# Patient Record
Sex: Male | Born: 1960
Health system: Southern US, Community
[De-identification: ages and names within clinical notes are randomized; demographics above are authoritative.]

## PROBLEM LIST (undated history)

## (undated) DIAGNOSIS — R55 Syncope and collapse: Secondary | ICD-10-CM

## (undated) DIAGNOSIS — I340 Nonrheumatic mitral (valve) insufficiency: Secondary | ICD-10-CM

## (undated) DIAGNOSIS — I4891 Unspecified atrial fibrillation: Secondary | ICD-10-CM

## (undated) DIAGNOSIS — I255 Ischemic cardiomyopathy: Secondary | ICD-10-CM

## (undated) DIAGNOSIS — Z720 Tobacco use: Secondary | ICD-10-CM

## (undated) DIAGNOSIS — I10 Essential (primary) hypertension: Secondary | ICD-10-CM

## (undated) DIAGNOSIS — I119 Hypertensive heart disease without heart failure: Secondary | ICD-10-CM

## (undated) DIAGNOSIS — K219 Gastro-esophageal reflux disease without esophagitis: Secondary | ICD-10-CM

## (undated) DIAGNOSIS — E118 Type 2 diabetes mellitus with unspecified complications: Secondary | ICD-10-CM

## (undated) DIAGNOSIS — E785 Hyperlipidemia, unspecified: Secondary | ICD-10-CM

## (undated) DIAGNOSIS — R51 Headache: Secondary | ICD-10-CM

## (undated) DIAGNOSIS — I5022 Chronic systolic (congestive) heart failure: Secondary | ICD-10-CM

## (undated) DIAGNOSIS — G4719 Other hypersomnia: Secondary | ICD-10-CM

## (undated) DIAGNOSIS — J9600 Acute respiratory failure, unspecified whether with hypoxia or hypercapnia: Secondary | ICD-10-CM

## (undated) DIAGNOSIS — I214 Non-ST elevation (NSTEMI) myocardial infarction: Secondary | ICD-10-CM

## (undated) DIAGNOSIS — R519 Headache, unspecified: Secondary | ICD-10-CM

## (undated) DIAGNOSIS — I4892 Unspecified atrial flutter: Secondary | ICD-10-CM

## (undated) DIAGNOSIS — J189 Pneumonia, unspecified organism: Secondary | ICD-10-CM

## (undated) DIAGNOSIS — M1990 Unspecified osteoarthritis, unspecified site: Secondary | ICD-10-CM

## (undated) DIAGNOSIS — I2510 Atherosclerotic heart disease of native coronary artery without angina pectoris: Secondary | ICD-10-CM

## (undated) HISTORY — DX: Other hypersomnia: G47.19

## (undated) HISTORY — PX: OTHER SURGICAL HISTORY: SHX169

## (undated) HISTORY — DX: Chronic systolic (congestive) heart failure: I50.22

## (undated) HISTORY — DX: Essential (primary) hypertension: I10

---

## 1999-01-24 ENCOUNTER — Emergency Department (HOSPITAL_COMMUNITY): Admission: EM | Admit: 1999-01-24 | Discharge: 1999-01-24 | Payer: Self-pay | Admitting: Emergency Medicine

## 1999-01-24 ENCOUNTER — Encounter: Payer: Self-pay | Admitting: Emergency Medicine

## 2005-04-06 ENCOUNTER — Ambulatory Visit: Payer: Self-pay | Admitting: Gastroenterology

## 2005-04-09 ENCOUNTER — Ambulatory Visit: Payer: Self-pay | Admitting: Internal Medicine

## 2005-04-17 ENCOUNTER — Emergency Department (HOSPITAL_COMMUNITY): Admission: EM | Admit: 2005-04-17 | Discharge: 2005-04-17 | Payer: Self-pay | Admitting: Emergency Medicine

## 2005-04-19 ENCOUNTER — Ambulatory Visit: Payer: Self-pay | Admitting: Internal Medicine

## 2005-05-04 ENCOUNTER — Ambulatory Visit: Payer: Self-pay | Admitting: Internal Medicine

## 2005-06-17 ENCOUNTER — Ambulatory Visit: Payer: Self-pay | Admitting: Gastroenterology

## 2005-06-17 ENCOUNTER — Ambulatory Visit: Payer: Self-pay | Admitting: Internal Medicine

## 2005-07-09 ENCOUNTER — Ambulatory Visit (HOSPITAL_COMMUNITY): Admission: RE | Admit: 2005-07-09 | Discharge: 2005-07-09 | Payer: Self-pay | Admitting: Gastroenterology

## 2005-07-21 ENCOUNTER — Ambulatory Visit: Payer: Self-pay | Admitting: Internal Medicine

## 2005-08-12 ENCOUNTER — Ambulatory Visit: Payer: Self-pay | Admitting: Gastroenterology

## 2005-09-01 ENCOUNTER — Ambulatory Visit: Payer: Self-pay | Admitting: Internal Medicine

## 2006-02-10 ENCOUNTER — Emergency Department (HOSPITAL_COMMUNITY): Admission: EM | Admit: 2006-02-10 | Discharge: 2006-02-10 | Payer: Self-pay | Admitting: Emergency Medicine

## 2006-02-21 ENCOUNTER — Emergency Department (HOSPITAL_COMMUNITY): Admission: EM | Admit: 2006-02-21 | Discharge: 2006-02-21 | Payer: Self-pay | Admitting: Family Medicine

## 2006-04-26 ENCOUNTER — Emergency Department (HOSPITAL_COMMUNITY): Admission: EM | Admit: 2006-04-26 | Discharge: 2006-04-26 | Payer: Self-pay | Admitting: Family Medicine

## 2006-05-14 ENCOUNTER — Emergency Department (HOSPITAL_COMMUNITY): Admission: EM | Admit: 2006-05-14 | Discharge: 2006-05-14 | Payer: Self-pay | Admitting: Family Medicine

## 2014-11-19 ENCOUNTER — Ambulatory Visit: Payer: Self-pay | Attending: Internal Medicine | Admitting: Internal Medicine

## 2014-11-19 ENCOUNTER — Encounter: Payer: Self-pay | Admitting: Internal Medicine

## 2014-11-19 VITALS — BP 150/90 | HR 100 | Temp 98.0°F | Resp 16 | Wt 200.0 lb

## 2014-11-19 DIAGNOSIS — Z23 Encounter for immunization: Secondary | ICD-10-CM | POA: Insufficient documentation

## 2014-11-19 DIAGNOSIS — F17200 Nicotine dependence, unspecified, uncomplicated: Secondary | ICD-10-CM

## 2014-11-19 DIAGNOSIS — Z720 Tobacco use: Secondary | ICD-10-CM | POA: Insufficient documentation

## 2014-11-19 DIAGNOSIS — IMO0001 Reserved for inherently not codable concepts without codable children: Secondary | ICD-10-CM

## 2014-11-19 DIAGNOSIS — E139 Other specified diabetes mellitus without complications: Secondary | ICD-10-CM

## 2014-11-19 DIAGNOSIS — K219 Gastro-esophageal reflux disease without esophagitis: Secondary | ICD-10-CM | POA: Insufficient documentation

## 2014-11-19 DIAGNOSIS — E785 Hyperlipidemia, unspecified: Secondary | ICD-10-CM

## 2014-11-19 DIAGNOSIS — R030 Elevated blood-pressure reading, without diagnosis of hypertension: Secondary | ICD-10-CM

## 2014-11-19 DIAGNOSIS — I10 Essential (primary) hypertension: Secondary | ICD-10-CM | POA: Insufficient documentation

## 2014-11-19 DIAGNOSIS — E119 Type 2 diabetes mellitus without complications: Secondary | ICD-10-CM | POA: Insufficient documentation

## 2014-11-19 LAB — POCT GLYCOSYLATED HEMOGLOBIN (HGB A1C): Hemoglobin A1C: 8

## 2014-11-19 LAB — GLUCOSE, POCT (MANUAL RESULT ENTRY): POC Glucose: 242 mg/dl — AB (ref 70–99)

## 2014-11-19 MED ORDER — HYDRALAZINE HCL 50 MG PO TABS: 50.0000 mg | ORAL_TABLET | Freq: Three times a day (TID) | ORAL | Status: DC

## 2014-11-19 MED ORDER — TRIAMTERENE-HCTZ 37.5-25 MG PO CAPS: 1.0000 | ORAL_CAPSULE | Freq: Every day | ORAL | Status: DC

## 2014-11-19 MED ORDER — OMEPRAZOLE 40 MG PO CPDR: 40.0000 mg | DELAYED_RELEASE_CAPSULE | Freq: Every day | ORAL | Status: DC

## 2014-11-19 MED ORDER — GLIPIZIDE 5 MG PO TABS: 5.0000 mg | ORAL_TABLET | Freq: Every day | ORAL | Status: DC

## 2014-11-19 MED ORDER — AMLODIPINE BESYLATE 10 MG PO TABS: 10.0000 mg | ORAL_TABLET | Freq: Every day | ORAL | Status: DC

## 2014-11-19 MED ORDER — CLONIDINE HCL 0.1 MG PO TABS
0.2000 mg | ORAL_TABLET | Freq: Once | ORAL | Status: AC
  Administered 2014-11-19: 0.2 mg via ORAL

## 2014-11-19 MED ORDER — LISINOPRIL 40 MG PO TABS: 40.0000 mg | ORAL_TABLET | Freq: Every day | ORAL | Status: DC

## 2014-11-19 NOTE — Progress Notes (Signed)
Patient Demographics  Sean Hinton, is a 54 y.o. male  QIO:[id number]  LKG:[id number]  DOB - 03-05-1961  CC:  Chief Complaint  Patient presents with  . Establish Care       HPI: Sean Hinton is a 54 y.o. male here today to establish medical care.patient has history of hypertension, diabetes, hyperlipidemia, GERD , as per patient is taking Glucotrol 2.5 mg daily, he reported his hemoglobin A1c was 8.8% about a year ago, currently his morbid A1c is 8.0%, he denies any hypoglycemic symptoms, today's blood pressure is elevated since he ran out of his blood pressure medications he reported to be on amlodipine lisinopril Dyazide, as per patient he used to be on clonidine which was switched to hydralazine, he is requesting refill on his medications. He denies any acute symptoms, does smoke cigarettes I have advised patient to quit smoking. Patient has No headache, No chest pain, No abdominal pain - No Nausea, No new weakness tingling or numbness, No Cough - SOB.  No Known Allergies Past Medical History  Diagnosis Date  . Diabetes mellitus without complication   . Hypertension    No current outpatient prescriptions on file prior to visit.   No current facility-administered medications on file prior to visit.   Family History  Problem Relation Age of Onset  . Diabetes Mother   . Hypertension Mother   . Heart disease Mother   . Cancer Mother     stomach cancer   . Diabetes Father   . Hypertension Father   . Heart disease Father   . Diabetes Sister   . Hypertension Sister   . Cancer Sister     breast cancer   . Diabetes Brother   . Hypertension Brother   . Heart disease Brother   . Stroke Brother    History   Social History  . Marital Status: Single    Spouse Name: N/A    Number of Children: N/A  . Years of Education: N/A   Occupational History  . Not on file.   Social History Main Topics  . Smoking status: Current Every Day Smoker -- 0.50 packs/day for 30  years  . Smokeless tobacco: Not on file  . Alcohol Use: No  . Drug Use: No     Comment: last use 1991  . Sexual Activity: Not on file   Other Topics Concern  . Not on file   Social History Narrative  . No narrative on file    Review of Systems: Constitutional: Negative for fever, chills, diaphoresis, activity change, appetite change and fatigue. HENT: Negative for ear pain, nosebleeds, congestion, facial swelling, rhinorrhea, neck pain, neck stiffness and ear discharge.  Eyes: Negative for pain, discharge, redness, itching and visual disturbance. Respiratory: Negative for cough, choking, chest tightness, shortness of breath, wheezing and stridor.  Cardiovascular: Negative for chest pain, palpitations and leg swelling. Gastrointestinal: Negative for abdominal distention. Genitourinary: Negative for dysuria, urgency, frequency, hematuria, flank pain, decreased urine volume, difficulty urinating and dyspareunia.  Musculoskeletal: Negative for back pain, joint swelling, arthralgia and gait problem. Neurological: Negative for dizziness, tremors, seizures, syncope, facial asymmetry, speech difficulty, weakness, light-headedness, numbness and headaches.  Hematological: Negative for adenopathy. Does not bruise/bleed easily. Psychiatric/Behavioral: Negative for hallucinations, behavioral problems, confusion, dysphoric mood, decreased concentration and agitation.    Objective:   Filed Vitals:   11/19/14 1054  BP: 150/90  Pulse:   Temp:   Resp:     Physical Exam: Constitutional: Patient appears well-developed and  well-nourished. No distress. HENT: Normocephalic, atraumatic, External right and left ear normal. Oropharynx is clear and moist.  Eyes: Conjunctivae and EOM are normal. PERRLA, no scleral icterus. Neck: Normal ROM. Neck supple. No JVD. No tracheal deviation. No thyromegaly. CVS: RRR, S1/S2 +, no murmurs, no gallops, no carotid bruit.  Pulmonary: Effort and breath sounds  normal, no stridor, rhonchi, wheezes, rales.  Abdominal: Soft. BS +, no distension, tenderness, rebound or guarding.  Musculoskeletal: Normal range of motion. No edema and no tenderness.  Neuro: Alert. Normal reflexes, muscle tone coordination. No cranial nerve deficit. Skin: Skin is warm and dry. No rash noted. Not diaphoretic. No erythema. No pallor. Psychiatric: Normal mood and affect. Behavior, judgment, thought content normal.  No results found for: WBC, HGB, HCT, MCV, PLT No results found for: CREATININE, BUN, NA, K, CL, CO2  Lab Results  Component Value Date   HGBA1C 8.0 11/19/2014   Lipid Panel  No results found for: CHOL, TRIG, HDL, CHOLHDL, VLDL, LDLCALC     Assessment and plan:   1. Other specified diabetes mellitus without complications Results for orders placed or performed in visit on 11/19/14  Glucose (CBG)  Result Value Ref Range   POC Glucose 242 (A) 70 - 99 mg/dl  HgB W8S  Result Value Ref Range   Hemoglobin A1C 8.0   I have advised patient to increase the dose of Glucotrol to 5 mg daily. Will repeat A1c in 3 months - Ambulatory referral to Ophthalmology - glipiZIDE (GLUCOTROL) 5 MG tablet; Take 1 tablet (5 mg total) by mouth daily before breakfast.  Dispense: 30 tablet; Refill: 3  2. Elevated blood pressure  - cloNIDine (CATAPRES) tablet 0.2 mg; Take 2 tablets (0.2 mg total) by mouth once. Repeat manual blood pressure is 150/90  3. Essential hypertension Advised patient for DASH diet, resume back on his medications, will Check blood chemistry  - triamterene-hydrochlorothiazide (DYAZIDE) 37.5-25 MG per capsule; Take 1 each (1 capsule total) by mouth daily.  Dispense: 30 capsule; Refill: 3 - lisinopril (PRINIVIL,ZESTRIL) 40 MG tablet; Take 1 tablet (40 mg total) by mouth daily.  Dispense: 30 tablet; Refill: 3 - amLODipine (NORVASC) 10 MG tablet; Take 1 tablet (10 mg total) by mouth daily.  Dispense: 30 tablet; Refill: 3 - hydrALAZINE (APRESOLINE) 50 MG  tablet; Take 1 tablet (50 mg total) by mouth 3 (three) times daily.  Dispense: 60 tablet; Refill: 3  4. Hyperlipidemia As per patient he used to be on Zocor in the past but he reported to that he start taking this medication for 3 months since his cholesterol level was better, will get fasting lipid panel on the next visit.  5. Smoking Advised patient to quit smoking.  6. Gastroesophageal reflux disease, esophagitis presence not specified Lifestyle modification, prescribed - omeprazole (PRILOSEC) 40 MG capsule; Take 1 capsule (40 mg total) by mouth daily.  Dispense: 30 capsule; Refill: 3  7. Need for prophylactic vaccination against Streptococcus pneumoniae (pneumococcus) Pneumovax given today.  Health Maintenance  -Vaccinations:  Patient declines flu shot pneumovax given today   Return in about 3 months (around 02/18/2015) for diabetes, hypertension, hyperipidemia, BP check in 2 weeks/Nurse Visit.   Doris Cheadle, MD

## 2014-11-19 NOTE — Progress Notes (Signed)
Patient here to establish care  Currently taking medication for HTN and Diabetes Presents if office with elevated blood pressure

## 2014-11-19 NOTE — Patient Instructions (Signed)
Diabetes Mellitus and Food It is important for you to manage your blood sugar (glucose) level. Your blood glucose level can be greatly affected by what you eat. Eating healthier foods in the appropriate amounts throughout the day at about the same time each day will help you control your blood glucose level. It can also help slow or prevent worsening of your diabetes mellitus. Healthy eating may even help you improve the level of your blood pressure and reach or maintain a healthy weight.  HOW CAN FOOD AFFECT ME? Carbohydrates Carbohydrates affect your blood glucose level more than any other type of food. Your dietitian will help you determine how many carbohydrates to eat at each meal and teach you how to count carbohydrates. Counting carbohydrates is important to keep your blood glucose at a healthy level, especially if you are using insulin or taking certain medicines for diabetes mellitus. Alcohol Alcohol can cause sudden decreases in blood glucose (hypoglycemia), especially if you use insulin or take certain medicines for diabetes mellitus. Hypoglycemia can be a life-threatening condition. Symptoms of hypoglycemia (sleepiness, dizziness, and disorientation) are similar to symptoms of having too much alcohol.  If your health care provider has given you approval to drink alcohol, do so in moderation and use the following guidelines:  Women should not have more than one drink per day, and men should not have more than two drinks per day. One drink is equal to:  12 oz of beer.  5 oz of wine.  1 oz of hard liquor.  Do not drink on an empty stomach.  Keep yourself hydrated. Have water, diet soda, or unsweetened iced tea.  Regular soda, juice, and other mixers might contain a lot of carbohydrates and should be counted. WHAT FOODS ARE NOT RECOMMENDED? As you make food choices, it is important to remember that all foods are not the same. Some foods have fewer nutrients per serving than other  foods, even though they might have the same number of calories or carbohydrates. It is difficult to get your body what it needs when you eat foods with fewer nutrients. Examples of foods that you should avoid that are high in calories and carbohydrates but low in nutrients include:  Trans fats (most processed foods list trans fats on the Nutrition Facts label).  Regular soda.  Juice.  Candy.  Sweets, such as cake, pie, doughnuts, and cookies.  Fried foods. WHAT FOODS CAN I EAT? Have nutrient-rich foods, which will nourish your body and keep you healthy. The food you should eat also will depend on several factors, including:  The calories you need.  The medicines you take.  Your weight.  Your blood glucose level.  Your blood pressure level.  Your cholesterol level. You also should eat a variety of foods, including:  Protein, such as meat, poultry, fish, tofu, nuts, and seeds (lean animal proteins are best).  Fruits.  Vegetables.  Dairy products, such as milk, cheese, and yogurt (low fat is best).  Breads, grains, pasta, cereal, rice, and beans.  Fats such as olive oil, trans fat-free margarine, canola oil, avocado, and olives. DOES EVERYONE WITH DIABETES MELLITUS HAVE THE SAME MEAL PLAN? Because every person with diabetes mellitus is different, there is not one meal plan that works for everyone. It is very important that you meet with a dietitian who will help you create a meal plan that is just right for you. Document Released: 07/08/2005 Document Revised: 10/16/2013 Document Reviewed: 09/07/2013 ExitCare Patient Information 2015 [employer]. This   information is not intended to replace advice given to you by your health care provider. Make sure you discuss any questions you have with your health care provider. DASH Eating Plan DASH stands for "Dietary Approaches to Stop Hypertension." The DASH eating plan is a healthy eating plan that has been shown to reduce high  blood pressure (hypertension). Additional health benefits may include reducing the risk of type 2 diabetes mellitus, heart disease, and stroke. The DASH eating plan may also help with weight loss. WHAT DO I NEED TO KNOW ABOUT THE DASH EATING PLAN? For the DASH eating plan, you will follow these general guidelines:  Choose foods with a percent daily value for sodium of less than 5% (as listed on the food label).  Use salt-free seasonings or herbs instead of table salt or sea salt.  Check with your health care provider or pharmacist before using salt substitutes.  Eat lower-sodium products, often labeled as "lower sodium" or "no salt added."  Eat fresh foods.  Eat more vegetables, fruits, and low-fat dairy products.  Choose whole grains. Look for the word "whole" as the first word in the ingredient list.  Choose fish and skinless chicken or turkey more often than red meat. Limit fish, poultry, and meat to 6 oz (170 g) each day.  Limit sweets, desserts, sugars, and sugary drinks.  Choose heart-healthy fats.  Limit cheese to 1 oz (28 g) per day.  Eat more home-cooked food and less restaurant, buffet, and fast food.  Limit fried foods.  Cook foods using methods other than frying.  Limit canned vegetables. If you do use them, rinse them well to decrease the sodium.  When eating at a restaurant, ask that your food be prepared with less salt, or no salt if possible. WHAT FOODS CAN I EAT? Seek help from a dietitian for individual calorie needs. Grains Whole grain or whole wheat bread. Brown rice. Whole grain or whole wheat pasta. Quinoa, bulgur, and whole grain cereals. Low-sodium cereals. Corn or whole wheat flour tortillas. Whole grain cornbread. Whole grain crackers. Low-sodium crackers. Vegetables Fresh or frozen vegetables (raw, steamed, roasted, or grilled). Low-sodium or reduced-sodium tomato and vegetable juices. Low-sodium or reduced-sodium tomato sauce and paste. Low-sodium  or reduced-sodium canned vegetables.  Fruits All fresh, canned (in natural juice), or frozen fruits. Meat and Other Protein Products Ground beef (85% or leaner), grass-fed beef, or beef trimmed of fat. Skinless chicken or turkey. Ground chicken or turkey. Pork trimmed of fat. All fish and seafood. Eggs. Dried beans, peas, or lentils. Unsalted nuts and seeds. Unsalted canned beans. Dairy Low-fat dairy products, such as skim or 1% milk, 2% or reduced-fat cheeses, low-fat ricotta or cottage cheese, or plain low-fat yogurt. Low-sodium or reduced-sodium cheeses. Fats and Oils Tub margarines without trans fats. Light or reduced-fat mayonnaise and salad dressings (reduced sodium). Avocado. Safflower, olive, or canola oils. Natural peanut or almond butter. Other Unsalted popcorn and pretzels. The items listed above may not be a complete list of recommended foods or beverages. Contact your dietitian for more options. WHAT FOODS ARE NOT RECOMMENDED? Grains White bread. White pasta. White rice. Refined cornbread. Bagels and croissants. Crackers that contain trans fat. Vegetables Creamed or fried vegetables. Vegetables in a cheese sauce. Regular canned vegetables. Regular canned tomato sauce and paste. Regular tomato and vegetable juices. Fruits Dried fruits. Canned fruit in light or heavy syrup. Fruit juice. Meat and Other Protein Products Fatty cuts of meat. Ribs, chicken wings, bacon, sausage, bologna, salami, chitterlings, fatback, hot   dogs, bratwurst, and packaged luncheon meats. Salted nuts and seeds. Canned beans with salt. Dairy Whole or 2% milk, cream, half-and-half, and cream cheese. Whole-fat or sweetened yogurt. Full-fat cheeses or blue cheese. Nondairy creamers and whipped toppings. Processed cheese, cheese spreads, or cheese curds. Condiments Onion and garlic salt, seasoned salt, table salt, and sea salt. Canned and packaged gravies. Worcestershire sauce. Tartar sauce. Barbecue sauce.  Teriyaki sauce. Soy sauce, including reduced sodium. Steak sauce. Fish sauce. Oyster sauce. Cocktail sauce. Horseradish. Ketchup and mustard. Meat flavorings and tenderizers. Bouillon cubes. Hot sauce. Tabasco sauce. Marinades. Taco seasonings. Relishes. Fats and Oils Butter, stick margarine, lard, shortening, ghee, and bacon fat. Coconut, palm kernel, or palm oils. Regular salad dressings. Other Pickles and olives. Salted popcorn and pretzels. The items listed above may not be a complete list of foods and beverages to avoid. Contact your dietitian for more information. WHERE CAN I FIND MORE INFORMATION? National Heart, Lung, and [employer] Institute: www.nhlbi.nih.gov/health/health-topics/topics/dash/ Document Released: 09/30/2011 Document Revised: 02/25/2014 Document Reviewed: 08/15/2013 ExitCare Patient Information 2015 [employer]. This information is not intended to replace advice given to you by your health care provider. Make sure you discuss any questions you have with your health care provider.  

## 2014-11-25 ENCOUNTER — Telehealth: Payer: Self-pay

## 2014-11-25 ENCOUNTER — Other Ambulatory Visit: Payer: Self-pay

## 2014-11-25 MED ORDER — PANTOPRAZOLE SODIUM 40 MG PO TBEC: 40.0000 mg | DELAYED_RELEASE_TABLET | Freq: Every day | ORAL | Status: DC

## 2014-11-25 NOTE — Telephone Encounter (Signed)
Patient calling to request protonix for his stomach issue States the other medication prescribed is not working [facility]

## 2014-12-19 ENCOUNTER — Emergency Department (HOSPITAL_COMMUNITY)
Admission: EM | Admit: 2014-12-19 | Discharge: 2014-12-19 | Disposition: A | Discharge status: Home / Self Care | Payer: Self-pay | Attending: Emergency Medicine | Admitting: Emergency Medicine

## 2014-12-19 ENCOUNTER — Encounter (HOSPITAL_COMMUNITY): Payer: Self-pay | Admitting: Emergency Medicine

## 2014-12-19 ENCOUNTER — Emergency Department (HOSPITAL_COMMUNITY): Payer: Self-pay

## 2014-12-19 ENCOUNTER — Encounter (HOSPITAL_COMMUNITY): Payer: Self-pay

## 2014-12-19 ENCOUNTER — Emergency Department (INDEPENDENT_AMBULATORY_CARE_PROVIDER_SITE_OTHER)
Admission: EM | Admit: 2014-12-19 | Discharge: 2014-12-19 | Disposition: A | Discharge status: Nursing Home (Includes ICF) | Payer: Self-pay | Source: Home / Self Care | Attending: Family Medicine | Admitting: Family Medicine

## 2014-12-19 ENCOUNTER — Telehealth: Payer: Self-pay | Admitting: Internal Medicine

## 2014-12-19 DIAGNOSIS — Z720 Tobacco use: Secondary | ICD-10-CM | POA: Insufficient documentation

## 2014-12-19 DIAGNOSIS — I10 Essential (primary) hypertension: Secondary | ICD-10-CM | POA: Insufficient documentation

## 2014-12-19 DIAGNOSIS — K5900 Constipation, unspecified: Secondary | ICD-10-CM | POA: Insufficient documentation

## 2014-12-19 DIAGNOSIS — Z7982 Long term (current) use of aspirin: Secondary | ICD-10-CM | POA: Insufficient documentation

## 2014-12-19 DIAGNOSIS — E119 Type 2 diabetes mellitus without complications: Secondary | ICD-10-CM | POA: Insufficient documentation

## 2014-12-19 DIAGNOSIS — R1013 Epigastric pain: Secondary | ICD-10-CM

## 2014-12-19 DIAGNOSIS — R630 Anorexia: Secondary | ICD-10-CM | POA: Insufficient documentation

## 2014-12-19 DIAGNOSIS — K219 Gastro-esophageal reflux disease without esophagitis: Secondary | ICD-10-CM | POA: Insufficient documentation

## 2014-12-19 DIAGNOSIS — Z79899 Other long term (current) drug therapy: Secondary | ICD-10-CM | POA: Insufficient documentation

## 2014-12-19 LAB — URINALYSIS, ROUTINE W REFLEX MICROSCOPIC
Bilirubin Urine: NEGATIVE
Glucose, UA: NEGATIVE mg/dL
Hgb urine dipstick: NEGATIVE
Ketones, ur: 15 mg/dL — AB
Leukocytes, UA: NEGATIVE
Nitrite: NEGATIVE
Protein, ur: 30 mg/dL — AB
Specific Gravity, Urine: 1.027 (ref 1.005–1.030)
UROBILINOGEN UA: 1 mg/dL (ref 0.0–1.0)
pH: 6.5 (ref 5.0–8.0)

## 2014-12-19 LAB — CBC WITH DIFFERENTIAL/PLATELET
Basophils Absolute: 0.1 10*3/uL (ref 0.0–0.1)
Basophils Relative: 1 % (ref 0–1)
EOS PCT: 2 % (ref 0–5)
Eosinophils Absolute: 0.2 10*3/uL (ref 0.0–0.7)
HEMATOCRIT: 45.9 % (ref 39.0–52.0)
Hemoglobin: 15.8 g/dL (ref 13.0–17.0)
LYMPHS PCT: 27 % (ref 12–46)
Lymphs Abs: 2.7 10*3/uL (ref 0.7–4.0)
MCH: 28.5 pg (ref 26.0–34.0)
MCHC: 34.4 g/dL (ref 30.0–36.0)
MCV: 82.7 fL (ref 78.0–100.0)
Monocytes Absolute: 0.7 10*3/uL (ref 0.1–1.0)
Monocytes Relative: 7 % (ref 3–12)
Neutro Abs: 6.6 10*3/uL (ref 1.7–7.7)
Neutrophils Relative %: 63 % (ref 43–77)
PLATELETS: 208 10*3/uL (ref 150–400)
RBC: 5.55 MIL/uL (ref 4.22–5.81)
RDW: 13.4 % (ref 11.5–15.5)
WBC: 10.3 10*3/uL (ref 4.0–10.5)

## 2014-12-19 LAB — COMPREHENSIVE METABOLIC PANEL
ALBUMIN: 4.1 g/dL (ref 3.5–5.2)
ALT: 15 U/L (ref 0–53)
AST: 20 U/L (ref 0–37)
Alkaline Phosphatase: 97 U/L (ref 39–117)
Anion gap: 11 (ref 5–15)
BUN: 16 mg/dL (ref 6–23)
CO2: 22 mmol/L (ref 19–32)
CREATININE: 1.07 mg/dL (ref 0.50–1.35)
Calcium: 9.5 mg/dL (ref 8.4–10.5)
Chloride: 104 mmol/L (ref 96–112)
GFR calc Af Amer: >90 mL/min (ref >90)
GFR calc non Af Amer: 79 mL/min — ABNORMAL LOW (ref >90)
Glucose, Bld: 146 mg/dL — ABNORMAL HIGH (ref 70–99)
Potassium: 3.7 mmol/L (ref 3.5–5.1)
SODIUM: 137 mmol/L (ref 135–145)
Total Bilirubin: 0.9 mg/dL (ref 0.3–1.2)
Total Protein: 7.8 g/dL (ref 6.0–8.3)

## 2014-12-19 LAB — URINE MICROSCOPIC-ADD ON

## 2014-12-19 LAB — LIPASE, BLOOD: Lipase: 24 U/L (ref 11–59)

## 2014-12-19 MED ORDER — GI COCKTAIL ~~LOC~~
30.0000 mL | Freq: Once | ORAL | Status: AC
  Administered 2014-12-19: 30 mL via ORAL
  Filled 2014-12-19: qty 30

## 2014-12-19 MED ORDER — HYDROMORPHONE HCL 1 MG/ML IJ SOLN
1.0000 mg | Freq: Once | INTRAMUSCULAR | Status: AC
Start: 2014-12-19 — End: 2014-12-19
  Administered 2014-12-19: 1 mg via INTRAVENOUS
  Filled 2014-12-19: qty 1

## 2014-12-19 MED ORDER — SUCRALFATE 1 GM/10ML PO SUSP: 1.0000 g | Freq: Three times a day (TID) | ORAL | Status: DC

## 2014-12-19 MED ORDER — SODIUM CHLORIDE 0.9 % IV BOLUS (SEPSIS)
1000.0000 mL | Freq: Once | INTRAVENOUS | Status: AC
  Administered 2014-12-19: 1000 mL via INTRAVENOUS

## 2014-12-19 MED ORDER — ONDANSETRON HCL 4 MG/2ML IJ SOLN
4.0000 mg | Freq: Once | INTRAMUSCULAR | Status: AC
Start: 2014-12-19 — End: 2014-12-19
  Administered 2014-12-19: 4 mg via INTRAVENOUS
  Filled 2014-12-19: qty 2

## 2014-12-19 MED ORDER — IOHEXOL 300 MG/ML  SOLN
100.0000 mL | Freq: Once | INTRAMUSCULAR | Status: AC | PRN
  Administered 2014-12-19: 100 mL via INTRAVENOUS

## 2014-12-19 MED ORDER — HYDROCODONE-ACETAMINOPHEN 5-325 MG PO TABS: 1.0000 | ORAL_TABLET | ORAL | Status: DC | PRN

## 2014-12-19 NOTE — Discharge Instructions (Signed)
Gastroesophageal Reflux Disease, Adult Gastroesophageal reflux disease (GERD) happens when acid from your stomach flows up into the esophagus. When acid comes in contact with the esophagus, the acid causes soreness (inflammation) in the esophagus. Over time, GERD may create small holes (ulcers) in the lining of the esophagus. CAUSES   Increased body weight. This puts pressure on the stomach, making acid rise from the stomach into the esophagus.  Smoking. This increases acid production in the stomach.  Drinking alcohol. This causes decreased pressure in the lower esophageal sphincter (valve or ring of muscle between the esophagus and stomach), allowing acid from the stomach into the esophagus.  Late evening meals and a full stomach. This increases pressure and acid production in the stomach.  A malformed lower esophageal sphincter. Sometimes, no cause is found. SYMPTOMS   Burning pain in the lower part of the mid-chest behind the breastbone and in the mid-stomach area. This may occur twice a week or more often.  Trouble swallowing.  Sore throat.  Dry cough.  Asthma-like symptoms including chest tightness, shortness of breath, or wheezing. DIAGNOSIS  Your caregiver may be able to diagnose GERD based on your symptoms. In some cases, X-rays and other tests may be done to check for complications or to check the condition of your stomach and esophagus. TREATMENT  Your caregiver may recommend over-the-counter or prescription medicines to help decrease acid production. Ask your caregiver before starting or adding any new medicines.  HOME CARE INSTRUCTIONS   Change the factors that you can control. Ask your caregiver for guidance concerning weight loss, quitting smoking, and alcohol consumption.  Avoid foods and drinks that make your symptoms worse, such as:  Caffeine or alcoholic drinks.  Chocolate.  Peppermint or mint flavorings.  Garlic and onions.  Spicy foods.  Citrus fruits,  such as oranges, lemons, or limes.  Tomato-based foods such as sauce, chili, salsa, and pizza.  Fried and fatty foods.  Avoid lying down for the 3 hours prior to your bedtime or prior to taking a nap.  Eat small, frequent meals instead of large meals.  Wear loose-fitting clothing. Do not wear anything tight around your waist that causes pressure on your stomach.  Raise the head of your bed 6 to 8 inches with wood blocks to help you sleep. Extra pillows will not help.  Only take over-the-counter or prescription medicines for pain, discomfort, or fever as directed by your caregiver.  Do not take aspirin, ibuprofen, or other nonsteroidal anti-inflammatory drugs (NSAIDs). SEEK IMMEDIATE MEDICAL CARE IF:   You have pain in your arms, neck, jaw, teeth, or back.  Your pain increases or changes in intensity or duration.  You develop nausea, vomiting, or sweating (diaphoresis).  You develop shortness of breath, or you faint.  Your vomit is green, yellow, black, or looks like coffee grounds or blood.  Your stool is red, bloody, or black. These symptoms could be signs of other problems, such as heart disease, gastric bleeding, or esophageal bleeding. MAKE SURE YOU:   Understand these instructions.  Will watch your condition.  Will get help right away if you are not doing well or get worse. Document Released: 07/21/2005 Document Revised: 01/03/2012 Document Reviewed: 04/30/2011 ExitCare Patient Information 2015 [employer]. This information is not intended to replace advice given to you by your health care provider. Make sure you discuss any questions you have with your health care provider.  

## 2014-12-19 NOTE — ED Provider Notes (Signed)
Sean Hinton is a 54 y.o. male who presents to Urgent Care today for epigastric abdominal pain. Patient has a history of gastritis confirmed with upper endoscopy in [place]. Over the past 2-3 months he has noted worsening epigastric abdominal pain. He describes a dull squeezing sensation in his epigastric abdominal area. The pain is been worsening over the past 2 weeks or so. However today the pain became exquisitely painful after eating a meal. He notes severe pain. He denies any chest pain or palpitations or shortness of breath. He takes both pantoprazole, and omeprazole. He's also been taking Aleve, Tylenol, and ibuprofen for pain recently.   Past Medical History  Diagnosis Date  . Diabetes mellitus without complication   . Hypertension    Past Surgical History  Procedure Laterality Date  . Right thumb surgery      History  Substance Use Topics  . Smoking status: Current Every Day Smoker -- 0.50 packs/day for 30 years  . Smokeless tobacco: Not on file  . Alcohol Use: No   ROS as above Medications: No current facility-administered medications for this encounter.   Current Outpatient Prescriptions  Medication Sig Dispense Refill  . amLODipine (NORVASC) 10 MG tablet Take 1 tablet (10 mg total) by mouth daily. 30 tablet 3  . aspirin 81 MG tablet Take 81 mg by mouth daily.    . cloNIDine (CATAPRES) 0.3 MG tablet Take 0.3 mg by mouth 3 (three) times daily.    Marland Kitchen glipiZIDE (GLUCOTROL) 5 MG tablet Take 1 tablet (5 mg total) by mouth daily before breakfast. 30 tablet 3  . lisinopril (PRINIVIL,ZESTRIL) 40 MG tablet Take 1 tablet (40 mg total) by mouth daily. 30 tablet 3  . omeprazole (PRILOSEC) 40 MG capsule Take 1 capsule (40 mg total) by mouth daily. 30 capsule 3  . pantoprazole (PROTONIX) 40 MG tablet Take 1 tablet (40 mg total) by mouth daily. 30 tablet 3  . triamterene-hydrochlorothiazide (DYAZIDE) 37.5-25 MG per capsule Take 1 each (1 capsule total) by mouth daily. 30 capsule 3  .  hydrALAZINE (APRESOLINE) 50 MG tablet Take 1 tablet (50 mg total) by mouth 3 (three) times daily. 60 tablet 3   No Known Allergies   Exam:  BP 176/105 mmHg  Pulse 86  Temp(Src) 98.2 F (36.8 C) (Oral)  Resp 18  SpO2 97% Gen: Well NAD HEENT: EOMI,  MMM Lungs: Normal work of breathing. CTABL Heart: RRR no MRG Abd: NABS, hypoactive bowel sounds. Significantly tender in his epigastric area. Pain radiates to his epigastric area with palpation in the inferior portion of his abdomen. He also displays guarding and rebound. Exts: Brisk capillary refill, warm and well perfused.   Twelve-lead EKG: Normal sinus rhythm at 82 bpm. No significant Q waves. Leads II, III, aVF with 75mm ST depression. No ST elevation. Inverted T waves in the lateral leads.  QTc 406.   No results found for this or any previous visit (from the past 24 hour(s)). No results found.  Assessment and Plan: 54 y.o. male with severe acute on chronic abdominal pain. This is concerning for perforated gastric or duodenal ulcer.  Differential includes pancreatitis, gastritis, or gallstone and cardiac etiologies. Transfer to the emergency department for evaluation and management.  Patient declines EMS transport.  Discussed warning signs or symptoms. Please see discharge instructions. Patient expresses understanding.     Rodolph Bong, MD 12/19/14 [phone]

## 2014-12-19 NOTE — ED Notes (Signed)
Pt c/o epigastric pain that has been present for several months and has progressively gotten worse.  Pt sent from [facility] for r/o perforated ulcer.

## 2014-12-19 NOTE — Telephone Encounter (Signed)
Patients girlfriend called to speak to nurse about patients stomach pain. She states that the patient is not eating, and is not having regular bowel movements. She also states that he has started vomiting. Please f/u with pt.

## 2014-12-19 NOTE — ED Notes (Signed)
C/o epigastric pain.  On 1/116.  Symptoms gradually getting worse.  No relief with otc pain meds or prescribed meds omeprazole or protonix.

## 2014-12-19 NOTE — ED Provider Notes (Signed)
Point of epigastric pain for several months. Worse with eating. He vomited one time yesterday. He admits to flecks of red blood mixed with stool last time a few days ago. On exam alert nontoxic Glasgow Coma Score 15 HEENT exam extremities moist pink not icteric neck supple trachea midline lungs clear auscultation heart regular rate and rhythm abdomen nondistended normal active bowel sounds tender at epigastrium no guarding rigidity or rebound  Doug Sou, MD 12/19/14 2048

## 2014-12-19 NOTE — ED Notes (Signed)
Patient transported to CT 

## 2014-12-19 NOTE — ED Provider Notes (Signed)
CSN: [id number]     Arrival date & time 12/19/14  1429 History   First MD Initiated Contact with Patient 12/19/14 1605     Chief Complaint  Patient presents with  . Abdominal Pain     (Consider location/radiation/quality/duration/timing/severity/associated sxs/prior Treatment) HPI Comments: 54 yo M hx of HTN, DMII, gastric ulcers, presents with CC abdominal pain.    Patient is a 53 y.o. male presenting with abdominal pain. The history is provided by the patient. No language interpreter was used.  Abdominal Pain Pain location:  Epigastric Pain quality: aching and sharp   Pain radiates to:  Does not radiate Pain severity:  Moderate Onset quality:  Gradual Timing:  Constant Progression:  Worsening Chronicity:  Recurrent Context: eating   Context: not alcohol use, not diet changes, not previous surgeries, not recent illness, not recent travel, not retching, not sick contacts, not suspicious food intake and not trauma   Relieved by:  Nothing Worsened by:  Eating Ineffective treatments:  NSAIDs (PPI) Associated symptoms: anorexia, constipation and nausea   Associated symptoms: no chest pain, no chills, no cough, no diarrhea, no dysuria, no fever, no hematemesis, no hematochezia, no hematuria, no melena, no shortness of breath and no vomiting   Nausea:    Severity:  Moderate   Onset quality:  Gradual   Duration:  3 days   Timing:  Intermittent   Progression:  Unchanged Risk factors: NSAID use     Past Medical History  Diagnosis Date  . Diabetes mellitus without complication   . Hypertension    Past Surgical History  Procedure Laterality Date  . Right thumb surgery      Family History  Problem Relation Age of Onset  . Diabetes Mother   . Hypertension Mother   . Heart disease Mother   . Cancer Mother     stomach cancer   . Diabetes Father   . Hypertension Father   . Heart disease Father   . Diabetes Sister   . Hypertension Sister   . Cancer Sister     breast cancer    . Diabetes Brother   . Hypertension Brother   . Heart disease Brother   . Stroke Brother    History  Substance Use Topics  . Smoking status: Current Every Day Smoker -- 0.50 packs/day for 30 years  . Smokeless tobacco: Not on file  . Alcohol Use: No    Review of Systems  Constitutional: Negative for fever and chills.  Respiratory: Negative for cough and shortness of breath.   Cardiovascular: Negative for chest pain.  Gastrointestinal: Positive for nausea, abdominal pain, constipation and anorexia. Negative for vomiting, diarrhea, melena, hematochezia, abdominal distention and hematemesis.  Genitourinary: Negative for dysuria and hematuria.  Musculoskeletal: Negative for myalgias.  Skin: Negative for rash.  Neurological: Negative for dizziness, weakness, light-headedness, numbness and headaches.  Hematological: Negative for adenopathy. Does not bruise/bleed easily.  All other systems reviewed and are negative.     Allergies  Review of patient's allergies indicates no known allergies.  Home Medications   Prior to Admission medications   Medication Sig Start Date End Date Taking? Authorizing Provider  amLODipine (NORVASC) 10 MG tablet Take 1 tablet (10 mg total) by mouth daily. 11/19/14  Yes Doris Cheadle, MD  aspirin 81 MG tablet Take 81 mg by mouth daily.   Yes Historical Provider, MD  glipiZIDE (GLUCOTROL) 5 MG tablet Take 1 tablet (5 mg total) by mouth daily before breakfast. 11/19/14  Yes Doris Cheadle, MD  hydrALAZINE (APRESOLINE) 50 MG tablet Take 1 tablet (50 mg total) by mouth 3 (three) times daily. Patient taking differently: Take 50 mg by mouth 2 (two) times daily.  11/19/14  Yes Doris Cheadle, MD  lisinopril (PRINIVIL,ZESTRIL) 40 MG tablet Take 1 tablet (40 mg total) by mouth daily. 11/19/14  Yes Doris Cheadle, MD  omeprazole (PRILOSEC) 40 MG capsule Take 1 capsule (40 mg total) by mouth daily. 11/19/14  Yes Deepak Advani, MD  pantoprazole (PROTONIX) 40 MG tablet Take  1 tablet (40 mg total) by mouth daily. 11/25/14  Yes Doris Cheadle, MD  triamterene-hydrochlorothiazide (DYAZIDE) 37.5-25 MG per capsule Take 1 each (1 capsule total) by mouth daily. 11/19/14  Yes Deepak Advani, MD   BP 177/114 mmHg  Pulse 89  Temp(Src) 98.3 F (36.8 C) (Oral)  Resp 20  Wt 184 lb (83.462 kg)  SpO2 97% Physical Exam  Constitutional: He is oriented to person, place, and time. He appears well-developed and well-nourished.  HENT:  Head: Normocephalic and atraumatic.  Right Ear: External ear normal.  Left Ear: External ear normal.  Mouth/Throat: Oropharynx is clear and moist.  Eyes: Conjunctivae and EOM are normal. Pupils are equal, round, and reactive to light.  Neck: Normal range of motion. Neck supple.  Cardiovascular: Normal rate, regular rhythm, normal heart sounds and intact distal pulses.   Pulmonary/Chest: Effort normal and breath sounds normal. No respiratory distress. He has no wheezes. He has no rales. He exhibits no tenderness.  Abdominal: Soft. Bowel sounds are normal. He exhibits no distension and no mass. There is tenderness. There is no rebound and no guarding.  Epigastric TTP, with some generalized TTP as well, soft, no guarding, no rebound  Musculoskeletal: Normal range of motion.  Neurological: He is alert and oriented to person, place, and time.  Skin: Skin is warm and dry.  Nursing note and vitals reviewed.   ED Course  Procedures (including critical care time) Labs Review Labs Reviewed  COMPREHENSIVE METABOLIC PANEL - Abnormal; Notable for the following:    Glucose, Bld 146 (*)    GFR calc non Af Amer 79 (*)    All other components within normal limits  URINALYSIS, ROUTINE W REFLEX MICROSCOPIC - Abnormal; Notable for the following:    Ketones, ur 15 (*)    Protein, ur 30 (*)    All other components within normal limits  CBC WITH DIFFERENTIAL/PLATELET  LIPASE, BLOOD  URINE MICROSCOPIC-ADD ON    Imaging Review Ct Abdomen Pelvis W  Contrast  12/19/2014   CLINICAL DATA:  Upper mid abdominal pain and right upper quadrant pain since the beginning of the year. Nausea and vomiting yesterday. No bowel movement since Tuesday.  EXAM: CT ABDOMEN AND PELVIS WITH CONTRAST  TECHNIQUE: Multidetector CT imaging of the abdomen and pelvis was performed using the standard protocol following bolus administration of intravenous contrast.  CONTRAST:  OMNIPAQUE IOHEXOL 300 MG/ML  SOLN  COMPARISON:  MRI abdomen 07/09/2005  FINDINGS: Motion artifact in the lung bases.  Mild dependent atelectasis.  Liver, spleen, gallbladder, adrenal glands, kidneys, abdominal aorta, inferior vena cava, and retroperitoneal lymph nodes are unremarkable. There is slight hazy infiltration around the celiac axis and hit/ body of the pancreas. This could represent early changes of acute pancreatitis. Correlation with laboratory markers is recommended. No pancreatic ductal dilatation. No pancreatic mass appreciated. Stomach, small bowel, and colon are not abnormally distended. Stool fills the colon. No free air or free fluid in the abdomen. Abdominal wall musculature appears intact.  Pelvis: Appendix is normal. Prostate gland is enlarged. Bladder wall is thickened, possibly due to infection or under distention. No free or loculated pelvic fluid collections. No pelvic mass or lymphadenopathy. Mild degenerative changes in the spine and hips. No destructive bone lesions.  IMPRESSION: Suggestion of infiltration around the head of the pancreas which may represent early changes of pancreatitis. Correlation with laboratory markers recommended. No evidence of bowel obstruction.   Electronically Signed   By: Burman Nieves M.D.   On: 12/19/2014 18:57     EKG Interpretation None      MDM   Final diagnoses:  Gastroesophageal reflux disease, esophagitis presence not specified   54 yo M hx of HTN, DMII, gastric ulcers, presents with CC abdominal pain.  Pt seen at [facility] and there was  concern for perforated gastric ulcer given pt's history, and worsening abdominal pain.    Physical exam as above.  VS WNL.  Pt with mostly epigastric TTP, with some diffuse tenderness as well, but abdomen is not peritonitic.  Labs demonstrate CBC, CMP, Lipase, UA all WNL.    CT abdomen/pelvis demonstrates some infiltration around head of pancreas which may represent early changes of pancreatitis.  However lipase is WNL, thus unlikely.   Diagnosis of epigastric pain, likely 2/2 GERD.   D/c home in good condition.  Rx carafate, Norco.  Recommended that pt d/c NSAIDs.  F/u with PCP in 1 week, and GI for further evaluation.  Return precautions given.  Pt understands and agrees with plan.   Jon Gills  Discussed pt with my attending Dr. Ethelda Chick.     Jon Gills, MD 12/20/14 1601  Doug Sou, MD 12/21/14 [phone]

## 2014-12-23 ENCOUNTER — Telehealth: Payer: Self-pay

## 2014-12-23 DIAGNOSIS — R109 Unspecified abdominal pain: Secondary | ICD-10-CM

## 2014-12-23 DIAGNOSIS — K219 Gastro-esophageal reflux disease without esophagitis: Secondary | ICD-10-CM

## 2014-12-23 MED ORDER — RANITIDINE HCL 150 MG PO TABS: 150.0000 mg | ORAL_TABLET | Freq: Two times a day (BID) | ORAL | Status: DC

## 2014-12-23 NOTE — Telephone Encounter (Signed)
dexilant can be ordered but it is not on our formulary in the pharmacy. If patient has a Probation officer it can be sent there and Prilosec removed from med list but he will have to pay out of pocket,  No way for [place] to know how much.   I advise patient try zantac 150 mg BID, sent to on site pharmacy. $4.  D/cd prilosec Quit smoking which worsens GERD.

## 2014-12-23 NOTE — Telephone Encounter (Signed)
Returned patient phone call Patient states was seen in the ED and it was recommended that he see A GI doctor for his stomach issues Referral to GI placed in epic

## 2014-12-23 NOTE — Telephone Encounter (Signed)
Patient states the protonix is not helping  Patient is requesting a prescription for dexalant Is this something we can prescribe

## 2014-12-24 ENCOUNTER — Telehealth: Payer: Self-pay | Admitting: Internal Medicine

## 2014-12-24 DIAGNOSIS — K219 Gastro-esophageal reflux disease without esophagitis: Secondary | ICD-10-CM

## 2014-12-24 MED ORDER — DEXLANSOPRAZOLE 30 MG PO CPDR: 30.0000 mg | DELAYED_RELEASE_CAPSULE | Freq: Every day | ORAL | Status: DC

## 2014-12-24 NOTE — Telephone Encounter (Signed)
Ordered dexilant rx printed bc it is not on formulary here and patient will need to get it from a different pharmacy. Francena Hanly, please call the pharmacy to cancel Protonix and zantac Rx.

## 2014-12-24 NOTE — Telephone Encounter (Signed)
Pt had a lot of nausea last night and vomited due to his condition. He is wondering if he could try a different medication that is not Zantac... Zantac was not helpful in the past but will try it once again. In the future he would like to be prescribed dexilant. Please call for advise about his nausea.

## 2014-12-24 NOTE — Addendum Note (Signed)
Addended by: Dessa Phi on: 12/24/2014 05:14 PM   Modules accepted: Orders

## 2014-12-25 NOTE — Telephone Encounter (Signed)
Pt aware of Rx need to be pick up Protonix and Zantac cancelled

## 2014-12-26 ENCOUNTER — Ambulatory Visit: Payer: Self-pay | Attending: Internal Medicine

## 2014-12-26 ENCOUNTER — Telehealth: Payer: Self-pay

## 2014-12-26 NOTE — Telephone Encounter (Signed)
Returned patient phone call Patient already received a call from the pharmacy And call was satisfied

## 2014-12-30 ENCOUNTER — Other Ambulatory Visit: Payer: Self-pay | Admitting: *Deleted

## 2014-12-30 DIAGNOSIS — K219 Gastro-esophageal reflux disease without esophagitis: Secondary | ICD-10-CM

## 2014-12-30 MED ORDER — DEXLANSOPRAZOLE 30 MG PO CPDR: 30.0000 mg | DELAYED_RELEASE_CAPSULE | Freq: Every day | ORAL | Status: AC

## 2015-01-17 ENCOUNTER — Ambulatory Visit: Payer: Self-pay | Attending: Internal Medicine

## 2015-02-20 ENCOUNTER — Encounter (HOSPITAL_COMMUNITY): Payer: Self-pay | Admitting: Emergency Medicine

## 2015-02-20 ENCOUNTER — Emergency Department (HOSPITAL_COMMUNITY): Payer: Self-pay

## 2015-02-20 ENCOUNTER — Inpatient Hospital Stay (HOSPITAL_COMMUNITY)
Admission: EM | Admit: 2015-02-20 | Discharge: 2015-02-25 | DRG: 287 | Disposition: A | Discharge status: Home / Self Care | Payer: No Typology Code available for payment source | Attending: Cardiovascular Disease | Admitting: Cardiovascular Disease

## 2015-02-20 DIAGNOSIS — I340 Nonrheumatic mitral (valve) insufficiency: Secondary | ICD-10-CM | POA: Diagnosis present

## 2015-02-20 DIAGNOSIS — Z720 Tobacco use: Secondary | ICD-10-CM

## 2015-02-20 DIAGNOSIS — E119 Type 2 diabetes mellitus without complications: Secondary | ICD-10-CM

## 2015-02-20 DIAGNOSIS — I2510 Atherosclerotic heart disease of native coronary artery without angina pectoris: Secondary | ICD-10-CM | POA: Diagnosis present

## 2015-02-20 DIAGNOSIS — Z833 Family history of diabetes mellitus: Secondary | ICD-10-CM

## 2015-02-20 DIAGNOSIS — I4892 Unspecified atrial flutter: Secondary | ICD-10-CM | POA: Diagnosis present

## 2015-02-20 DIAGNOSIS — I480 Paroxysmal atrial fibrillation: Secondary | ICD-10-CM | POA: Diagnosis present

## 2015-02-20 DIAGNOSIS — E785 Hyperlipidemia, unspecified: Secondary | ICD-10-CM | POA: Diagnosis present

## 2015-02-20 DIAGNOSIS — I255 Ischemic cardiomyopathy: Secondary | ICD-10-CM

## 2015-02-20 DIAGNOSIS — E1165 Type 2 diabetes mellitus with hyperglycemia: Secondary | ICD-10-CM | POA: Diagnosis present

## 2015-02-20 DIAGNOSIS — I119 Hypertensive heart disease without heart failure: Secondary | ICD-10-CM | POA: Diagnosis present

## 2015-02-20 DIAGNOSIS — E139 Other specified diabetes mellitus without complications: Secondary | ICD-10-CM

## 2015-02-20 DIAGNOSIS — I10 Essential (primary) hypertension: Secondary | ICD-10-CM | POA: Diagnosis present

## 2015-02-20 DIAGNOSIS — Z7982 Long term (current) use of aspirin: Secondary | ICD-10-CM

## 2015-02-20 DIAGNOSIS — I4891 Unspecified atrial fibrillation: Secondary | ICD-10-CM

## 2015-02-20 DIAGNOSIS — R55 Syncope and collapse: Secondary | ICD-10-CM

## 2015-02-20 DIAGNOSIS — I2582 Chronic total occlusion of coronary artery: Secondary | ICD-10-CM | POA: Diagnosis present

## 2015-02-20 DIAGNOSIS — Z79899 Other long term (current) drug therapy: Secondary | ICD-10-CM

## 2015-02-20 DIAGNOSIS — F17210 Nicotine dependence, cigarettes, uncomplicated: Secondary | ICD-10-CM | POA: Diagnosis present

## 2015-02-20 HISTORY — DX: Nonrheumatic mitral (valve) insufficiency: I34.0

## 2015-02-20 HISTORY — DX: Ischemic cardiomyopathy: I25.5

## 2015-02-20 HISTORY — DX: Unspecified atrial fibrillation: I48.91

## 2015-02-20 HISTORY — DX: Type 2 diabetes mellitus with unspecified complications: E11.8

## 2015-02-20 HISTORY — DX: Syncope and collapse: R55

## 2015-02-20 HISTORY — DX: Unspecified atrial flutter: I48.92

## 2015-02-20 HISTORY — DX: Hyperlipidemia, unspecified: E78.5

## 2015-02-20 HISTORY — DX: Tobacco use: Z72.0

## 2015-02-20 HISTORY — DX: Atherosclerotic heart disease of native coronary artery without angina pectoris: I25.10

## 2015-02-20 HISTORY — DX: Hypertensive heart disease without heart failure: I11.9

## 2015-02-20 LAB — CBC WITH DIFFERENTIAL/PLATELET
BASOS PCT: 1 % (ref 0–1)
Basophils Absolute: 0 10*3/uL (ref 0.0–0.1)
Eosinophils Absolute: 0.1 10*3/uL (ref 0.0–0.7)
Eosinophils Relative: 1 % (ref 0–5)
HEMATOCRIT: 45.1 % (ref 39.0–52.0)
Hemoglobin: 15.9 g/dL (ref 13.0–17.0)
LYMPHS ABS: 2.8 10*3/uL (ref 0.7–4.0)
LYMPHS PCT: 32 % (ref 12–46)
MCH: 29.3 pg (ref 26.0–34.0)
MCHC: 35.3 g/dL (ref 30.0–36.0)
MCV: 83.1 fL (ref 78.0–100.0)
MONO ABS: 0.7 10*3/uL (ref 0.1–1.0)
MONOS PCT: 8 % (ref 3–12)
NEUTROS PCT: 58 % (ref 43–77)
Neutro Abs: 5 10*3/uL (ref 1.7–7.7)
Platelets: 333 10*3/uL (ref 150–400)
RBC: 5.43 MIL/uL (ref 4.22–5.81)
RDW: 14.7 % (ref 11.5–15.5)
WBC: 8.6 10*3/uL (ref 4.0–10.5)

## 2015-02-20 LAB — I-STAT CHEM 8, ED
BUN: 18 mg/dL (ref 6–23)
CALCIUM ION: 1.15 mmol/L (ref 1.12–1.23)
CHLORIDE: 105 mmol/L (ref 96–112)
Creatinine, Ser: 1 mg/dL (ref 0.50–1.35)
Glucose, Bld: 322 mg/dL — ABNORMAL HIGH (ref 70–99)
HEMATOCRIT: 48 % (ref 39.0–52.0)
Hemoglobin: 16.3 g/dL (ref 13.0–17.0)
Potassium: 3.5 mmol/L (ref 3.5–5.1)
SODIUM: 143 mmol/L (ref 135–145)
TCO2: 21 mmol/L (ref 0–100)

## 2015-02-20 LAB — BASIC METABOLIC PANEL
Anion gap: 8 (ref 5–15)
BUN: 14 mg/dL (ref 6–23)
CO2: 23 mmol/L (ref 19–32)
CREATININE: 1.2 mg/dL (ref 0.50–1.35)
Calcium: 8.7 mg/dL (ref 8.4–10.5)
Chloride: 110 mmol/L (ref 96–112)
GFR, EST AFRICAN AMERICAN: 79 mL/min — AB (ref >90)
GFR, EST NON AFRICAN AMERICAN: 68 mL/min — AB (ref >90)
GLUCOSE: 167 mg/dL — AB (ref 70–99)
Potassium: 3.6 mmol/L (ref 3.5–5.1)
Sodium: 141 mmol/L (ref 135–145)

## 2015-02-20 LAB — TROPONIN I
Troponin I: 0.06 ng/mL — ABNORMAL HIGH (ref <0.031)
Troponin I: 0.06 ng/mL — ABNORMAL HIGH (ref <0.031)

## 2015-02-20 LAB — POTASSIUM: Potassium: 3.6 mmol/L (ref 3.5–5.1)

## 2015-02-20 LAB — GLUCOSE, CAPILLARY: GLUCOSE-CAPILLARY: 149 mg/dL — AB (ref 70–99)

## 2015-02-20 LAB — I-STAT TROPONIN, ED: TROPONIN I, POC: 0.04 ng/mL (ref 0.00–0.08)

## 2015-02-20 LAB — MAGNESIUM: Magnesium: 2 mg/dL (ref 1.5–2.5)

## 2015-02-20 MED ORDER — ACETAMINOPHEN 325 MG PO TABS
650.0000 mg | ORAL_TABLET | ORAL | Status: DC | PRN
  Administered 2015-02-21: 650 mg via ORAL
  Filled 2015-02-20: qty 2

## 2015-02-20 MED ORDER — SODIUM BICARBONATE 8.4 % IV SOLN
50.0000 meq | Freq: Once | INTRAVENOUS | Status: AC
  Administered 2015-02-20: 50 meq via INTRAVENOUS
  Filled 2015-02-20: qty 50

## 2015-02-20 MED ORDER — ADENOSINE 6 MG/2ML IV SOLN
12.0000 mg | Freq: Once | INTRAVENOUS | Status: AC
Start: 2015-02-20 — End: 2015-02-20
  Administered 2015-02-20: 12 mg via INTRAVENOUS

## 2015-02-20 MED ORDER — OFF THE BEAT BOOK
Freq: Once | Status: AC
  Administered 2015-02-21
  Filled 2015-02-20: qty 1

## 2015-02-20 MED ORDER — RIVAROXABAN 20 MG PO TABS
20.0000 mg | ORAL_TABLET | Freq: Every day | ORAL | Status: DC
  Administered 2015-02-20: 20 mg via ORAL
  Filled 2015-02-20: qty 1

## 2015-02-20 MED ORDER — DILTIAZEM LOAD VIA INFUSION
20.0000 mg | Freq: Once | INTRAVENOUS | Status: AC
  Administered 2015-02-20: 20 mg via INTRAVENOUS

## 2015-02-20 MED ORDER — SODIUM CHLORIDE 0.9 % IV BOLUS (SEPSIS)
1000.0000 mL | Freq: Once | INTRAVENOUS | Status: AC
  Administered 2015-02-20: 1000 mL via INTRAVENOUS

## 2015-02-20 MED ORDER — TRIAMTERENE-HCTZ 37.5-25 MG PO CAPS
1.0000 | ORAL_CAPSULE | Freq: Every day | ORAL | Status: DC
  Administered 2015-02-21 – 2015-02-23 (×2): 1 via ORAL
  Filled 2015-02-20 (×5): qty 1

## 2015-02-20 MED ORDER — ADENOSINE 6 MG/2ML IV SOLN
INTRAVENOUS | Status: AC
  Filled 2015-02-20: qty 8

## 2015-02-20 MED ORDER — INSULIN ASPART 100 UNIT/ML ~~LOC~~ SOLN
5.0000 [IU] | Freq: Once | SUBCUTANEOUS | Status: AC
  Administered 2015-02-20: 5 [IU] via INTRAVENOUS
  Filled 2015-02-20: qty 1

## 2015-02-20 MED ORDER — DILTIAZEM HCL 100 MG IV SOLR
5.0000 mg/h | INTRAVENOUS | Status: DC
  Administered 2015-02-20: 5 mg/h via INTRAVENOUS

## 2015-02-20 MED ORDER — HYDRALAZINE HCL 50 MG PO TABS
50.0000 mg | ORAL_TABLET | Freq: Three times a day (TID) | ORAL | Status: DC
  Administered 2015-02-20 – 2015-02-21 (×4): 50 mg via ORAL
  Filled 2015-02-20 (×4): qty 1

## 2015-02-20 MED ORDER — LISINOPRIL 40 MG PO TABS
40.0000 mg | ORAL_TABLET | Freq: Every day | ORAL | Status: DC
  Administered 2015-02-21 – 2015-02-25 (×5): 40 mg via ORAL
  Filled 2015-02-20 (×5): qty 1

## 2015-02-20 MED ORDER — DEXTROSE 50 % IV SOLN
1.0000 | Freq: Once | INTRAVENOUS | Status: AC
  Administered 2015-02-20: 50 mL via INTRAVENOUS
  Filled 2015-02-20: qty 50

## 2015-02-20 MED ORDER — ONDANSETRON HCL 4 MG/2ML IJ SOLN: 4.0000 mg | Freq: Four times a day (QID) | INTRAMUSCULAR | Status: DC | PRN

## 2015-02-20 MED ORDER — DILTIAZEM HCL 60 MG PO TABS
60.0000 mg | ORAL_TABLET | Freq: Four times a day (QID) | ORAL | Status: DC
  Administered 2015-02-20 – 2015-02-21 (×4): 60 mg via ORAL
  Filled 2015-02-20 (×4): qty 1

## 2015-02-20 MED ORDER — GLIPIZIDE 5 MG PO TABS
5.0000 mg | ORAL_TABLET | Freq: Every day | ORAL | Status: DC
  Administered 2015-02-21 – 2015-02-22 (×2): 5 mg via ORAL
  Filled 2015-02-20 (×2): qty 1

## 2015-02-20 NOTE — ED Provider Notes (Signed)
CSN: [id number]     Arrival date & time 02/20/15  1402 History   First MD Initiated Contact with Patient 02/20/15 1406     No chief complaint on file.    The history is provided by the patient. No language interpreter was used.   Mr. Sean Hinton presents for evaluation of syncope.  He was at work this afternoon and felt flushed after lunch and syncopized.  He hit his upper back when he passed out.  No head injury.  He was unconscious for less than 30 seconds.  EMS found him to be in a rapid heart rate.  He received adenosine 6mg , 12mg  with no change in his rhythm.  He then received cardizem 20 mg and his rate improved to 84 transiently only to increase again.  He has no similar sxs previously.  He denies any chest pain, SOB, diaphoresis, nausea, vomiting.   Past Medical History  Diagnosis Date  . Diabetes mellitus without complication   . Hypertension    Past Surgical History  Procedure Laterality Date  . Right thumb surgery      Family History  Problem Relation Age of Onset  . Diabetes Mother   . Hypertension Mother   . Heart disease Mother   . Cancer Mother     stomach cancer   . Diabetes Father   . Hypertension Father   . Heart disease Father   . Diabetes Sister   . Hypertension Sister   . Cancer Sister     breast cancer   . Diabetes Brother   . Hypertension Brother   . Heart disease Brother   . Stroke Brother    History  Substance Use Topics  . Smoking status: Current Every Day Smoker -- 0.50 packs/day for 30 years  . Smokeless tobacco: Not on file  . Alcohol Use: No    Review of Systems  All other systems reviewed and are negative.     Allergies  Review of patient's allergies indicates no known allergies.  Home Medications   Prior to Admission medications   Medication Sig Start Date End Date Taking? Authorizing Provider  amLODipine (NORVASC) 10 MG tablet Take 1 tablet (10 mg total) by mouth daily. 11/19/14   Doris Cheadle, MD  aspirin 81 MG tablet Take 81 mg  by mouth daily.    Historical Provider, MD  Dexlansoprazole (DEXILANT) 30 MG capsule Take 1 capsule (30 mg total) by mouth daily. 12/30/14   Josalyn Funches, MD  glipiZIDE (GLUCOTROL) 5 MG tablet Take 1 tablet (5 mg total) by mouth daily before breakfast. 11/19/14   Doris Cheadle, MD  hydrALAZINE (APRESOLINE) 50 MG tablet Take 1 tablet (50 mg total) by mouth 3 (three) times daily. Patient taking differently: Take 50 mg by mouth 2 (two) times daily.  11/19/14   Doris Cheadle, MD  HYDROcodone-acetaminophen (NORCO/VICODIN) 5-325 MG per tablet Take 1 tablet by mouth every 4 (four) hours as needed. 12/19/14   Jon Gills, MD  lisinopril (PRINIVIL,ZESTRIL) 40 MG tablet Take 1 tablet (40 mg total) by mouth daily. 11/19/14   Doris Cheadle, MD  sucralfate (CARAFATE) 1 GM/10ML suspension Take 10 mLs (1 g total) by mouth 4 (four) times daily -  with meals and at bedtime. 12/19/14   Jon Gills, MD  triamterene-hydrochlorothiazide (DYAZIDE) 37.5-25 MG per capsule Take 1 each (1 capsule total) by mouth daily. 11/19/14   Doris Cheadle, MD   BP 155/106 mmHg  Pulse 182  Temp(Src) 98.4 F (36.9 C) (Oral)  Resp 21  SpO2 96% Physical Exam  Constitutional: He is oriented to person, place, and time. He appears well-developed and well-nourished.  HENT:  Head: Normocephalic and atraumatic.  Cardiovascular:  No murmur heard. Tachycardic and irregular  Pulmonary/Chest: Effort normal and breath sounds normal. No respiratory distress.  Abdominal: Soft. There is no tenderness. There is no rebound and no guarding.  Musculoskeletal: He exhibits no edema or tenderness.  Neurological: He is alert and oriented to person, place, and time.  Skin: Skin is warm and dry.  Psychiatric: He has a normal mood and affect. His behavior is normal.  Nursing note and vitals reviewed.   ED Course  Procedures (including critical care time) CRITICAL CARE Performed by: Tilden Fossa   Total critical care time: 30 minutes  Critical care  time was exclusive of separately billable procedures and treating other patients.  Critical care was necessary to treat or prevent imminent or life-threatening deterioration.  Critical care was time spent personally by me on the following activities: development of treatment plan with patient and/or surrogate as well as nursing, discussions with consultants, evaluation of patient's response to treatment, examination of patient, obtaining history from patient or surrogate, ordering and performing treatments and interventions, ordering and review of laboratory studies, ordering and review of radiographic studies, pulse oximetry and re-evaluation of patient's condition.  Labs Review Labs Reviewed  I-STAT CHEM 8, ED - Abnormal; Notable for the following:    Glucose, Bld 322 (*)    All other components within normal limits  CBC WITH DIFFERENTIAL/PLATELET  BASIC METABOLIC PANEL  MAGNESIUM  TROPONIN I  TROPONIN I  TROPONIN I  POTASSIUM  I-STAT TROPOININ, ED    Imaging Review Dg Chest Port 1 View  02/20/2015   CLINICAL DATA:  Syncope.  EXAM: PORTABLE CHEST - 1 VIEW  COMPARISON:  None.  FINDINGS: The heart size and mediastinal contours are within normal limits. Both lungs are clear. No pneumothorax or pleural effusion is noted. The visualized skeletal structures are unremarkable.  IMPRESSION: No acute cardiopulmonary abnormality seen.   Electronically Signed   By: Lupita Raider, M.D.   On: 02/20/2015 15:38     EKG Interpretation   Date/Time:  Thursday February 20 2015 15:23:02 EDT Ventricular Rate:  90 PR Interval:  139 QRS Duration: 77 QT Interval:  365 QTC Calculation: 447 R Axis:   41 Text Interpretation:  Sinus rhythm Atrial premature complex LAE, consider  biatrial enlargement q waves in V1, V2 Baseline wander in lead(s) V1  Confirmed by Lincoln Brigham [phone]) on 02/20/2015 3:42:38 PM      MDM   Final diagnoses:  Atrial fibrillation and flutter   patient here for evaluation of  syncopal events. Patient is noted to be an tachycardic rhythm, most likely atrial flutter, question A. Fib. Attempted Identicard 1 in the ED without any change in his rhythm. Patient did receive a done a card twice prior to ED arrival. Patient given a second diltiazem bolus of 20 mg in the ED and started on a drip as well. Awaiting lab results patient did convert to a sinus rhythm while he was on the diltiazem drip. Patient has not had any chest pain or shortness of breath in the ED. He did have an i-STAT chem 8 with a potassium of greater than 8.5, question hyperkalemia but patient does not have a reason to be hyperkalemic. Patient was given some temporizing measures for hyperkalemia. Later is found that the lab was hemolyzed. Repeat BMP is pending. Cardiology was consulted  prior to conversion to sinus rhythm.      Tilden Fossa, MD 02/20/15 [phone]

## 2015-02-20 NOTE — ED Notes (Signed)
MD at bedside.cardiology  

## 2015-02-20 NOTE — H&P (Addendum)
Patient ID: Sean Hinton MRN: [id number], DOB/AGE: 52-May-1962   Admit date: 02/20/2015   Primary Physician: Sean Cheadle, MD Primary Cardiologist: Sean Hinton  Pt. Profile:  54 y/o with h/o DM and HTN but no prior cardiac history presenting to the ED for syncope in the setting of atrial flutter/fibrillation with RVR.   Problem List  Past Medical History  Diagnosis Date  . Diabetes mellitus without complication   . Hypertension     Past Surgical History  Procedure Laterality Date  . Right thumb surgery        Allergies  No Known Allergies  HPI  The patient is a 54 y/o AAM with PMH significant for HTN, 30+ year history of tobacco use and poorly controlled DM (last Hgb A1c ~8.0), but no prior cardiac history presenting to the [facility] ER with syncope in the setting of atrial flutter/fibrillation w/ RVR.  He denies history of alcohol or drug use. His mother is a patient of Dr. Erin Hinton. He notes a significant family h/o of cardiac disease. His mother has coronary disease. He had a brother that died of SCD at the age of 54 while refereeing a college basketball game at [employer] in 2011.   He was in his usual state of health until earlier today around 12 noon. He had just finished lunch at work (ate 3 slices of pizza and a Sprite). He was standing at his work station typing on the computer when he suddenly felt lightheaded, hot and diaphoretic. He suddenly passed out and collapsed to the floor. The event was witnessed by co-workers. He had loss consciousness for roughly 15-30 seconds. When he awoke, he felt fine. He denied chest pain, palpitations, and dyspnea. His lightheadedness resolved. EMS was called. On arrival, EKG showed a rapid HR in the 200s. He received adenosine 6mg  followed by 12mg  with no change in his rhythm. He then received cardizem 20 mg and his rate improved. He was transported to [facility] ED for further evaluation.  EKG strips reveal atrial flutter/ fibrillation w/ a RVR  in the 170s. IV Cardizem was continued in the ED and he converted to NSR. He remains asymptomatic. CXR is unremarkable. K is normal at 3.5. Mg pending. POC troponin is negative.    Home Medications  Prior to Admission medications   Medication Sig Start Date End Date Taking? Authorizing Provider  acetaminophen (TYLENOL) 325 MG tablet Take 650 mg by mouth every 6 (six) hours as needed for mild pain.   Yes Historical Provider, MD  amLODipine (NORVASC) 10 MG tablet Take 1 tablet (10 mg total) by mouth daily. 11/19/14  Yes Sean [facility], MD  aspirin 81 MG tablet Take 81 mg by mouth daily.   Yes Historical Provider, MD  Dexlansoprazole (DEXILANT) 30 MG capsule Take 1 capsule (30 mg total) by mouth daily. 12/30/14  Yes Sean Funches, MD  glipiZIDE (GLUCOTROL) 5 MG tablet Take 1 tablet (5 mg total) by mouth daily before breakfast. 11/19/14  Yes Sean Advani, MD  hydrALAZINE (APRESOLINE) 50 MG tablet Take 1 tablet (50 mg total) by mouth 3 (three) times daily. Patient taking differently: Take 50 mg by mouth 2 (two) times daily.  11/19/14  Yes 11/21/14, MD  lisinopril (PRINIVIL,ZESTRIL) 40 MG tablet Take 1 tablet (40 mg total) by mouth daily. 11/19/14  Yes Sean Cheadle, MD  triamterene-hydrochlorothiazide (DYAZIDE) 37.5-25 MG per capsule Take 1 each (1 capsule total) by mouth daily. 11/19/14  Yes 07-31-1992, MD  HYDROcodone-acetaminophen (NORCO/VICODIN) 5-325 MG per tablet  Take 1 tablet by mouth every 4 (four) hours as needed. Patient not taking: Reported on 02/20/2015 12/19/14   Sean Gills, MD  sucralfate (CARAFATE) 1 GM/10ML suspension Take 10 mLs (1 g total) by mouth 4 (four) times daily -  with meals and at bedtime. Patient not taking: Reported on 02/20/2015 12/19/14   Sean Gills, MD    Family History  Family History  Problem Relation Age of Onset  . Diabetes Mother   . Hypertension Mother   . Heart disease Mother   . Cancer Mother     stomach cancer   . Diabetes Father   . Hypertension  Father   . Heart disease Father   . Diabetes Sister   . Hypertension Sister   . Cancer Sister     breast cancer   . Diabetes Brother   . Hypertension Brother   . Heart disease Brother   . Stroke Brother     Social History  History   Social History  . Marital Status: Single    Spouse Name: N/A  . Number of Children: N/A  . Years of Education: N/A   Occupational History  . Not on file.   Social History Main Topics  . Smoking status: Current Every Day Smoker -- 0.50 packs/day for 30 years  . Smokeless tobacco: Not on file  . Alcohol Use: No  . Drug Use: No     Comment: last use 1991  . Sexual Activity: Not on file   Other Topics Concern  . Not on file   Social History Narrative     Review of Systems General:  No chills, fever, night sweats or weight changes.  Cardiovascular:  No chest pain, dyspnea on exertion, edema, orthopnea, palpitations, paroxysmal nocturnal dyspnea. Dermatological: No rash, lesions/masses Respiratory: No cough, dyspnea Urologic: No hematuria, dysuria Abdominal:   No nausea, vomiting, diarrhea, bright red blood per rectum, melena, or hematemesis Neurologic:  No visual changes, wkns, changes in mental status. All other systems reviewed and are otherwise negative except as noted above.  Physical Exam  Blood pressure 134/83, pulse 109, temperature 98.4 F (36.9 C), temperature source Oral, resp. rate 19, height 5\' 10"  (1.778 m), weight 190 lb (86.183 kg), SpO2 100 %.  General: Pleasant, NAD Psych: Normal affect. Neuro: Alert and oriented X 3. Moves all extremities spontaneously. HEENT: Normal  Neck: Supple without bruits or JVD. Lungs:  Resp regular and unlabored, CTA. Heart: RRR no s3, s4, or murmurs. Abdomen: Soft, non-tender, non-distended, BS + x 4.  Extremities: No clubbing, cyanosis or edema. DP/PT/Radials 2+ and equal bilaterally.  Labs  Troponin [facility] Test)  Recent Labs  02/20/15 1424  TROPIPOC 0.04   No results  for input(s): CKTOTAL, CKMB, TROPONINI in the last 72 hours. Lab Results  Component Value Date   WBC 8.6 02/20/2015   HGB 16.3 02/20/2015   HCT 48.0 02/20/2015   MCV 83.1 02/20/2015   PLT 333 02/20/2015    Recent Labs Lab 02/20/15 1519  NA 143  K 3.5  CL 105  BUN 18  CREATININE 1.00  GLUCOSE 322*   No results found for: CHOL, HDL, LDLCALC, TRIG No results found for: DDIMER   Radiology/Studies  No results found.  ECG  Initial EKGs demonstrated atrial flutter/fibrillation w/ RVR in the 170s. Now in NSR on telemetry.    ASSESSMENT AND PLAN  1. Syncope and Collapse: secondary to rapid atrial flutter/fibrillation. See plan below.   2. Atrial Flutter/Fib w/ RVR: EKG  strips reviewed by Dr. Anne Fu and Dr. Johney Frame. Patient appeared to be in 2:1 atrial flutter. He has converted to NSR on IV Dilt. HR stable. Will convert to PO. 60 mg Q6H. Will obtain a 2D echo and plan for NST to assess for ischemia as outpatient.  CHA2DS2 VASc score is at least 2 with h/o DM and HTN. Continue on telemetry.      Signed, Robbie Lis, PA-C 02/20/2015, 3:35 PM  Personally seen and examined. Agree with above. Discussed with Dr. Johney Frame, showed strips.  Rapid AFIB (up to 240 bpm)  Does not believe accessory pathway is present. Normal exam. Brother with sudden cardiac death in 2010/02/26 while reffing a basketball game. I want to ensure that he does not have any evidence of HOCM on ECHO. QT is normal.  For PAF  - TSH  - ECHO  - Diltiazem PO (changing from IV)  - Xarelto  - NUC stress as outpatient would be reasonable (no chest pain)  - would expect Troponin to be mildly positive with RVR, AFIB   - Close follow up in AFIB clinic.   TOBACCO CESSATION  DM - close management  Donato Schultz, MD

## 2015-02-20 NOTE — ED Notes (Signed)
Family at bedside. 

## 2015-02-20 NOTE — ED Notes (Signed)
Lab results reported to Dr. Rees 

## 2015-02-20 NOTE — ED Notes (Addendum)
Lab results of I- stat 8 reported to Dr.Rees, informed her we have to redraw blood,Purpose of redraw [K]  was greater then 8.5.

## 2015-02-20 NOTE — Progress Notes (Signed)
ANTICOAGULATION CONSULT NOTE - Initial Consult  Pharmacy Consult for Xarelto Indication: atrial fibrillation  No Known Allergies  Patient Measurements: Height: 5\' 10"  (177.8 cm) Weight: 190 lb (86.183 kg) IBW/kg (Calculated) : 73   Vital Signs: Temp: 98.4 F (36.9 C) (04/28 1405) Temp Source: Oral (04/28 1405) BP: 140/101 mmHg (04/28 1700) Pulse Rate: 87 (04/28 1700)  Labs:  Recent Labs  02/20/15 1408 02/20/15 1519 02/20/15 1600  HGB 15.9 16.3  --   HCT 45.1 48.0  --   PLT 333  --   --   CREATININE  --  1.00 1.20  TROPONINI  --   --  0.06*    Estimated Creatinine Clearance: 75.2 mL/min (by C-G formula based on Cr of 1.2).   Medical History: Past Medical History  Diagnosis Date  . Diabetes mellitus without complication   . Hypertension     Medications:   (Not in a hospital admission)  Assessment: 66 yoM presents with a syncopal episode in the setting of new onset A flutter/fib with RVR. CHA2DS2VAsc = 3. Pharmacy consulted to dose Xarelto. SCr 1.20 (CrCl ~75 ml/min), CBC wnl.  Goal of Therapy:  Monitor platelets by anticoagulation protocol: Yes   Plan:  Xarelto 20 mg po daily Monitor for signs of bleeding Follow up Xarelto education  46, PharmD Candidate  02/20/2015,5:29 PM

## 2015-02-20 NOTE — ED Notes (Signed)
Pt had syncope episode at work. Pt denies any pain or soB. PT had HR in 200s on ems arrival. 18mg  of adenosine given and 20 of Cardizem with no change. Pt alert and ox3. HR 200 on arrival to eD.

## 2015-02-21 ENCOUNTER — Telehealth: Payer: Self-pay

## 2015-02-21 DIAGNOSIS — I519 Heart disease, unspecified: Secondary | ICD-10-CM

## 2015-02-21 LAB — TROPONIN I: TROPONIN I: 0.05 ng/mL — AB (ref <0.031)

## 2015-02-21 LAB — BASIC METABOLIC PANEL
ANION GAP: 9 (ref 5–15)
BUN: 12 mg/dL (ref 6–23)
CALCIUM: 8.9 mg/dL (ref 8.4–10.5)
CHLORIDE: 107 mmol/L (ref 96–112)
CO2: 24 mmol/L (ref 19–32)
Creatinine, Ser: 1.16 mg/dL (ref 0.50–1.35)
GFR calc non Af Amer: 71 mL/min — ABNORMAL LOW (ref >90)
GFR, EST AFRICAN AMERICAN: 83 mL/min — AB (ref >90)
Glucose, Bld: 192 mg/dL — ABNORMAL HIGH (ref 70–99)
Potassium: 3.7 mmol/L (ref 3.5–5.1)
Sodium: 140 mmol/L (ref 135–145)

## 2015-02-21 LAB — GLUCOSE, CAPILLARY
GLUCOSE-CAPILLARY: 247 mg/dL — AB (ref 70–99)
Glucose-Capillary: 205 mg/dL — ABNORMAL HIGH (ref 70–99)

## 2015-02-21 LAB — T4, FREE: Free T4: 1.13 ng/dL (ref 0.80–1.80)

## 2015-02-21 LAB — TSH: TSH: 1.538 u[IU]/mL (ref 0.350–4.500)

## 2015-02-21 MED ORDER — ENOXAPARIN SODIUM 100 MG/ML ~~LOC~~ SOLN
1.0000 mg/kg | Freq: Two times a day (BID) | SUBCUTANEOUS | Status: DC
  Administered 2015-02-21 – 2015-02-23 (×5): 85 mg via SUBCUTANEOUS
  Filled 2015-02-21 (×5): qty 1

## 2015-02-21 MED ORDER — LIVING WELL WITH DIABETES BOOK
Freq: Once | Status: DC
  Filled 2015-02-21: qty 1

## 2015-02-21 MED ORDER — PERFLUTREN LIPID MICROSPHERE
1.0000 mL | INTRAVENOUS | Status: AC | PRN
  Filled 2015-02-21: qty 10

## 2015-02-21 MED ORDER — METOPROLOL TARTRATE 50 MG PO TABS
50.0000 mg | ORAL_TABLET | Freq: Two times a day (BID) | ORAL | Status: DC
  Administered 2015-02-21 (×2): 50 mg via ORAL
  Filled 2015-02-21 (×2): qty 1

## 2015-02-21 NOTE — Telephone Encounter (Signed)
Patient's girlfriend called office to notify us that mr Sean Hinton was admitted the hospital for  Syncope episode and rapid heart beat Patient is currently on 3west room 17

## 2015-02-21 NOTE — Progress Notes (Signed)
Inpatient Diabetes Program Recommendations  AACE/ADA: New Consensus Statement on Inpatient Glycemic Control (2013)  Target Ranges:  Prepandial:   less than 140 mg/dL      Peak postprandial:   less than 180 mg/dL (1-2 hours)      Critically ill patients:  140 - 180 mg/dL   Results for TANDRE, CONLY (MRN [id number]) as of 02/21/2015 13:22  Ref. Range 02/20/2015 18:43 02/21/2015 00:31 02/21/2015 08:25  Glucose-Capillary Latest Ref Range: 70-99 mg/dL 488 (H) 891 (H) 694 (H)   Reason for Visit: Syncope, A. Fib RVR  Diabetes history: DM 2 Outpatient Diabetes medications: Glipizide 5 mg Daily Current orders for Inpatient glycemic control: Glipizide 5 mg Daily  Inpatient Diabetes Program Recommendations Correction (SSI): While inpatient, please check CBGs and place patient on insulin correction, Novolog 0-9 units TID and Novolog 0-5 units QHS.   Consult Note:   Spoke with patient about diabetes and home regimen for diabetes control. Patient reports that he is followed by the [facility] for diabetes management and currently he takes Glimepiride 5 mg Daily.  Inquired about knowledge about A1C and patient reports that he does know what an A1C is. Discussed A1C results (8% Jan/2016) and basic home care, importance of checking CBGs and maintaining good CBG control to prevent long-term and short-term complications. Patient discusses how he checks his glucose once-twice a day. First thing in the morning or before bed. He also discusses how he has had lows with the 5 mg and had started to take it at night so he wouldn't "fall out". He also said he started taking half a tablet to see if he didn't drop as much.  I discussed with him that he may be having hyperglycemia during the day or at night and not know it. I also told him that these periods of time when he is high will cancels out when he has his lows and that his A1c would not necessarily show that. He explained that since he was diagnosed 1.5 years ago that he  has lost 50 lbs, which may have led to his hypoglycemia in addition to his diet habits. He says he does not eat many carbs at home. He eats brown rice at home broccoli at times but mostly only proteins. I discussed the importance of a balanced diet and that his body needs some carbs to function normally. Discussed impact of nutrition, exercise, stress, sickness, and medications on diabetes control. Discussed carbohydrates, carbohydrate goals per day and meal, along with portion sizes. Patient verbalized understanding of information discussed and she states that she has no further questions at this time related to diabetes.   Thanks,  Christena Deem RN, MSN, [facility] Inpatient Diabetes Coordinator Team Pager [phone]

## 2015-02-21 NOTE — Progress Notes (Signed)
UR completed 

## 2015-02-21 NOTE — Progress Notes (Signed)
Pt was asking nurse tech for a wheel chair to go see his mother on the 6th floor. No order in the chart. Pt stated Dr. Leron Croak [facility] PA) told him he could. I called Wilburt Finlay PA to verify. He stated he did not tell the pt this. I called Dr. Anne Fu to see if maybe he had told the pt this. He stated no he did not and he did not think the pt should go due to his HR being in the 200's on arrival. I notified the pt and his significant other of Dr. Judd Gaudier response. The pt and family were not happy with the decisions. I explained the rationale as to why the doctor did not want him to leave the floor. The male in the room stated it was his right. I stated I am not holding him here, but I could not give him a wheel chair when the physician has said he will not give the order for the pt to leave the floor. The male stated she was going to call the [facility] and the patient advocate. I spoke with Tana Conch [facility] and he is going to speak with the pt. Emelda Brothers RN, Press photographer

## 2015-02-21 NOTE — Progress Notes (Signed)
ANTICOAGULATION CONSULT NOTE Pharmacy Consult for change xarelto to LMWH Indication: atrial fibrillation  No Known Allergies  Patient Measurements: Height: 5\' 10"  (177.8 cm) Weight: 187 lb (84.823 kg) IBW/kg (Calculated) : 73   Vital Signs: Temp: 98.8 F (37.1 C) (04/29 0443) Temp Source: Oral (04/29 0443) BP: 149/84 mmHg (04/29 0443) Pulse Rate: 90 (04/29 0443)  Labs:  Recent Labs  02/20/15 1408 02/20/15 1519 02/20/15 1600 02/20/15 2245 02/21/15 0558  HGB 15.9 16.3  --   --   --   HCT 45.1 48.0  --   --   --   PLT 333  --   --   --   --   CREATININE  --  1.00 1.20  --  1.16  TROPONINI  --   --  0.06* 0.06* 0.05*    Estimated Creatinine Clearance: 77.8 mL/min (by C-G formula based on Cr of 1.16).   Medical History: Past Medical History  Diagnosis Date  . Diabetes mellitus without complication   . Hypertension      Assessment: 43 yoM presented with a syncopal episode in the setting of new onset A flutter/fib with RVR. CHA2DS2VAsc = 3. Pharmacy consulted to dose Xarelto on 4/28.  Now pharmacy consulted to dose LMWH for afib in anticipation of cath on Monday. Xarelto 20 mg x 1 dose given 4/28 at 1900.  Creat 1.16, wt 85 kg. CBC WNL.   Goal of Therapy:  Monitor platelets by anticoagulation protocol: Yes   Plan:  Start LMWH 1 mg/kg tonight at 1800 For cath Monday Anticipate transition back to xarelto s/p cath  Sunday, Pharm.D. Herby Abraham 02/21/2015 1:15 PM

## 2015-02-21 NOTE — Progress Notes (Addendum)
    Subjective: No dizziness. CP.   Objective: Vital signs in last 24 hours: Temp:  [98.4 F (36.9 C)-98.9 F (37.2 C)] 98.8 F (37.1 C) (04/29 0443) Pulse Rate:  [41-182] 90 (04/29 0443) Resp:  [13-32] 18 (04/29 0443) BP: (127-166)/(65-130) 149/84 mmHg (04/29 0443) SpO2:  [95 %-100 %] 95 % (04/29 0443) Weight:  [187 lb (84.823 kg)-190 lb (86.183 kg)] 187 lb (84.823 kg) (04/29 0443) Last BM Date: 02/20/15  Intake/Output from previous day: 04/28 0701 - 04/29 0700 In: 490 [P.O.:480; I.V.:10] Out: 950 [Urine:950] Intake/Output this shift:    Medications Scheduled Meds: . diltiazem  60 mg Oral 4 times per day  . glipiZIDE  5 mg Oral QAC breakfast  . hydrALAZINE  50 mg Oral TID  . lisinopril  40 mg Oral Daily  . rivaroxaban  20 mg Oral Q supper  . triamterene-hydrochlorothiazide  1 capsule Oral Daily   Continuous Infusions:  PRN Meds:.acetaminophen, ondansetron (ZOFRAN) IV  PE: General appearance: alert, cooperative and no distress Lungs: clear to auscultation bilaterally Heart: regular rate and rhythm, S1, S2 normal, no murmur, click, rub or gallop Extremities: No LEE Pulses: 2+ and symmetric Skin: Warm and dry Neurologic: Grossly normal  Lab Results:   Recent Labs  02/20/15 1408 02/20/15 1519  WBC 8.6  --   HGB 15.9 16.3  HCT 45.1 48.0  PLT 333  --    BMET  Recent Labs  02/20/15 1519 02/20/15 1600 02/21/15 0558  NA 143 141 140  K 3.5 3.6  3.6 3.7  CL 105 110 107  CO2  --  23 24  GLUCOSE 322* 167* 192*  BUN 18 14 12   CREATININE 1.00 1.20 1.16  CALCIUM  --  8.7 8.9    Assessment/Plan  54 y/o with h/o DM and HTN but no prior cardiac history presenting to the ED for syncope in the setting of atrial flutter/fibrillation with RVR  Active Problems:   Atrial flutter with rapid ventricular response   Tobacco abuse   DM-uncontrolled    Converted to NSR.  On Xarelto, diltiazem 60 Q6, hydralazine 50 TID, lisinopril 40, Dyazide.  Echo pending.   Will change to 24 hr cardizem 240.  TSH WNL.  Hopefully DC home later today.  A1C 8.0 in Jan.  Discussed low carb diet.  On glipizide.   Feb PA-C 02/21/2015 7:44 AM  Personally seen and examined. Agree with above. RRR See consult note OK with DC today after ECHO - ? HOCM (Brother SCD) DIlt CD NUC outpatient  TOB cessation  Emeric Novinger, MD  Addendum: Echocardiogram preliminarily shows an anterior septal wall/apical wall defect may correlate with coronary artery disease. This could also be in the differential of stress-induced cardiomyopathy. With his recent syncopal incident, brother with sudden cardiac death, described wall motion abnormality, we will stop his anticoagulation, received 1 dose of Xarelto. We will place him on Lovenox and plan on cardiac catheterization on Monday to exclude coronary artery disease. He understands. We will also change him from diltiazem over to metoprolol. Add ACE inhibitor if blood pressure allows.  Friday, MD

## 2015-02-21 NOTE — Care Management Note (Signed)
    Page 1 of 1   02/25/2015     4:15:50 PM CARE MANAGEMENT NOTE 02/25/2015  Patient:  Sean Hinton, Sean Hinton   Account Number:  [id number]  Date Initiated:  02/21/2015  Documentation initiated by:  GRAVES-BIGELOW,Landyn Lorincz  Subjective/Objective Assessment:   Pt admitted for syncope and  A Fib RVR. Pt is without insurance. He is established @ the [facility].     Action/Plan:   CM did provide pt with Xarelto card. Patient Assistance Form placed on chart-for MD to fill out. CM will make hospital  f/u appointment with clinic as it gets closer to d/c.   Anticipated DC Date:     Anticipated DC Plan:        DC Planning Services  CM consult  Follow-up appt scheduled  [facility]      Choice offered to / List presented to:             Status of service:  Completed, signed off Medicare Important Message given?  NO (If response is "NO", the following Medicare IM given date fields will be blank) Date Medicare IM given:   Medicare IM given by:   Date Additional Medicare IM given:   Additional Medicare IM given by:    Discharge Disposition:  HOME/SELF CARE  Per UR Regulation:  Reviewed for med. necessity/level of care/duration of stay  If discussed at Long Length of Stay Meetings, dates discussed:    Comments:

## 2015-02-21 NOTE — Progress Notes (Signed)
Echocardiogram 2D Echocardiogram with Definity has been performed.  Sean Hinton 02/21/2015, 12:20 PM

## 2015-02-22 DIAGNOSIS — R55 Syncope and collapse: Secondary | ICD-10-CM

## 2015-02-22 DIAGNOSIS — E119 Type 2 diabetes mellitus without complications: Secondary | ICD-10-CM

## 2015-02-22 DIAGNOSIS — E785 Hyperlipidemia, unspecified: Secondary | ICD-10-CM

## 2015-02-22 DIAGNOSIS — I4892 Unspecified atrial flutter: Secondary | ICD-10-CM

## 2015-02-22 DIAGNOSIS — I10 Essential (primary) hypertension: Secondary | ICD-10-CM

## 2015-02-22 DIAGNOSIS — I4891 Unspecified atrial fibrillation: Secondary | ICD-10-CM

## 2015-02-22 LAB — GLUCOSE, CAPILLARY
GLUCOSE-CAPILLARY: 155 mg/dL — AB (ref 70–99)
GLUCOSE-CAPILLARY: 118 mg/dL — AB (ref 70–99)
Glucose-Capillary: 309 mg/dL — ABNORMAL HIGH (ref 70–99)
Glucose-Capillary: 99 mg/dL (ref 70–99)

## 2015-02-22 MED ORDER — HYDRALAZINE HCL 20 MG/ML IJ SOLN
5.0000 mg | Freq: Once | INTRAMUSCULAR | Status: AC
  Administered 2015-02-22: 5 mg via INTRAVENOUS
  Filled 2015-02-22: qty 1

## 2015-02-22 MED ORDER — INSULIN ASPART 100 UNIT/ML ~~LOC~~ SOLN
0.0000 [IU] | Freq: Three times a day (TID) | SUBCUTANEOUS | Status: DC
  Administered 2015-02-23: 3 [IU] via SUBCUTANEOUS

## 2015-02-22 MED ORDER — HYDRALAZINE HCL 50 MG PO TABS
75.0000 mg | ORAL_TABLET | Freq: Three times a day (TID) | ORAL | Status: DC
  Administered 2015-02-22 – 2015-02-25 (×10): 75 mg via ORAL
  Filled 2015-02-22 (×24): qty 1

## 2015-02-22 MED ORDER — ATORVASTATIN CALCIUM 40 MG PO TABS
40.0000 mg | ORAL_TABLET | Freq: Every day | ORAL | Status: DC
  Administered 2015-02-22 – 2015-02-24 (×3): 40 mg via ORAL
  Filled 2015-02-22 (×3): qty 1

## 2015-02-22 MED ORDER — GLIPIZIDE 5 MG PO TABS
5.0000 mg | ORAL_TABLET | Freq: Two times a day (BID) | ORAL | Status: DC
  Administered 2015-02-22 – 2015-02-25 (×6): 5 mg via ORAL
  Filled 2015-02-22 (×7): qty 1

## 2015-02-22 MED ORDER — METOPROLOL TARTRATE 50 MG PO TABS
75.0000 mg | ORAL_TABLET | Freq: Two times a day (BID) | ORAL | Status: DC
  Administered 2015-02-22 – 2015-02-24 (×5): 75 mg via ORAL
  Filled 2015-02-22 (×10): qty 1

## 2015-02-22 NOTE — Progress Notes (Signed)
UR completed 

## 2015-02-22 NOTE — Progress Notes (Signed)
Patient called out to desk and reported he felt short of breath, this nurse was in another patients room and unable to check on him. Another nurse applied 02 at 2l per N/C, although patients oxygen level was high 90's. Patient was assessed at midnight to be sleeping comfortable with no further c/o's of SHOB. Will continue to monitor . Peri Jefferson

## 2015-02-22 NOTE — Progress Notes (Signed)
Patient Name: Sean Hinton Date of Encounter: 02/22/2015   Principal Problem:   Syncope Active Problems:   Atrial flutter with rapid ventricular response   Essential hypertension   Left ventricular systolic dysfunction   Tobacco use   Diabetes mellitus without complication   Hyperlipidemia    SUBJECTIVE  Hypertensive with periodic dyspnea overnight.  Occasionally feels like he can't get a good, satisfying breath.  No recurrent arrhythmias.  CURRENT MEDS . enoxaparin (LOVENOX) injection  1 mg/kg Subcutaneous Q12H  . glipiZIDE  5 mg Oral QAC breakfast  . hydrALAZINE  50 mg Oral TID  . lisinopril  40 mg Oral Daily  . living well with diabetes book   Does not apply Once  . metoprolol tartrate  75 mg Oral BID  . triamterene-hydrochlorothiazide  1 capsule Oral Daily    OBJECTIVE  Filed Vitals:   02/22/15 0000 02/22/15 0249 02/22/15 0351 02/22/15 0430  BP:  171/106 147/103 145/89  Pulse:    86  Temp:    99 F (37.2 C)  TempSrc:    Oral  Resp:      Height:      Weight:      SpO2: 99%  95% 96%    Intake/Output Summary (Last 24 hours) at 02/22/15 0755 Last data filed at 02/21/15 2200  Gross per 24 hour  Intake    600 ml  Output      0 ml  Net    600 ml   Filed Weights   02/20/15 1425 02/21/15 0443  Weight: 190 lb (86.183 kg) 187 lb (84.823 kg)    PHYSICAL EXAM  General: Pleasant, NAD. Neuro: Alert and oriented X 3. Moves all extremities spontaneously. Psych: Normal affect. HEENT:  Normal  Neck: Supple without bruits or JVD. Lungs:  Resp regular and unlabored, CTA. Heart: RRR no s3, s4, or murmurs. Abdomen: Soft, non-tender, non-distended, BS + x 4.  Extremities: No clubbing, cyanosis or edema. DP/PT/Radials 2+ and equal bilaterally.  Accessory Clinical Findings  CBC  Recent Labs  02/20/15 1408 02/20/15 1519  WBC 8.6  --   NEUTROABS 5.0  --   HGB 15.9 16.3  HCT 45.1 48.0  MCV 83.1  --   PLT 333  --    Basic Metabolic Panel  Recent Labs  02/20/15 1600 02/21/15 0558  NA 141 140  K 3.6  3.6 3.7  CL 110 107  CO2 23 24  GLUCOSE 167* 192*  BUN 14 12  CREATININE 1.20 1.16  CALCIUM 8.7 8.9  MG 2.0  --    Cardiac Enzymes  Recent Labs  02/20/15 1600 02/20/15 2245 02/21/15 0558  TROPONINI 0.06* 0.06* 0.05*   Thyroid Function Tests  Recent Labs  02/20/15 2245  TSH 1.538    TELE  rsr  Radiology/Studies  Dg Chest Port 1 View  02/20/2015   CLINICAL DATA:  Syncope.  EXAM: PORTABLE CHEST - 1 VIEW  COMPARISON:  None.  FINDINGS: The heart size and mediastinal contours are within normal limits. Both lungs are clear. No pneumothorax or pleural effusion is noted. The visualized skeletal structures are unremarkable.  IMPRESSION: No acute cardiopulmonary abnormality seen.   Electronically Signed   By: Lupita Raider, M.D.   On: 02/20/2015 15:38    ASSESSMENT AND PLAN  1.  Syncope:  In setting of Aflutter RVR.  Echo w/ WMA.  Pending cath Monday.  2.  Aflutter w/ RVR:  Maintaining sinus.  CHA2DS2VASc = 2.  Xarelto currently on hold  in preparation for cath on Monday.  Cont bb.  3.  Essential HTN:  BP trending high and req prn IV hydralazine.  Will titrate PO hydralazine.  Cont lisinopril/bb.  4.  HL:  Check lipids.  Nl lft's in Feb.  In setting of DM, add statin.  Was previously on simva but when he moved here it wasn't continued by current PCP.  5. Tob Abuse:  Cessation advised.  6.  DM II:  Change glipizide to bid as sugars are trending in 200's.  Add SSI.  A1c 8.0 in January 2016.  7.  Other:  Pt would like to visit his mother who is a pt on 6E.  Pt has stated that he plans to walk over there with his wife today.  Given admission with syncope and rapid aflutter, this has been advised against.  Pt verbalizes understanding of our recommendation.  Signed, Nicolasa Ducking NP  I have seen and examined the patient along with Nicolasa Ducking NP.  I have reviewed the chart, notes and new data.  I agree with NP's  note.  Key new complaints: feels well, back to normal Key examination changes: normal cardiac exam Key new findings / data: echo to be reviewed  PLAN: Cardiac cath Monday.  [name], MD, [facility] [phone] 02/22/2015, 11:52 AM

## 2015-02-22 NOTE — Progress Notes (Signed)
Patient's b/p elevated 171/106,despite hydralazine and lopressor given, MD Ihor Gully was notified and new order for IV hydralazine will be ordered. Patient is sleeping with no c/o's voiced.  [facility] [employer]

## 2015-02-22 NOTE — Progress Notes (Signed)
Pt traveled off the unit with significant other to visit his mother, who is hospitalized on another unit. Pt was medically advised against doing so, but elected to anyway. Pt stated he would be off the unit for about an hour. I offered to take pt over to his mother for 10-15 min, but he declined as he wanted to stay longer. Geronimo Boot, RN

## 2015-02-23 DIAGNOSIS — I255 Ischemic cardiomyopathy: Secondary | ICD-10-CM

## 2015-02-23 LAB — BASIC METABOLIC PANEL
Anion gap: 10 (ref 5–15)
BUN: 16 mg/dL (ref 6–20)
CALCIUM: 9.1 mg/dL (ref 8.9–10.3)
CHLORIDE: 106 mmol/L (ref 101–111)
CO2: 22 mmol/L (ref 22–32)
CREATININE: 1.24 mg/dL (ref 0.61–1.24)
GFR calc Af Amer: >60 mL/min (ref >60)
GFR calc non Af Amer: >60 mL/min (ref >60)
Glucose, Bld: 156 mg/dL — ABNORMAL HIGH (ref 70–99)
Potassium: 3.7 mmol/L (ref 3.5–5.1)
Sodium: 138 mmol/L (ref 135–145)

## 2015-02-23 LAB — CBC
HCT: 48.9 % (ref 39.0–52.0)
HEMOGLOBIN: 16.5 g/dL (ref 13.0–17.0)
MCH: 28.4 pg (ref 26.0–34.0)
MCHC: 33.7 g/dL (ref 30.0–36.0)
MCV: 84.3 fL (ref 78.0–100.0)
Platelets: 189 10*3/uL (ref 150–400)
RBC: 5.8 MIL/uL (ref 4.22–5.81)
RDW: 14.5 % (ref 11.5–15.5)
WBC: 9.8 10*3/uL (ref 4.0–10.5)

## 2015-02-23 LAB — GLUCOSE, CAPILLARY
GLUCOSE-CAPILLARY: 120 mg/dL — AB (ref 70–99)
Glucose-Capillary: 152 mg/dL — ABNORMAL HIGH (ref 70–99)
Glucose-Capillary: 100 mg/dL — ABNORMAL HIGH (ref 70–99)
Glucose-Capillary: 122 mg/dL — ABNORMAL HIGH (ref 70–99)

## 2015-02-23 LAB — LIPID PANEL
CHOL/HDL RATIO: 5.3 ratio
CHOLESTEROL: 170 mg/dL (ref 0–200)
HDL: 32 mg/dL — ABNORMAL LOW (ref >40)
LDL CALC: 122 mg/dL — AB (ref 0–99)
Triglycerides: 78 mg/dL (ref <150)
VLDL: 16 mg/dL (ref 0–40)

## 2015-02-23 MED ORDER — ASPIRIN 81 MG PO CHEW
81.0000 mg | CHEWABLE_TABLET | ORAL | Status: AC
  Administered 2015-02-24: 81 mg via ORAL
  Filled 2015-02-23: qty 1

## 2015-02-23 MED ORDER — SODIUM CHLORIDE 0.9 % IV SOLN: 250.0000 mL | INTRAVENOUS | Status: DC | PRN

## 2015-02-23 MED ORDER — SODIUM CHLORIDE 0.9 % IJ SOLN: 3.0000 mL | INTRAMUSCULAR | Status: DC | PRN

## 2015-02-23 MED ORDER — SODIUM CHLORIDE 0.9 % IV SOLN
1.0000 mL/kg/h | INTRAVENOUS | Status: DC
  Administered 2015-02-23: 1 mL/kg/h via INTRAVENOUS

## 2015-02-23 MED ORDER — SODIUM CHLORIDE 0.9 % IJ SOLN
3.0000 mL | Freq: Two times a day (BID) | INTRAMUSCULAR | Status: DC
  Administered 2015-02-23 (×2): 3 mL via INTRAVENOUS

## 2015-02-23 NOTE — Progress Notes (Signed)
Patient Name: Sean Hinton Date of Encounter: 02/23/2015  Principal Problem:   Syncope Active Problems:   Essential hypertension   Hyperlipidemia   Atrial flutter with rapid ventricular response   Left ventricular systolic dysfunction   Tobacco use   Diabetes mellitus without complication   Atrial fibrillation and flutter   Length of Stay:   SUBJECTIVE  No angina. No dyspnea at rest. Maintaining NSR. Echo shows extensive area of abnormal wall motion in distal LAD +/- distal RCA territory and reduced LVEF.   CURRENT MEDS . atorvastatin  40 mg Oral q1800  . enoxaparin (LOVENOX) injection  1 mg/kg Subcutaneous Q12H  . glipiZIDE  5 mg Oral BID AC  . hydrALAZINE  75 mg Oral TID  . insulin aspart  0-15 Units Subcutaneous TID WC  . lisinopril  40 mg Oral Daily  . living well with diabetes book   Does not apply Once  . metoprolol tartrate  75 mg Oral BID  . triamterene-hydrochlorothiazide  1 capsule Oral Daily    OBJECTIVE   Intake/Output Summary (Last 24 hours) at 02/23/15 0846 Last data filed at 02/22/15 2200  Gross per 24 hour  Intake    120 ml  Output      0 ml  Net    120 ml   Filed Weights   02/20/15 1425 02/21/15 0443 02/23/15 0530  Weight: 190 lb (86.183 kg) 187 lb (84.823 kg) 183 lb 11.2 oz (83.326 kg)    PHYSICAL EXAM Filed Vitals:   02/22/15 1512 02/22/15 2100 02/23/15 0000 02/23/15 0530  BP: 151/95 164/111 132/99 156/108  Pulse: 82 91  81  Temp: 98.7 F (37.1 C) 99.3 F (37.4 C)  99 F (37.2 C)  TempSrc: Oral Oral  Oral  Resp: 14 18  18   Height:      Weight:    183 lb 11.2 oz (83.326 kg)  SpO2: 100% 98%  100%   General: Alert, oriented x3, no distress Head: no evidence of trauma, PERRL, EOMI, no exophtalmos or lid lag, no myxedema, no xanthelasma; normal ears, nose and oropharynx Neck: normal jugular venous pulsations and no hepatojugular reflux; brisk carotid pulses without delay and no carotid bruits Chest: clear to auscultation, no signs  of consolidation by percussion or palpation, normal fremitus, symmetrical and full respiratory excursions Cardiovascular: normal position and quality of the apical impulse, regular rhythm, normal first and second heart sounds, no rubs or gallops, no murmur Abdomen: no tenderness or distention, no masses by palpation, no abnormal pulsatility or arterial bruits, normal bowel sounds, no hepatosplenomegaly Extremities: no clubbing, cyanosis or edema; 2+ radial, ulnar and brachial pulses bilaterally; 2+ right femoral, posterior tibial and dorsalis pedis pulses; 2+ left femoral, posterior tibial and dorsalis pedis pulses; no subclavian or femoral bruits Neurological: grossly nonfocal  LABS  CBC  Recent Labs  02/20/15 1408 02/20/15 1519 02/23/15 0525  WBC 8.6  --  9.8  NEUTROABS 5.0  --   --   HGB 15.9 16.3 16.5  HCT 45.1 48.0 48.9  MCV 83.1  --  84.3  PLT 333  --  189   Basic Metabolic Panel  Recent Labs  02/20/15 1600 02/21/15 0558 02/23/15 0525  NA 141 140 138  K 3.6  3.6 3.7 3.7  CL 110 107 106  CO2 23 24 22   GLUCOSE 167* 192* 156*  BUN 14 12 16   CREATININE 1.20 1.16 1.24  CALCIUM 8.7 8.9 9.1  MG 2.0  --   --  Liver Function Tests No results for input(s): AST, ALT, ALKPHOS, BILITOT, PROT, ALBUMIN in the last 72 hours. No results for input(s): LIPASE, AMYLASE in the last 72 hours. Cardiac Enzymes  Recent Labs  02/20/15 1600 02/20/15 2245 02/21/15 0558  TROPONINI 0.06* 0.06* 0.05*   BNP Invalid input(s): POCBNP D-Dimer No results for input(s): DDIMER in the last 72 hours. Hemoglobin A1C No results for input(s): HGBA1C in the last 72 hours. Fasting Lipid Panel  Recent Labs  02/23/15 0525  CHOL 170  HDL 32*  LDLCALC 122*  TRIG 78  CHOLHDL 5.3   Thyroid Function Tests  Recent Labs  02/20/15 2245  TSH 1.538    Radiology Studies Imaging results have been reviewed and No results found.  TELE NSR   ASSESSMENT AND PLAN  1. Ischemic  cardiomyopathy - cardiac cath and possible PTCA/stent in AM. This procedure has been fully reviewed with the patient and written informed consent has been obtained. He has had periods of severe indigestion that he has attributed to GERD and treated with Gaviscon and Dexilant - a particularly bad one a month ago may have been an apical infarction?  2. Paroxysmal atrial fibrillation - review of strips/ECG more c/w afib than flutter and the severely dilated LA supports afib. Not on anticoagulants now for cath tomorrow. Severely dilated left atrium ensures arrhythmia recurrence. Choice of antiarrhythmic depends on cath findings/revascularization strategy/DAPT prescription.  3. HTN - probably cause of marked LA dilation  4. DM type 2 with borderline renal abnormalities - IV fluids overnight  5. Social issues. His mother is hospitalized on 6E and he is trying to coordinate her care as well as his own. He states he would like his girlfriend [name] to have medical POA. I encouraged them to fill out the necessary paperwork before the procedure tomorrow.  6  Syncope insetting of AF with extreme RVR  [name], MD, [facility] [phone] office [phone] pager 02/23/2015 8:46 AM

## 2015-02-24 ENCOUNTER — Encounter (HOSPITAL_COMMUNITY)
Admission: EM | Disposition: A | Discharge status: Home / Self Care | Payer: Self-pay | Source: Home / Self Care | Attending: Cardiology

## 2015-02-24 DIAGNOSIS — I2510 Atherosclerotic heart disease of native coronary artery without angina pectoris: Principal | ICD-10-CM

## 2015-02-24 HISTORY — PX: CARDIAC CATHETERIZATION: SHX172

## 2015-02-24 LAB — GLUCOSE, CAPILLARY
GLUCOSE-CAPILLARY: 132 mg/dL — AB (ref 70–99)
Glucose-Capillary: 121 mg/dL — ABNORMAL HIGH (ref 70–99)
Glucose-Capillary: 112 mg/dL — ABNORMAL HIGH (ref 70–99)
Glucose-Capillary: 124 mg/dL — ABNORMAL HIGH (ref 70–99)

## 2015-02-24 LAB — POCT ACTIVATED CLOTTING TIME: ACTIVATED CLOTTING TIME: 521 s

## 2015-02-24 LAB — PROTIME-INR
INR: 1.27 (ref 0.00–1.49)
Prothrombin Time: 16 seconds — ABNORMAL HIGH (ref 11.6–15.2)

## 2015-02-24 SURGERY — LEFT HEART CATH AND CORONARY ANGIOGRAPHY
Anesthesia: LOCAL

## 2015-02-24 MED ORDER — SODIUM CHLORIDE 0.9 % IJ SOLN: 3.0000 mL | INTRAMUSCULAR | Status: DC | PRN

## 2015-02-24 MED ORDER — MORPHINE SULFATE 2 MG/ML IJ SOLN: 2.0000 mg | INTRAMUSCULAR | Status: DC | PRN

## 2015-02-24 MED ORDER — RIVAROXABAN 20 MG PO TABS
20.0000 mg | ORAL_TABLET | Freq: Every day | ORAL | Status: DC
  Administered 2015-02-24 – 2015-02-25 (×2): 20 mg via ORAL
  Filled 2015-02-24 (×2): qty 1

## 2015-02-24 MED ORDER — TICAGRELOR 90 MG PO TABS
ORAL_TABLET | ORAL | Status: DC | PRN
  Administered 2015-02-24: 180 mg via ORAL

## 2015-02-24 MED ORDER — BIVALIRUDIN BOLUS VIA INFUSION: INTRAVENOUS | Status: DC | PRN

## 2015-02-24 MED ORDER — TICAGRELOR 90 MG PO TABS
ORAL_TABLET | ORAL | Status: AC
  Filled 2015-02-24: qty 2

## 2015-02-24 MED ORDER — IOHEXOL 350 MG/ML SOLN
INTRAVENOUS | Status: DC | PRN
  Administered 2015-02-24: 100 mL via INTRA_ARTERIAL
  Administered 2015-02-24: 125 mL via INTRA_ARTERIAL

## 2015-02-24 MED ORDER — SPIRONOLACTONE 25 MG PO TABS
25.0000 mg | ORAL_TABLET | Freq: Every day | ORAL | Status: DC
  Administered 2015-02-24: 25 mg via ORAL
  Filled 2015-02-24 (×2): qty 1

## 2015-02-24 MED ORDER — ASPIRIN 81 MG PO CHEW
81.0000 mg | CHEWABLE_TABLET | Freq: Every day | ORAL | Status: DC
  Administered 2015-02-25: 81 mg via ORAL
  Filled 2015-02-24: qty 1

## 2015-02-24 MED ORDER — SODIUM CHLORIDE 0.9 % IJ SOLN: 3.0000 mL | Freq: Two times a day (BID) | INTRAMUSCULAR | Status: DC

## 2015-02-24 MED ORDER — MIDAZOLAM HCL 2 MG/2ML IJ SOLN
INTRAMUSCULAR | Status: AC
  Filled 2015-02-24: qty 2

## 2015-02-24 MED ORDER — HEPARIN (PORCINE) IN NACL 2-0.9 UNIT/ML-% IJ SOLN
INTRAMUSCULAR | Status: AC
  Filled 2015-02-24: qty 1500

## 2015-02-24 MED ORDER — MIDAZOLAM HCL 2 MG/2ML IJ SOLN
INTRAMUSCULAR | Status: DC | PRN
  Administered 2015-02-24 (×2): 2 mg via INTRAVENOUS

## 2015-02-24 MED ORDER — NITROGLYCERIN 1 MG/10 ML FOR IR/CATH LAB
INTRA_ARTERIAL | Status: AC
  Filled 2015-02-24: qty 20

## 2015-02-24 MED ORDER — HEPARIN SODIUM (PORCINE) 1000 UNIT/ML IJ SOLN
INTRAMUSCULAR | Status: AC
  Filled 2015-02-24: qty 1

## 2015-02-24 MED ORDER — METOPROLOL TARTRATE 100 MG PO TABS
100.0000 mg | ORAL_TABLET | Freq: Two times a day (BID) | ORAL | Status: DC
  Administered 2015-02-24 – 2015-02-25 (×2): 100 mg via ORAL
  Filled 2015-02-24 (×2): qty 1

## 2015-02-24 MED ORDER — HEPARIN SODIUM (PORCINE) 1000 UNIT/ML IJ SOLN
INTRAMUSCULAR | Status: DC | PRN
  Administered 2015-02-24: 4000 [IU] via INTRAVENOUS
  Administered 2015-02-24: 4500 [IU] via INTRAVENOUS

## 2015-02-24 MED ORDER — FUROSEMIDE 40 MG PO TABS
40.0000 mg | ORAL_TABLET | Freq: Every day | ORAL | Status: DC
  Administered 2015-02-24: 40 mg via ORAL
  Filled 2015-02-24: qty 1

## 2015-02-24 MED ORDER — FENTANYL CITRATE (PF) 100 MCG/2ML IJ SOLN
INTRAMUSCULAR | Status: AC
  Filled 2015-02-24: qty 2

## 2015-02-24 MED ORDER — LIDOCAINE HCL (PF) 1 % IJ SOLN
INTRAMUSCULAR | Status: AC
  Filled 2015-02-24: qty 60

## 2015-02-24 MED ORDER — SODIUM CHLORIDE 0.9 % IV SOLN: 250.0000 mg | INTRAVENOUS | Status: DC | PRN

## 2015-02-24 MED ORDER — SODIUM CHLORIDE 0.9 % IV SOLN
1.0000 mL/kg/h | INTRAVENOUS | Status: AC
  Administered 2015-02-24: 1 mL/kg/h via INTRAVENOUS

## 2015-02-24 MED ORDER — FENTANYL CITRATE (PF) 100 MCG/2ML IJ SOLN
INTRAMUSCULAR | Status: DC | PRN
  Administered 2015-02-24 (×2): 50 ug via INTRAVENOUS

## 2015-02-24 MED ORDER — VERAPAMIL HCL 2.5 MG/ML IV SOLN
INTRAVENOUS | Status: DC | PRN
  Administered 2015-02-24: 10:00:00 via INTRA_ARTERIAL

## 2015-02-24 MED ORDER — SODIUM CHLORIDE 0.9 % IV SOLN: 250.0000 mL | INTRAVENOUS | Status: DC | PRN

## 2015-02-24 SURGICAL SUPPLY — 16 items
CATH INFINITI 5FR ANG PIGTAIL (CATHETERS) ×3 IMPLANT
CATH OPTITORQUE TIG 4.0 5F (CATHETERS) ×3 IMPLANT
CATH VISTA GUIDE 6FR XBLAD3.5 (CATHETERS) ×1 IMPLANT
DEVICE RAD COMP TR BAND LRG (VASCULAR PRODUCTS) ×3 IMPLANT
GLIDESHEATH SLEND A-KIT 6F 22G (SHEATH) ×3 IMPLANT
KIT ENCORE 26 ADVANTAGE (KITS) ×1 IMPLANT
KIT ESSENTIALS PG (KITS) ×1 IMPLANT
KIT HEART LEFT (KITS) ×3 IMPLANT
PACK CARDIAC CATHETERIZATION (CUSTOM PROCEDURE TRAY) ×3 IMPLANT
SYR MEDRAD MARK V 150ML (SYRINGE) ×3 IMPLANT
TRANSDUCER W/STOPCOCK (MISCELLANEOUS) ×3 IMPLANT
TUBING CIL FLEX 10 FLL-RA (TUBING) ×3 IMPLANT
WIRE ASAHI FIELDER XT 190CM (WIRE) ×1 IMPLANT
WIRE LUGE 182CM (WIRE) ×1 IMPLANT
WIRE ROSEN-J .035X260CM (WIRE) IMPLANT
WIRE SAFE-T 1.5MM-J .035X260CM (WIRE) ×3 IMPLANT

## 2015-02-24 NOTE — Progress Notes (Signed)
Patient Name: Sean Hinton Date of Encounter: 02/24/2015  Principal Problem:   Syncope Active Problems:   Essential hypertension   Hyperlipidemia   Atrial flutter with rapid ventricular response   Left ventricular systolic dysfunction   Tobacco use   Diabetes mellitus without complication   Atrial fibrillation and flutter   Cardiomyopathy, ischemic   Length of Stay:   SUBJECTIVE  No angina, mild dyspnea at times. No syncope, now arrhythmia on monitor  CURRENT MEDS . atorvastatin  40 mg Oral q1800  . enoxaparin (LOVENOX) injection  1 mg/kg Subcutaneous Q12H  . glipiZIDE  5 mg Oral BID AC  . hydrALAZINE  75 mg Oral TID  . insulin aspart  0-15 Units Subcutaneous TID WC  . lisinopril  40 mg Oral Daily  . living well with diabetes book   Does not apply Once  . metoprolol tartrate  75 mg Oral BID  . sodium chloride  3 mL Intravenous Q12H  . triamterene-hydrochlorothiazide  1 capsule Oral Daily    OBJECTIVE   Intake/Output Summary (Last 24 hours) at 02/24/15 0904 Last data filed at 02/24/15 0500  Gross per 24 hour  Intake      0 ml  Output    400 ml  Net   -400 ml   Filed Weights   02/21/15 0443 02/23/15 0530 02/24/15 0400  Weight: 187 lb (84.823 kg) 183 lb 11.2 oz (83.326 kg) 184 lb 4.8 oz (83.598 kg)    PHYSICAL EXAM Filed Vitals:   02/23/15 2000 02/24/15 0038 02/24/15 0400 02/24/15 0808  BP: 153/98 133/84 143/87 154/106  Pulse: 82 73 75 84  Temp: 98.9 F (37.2 C) 98.7 F (37.1 C) 97.8 F (36.6 C) 98.6 F (37 C)  TempSrc: Oral Oral Oral Oral  Resp: 18 18 20 18   Height:      Weight:   184 lb 4.8 oz (83.598 kg)   SpO2: 100% 99% 99% 100%   General: Alert, oriented x3, no distress Head: no evidence of trauma, PERRL, EOMI, no exophtalmos or lid lag, no myxedema, no xanthelasma; normal ears, nose and oropharynx Neck: normal jugular venous pulsations and no hepatojugular reflux; brisk carotid pulses without delay and no carotid bruits Chest: clear to  auscultation, no signs of consolidation by percussion or palpation, normal fremitus, symmetrical and full respiratory excursions Cardiovascular: normal position and quality of the apical impulse, regular rhythm, normal first and second heart sounds, no rubs or gallops, no murmur Abdomen: no tenderness or distention, no masses by palpation, no abnormal pulsatility or arterial bruits, normal bowel sounds, no hepatosplenomegaly Extremities: no clubbing, cyanosis or edema; 2+ radial, ulnar and brachial pulses bilaterally; 2+ right femoral, posterior tibial and dorsalis pedis pulses; 2+ left femoral, posterior tibial and dorsalis pedis pulses; no subclavian or femoral bruits Neurological: grossly nonfocal  LABS  CBC  Recent Labs  02/23/15 0525  WBC 9.8  HGB 16.5  HCT 48.9  MCV 84.3  PLT 189   Basic Metabolic Panel  Recent Labs  02/23/15 0525  NA 138  K 3.7  CL 106  CO2 22  GLUCOSE 156*  BUN 16  CREATININE 1.24  CALCIUM 9.1   Liver Function Tests No results for input(s): AST, ALT, ALKPHOS, BILITOT, PROT, ALBUMIN in the last 72 hours. No results for input(s): LIPASE, AMYLASE in the last 72 hours. Cardiac Enzymes No results for input(s): CKTOTAL, CKMB, CKMBINDEX, TROPONINI in the last 72 hours. BNP Invalid input(s): POCBNP D-Dimer No results for input(s): DDIMER in the last 72  hours. Hemoglobin A1C No results for input(s): HGBA1C in the last 72 hours. Fasting Lipid Panel  Recent Labs  02/23/15 0525  CHOL 170  HDL 32*  LDLCALC 122*  TRIG 78  CHOLHDL 5.3   Thyroid Function Tests No results for input(s): TSH, T4TOTAL, T3FREE, THYROIDAB in the last 72 hours.  Invalid input(s): FREET3  Radiology Studies Imaging results have been reviewed and No results found.  TELE NSR   ASSESSMENT AND PLAN  1. Ischemic cardiomyopathy - LV dysfunction with regional wall motion abnormalities suggestive of prior LAD infarction (+/- RCA). Cardiac cath and possible PTCA/stent  today. This procedure has been fully reviewed with the patient and written informed consent has been obtained. He has had periods of severe indigestion that he has attributed to GERD and treated with Gaviscon and Dexilant - a particularly bad one a month ago may have been an apical infarction?  2. Paroxysmal atrial fibrillation - review of strips/ECG more c/w afib than flutter and the severely dilated LA supports afib. Not on anticoagulants now for cath tomorrow. Severely dilated left atrium ensures arrhythmia recurrence. Choice of antiarrhythmic depends on cath findings/revascularization strategy/DAPT prescription.  3. HTN - probably cause of marked LA dilation  4. DM type 2 with borderline renal abnormalities - IV fluids overnight  5. Social issues. His mother is hospitalized on 6E and he is trying to coordinate her care as well as his own. He states he would like his girlfriend Nicole to have medical POA.  6 Syncope insetting of AF with extreme RVR   Chae Oommen, MD, FACC [facility] [phone] office [phone] pager 02/24/2015 9:04 AM   

## 2015-02-24 NOTE — Interval H&P Note (Signed)
History and Physical Interval Note:  02/24/2015 9:37 AM  Sean Hinton  has presented today for surgery, with the diagnosis of unstable angina & new Dx Cardiomyopathy.  The various methods of treatment have been discussed with the patient and family. After consideration of risks, benefits and other options for treatment, the patient has consented to  Procedure(s): Left Heart Cath and Coronary Angiography (N/A) as a surgical intervention .  The patient's history has been reviewed, patient examined, no change in status, stable for surgery.  I have reviewed the patient's chart and labs.  Questions were answered to the patient's satisfaction.     Cath Lab Visit (complete for each Cath Lab visit)  Clinical Evaluation Leading to the Procedure:   ACS: No.  Non-ACS:    Anginal Classification: CCS III  Anti-ischemic medical therapy: Minimal Therapy (1 class of medications)  Non-Invasive Test Results: Intermediate-risk stress test findings: cardiac mortality 1-3%/year; EF 40-45% with Anterior WMA  Prior CABG: No previous CABG   HARDING, DAVID W

## 2015-02-24 NOTE — Progress Notes (Signed)
ANTICOAGULATION CONSULT NOTE - Follow Up Consult  Pharmacy Consult for Xarelto Indication: atrial fibrillation  No Known Allergies  Patient Measurements: Height: 5\' 10"  (177.8 cm) Weight: 184 lb 4.8 oz (83.598 kg) IBW/kg (Calculated) : 73  Vital Signs: Temp: 98.7 F (37.1 C) (05/02 1330) Temp Source: Oral (05/02 1330) BP: 150/109 mmHg (05/02 1430) Pulse Rate: 69 (05/02 1330)  Labs:  Recent Labs  02/23/15 0525 02/24/15 0455  HGB 16.5  --   HCT 48.9  --   PLT 189  --   LABPROT  --  16.0*  INR  --  1.27  CREATININE 1.24  --     Estimated Creatinine Clearance: 72.8 mL/min (by C-G formula based on Cr of 1.24).  Assessment:  s/p cardiac cath.  To resume Xarelto tonight.  Received Xarelto 20 mg x 1 on 02/20/15, then changed to Lovenox for cath.  Last Lovenox dose 5/1 at 6pm. Held this am for cath.  Sheath out ~11am. RN reports site okay.  Spoke briefly with Dr. 02/22/15. OK to resume Xarelto 4 hrs post-sheath pull.  Goal of Therapy:  full-anticoagulation with Xarelto Monitor platelets by anticoagulation protocol: Yes   Plan:   Xarelto 20 mg daily with supper to resume today.  Royann Shivers, RPh Pager: [phone] 02/24/2015,2:48 PM

## 2015-02-24 NOTE — H&P (View-Only) (Signed)
Patient Name: Sean Hinton Date of Encounter: 02/24/2015  Principal Problem:   Syncope Active Problems:   Essential hypertension   Hyperlipidemia   Atrial flutter with rapid ventricular response   Left ventricular systolic dysfunction   Tobacco use   Diabetes mellitus without complication   Atrial fibrillation and flutter   Cardiomyopathy, ischemic   Length of Stay:   SUBJECTIVE  No angina, mild dyspnea at times. No syncope, now arrhythmia on monitor  CURRENT MEDS . atorvastatin  40 mg Oral q1800  . enoxaparin (LOVENOX) injection  1 mg/kg Subcutaneous Q12H  . glipiZIDE  5 mg Oral BID AC  . hydrALAZINE  75 mg Oral TID  . insulin aspart  0-15 Units Subcutaneous TID WC  . lisinopril  40 mg Oral Daily  . living well with diabetes book   Does not apply Once  . metoprolol tartrate  75 mg Oral BID  . sodium chloride  3 mL Intravenous Q12H  . triamterene-hydrochlorothiazide  1 capsule Oral Daily    OBJECTIVE   Intake/Output Summary (Last 24 hours) at 02/24/15 0904 Last data filed at 02/24/15 0500  Gross per 24 hour  Intake      0 ml  Output    400 ml  Net   -400 ml   Filed Weights   02/21/15 0443 02/23/15 0530 02/24/15 0400  Weight: 187 lb (84.823 kg) 183 lb 11.2 oz (83.326 kg) 184 lb 4.8 oz (83.598 kg)    PHYSICAL EXAM Filed Vitals:   02/23/15 2000 02/24/15 0038 02/24/15 0400 02/24/15 0808  BP: 153/98 133/84 143/87 154/106  Pulse: 82 73 75 84  Temp: 98.9 F (37.2 C) 98.7 F (37.1 C) 97.8 F (36.6 C) 98.6 F (37 C)  TempSrc: Oral Oral Oral Oral  Resp: 18 18 20 18   Height:      Weight:   184 lb 4.8 oz (83.598 kg)   SpO2: 100% 99% 99% 100%   General: Alert, oriented x3, no distress Head: no evidence of trauma, PERRL, EOMI, no exophtalmos or lid lag, no myxedema, no xanthelasma; normal ears, nose and oropharynx Neck: normal jugular venous pulsations and no hepatojugular reflux; brisk carotid pulses without delay and no carotid bruits Chest: clear to  auscultation, no signs of consolidation by percussion or palpation, normal fremitus, symmetrical and full respiratory excursions Cardiovascular: normal position and quality of the apical impulse, regular rhythm, normal first and second heart sounds, no rubs or gallops, no murmur Abdomen: no tenderness or distention, no masses by palpation, no abnormal pulsatility or arterial bruits, normal bowel sounds, no hepatosplenomegaly Extremities: no clubbing, cyanosis or edema; 2+ radial, ulnar and brachial pulses bilaterally; 2+ right femoral, posterior tibial and dorsalis pedis pulses; 2+ left femoral, posterior tibial and dorsalis pedis pulses; no subclavian or femoral bruits Neurological: grossly nonfocal  LABS  CBC  Recent Labs  02/23/15 0525  WBC 9.8  HGB 16.5  HCT 48.9  MCV 84.3  PLT 189   Basic Metabolic Panel  Recent Labs  02/23/15 0525  NA 138  K 3.7  CL 106  CO2 22  GLUCOSE 156*  BUN 16  CREATININE 1.24  CALCIUM 9.1   Liver Function Tests No results for input(s): AST, ALT, ALKPHOS, BILITOT, PROT, ALBUMIN in the last 72 hours. No results for input(s): LIPASE, AMYLASE in the last 72 hours. Cardiac Enzymes No results for input(s): CKTOTAL, CKMB, CKMBINDEX, TROPONINI in the last 72 hours. BNP Invalid input(s): POCBNP D-Dimer No results for input(s): DDIMER in the last 72  hours. Hemoglobin A1C No results for input(s): HGBA1C in the last 72 hours. Fasting Lipid Panel  Recent Labs  02/23/15 0525  CHOL 170  HDL 32*  LDLCALC 122*  TRIG 78  CHOLHDL 5.3   Thyroid Function Tests No results for input(s): TSH, T4TOTAL, T3FREE, THYROIDAB in the last 72 hours.  Invalid input(s): FREET3  Radiology Studies Imaging results have been reviewed and No results found.  TELE NSR   ASSESSMENT AND PLAN  1. Ischemic cardiomyopathy - LV dysfunction with regional wall motion abnormalities suggestive of prior LAD infarction (+/- RCA). Cardiac cath and possible PTCA/stent  today. This procedure has been fully reviewed with the patient and written informed consent has been obtained. He has had periods of severe indigestion that he has attributed to GERD and treated with Gaviscon and Dexilant - a particularly bad one a month ago may have been an apical infarction?  2. Paroxysmal atrial fibrillation - review of strips/ECG more c/w afib than flutter and the severely dilated LA supports afib. Not on anticoagulants now for cath tomorrow. Severely dilated left atrium ensures arrhythmia recurrence. Choice of antiarrhythmic depends on cath findings/revascularization strategy/DAPT prescription.  3. HTN - probably cause of marked LA dilation  4. DM type 2 with borderline renal abnormalities - IV fluids overnight  5. Social issues. His mother is hospitalized on 6E and he is trying to coordinate her care as well as his own. He states he would like his girlfriend Sean Hinton to have medical POA.  6 Syncope insetting of AF with extreme RVR   Sean Fair, MD, [facility] [phone] office [phone] pager 02/24/2015 9:04 AM

## 2015-02-25 ENCOUNTER — Inpatient Hospital Stay (HOSPITAL_COMMUNITY): Payer: Self-pay

## 2015-02-25 ENCOUNTER — Telehealth: Payer: Self-pay | Admitting: Physician Assistant

## 2015-02-25 ENCOUNTER — Other Ambulatory Visit: Payer: Self-pay | Admitting: Physician Assistant

## 2015-02-25 ENCOUNTER — Encounter (HOSPITAL_COMMUNITY): Payer: Self-pay | Admitting: Cardiology

## 2015-02-25 DIAGNOSIS — I255 Ischemic cardiomyopathy: Secondary | ICD-10-CM

## 2015-02-25 LAB — CBC
HCT: 47.2 % (ref 39.0–52.0)
HEMOGLOBIN: 16.7 g/dL (ref 13.0–17.0)
MCH: 29.5 pg (ref 26.0–34.0)
MCHC: 35.4 g/dL (ref 30.0–36.0)
MCV: 83.2 fL (ref 78.0–100.0)
Platelets: 160 10*3/uL (ref 150–400)
RBC: 5.67 MIL/uL (ref 4.22–5.81)
RDW: 14.4 % (ref 11.5–15.5)
WBC: 8.2 10*3/uL (ref 4.0–10.5)

## 2015-02-25 LAB — BASIC METABOLIC PANEL
ANION GAP: 11 (ref 5–15)
BUN: 15 mg/dL (ref 6–20)
CALCIUM: 9.2 mg/dL (ref 8.9–10.3)
CO2: 22 mmol/L (ref 22–32)
Chloride: 103 mmol/L (ref 101–111)
Creatinine, Ser: 1.17 mg/dL (ref 0.61–1.24)
GFR calc Af Amer: >60 mL/min (ref >60)
GLUCOSE: 279 mg/dL — AB (ref 70–99)
Potassium: 4.3 mmol/L (ref 3.5–5.1)
Sodium: 136 mmol/L (ref 135–145)

## 2015-02-25 LAB — GLUCOSE, CAPILLARY
GLUCOSE-CAPILLARY: 141 mg/dL — AB (ref 70–99)
Glucose-Capillary: 159 mg/dL — ABNORMAL HIGH (ref 70–99)

## 2015-02-25 MED ORDER — RIVAROXABAN 20 MG PO TABS: 20.0000 mg | ORAL_TABLET | Freq: Every day | ORAL | Status: AC

## 2015-02-25 MED ORDER — ATORVASTATIN CALCIUM 80 MG PO TABS
80.0000 mg | ORAL_TABLET | Freq: Every day | ORAL | Status: DC
Start: 2015-02-25 — End: 2015-02-25

## 2015-02-25 MED ORDER — ATORVASTATIN CALCIUM 80 MG PO TABS: 80.0000 mg | ORAL_TABLET | Freq: Every evening | ORAL | Status: DC

## 2015-02-25 MED ORDER — NITROGLYCERIN 0.4 MG SL SUBL: 0.4000 mg | SUBLINGUAL_TABLET | SUBLINGUAL | Status: AC | PRN

## 2015-02-25 MED ORDER — HYDRALAZINE HCL 50 MG PO TABS: 75.0000 mg | ORAL_TABLET | Freq: Three times a day (TID) | ORAL | Status: DC

## 2015-02-25 MED ORDER — GADOBENATE DIMEGLUMINE 529 MG/ML IV SOLN
30.0000 mL | Freq: Once | INTRAVENOUS | Status: AC
  Administered 2015-02-25: 28 mL via INTRAVENOUS

## 2015-02-25 MED ORDER — METOPROLOL TARTRATE 100 MG PO TABS: 100.0000 mg | ORAL_TABLET | Freq: Two times a day (BID) | ORAL | Status: DC

## 2015-02-25 MED ORDER — SPIRONOLACTONE 25 MG PO TABS: 25.0000 mg | ORAL_TABLET | Freq: Every day | ORAL | Status: DC

## 2015-02-25 MED ORDER — FUROSEMIDE 40 MG PO TABS: 40.0000 mg | ORAL_TABLET | Freq: Every day | ORAL | Status: DC

## 2015-02-25 MED ORDER — GLIPIZIDE 5 MG PO TABS: 5.0000 mg | ORAL_TABLET | Freq: Two times a day (BID) | ORAL | Status: DC

## 2015-02-25 MED FILL — Perflutren Lipid Microsphere IV Susp 1.1 MG/ML: INTRAVENOUS | Qty: 10 | Status: AC

## 2015-02-25 NOTE — Telephone Encounter (Signed)
New message       TCM appt on 03-04-15 per Dayna with BMET

## 2015-02-25 NOTE — Progress Notes (Addendum)
Patient Name: Sean Hinton Date of Encounter: 02/25/2015  Principal Problem:   Syncope Active Problems:   Essential hypertension   Hyperlipidemia   Atrial flutter with rapid ventricular response   Left ventricular systolic dysfunction   Tobacco use   Diabetes mellitus without complication   Atrial fibrillation and flutter   Cardiomyopathy, ischemic   CAD (coronary artery disease)   CAD in native artery   Length of Stay: 1  SUBJECTIVE  No palpitations/syncope or angina, occasional mild dyspnea when walking and talking.   CURRENT MEDS . aspirin  81 mg Oral Daily  . atorvastatin  40 mg Oral q1800  . furosemide  40 mg Oral Daily  . glipiZIDE  5 mg Oral BID AC  . hydrALAZINE  75 mg Oral TID  . insulin aspart  0-15 Units Subcutaneous TID WC  . lisinopril  40 mg Oral Daily  . living well with diabetes book   Does not apply Once  . metoprolol tartrate  100 mg Oral BID  . rivaroxaban  20 mg Oral Q supper  . sodium chloride  3 mL Intravenous Q12H  . spironolactone  25 mg Oral Daily    OBJECTIVE   Intake/Output Summary (Last 24 hours) at 02/25/15 0806 Last data filed at 02/24/15 2200  Gross per 24 hour  Intake    600 ml  Output    525 ml  Net     75 ml   Filed Weights   02/23/15 0530 02/24/15 0400 02/25/15 0519  Weight: 183 lb 11.2 oz (83.326 kg) 184 lb 4.8 oz (83.598 kg) 181 lb 11.2 oz (82.419 kg)    PHYSICAL EXAM Filed Vitals:   02/24/15 1500 02/24/15 1644 02/24/15 2048 02/25/15 0519  BP: 142/85 148/89 147/97 134/87  Pulse:  84 78 72  Temp:   97.9 F (36.6 C) 98.1 F (36.7 C)  TempSrc:   Oral Oral  Resp:  18 16 18   Height:      Weight:    181 lb 11.2 oz (82.419 kg)  SpO2:  98% 100% 99%   General: Alert, oriented x3, no distress Head: no evidence of trauma, PERRL, EOMI, no exophtalmos or lid lag, no myxedema, no xanthelasma; normal ears, nose and oropharynx Neck: normal jugular venous pulsations and no hepatojugular reflux; brisk carotid pulses without  delay and no carotid bruits Chest: clear to auscultation, no signs of consolidation by percussion or palpation, normal fremitus, symmetrical and full respiratory excursions Cardiovascular: normal position and quality of the apical impulse, regular rhythm, normal first and second heart sounds, no rubs or gallops, no murmur Abdomen: no tenderness or distention, no masses by palpation, no abnormal pulsatility or arterial bruits, normal bowel sounds, no hepatosplenomegaly Extremities: no clubbing, cyanosis or edema; 2+ radial, ulnar and brachial pulses bilaterally; 2+ right femoral, posterior tibial and dorsalis pedis pulses; 2+ left femoral, posterior tibial and dorsalis pedis pulses; no subclavian or femoral bruits. Healthy radial cath site Neurological: grossly nonfocal  LABS  CBC  Recent Labs  02/23/15 0525 02/25/15 0434  WBC 9.8 8.2  HGB 16.5 16.7  HCT 48.9 47.2  MCV 84.3 83.2  PLT 189 160   Basic Metabolic Panel  Recent Labs  02/23/15 0525  NA 138  K 3.7  CL 106  CO2 22  GLUCOSE 156*  BUN 16  CREATININE 1.24  CALCIUM 9.1   Recent Labs  02/23/15 0525  CHOL 170  HDL 32*  LDLCALC 122*  TRIG 78  CHOLHDL 5.3   Radiology Studies  Imaging results have been reviewed and No results found.  TELE NSR   ASSESSMENT AND PLAN  Mild ischemic cardiomyopathy on background of hypertensive heart disease with apical hypokinesis and total occlusion of the LAD artery, complicated by atrial fibrillation with RVR and syncope.  Now on high dose beta blocker, ACE inhibitor, spironolactone and low dose furosemide for cardiomyopathy, Xarelto for stroke prevention and statin.  PLAN: No work x 2 weeks. TCM 1-2 weeks. Stop ASA, may restart if we decide to try PCI for LAD CTO, depending on MRI results. BMET in 1-2 weeks after starting furosemide and spironolactone. Target LDL <70. Sodium restriction Daily weights. Smoking cessation.  [name], MD, [facility] [phone] 02/25/2015, 8:34 AM   [name], MD, [facility] [phone] office [phone] pager 02/25/2015 8:06 AM

## 2015-02-25 NOTE — Discharge Summary (Signed)
Discharge Summary   Patient ID: Sean Hinton MRN: [id number], DOB/AGE: 12/03/1960 54 y.o. Admit date: 02/20/2015 D/C date:     02/25/2015  Primary Care Provider: Doris Cheadle, MD Primary Cardiologist: Dr. Royann Shivers (requested by patient - he also follows patient's mother, and Dr. Salena Saner followed the patient this admission)  Primary Discharge Diagnoses:  1. Syncope felt due to rapid atrial fib/flutter 2. Newly diagnosed atrial fib/atrial flutter with rapid ventricular response 3. Ischemic cardiomyopathy EF 35-45% 4. Coronary artery disease - managed medically by cath 5. Hypertensive heart disease 6. Essential HTN 7. Hyperlipidemia 8. Diabetes mellitus 9. Moderate mitral regurgitation 10. Tobacco abuse  Hospital Course: Mr. Cave is a 54 y/o M with history HTN, 30+ year history of tobacco use and poorly controlled DM (last Hgb A1c ~8.0), but no prior cardiac history presenting to the [facility] ER with syncope in the setting of atrial flutter/fibrillation w/ RVR. He denies history of alcohol or drug use. His mother is a patient of Dr. Royann Shivers'- she has coronary disease. He had a brother that died of SCD at the age of 42 while refereeing a college basketball game at [employer] in 2011.   He was in his usual state of health until earlier today around 12 noon. He had just finished lunch at work. He was standing at his work station typing on the computer when he suddenly felt lightheaded, hot and diaphoretic. He suddenly passed out and collapsed to the floor. The event was witnessed by co-workers. He had lost consciousness for roughly 15-30 seconds. When he awoke, he felt fine. He denied chest pain, palpitations, and dyspnea. His lightheadedness resolved. EMS was called. On arrival, EKG showed a rapid HR in the 200s. He received adenosine 6mg  followed by 12mg  with no change in his rhythm. He then received cardizem 20 mg and his rate improved. He was transported to [facility] ED for further evaluation. EKG  strips revealed atrial flutter/ fibrillation w/ a RVR in the 170s. IV Cardizem was continued in the ED and he converted to NSR. He was asymptomatic at time of cardiology evaluation. CXR is unremarkable. K 3.5. POC troponin was negative. Syncope was felt due to rapid afib/flutter (up to 240bpm). QT was normal and it was felt that no accessory pathway was present. Exam was normal. He converted to NSR spntaneously on diltiazem. He was initially started on Xarelto - however, this was held and he was placed on Lovenox when echo showed +WMA. 2D Echo showed EF 40-45% with severe HK of the apicalanteroseptal, anterior, inferior, and apical myocardium, elevated LA filling pressure, mod MR, severely dilated LA, atrial septum bowed from left to right, consistentwith increased left atrial pressure. Although stress-induced cardiomyopathy was considered, given WMA and family history the decision was made to pursue cardiac catheterization the next available date on Monday.   Labwork this also showed normal CBC/BMET (except elevated glucose), troponin 0.04->0.06->0.05. Tchol 170, trig 78, HDL 32, LDL 122. CXR showed no acute CP disease. Cardiac cath 02/24/15 showed:  Severe 2 vessel disease with 100% CTO of the mid LAD with diffuse disease in the PDA.  Mid LAD lesion, 100% stenosed. There is a 100% residual stenosis post intervention.  RPDA-1 lesion, 50% stenosed.  RPDA-2 lesion, 70% stenosed.  Ost 1st Mrg to 1st Mrg lesion, 80% stenosed. Very small vessel  EF 35-40% by visual estimate  His med regimen was optimized with high dose beta blocker, ACE inhibitor, spironolactone and low dose furosemide for cardiomyopathy, Xarelto for stroke prevention and statin.  Cardiac MRI was ordered to determine if PCI of LAD CTO would be appropriate. This showed normal LV size with moderate LVH, EF 46%, +WMA, and "Delayed enhancement pattern suggests that the mid anteroseptum,  apical septum, and true apex are unlikely to show  significant improvement with revascularization. The apical inferior wall has the potential to show some improvement in function with revascularization." Dr. Royann Shivers reviewed this report and felt there was not enough to warrant PCI. Aspirin was stopped and Xarelto will be continued. No other significant arrhythmias were noted this admission. Since the patient is feeling better today, Dr. Royann Shivers has seen and examined the patient today and feels he is stable for discharge. CHADSVASC = 4.  He is asked to remain out of work for 2 weeks - work note given. He was asked not to return to driving until cleared by his cardiologist. Sodium restriction, daily weights, smoking cessation were encouraged. Glipize increased to 5mg  BID during admission for improved CBG control. TOC visit scheduled in 1 week with BMET given Lasix/spiro initiation. Will also need arrangement of f/u LFTs/lipids 6-8 weeks at follow-up appt. 30 day free card given for Xarelto.   Discharge Vitals: Blood pressure 135/97, pulse 73, temperature 98.9 F (37.2 C), temperature source Oral, resp. rate 18, height 5\' 10"  (1.778 m), weight 181 lb 11.2 oz (82.419 kg), SpO2 100 %.  Labs: Lab Results  Component Value Date   WBC 8.2 02/25/2015   HGB 16.7 02/25/2015   HCT 47.2 02/25/2015   MCV 83.2 02/25/2015   PLT 160 02/25/2015    Recent Labs Lab 02/25/15 0846  NA 136  K 4.3  CL 103  CO2 22  BUN 15  CREATININE 1.17  CALCIUM 9.2  GLUCOSE 279*    Lab Results  Component Value Date   CHOL 170 02/23/2015   HDL 32* 02/23/2015   LDLCALC 122* 02/23/2015   TRIG 78 02/23/2015    Diagnostic Studies/Procedures   Dg Chest Port 1 View  02/20/2015   CLINICAL DATA:  Syncope.  EXAM: PORTABLE CHEST - 1 VIEW  COMPARISON:  None.  FINDINGS: The heart size and mediastinal contours are within normal limits. Both lungs are clear. No pneumothorax or pleural effusion is noted. The visualized skeletal structures are unremarkable.  IMPRESSION: No acute  cardiopulmonary abnormality seen.   Electronically Signed   By: 04/25/2015, M.D.   On: 02/20/2015 15:38   Mr Card Morphology Wo/w Cm  02/25/2015   CLINICAL DATA:  Ischemic cardiomyopathy, assess viability  EXAM: CARDIAC MRI  TECHNIQUE: The patient was scanned on a 1.5 Tesla GE magnet. A dedicated cardiac coil was used. Functional imaging was done using Fiesta sequences. 2,3, and 4 chamber views were done to assess for RWMA's. Modified Simpson's rule using a short axis stack was used to calculate an ejection fraction on a dedicated work 02/22/2015. The patient received 28 cc of Multihance. After 10 minutes inversion recovery sequences were used to assess for infiltration and scar tissue.  CONTRAST:  28 cc Multihance  FINDINGS: No gross abnormalities on limited views of the lung fields.  Normal left ventricular size with moderate LV hypertrophy. The mid anteroseptum, apical septum, and true apex were thin and akinetic. The apical inferior wall was akinetic. The remainder of the wall segments moved normally. EF 46%. Normal right ventricular size and systolic function. Normal right atrial size. The left atrium is mildly dilated. The aortic valve was trileaflet, no stenosis or regurgitation. There did not seem  to be significant mitral regurgitation, at most mild.  On delayed enhancement images, the mid anteroseptum, apical septum, and true apex had full thickness delayed enhancement. The apical inferior wall had 26-50% wall thickness subendocardial delayed enhancement.  MEASUREMENTS: MEASUREMENTS LV EDV 172 mL  LV SV 78 mL  LV EF 46%  IMPRESSION: 1. Normal LV size with moderate LV hypertrophy. EF 46% with wall motion abnormalities as described above.  2. Delayed enhancement pattern suggests that the mid anteroseptum, apical septum, and true apex are unlikely to show significant improvement with revascularization. The apical inferior wall has the potential to show some improvement in function  with revascularization.  Dalton Mclean   Electronically Signed   By: Marca Ancona M.D.   On: 02/25/2015 13:36   Cardiac catheterization this admission, please see full report and above for summary.  2D echo 02/21/15 - Left ventricle: The cavity size was normal. There was mild concentric hypertrophy. Systolic function was mildly to moderately reduced. The estimated ejection fraction was in the range of 40% to 45%. Severe hypokinesis of the apicalanteroseptal, anterior, inferior, and apical myocardium; consistent with ischemia in the distribution of the right coronary and left anterior descending coronary artery. Doppler parameters are consistent with elevated mean left atrial filling pressure. Acoustic contrast opacification revealed no evidence ofthrombus. - Mitral valve: There was moderate regurgitation directed centrally. - Left atrium: The atrium was severely dilated. - Atrial septum: The septum bowed from left to right, consistent with increased left atrial pressure. No defect or patent foramen ovale was identified.  Discharge Medications   Current Discharge Medication List    START taking these medications   Details  atorvastatin (LIPITOR) 80 MG tablet Take 1 tablet (80 mg total) by mouth every evening. Qty: 30 tablet, Refills: 3    furosemide (LASIX) 40 MG tablet Take 1 tablet (40 mg total) by mouth daily. Qty: 30 tablet, Refills: 3    metoprolol (LOPRESSOR) 100 MG tablet Take 1 tablet (100 mg total) by mouth 2 (two) times daily. Qty: 60 tablet, Refills: 3    nitroGLYCERIN (NITROSTAT) 0.4 MG SL tablet Place 1 tablet (0.4 mg total) under the tongue every 5 (five) minutes as needed for chest pain (up to 3 doses). Qty: 25 tablet, Refills: 3    rivaroxaban (XARELTO) 20 MG TABS tablet Take 1 tablet (20 mg total) by mouth daily with supper. Qty: 30 tablet, Refills: 3    spironolactone (ALDACTONE) 25 MG tablet Take 1 tablet (25 mg total) by mouth  daily. Qty: 30 tablet, Refills: 3      CONTINUE these medications which have CHANGED   Details  glipiZIDE (GLUCOTROL) 5 MG tablet Take 1 tablet (5 mg total) by mouth 2 (two) times daily before a meal. Qty: 60 tablet, Refills: 0   Associated Diagnoses: Other specified diabetes mellitus without complications    hydrALAZINE (APRESOLINE) 50 MG tablet Take 1.5 tablets (75 mg total) by mouth 3 (three) times daily. Qty: 135 tablet, Refills: 3   Associated Diagnoses: Essential hypertension      CONTINUE these medications which have NOT CHANGED   Details  acetaminophen (TYLENOL) 325 MG tablet Take 650 mg by mouth every 6 (six) hours as needed for mild pain.    Dexlansoprazole (DEXILANT) 30 MG capsule Take 1 capsule (30 mg total) by mouth daily.    Associated Diagnoses: Gastroesophageal reflux disease, esophagitis presence not specified    lisinopril (PRINIVIL,ZESTRIL) 40 MG tablet Take 1 tablet (40 mg total) by mouth daily.  Associated Diagnoses: Essential hypertension      STOP taking these medications     amLODipine (NORVASC) 10 MG tablet      aspirin 81 MG tablet      triamterene-hydrochlorothiazide (DYAZIDE) 37.5-25 MG per capsule      HYDROcodone-acetaminophen (NORCO/VICODIN) 5-325 MG per tablet - completed course PTA      sucralfate (CARAFATE) 1 GM/10ML suspension - completed course PTA         Disposition   The patient will be discharged in stable condition to home. Discharge Instructions    Diet - low sodium heart healthy    Complete by:  As directed   Diabetic diet     Increase activity slowly    Complete by:  As directed   No driving until you are cleared by your cardiologist. No lifting over 5 lbs for 1 week. No sexual activity for 1 week. You may return to work in 2 weeks. Keep procedure site clean & dry. If you notice increased pain, swelling, bleeding or pus, call/return!  You may shower, but no soaking baths/hot tubs/pools for 1 week.   One of your heart  tests showed weakness of the heart muscle this admission. This may make you more susceptible to weight gain from fluid retention, which can lead to symptoms that we call heart failure. Follow these special instructions 1. Follow a low-salt diet and watch your fluid intake. In general, you should not be taking in more than 2 liters of fluid per day (no more than 8 glasses per day). Some patients are restricted to less than 1.5 liters of fluid per day (no more than 6 glasses per day). This includes sources of water in foods like soup, coffee, tea, milk, etc. 2. Weigh yourself on the same scale at same time of day and keep a log. 3. Call your doctor: (Anytime you feel any of the following symptoms)  - 3-4 pound weight gain in 1-2 days or 2 pounds overnight  - Shortness of breath, with or without a dry hacking cough  - Swelling in the hands, feet or stomach  - If you have to sleep on extra pillows at night in order to breathe  IT IS IMPORTANT TO LET YOUR DOCTOR KNOW EARLY ON IF YOU ARE HAVING SYMPTOMS SO WE CAN HELP YOU!  Please note multiple medicine changes. Refer to your discharge paperwork for your new complete med list.          Follow-up Information    Follow up with Tereso Newcomer, PA-C.   Specialty:  Physician Assistant   Why:  [facility] - 03/04/15 at 9am - this is the [facility] OFFICE. You will have labwork that day.   Contact information:   [address] [place] [address] [phone]       Follow up with Doris Cheadle, MD.   Specialty:  Internal Medicine   Why:  Follow up with PCP for your diabetes management.   Contact information:   [address] [place] [address] [phone]         Duration of Discharge Encounter: Greater than 30 minutes including physician and PA time.  Thomasene Mohair PA-C 02/25/2015, 3:54 PM

## 2015-02-26 NOTE — Telephone Encounter (Signed)
Left message to call back  

## 2015-02-26 NOTE — Telephone Encounter (Signed)
Patient contacted regarding discharge from [facility] on 02/25/2015.  Patient understands to follow up with provider Tereso Newcomer PA on 03/04/2015 at 9:00 at [address] Patient understands discharge instructions? yes Patient understands medications and regiment? yes Patient understands to bring all medications to this visit? yes

## 2015-03-03 DIAGNOSIS — I340 Nonrheumatic mitral (valve) insufficiency: Secondary | ICD-10-CM | POA: Insufficient documentation

## 2015-03-03 NOTE — Progress Notes (Signed)
Cardiology Office Note   Date:  03/04/2015   ID:  Sean Hinton, DOB Dec 17, 1960, MRN [id number]  PCP:  Doris Cheadle, MD  Cardiologist:  Dr. [name]     Chief Complaint  Patient presents with  . Hospitalization Follow-up  . Coronary Artery Disease  . Cardiomyopathy     History of Present Illness: Sean Hinton is a 54 y.o. male with a hx of HTN, tobacco abuse, poorly controlled DM.  Admitted 4/28-5/3 with syncope in the setting of AFib/Flutter with RVR.  Initial HR in the field was 200.  He was given Adenosine initially and then IV diltiazem.  Syncope was felt due to rapid afib/flutter (up to 240bpm). QT was normal and it was felt that no accessory pathway was present.  He converted to NSR.  Troponins were minimally elevated (0.06-0.06-0.05).  Echo demonstrated EF 40-45% with multiple wall motion abnormalities.  LHC was arranged and demonstrated 2 v CAD with CTO of the LAD, OM1 ostial 80%, RPDA-1 50%, RPDA-2 70%.  HF medications were adjusted. Cardiac MRI demonstrated EF 46%, +WMA and delayed enhancement in mid anteroseptum, apical septum, and true apex (unlikely to show improvement with revascularization).  The apical inferior wall had the potential to show some improvement in function with revascularization.  There is fore CTO PCI of the LAD was not recommended.  Patient was placed on Xarelto for [facility].    He is here for follow-up by himself. He is overall doing well. He actually mowed his lawn recently with a push mower. He denies chest discomfort or significant dyspnea. Overall he is NYHA 2. He denies syncope or near-syncope. He denies palpitations or dizziness. He denies orthopnea, PND or edema. He has been adherent with his medications.  Studies/Reports Reviewed Today:  Echo 02/21/15 - Mild concentric hypertrophy. EF 40% to 45%. Severe hypokinesis of the apical-anteroseptal, anterior, inferior, and apical myocardium; consistent with ischemia in the distribution of the right  coronary and left anterior descending coronary artery. Doppler parameters are consistent with elevated mean left atrial filling pressure. Acoustic contrast opacification revealed no evidence of thrombus. - Mitral valve: There was moderate regurgitation directed centrally. - Left atrium: The atrium was severely dilated. - Atrial septum: The septum bowed from left to right, consistent with increased left atrial pressure. No defect or patent foramen ovale was identified.  LHC with attempted PCI 03/03/15 Severe 2 vessel disease with 100% CTO of the mid LAD with diffuse disease in the PDA. Mid LAD lesion, 100% stenosed. Attempted PCI: There is a 100% residual stenosis post intervention. RPDA-1 lesion, 50% stenosed. RPDA-2 lesion, 70% stenosed. Ost 1st Mrg to 1st Mrg lesion, 80% stenosed. Very small vessel Left Ventricle The left ventricular size is normal. There is moderate left ventricular systolic dysfunction. The left ventricular ejection fraction is 35-45% by visual estimate. There are wall motion abnormalities in the left ventricle. Mid Anterior to Apical (both anterior & inferior) Hypokinesis  Mr Card Morphology Wo/w Cm  02/25/2015    IMPRESSION: 1. Normal LV size with moderate LV hypertrophy. EF 46% with wall motion abnormalities as described above.  2. Delayed enhancement pattern suggests that the mid anteroseptum, apical septum, and true apex are unlikely to show significant improvement with revascularization. The apical inferior wall has the potential to show some improvement in function with revascularization.  Dalton Mclean   Electronically Signed   By: Marca Ancona M.D.   On: 02/25/2015 13:36     Past Medical History  Diagnosis Date  . Diabetes  mellitus with complication   . Hypertension   . Syncope     a. 02/2015 - felt 2/2 rapid AF/AFL.  Marland Kitchen Atrial fibrillation and flutter     a. Dx 02/2015 - placed on Xarelto.  . Ischemic cardiomyopathy   . CAD (coronary artery disease)     a. Dx  02/2015 - 2V CAD with CTO LAD, diffuse dz in PDA, OM - med rx.  . Hypertensive heart disease   . Hyperlipidemia   . Mitral regurgitation     a. Mod by echo 02/2015,  . Tobacco abuse     Past Surgical History  Procedure Laterality Date  . Right thumb surgery     . Cardiac catheterization N/A 02/24/2015    Procedure: Left Heart Cath and Coronary Angiography;  Surgeon: Marykay Lex, MD;  Location: [facility] INVASIVE CV LAB CUPID;  Service: Cardiovascular;  Laterality: N/A;  . Cardiac catheterization  02/24/2015    Procedure: Coronary/Bypass Graft CTO Intervention;  Surgeon: Marykay Lex, MD;  Location: [facility] INVASIVE CV LAB CUPID;  Service: Cardiovascular;;     Current Outpatient Prescriptions  Medication Sig Dispense Refill  . acetaminophen (TYLENOL) 325 MG tablet Take 650 mg by mouth every 6 (six) hours as needed for mild pain.    Marland Kitchen atorvastatin (LIPITOR) 80 MG tablet Take 1 tablet (80 mg total) by mouth every evening. 30 tablet 3  . Dexlansoprazole (DEXILANT) 30 MG capsule Take 1 capsule (30 mg total) by mouth daily. 90 capsule 3  . furosemide (LASIX) 40 MG tablet Take 1 tablet (40 mg total) by mouth daily. 30 tablet 3  . glipiZIDE (GLUCOTROL) 5 MG tablet Take 1 tablet (5 mg total) by mouth 2 (two) times daily before a meal. 60 tablet 0  . hydrALAZINE (APRESOLINE) 50 MG tablet Take 1.5 tablets (75 mg total) by mouth 3 (three) times daily. 135 tablet 3  . lisinopril (PRINIVIL,ZESTRIL) 40 MG tablet Take 1 tablet (40 mg total) by mouth daily. 30 tablet 3  . metoprolol (LOPRESSOR) 100 MG tablet Take 1 tablet (100 mg total) by mouth 2 (two) times daily. 60 tablet 3  . nitroGLYCERIN (NITROSTAT) 0.4 MG SL tablet Place 1 tablet (0.4 mg total) under the tongue every 5 (five) minutes as needed for chest pain (up to 3 doses). 25 tablet 3  . rivaroxaban (XARELTO) 20 MG TABS tablet Take 1 tablet (20 mg total) by mouth daily with supper. 30 tablet 3  . spironolactone (ALDACTONE) 25 MG tablet Take 1 tablet (25  mg total) by mouth daily. 30 tablet 3   No current facility-administered medications for this visit.    Allergies:   Review of patient's allergies indicates no known allergies.    Social History:  The patient  reports that he has been smoking.  He does not have any smokeless tobacco history on file. He reports that he does not drink alcohol or use illicit drugs.   Family History:  The patient's family history includes Cancer in his mother and sister; Diabetes in his brother, father, mother, and sister; Heart attack in his brother, father, and mother; Heart disease in his brother, father, and mother; Hypertension in his brother, father, mother, and sister; Stroke in his brother.    ROS:   Please see the history of present illness.   Review of Systems  Constitution: Positive for diaphoresis.  Cardiovascular: Positive for dyspnea on exertion and irregular heartbeat.      PHYSICAL EXAM: VS:  BP 122/80 mmHg  Pulse 81  Ht 5\' 10"  (1.778 m)  Wt 186 lb (84.369 kg)  BMI 26.69 kg/m2    Wt Readings from Last 3 Encounters:  03/04/15 186 lb (84.369 kg)  02/25/15 181 lb 11.2 oz (82.419 kg)  12/19/14 184 lb (83.462 kg)     GEN: Well nourished, well developed, in no acute distress HEENT: normal Neck: no JVD, no masses Cardiac:  Normal S1/S2, RRR; no murmur ,  no rubs or gallops, no edema right wrist without hematoma or mass  Respiratory:  clear to auscultation bilaterally, no wheezing, rhonchi or rales. GI: soft, nontender, nondistended, + BS MS: no deformity or atrophy Skin: warm and dry  Neuro:  CNs II-XII intact, Strength and sensation are intact Psych: Normal affect   EKG:  EKG is ordered today.  It demonstrates:   NSR, HR 81, normal axis, inferior and anterolateral T-wave inversions   Recent Labs: 12/19/2014: ALT 15 02/20/2015: Magnesium 2.0; TSH 1.538 02/25/2015: BUN 15; Creatinine 1.17; Hemoglobin 16.7; Platelets 160; Potassium 4.3; Sodium 136    Lipid Panel    Component  Value Date/Time   CHOL 170 02/23/2015 0525   TRIG 78 02/23/2015 0525   HDL 32* 02/23/2015 0525   CHOLHDL 5.3 02/23/2015 0525   VLDL 16 02/23/2015 0525   LDLCALC 122* 02/23/2015 0525    ASSESSMENT AND PLAN:  Syncope, unspecified syncope type This was in the setting of rapid atrial fibrillation/flutter with heart rates over 200. He has an ischemic cardiomyopathy but his ejection fraction is greater than 35%. For now, I have asked him to not drive. I will review this further with Dr. 04/25/2015.    Atrial fibrillation and flutter Maintaining NSR. He is tolerating Xarelto. We discussed the importance of anticoagulation. Plan follow-up BMET and CBC in 6-8 weeks.  Cardiomyopathy, ischemic As noted, his ejection fraction is 46% by MRI. Continue hydralazine, beta blocker, ACE inhibitor, spironolactone.  Chronic systolic CHF He is NYHA 2. Continue current dose of Lasix. Check follow-up basic metabolic panel today.  Coronary artery disease involving native coronary artery of native heart without angina pectoris As noted, he has a chronic total occlusion of the LAD. MRI demonstrated delayed enhancement pattern that is unlikely to demonstrate significant improvement with revascularization. He has residual distal and small vessel disease. Medical therapy has been recommended. He is not having any symptoms of angina. He is not on aspirin as he is on Xarelto. Continue statin, beta blocker.  Mitral regurgitation Moderate by recent echocardiogram. He is stable clinically. This can be followed with serial echocardiograms over time.  Essential hypertension Controlled.  Hyperlipidemia Continue statin. Arrange follow-up lipids and LFTs in 6-8 weeks.  Tobacco use  Has quit smoking.  Current medicines are reviewed at length with the patient today.  Concerns regarding medicines are as outlined above.  The following changes have been made:    None   Labs/ tests ordered today include:  Orders  Placed This Encounter  Procedures  . Basic Metabolic Panel (BMET)  . CBC w/Diff  . Hepatic function panel  . Lipid Profile  . Basic Metabolic Panel (BMET)  . EKG 12-Lead    Disposition:   FU with Dr. [name] Croitoru 6-8 weeks.  No driving for now.    Signed, Rachelle Hora, MHS 03/04/2015 9:27 AM    [facility] [address] [place], [place], [place]  [place] Phone: [phone]; Fax: [phone]

## 2015-03-04 ENCOUNTER — Encounter: Payer: Self-pay | Admitting: Physician Assistant

## 2015-03-04 ENCOUNTER — Ambulatory Visit (INDEPENDENT_AMBULATORY_CARE_PROVIDER_SITE_OTHER): Payer: No Typology Code available for payment source | Admitting: Physician Assistant

## 2015-03-04 VITALS — BP 122/80 | HR 81 | Ht 70.0 in | Wt 186.0 lb

## 2015-03-04 DIAGNOSIS — I10 Essential (primary) hypertension: Secondary | ICD-10-CM

## 2015-03-04 DIAGNOSIS — I5022 Chronic systolic (congestive) heart failure: Secondary | ICD-10-CM

## 2015-03-04 DIAGNOSIS — I4891 Unspecified atrial fibrillation: Secondary | ICD-10-CM

## 2015-03-04 DIAGNOSIS — I340 Nonrheumatic mitral (valve) insufficiency: Secondary | ICD-10-CM

## 2015-03-04 DIAGNOSIS — I255 Ischemic cardiomyopathy: Secondary | ICD-10-CM

## 2015-03-04 DIAGNOSIS — E785 Hyperlipidemia, unspecified: Secondary | ICD-10-CM

## 2015-03-04 DIAGNOSIS — I4892 Unspecified atrial flutter: Secondary | ICD-10-CM

## 2015-03-04 DIAGNOSIS — R55 Syncope and collapse: Secondary | ICD-10-CM

## 2015-03-04 DIAGNOSIS — Z720 Tobacco use: Secondary | ICD-10-CM

## 2015-03-04 DIAGNOSIS — I2510 Atherosclerotic heart disease of native coronary artery without angina pectoris: Secondary | ICD-10-CM

## 2015-03-04 LAB — BASIC METABOLIC PANEL
BUN: 25 mg/dL — AB (ref 6–23)
CHLORIDE: 106 meq/L (ref 96–112)
CO2: 27 mEq/L (ref 19–32)
Calcium: 10.2 mg/dL (ref 8.4–10.5)
Creatinine, Ser: 1.12 mg/dL (ref 0.40–1.50)
GFR: 88.6 mL/min (ref >60.00)
Glucose, Bld: 161 mg/dL — ABNORMAL HIGH (ref 70–99)
Potassium: 4.1 mEq/L (ref 3.5–5.1)
SODIUM: 139 meq/L (ref 135–145)

## 2015-03-04 MED FILL — Lidocaine HCl Local Preservative Free (PF) Inj 1%: INTRAMUSCULAR | Qty: 30 | Status: AC

## 2015-03-04 MED FILL — Heparin Sodium (Porcine) 2 Unit/ML in Sodium Chloride 0.9%: INTRAMUSCULAR | Qty: 1500 | Status: AC

## 2015-03-04 NOTE — Patient Instructions (Signed)
Medication Instructions:  Your physician recommends that you continue on your current medications as directed. Please refer to the Current Medication list given to you today.   Labwork: 1. TODAY BMET 2. YOU WILL NEED TO HAVE FASTING LIPID AND LIVER PANEL, BMET, CBC DONE IN 6-8 WEEKS; I HAVE PLACED THESE ORDERS TO BE DONE AT [facility] AT DR. Royann Shivers OFFICE  Testing/Procedures: NONE  Follow-Up: YOU WILL NEED A  FOLLOW UP WITH DR. Royann Shivers IN 6-8 WEEKS  Any Other Special Instructions Will Be Listed Below (If Applicable). CARDIAC REHAB AT [facility]

## 2015-03-05 ENCOUNTER — Telehealth: Payer: Self-pay | Admitting: *Deleted

## 2015-03-05 DIAGNOSIS — I2510 Atherosclerotic heart disease of native coronary artery without angina pectoris: Secondary | ICD-10-CM

## 2015-03-05 DIAGNOSIS — R55 Syncope and collapse: Secondary | ICD-10-CM

## 2015-03-05 NOTE — Telephone Encounter (Signed)
lmptcb to go over lab results 

## 2015-03-05 NOTE — Telephone Encounter (Signed)
Pt notified of lab results by phone with verbal understanding to results given today. 

## 2015-03-05 NOTE — Telephone Encounter (Signed)
Order placed for Cardiac Rehab at [facility].

## 2015-03-05 NOTE — Telephone Encounter (Signed)
-----   Message from [name], MD sent at 03/05/2015  4:02 PM EDT ----- Yes, please ----- Message -----    From: Vita Barley, CMA    Sent: 03/05/2015   1:39 PM      To: [name], MD  [facility] office sent me a message and asked me to enter an order for cardiac rehab for Mr. Soave.  I don't see an order in his D/C summary or Scott's office note for rehab.  Do you want me to order this?  Thanks, Britta Mccreedy

## 2015-03-05 NOTE — Telephone Encounter (Signed)
Follow Up   Pt calling regarding test results. Please call back.

## 2015-03-06 ENCOUNTER — Encounter: Payer: Self-pay | Admitting: Internal Medicine

## 2015-03-06 ENCOUNTER — Ambulatory Visit: Payer: No Typology Code available for payment source | Attending: Internal Medicine | Admitting: Internal Medicine

## 2015-03-06 VITALS — BP 130/80 | HR 80 | Temp 98.0°F | Resp 16 | Wt 188.0 lb

## 2015-03-06 DIAGNOSIS — K219 Gastro-esophageal reflux disease without esophagitis: Secondary | ICD-10-CM

## 2015-03-06 DIAGNOSIS — F17200 Nicotine dependence, unspecified, uncomplicated: Secondary | ICD-10-CM | POA: Insufficient documentation

## 2015-03-06 DIAGNOSIS — E119 Type 2 diabetes mellitus without complications: Secondary | ICD-10-CM | POA: Insufficient documentation

## 2015-03-06 DIAGNOSIS — I119 Hypertensive heart disease without heart failure: Secondary | ICD-10-CM | POA: Insufficient documentation

## 2015-03-06 DIAGNOSIS — E785 Hyperlipidemia, unspecified: Secondary | ICD-10-CM

## 2015-03-06 DIAGNOSIS — I10 Essential (primary) hypertension: Secondary | ICD-10-CM

## 2015-03-06 DIAGNOSIS — Z79899 Other long term (current) drug therapy: Secondary | ICD-10-CM | POA: Insufficient documentation

## 2015-03-06 DIAGNOSIS — I2510 Atherosclerotic heart disease of native coronary artery without angina pectoris: Secondary | ICD-10-CM

## 2015-03-06 DIAGNOSIS — E139 Other specified diabetes mellitus without complications: Secondary | ICD-10-CM

## 2015-03-06 DIAGNOSIS — Z7901 Long term (current) use of anticoagulants: Secondary | ICD-10-CM | POA: Insufficient documentation

## 2015-03-06 DIAGNOSIS — I4892 Unspecified atrial flutter: Secondary | ICD-10-CM

## 2015-03-06 DIAGNOSIS — I340 Nonrheumatic mitral (valve) insufficiency: Secondary | ICD-10-CM | POA: Insufficient documentation

## 2015-03-06 DIAGNOSIS — Z951 Presence of aortocoronary bypass graft: Secondary | ICD-10-CM | POA: Insufficient documentation

## 2015-03-06 DIAGNOSIS — I255 Ischemic cardiomyopathy: Secondary | ICD-10-CM | POA: Insufficient documentation

## 2015-03-06 DIAGNOSIS — I4891 Unspecified atrial fibrillation: Secondary | ICD-10-CM

## 2015-03-06 LAB — GLUCOSE, POCT (MANUAL RESULT ENTRY): POC Glucose: 202 mg/dl — AB (ref 70–99)

## 2015-03-06 LAB — POCT GLYCOSYLATED HEMOGLOBIN (HGB A1C): HEMOGLOBIN A1C: 8.4

## 2015-03-06 MED ORDER — GLIPIZIDE 5 MG PO TABS: 5.0000 mg | ORAL_TABLET | Freq: Two times a day (BID) | ORAL | Status: DC

## 2015-03-06 MED ORDER — LISINOPRIL 40 MG PO TABS: 40.0000 mg | ORAL_TABLET | Freq: Every day | ORAL | Status: DC

## 2015-03-06 NOTE — Progress Notes (Signed)
MRN: [id number] Name: Sean Hinton  Sex: male Age: 54 y.o. DOB: 1961/05/01  Allergies: Review of patient's allergies indicates no known allergies.  Chief Complaint  Patient presents with  . Hospitalization Follow-up    HPI: Patient is 54 y.o. male who has history of hypertension, diabetes, tobacco abuse, recently hospitalized with syncope was found to have A. fib/flutter with RVR was treated with adenosine and Cardizem, he was converted no sinus rhythm, echo showed EF of 40-45% with multiple wall motion abnormalities, patient underwent a cardiac cath found to have 2 vessel CAD, he was also placed on Xarelto for A. fib, after discharge patient has followed up with her cardiologist his medications were optimized. Patient recently also had a blood chemistry done which was reviewed with the patient. Patient currently denies any acute symptoms has been compliant with his medications. His manual blood pressure is 130/80, also he is taking his Glucotrol 2 times a day denies any hypoglycemic symptoms.  Past Medical History  Diagnosis Date  . Diabetes mellitus with complication   . Hypertension   . Syncope     a. 02/2015 - felt 2/2 rapid AF/AFL.  Marland Kitchen Atrial fibrillation and flutter     a. Dx 02/2015 - placed on Xarelto.  . Ischemic cardiomyopathy   . CAD (coronary artery disease)     a. Dx 02/2015 - 2V CAD with CTO LAD, diffuse dz in PDA, OM - med rx.  . Hypertensive heart disease   . Hyperlipidemia   . Mitral regurgitation     a. Mod by echo 02/2015,  . Tobacco abuse     Past Surgical History  Procedure Laterality Date  . Right thumb surgery     . Cardiac catheterization N/A 02/24/2015    Procedure: Left Heart Cath and Coronary Angiography;  Surgeon: Marykay Lex, MD;  Location: [facility] INVASIVE CV LAB CUPID;  Service: Cardiovascular;  Laterality: N/A;  . Cardiac catheterization  02/24/2015    Procedure: Coronary/Bypass Graft CTO Intervention;  Surgeon: Marykay Lex, MD;  Location: [facility]  INVASIVE CV LAB CUPID;  Service: Cardiovascular;;      Medication List       This list is accurate as of: 03/06/15 11:55 AM.  Always use your most recent med list.               acetaminophen 325 MG tablet  Commonly known as:  TYLENOL  Take 650 mg by mouth every 6 (six) hours as needed for mild pain.     atorvastatin 80 MG tablet  Commonly known as:  LIPITOR  Take 1 tablet (80 mg total) by mouth every evening.     Dexlansoprazole 30 MG capsule  Commonly known as:  DEXILANT  Take 1 capsule (30 mg total) by mouth daily.     furosemide 40 MG tablet  Commonly known as:  LASIX  Take 1 tablet (40 mg total) by mouth daily.     glipiZIDE 5 MG tablet  Commonly known as:  GLUCOTROL  Take 1 tablet (5 mg total) by mouth 2 (two) times daily before a meal.     hydrALAZINE 50 MG tablet  Commonly known as:  APRESOLINE  Take 1.5 tablets (75 mg total) by mouth 3 (three) times daily.     lisinopril 40 MG tablet  Commonly known as:  PRINIVIL,ZESTRIL  Take 1 tablet (40 mg total) by mouth daily.     metoprolol 100 MG tablet  Commonly known as:  LOPRESSOR  Take 1 tablet (  100 mg total) by mouth 2 (two) times daily.     nitroGLYCERIN 0.4 MG SL tablet  Commonly known as:  NITROSTAT  Place 1 tablet (0.4 mg total) under the tongue every 5 (five) minutes as needed for chest pain (up to 3 doses).     rivaroxaban 20 MG Tabs tablet  Commonly known as:  XARELTO  Take 1 tablet (20 mg total) by mouth daily with supper.     spironolactone 25 MG tablet  Commonly known as:  ALDACTONE  Take 1 tablet (25 mg total) by mouth daily.        Meds ordered this encounter  Medications  . glipiZIDE (GLUCOTROL) 5 MG tablet    Sig: Take 1 tablet (5 mg total) by mouth 2 (two) times daily before a meal.    Dispense:  60 tablet    Refill:  3  . lisinopril (PRINIVIL,ZESTRIL) 40 MG tablet    Sig: Take 1 tablet (40 mg total) by mouth daily.    Dispense:  30 tablet    Refill:  3    Immunization History    Administered Date(s) Administered  . Pneumococcal Polysaccharide-23 11/19/2014    Family History  Problem Relation Age of Onset  . Diabetes Mother   . Hypertension Mother   . Heart disease Mother   . Cancer Mother     stomach cancer   . Diabetes Father   . Hypertension Father   . Heart disease Father   . Diabetes Sister   . Hypertension Sister   . Cancer Sister     breast cancer   . Diabetes Brother   . Hypertension Brother   . Heart disease Brother   . Stroke Brother   . Heart attack Mother   . Heart attack Father   . Heart attack Brother     History  Substance Use Topics  . Smoking status: Current Every Day Smoker -- 0.50 packs/day for 30 years  . Smokeless tobacco: Not on file  . Alcohol Use: No    Review of Systems   As noted in HPI  Filed Vitals:   03/06/15 1002  BP: 130/80  Pulse:   Temp:   Resp:     Physical Exam  Physical Exam  Constitutional: No distress.  Eyes: EOM are normal. Pupils are equal, round, and reactive to light.  Cardiovascular: Normal rate and regular rhythm.   Pulmonary/Chest: Breath sounds normal. No respiratory distress. He has no wheezes. He has no rales.  Musculoskeletal: He exhibits no edema.    CBC    Component Value Date/Time   WBC 8.2 02/25/2015 0434   RBC 5.67 02/25/2015 0434   HGB 16.7 02/25/2015 0434   HCT 47.2 02/25/2015 0434   PLT 160 02/25/2015 0434   MCV 83.2 02/25/2015 0434   LYMPHSABS 2.8 02/20/2015 1408   MONOABS 0.7 02/20/2015 1408   EOSABS 0.1 02/20/2015 1408   BASOSABS 0.0 02/20/2015 1408    CMP     Component Value Date/Time   NA 139 03/04/2015 1025   K 4.1 03/04/2015 1025   CL 106 03/04/2015 1025   CO2 27 03/04/2015 1025   GLUCOSE 161* 03/04/2015 1025   BUN 25* 03/04/2015 1025   CREATININE 1.12 03/04/2015 1025   CALCIUM 10.2 03/04/2015 1025   PROT 7.8 12/19/2014 1455   ALBUMIN 4.1 12/19/2014 1455   AST 20 12/19/2014 1455   ALT 15 12/19/2014 1455   ALKPHOS 97 12/19/2014 1455    BILITOT 0.9 12/19/2014 1455  GFRNONAA >60 02/25/2015 0846   GFRAA >60 02/25/2015 0846    Lab Results  Component Value Date/Time   CHOL 170 02/23/2015 05:25 AM    Lab Results  Component Value Date/Time   HGBA1C 8.40 03/06/2015 09:33 AM    Lab Results  Component Value Date/Time   AST 20 12/19/2014 02:55 PM    Assessment and Plan  Other specified diabetes mellitus without complications - Plan:  Results for orders placed or performed in visit on 03/06/15  Glucose (CBG)  Result Value Ref Range   POC Glucose 202 (A) 70 - 99 mg/dl  HgB W6F  Result Value Ref Range   Hemoglobin A1C 8.40    Advised patient for diabetes meal planning, recently his Glucotrol dose has been increased, will repeat A1c in 3 months.HgB A1c, glipiZIDE (GLUCOTROL) 5 MG tablet  Gastroesophageal reflux disease, esophagitis presence not specified Last modification, continue with Dexilant  Essential hypertension - Plan:Blood pressure is controlled continue with current meds  lisinopril (PRINIVIL,ZESTRIL) 40 MG tablet  Hyperlipidemia Recently been started on Lipitor 80 mg, recheck fasting lipid panel on the next visit  CAD in native artery/ Atrial fibrillation and flutter  Currently on Xarelto, statins, Lasix, ACE inhibitor, beta blocker, Aldactone, following up with cardiology   Return in about 3 months (around 06/06/2015), or if symptoms worsen or fail to improve.   This note has been created with Education officer, environmental. Any transcriptional errors are unintentional.    Doris Cheadle, MD

## 2015-03-06 NOTE — Patient Instructions (Signed)
DASH Eating Plan DASH stands for "Dietary Approaches to Stop Hypertension." The DASH eating plan is a healthy eating plan that has been shown to reduce high blood pressure (hypertension). Additional health benefits may include reducing the risk of type 2 diabetes mellitus, heart disease, and stroke. The DASH eating plan may also help with weight loss. WHAT DO I NEED TO KNOW ABOUT THE DASH EATING PLAN? For the DASH eating plan, you will follow these general guidelines:  Choose foods with a percent daily value for sodium of less than 5% (as listed on the food label).  Use salt-free seasonings or herbs instead of table salt or sea salt.  Check with your health care provider or pharmacist before using salt substitutes.  Eat lower-sodium products, often labeled as "lower sodium" or "no salt added."  Eat fresh foods.  Eat more vegetables, fruits, and low-fat dairy products.  Choose whole grains. Look for the word "whole" as the first word in the ingredient list.  Choose fish and skinless chicken or turkey more often than red meat. Limit fish, poultry, and meat to 6 oz (170 g) each day.  Limit sweets, desserts, sugars, and sugary drinks.  Choose heart-healthy fats.  Limit cheese to 1 oz (28 g) per day.  Eat more home-cooked food and less restaurant, buffet, and fast food.  Limit fried foods.  Cook foods using methods other than frying.  Limit canned vegetables. If you do use them, rinse them well to decrease the sodium.  When eating at a restaurant, ask that your food be prepared with less salt, or no salt if possible. WHAT FOODS CAN I EAT? Seek help from a dietitian for individual calorie needs. Grains Whole grain or whole wheat bread. Brown rice. Whole grain or whole wheat pasta. Quinoa, bulgur, and whole grain cereals. Low-sodium cereals. Corn or whole wheat flour tortillas. Whole grain cornbread. Whole grain crackers. Low-sodium crackers. Vegetables Fresh or frozen vegetables  (raw, steamed, roasted, or grilled). Low-sodium or reduced-sodium tomato and vegetable juices. Low-sodium or reduced-sodium tomato sauce and paste. Low-sodium or reduced-sodium canned vegetables.  Fruits All fresh, canned (in natural juice), or frozen fruits. Meat and Other Protein Products Ground beef (85% or leaner), grass-fed beef, or beef trimmed of fat. Skinless chicken or turkey. Ground chicken or turkey. Pork trimmed of fat. All fish and seafood. Eggs. Dried beans, peas, or lentils. Unsalted nuts and seeds. Unsalted canned beans. Dairy Low-fat dairy products, such as skim or 1% milk, 2% or reduced-fat cheeses, low-fat ricotta or cottage cheese, or plain low-fat yogurt. Low-sodium or reduced-sodium cheeses. Fats and Oils Tub margarines without trans fats. Light or reduced-fat mayonnaise and salad dressings (reduced sodium). Avocado. Safflower, olive, or canola oils. Natural peanut or almond butter. Other Unsalted popcorn and pretzels. The items listed above may not be a complete list of recommended foods or beverages. Contact your dietitian for more options. WHAT FOODS ARE NOT RECOMMENDED? Grains White bread. White pasta. White rice. Refined cornbread. Bagels and croissants. Crackers that contain trans fat. Vegetables Creamed or fried vegetables. Vegetables in a cheese sauce. Regular canned vegetables. Regular canned tomato sauce and paste. Regular tomato and vegetable juices. Fruits Dried fruits. Canned fruit in light or heavy syrup. Fruit juice. Meat and Other Protein Products Fatty cuts of meat. Ribs, chicken wings, bacon, sausage, bologna, salami, chitterlings, fatback, hot dogs, bratwurst, and packaged luncheon meats. Salted nuts and seeds. Canned beans with salt. Dairy Whole or 2% milk, cream, half-and-half, and cream cheese. Whole-fat or sweetened yogurt. Full-fat   cheeses or blue cheese. Nondairy creamers and whipped toppings. Processed cheese, cheese spreads, or cheese  curds. Condiments Onion and garlic salt, seasoned salt, table salt, and sea salt. Canned and packaged gravies. Worcestershire sauce. Tartar sauce. Barbecue sauce. Teriyaki sauce. Soy sauce, including reduced sodium. Steak sauce. Fish sauce. Oyster sauce. Cocktail sauce. Horseradish. Ketchup and mustard. Meat flavorings and tenderizers. Bouillon cubes. Hot sauce. Tabasco sauce. Marinades. Taco seasonings. Relishes. Fats and Oils Butter, stick margarine, lard, shortening, ghee, and bacon fat. Coconut, palm kernel, or palm oils. Regular salad dressings. Other Pickles and olives. Salted popcorn and pretzels. The items listed above may not be a complete list of foods and beverages to avoid. Contact your dietitian for more information. WHERE CAN I FIND MORE INFORMATION? National Heart, Lung, and [employer] Institute: www.nhlbi.nih.gov/health/health-topics/topics/dash/ Document Released: 09/30/2011 Document Revised: 02/25/2014 Document Reviewed: 08/15/2013 ExitCare Patient Information 2015 [employer]. This information is not intended to replace advice given to you by your health care provider. Make sure you discuss any questions you have with your health care provider. Diabetes Mellitus and Food It is important for you to manage your blood sugar (glucose) level. Your blood glucose level can be greatly affected by what you eat. Eating healthier foods in the appropriate amounts throughout the day at about the same time each day will help you control your blood glucose level. It can also help slow or prevent worsening of your diabetes mellitus. Healthy eating may even help you improve the level of your blood pressure and reach or maintain a healthy weight.  HOW CAN FOOD AFFECT ME? Carbohydrates Carbohydrates affect your blood glucose level more than any other type of food. Your dietitian will help you determine how many carbohydrates to eat at each meal and teach you how to count carbohydrates. Counting  carbohydrates is important to keep your blood glucose at a healthy level, especially if you are using insulin or taking certain medicines for diabetes mellitus. Alcohol Alcohol can cause sudden decreases in blood glucose (hypoglycemia), especially if you use insulin or take certain medicines for diabetes mellitus. Hypoglycemia can be a life-threatening condition. Symptoms of hypoglycemia (sleepiness, dizziness, and disorientation) are similar to symptoms of having too much alcohol.  If your health care provider has given you approval to drink alcohol, do so in moderation and use the following guidelines:  Women should not have more than one drink per day, and men should not have more than two drinks per day. One drink is equal to:  12 oz of beer.  5 oz of wine.  1 oz of hard liquor.  Do not drink on an empty stomach.  Keep yourself hydrated. Have water, diet soda, or unsweetened iced tea.  Regular soda, juice, and other mixers might contain a lot of carbohydrates and should be counted. WHAT FOODS ARE NOT RECOMMENDED? As you make food choices, it is important to remember that all foods are not the same. Some foods have fewer nutrients per serving than other foods, even though they might have the same number of calories or carbohydrates. It is difficult to get your body what it needs when you eat foods with fewer nutrients. Examples of foods that you should avoid that are high in calories and carbohydrates but low in nutrients include:  Trans fats (most processed foods list trans fats on the Nutrition Facts label).  Regular soda.  Juice.  Candy.  Sweets, such as cake, pie, doughnuts, and cookies.  Fried foods. WHAT FOODS CAN I EAT? Have nutrient-rich foods,   which will nourish your body and keep you healthy. The food you should eat also will depend on several factors, including:  The calories you need.  The medicines you take.  Your weight.  Your blood glucose level.  Your  blood pressure level.  Your cholesterol level. You also should eat a variety of foods, including:  Protein, such as meat, poultry, fish, tofu, nuts, and seeds (lean animal proteins are best).  Fruits.  Vegetables.  Dairy products, such as milk, cheese, and yogurt (low fat is best).  Breads, grains, pasta, cereal, rice, and beans.  Fats such as olive oil, trans fat-free margarine, canola oil, avocado, and olives. DOES EVERYONE WITH DIABETES MELLITUS HAVE THE SAME MEAL PLAN? Because every person with diabetes mellitus is different, there is not one meal plan that works for everyone. It is very important that you meet with a dietitian who will help you create a meal plan that is just right for you. Document Released: 07/08/2005 Document Revised: 10/16/2013 Document Reviewed: 09/07/2013 ExitCare Patient Information 2015 [employer]. This information is not intended to replace advice given to you by your health care provider. Make sure you discuss any questions you have with your health care provider.  

## 2015-03-06 NOTE — Progress Notes (Signed)
Patient here for follow up from hospital Patient was seen for atrial fib and flutter Patient states they were unable to do cardiac cath due to his arteries being   Too thick Patient is also here for follow up on his diabetes and hypertension and requesting A referral to dentist and eye doctor

## 2015-03-17 NOTE — Telephone Encounter (Signed)
Lvm with woman who answered the phone that if Mr. Michl could return my call to discuss recommendations from Dr. Royann Shivers and Bing Neighbors. PA.

## 2015-03-18 ENCOUNTER — Telehealth: Payer: Self-pay | Admitting: Physician Assistant

## 2015-03-18 NOTE — Telephone Encounter (Signed)
Follow Up ° ° ° ° ° ° °Pt returning Carol's phone call. °

## 2015-03-18 NOTE — Telephone Encounter (Signed)
Pt advised today per Dr. Royann Shivers and Bing Neighbors. PA  no driving x 6 months from the last syncope episode 02/20/15. Pt verbalized understanding to this plan of care.

## 2015-04-15 ENCOUNTER — Telehealth: Payer: Self-pay | Admitting: *Deleted

## 2015-04-15 NOTE — Telephone Encounter (Signed)
Signed order for cardiac rehab faxed to [facility]. 

## 2015-04-16 ENCOUNTER — Encounter: Payer: Self-pay | Admitting: Cardiovascular Disease

## 2015-04-16 ENCOUNTER — Ambulatory Visit (INDEPENDENT_AMBULATORY_CARE_PROVIDER_SITE_OTHER): Payer: No Typology Code available for payment source | Admitting: Cardiovascular Disease

## 2015-04-16 VITALS — BP 126/86 | HR 75 | Resp 16 | Ht 70.0 in | Wt 190.0 lb

## 2015-04-16 DIAGNOSIS — I5189 Other ill-defined heart diseases: Secondary | ICD-10-CM

## 2015-04-16 DIAGNOSIS — I2510 Atherosclerotic heart disease of native coronary artery without angina pectoris: Secondary | ICD-10-CM

## 2015-04-16 DIAGNOSIS — I255 Ischemic cardiomyopathy: Secondary | ICD-10-CM

## 2015-04-16 DIAGNOSIS — I519 Heart disease, unspecified: Secondary | ICD-10-CM

## 2015-04-16 DIAGNOSIS — Z79899 Other long term (current) drug therapy: Secondary | ICD-10-CM

## 2015-04-16 DIAGNOSIS — I4892 Unspecified atrial flutter: Secondary | ICD-10-CM

## 2015-04-16 DIAGNOSIS — R55 Syncope and collapse: Secondary | ICD-10-CM

## 2015-04-16 DIAGNOSIS — I10 Essential (primary) hypertension: Secondary | ICD-10-CM

## 2015-04-16 DIAGNOSIS — Z720 Tobacco use: Secondary | ICD-10-CM

## 2015-04-16 DIAGNOSIS — E785 Hyperlipidemia, unspecified: Secondary | ICD-10-CM

## 2015-04-16 NOTE — Patient Instructions (Signed)
Medication Instructions:   No changes  Labwork:  Fasting at [facility] last week of August 2016  Testing/Procedures:  Your physician has requested that you have an echocardiogram last week of August 2016. Echocardiography is a painless test that uses sound waves to create images of your heart. It provides your doctor with information about the size and shape of your heart and how well your heart's chambers and valves are working. This procedure takes approximately one hour. There are no restrictions for this procedure.    Follow-Up:  6 months  Any Other Special Instructions Will Be Listed Below (If Applicable).  Your physician discussed the hazards of tobacco use. Tobacco use cessation is recommended and techniques and options to help you quit were discussed.

## 2015-04-18 NOTE — Progress Notes (Signed)
Patient ID: Sean Hinton, male   DOB: Apr 18, 1961, 54 y.o.   MRN: [id number]     Cardiology Office Note   Date:  04/18/2015   ID:  Sean Hinton, DOB 24-May-1961, MRN [id number]  PCP:  Doris Cheadle, MD  Cardiologist:   [name], MD   Chief Complaint  Patient presents with  . Follow-up    Patient has no complaints.      History of Present Illness: Sean Hinton is a 54 y.o. male who presents for  Follow-up after recently diagnosed coronary artery disease and ischemic cardiomyopathy that presented as syncope in the setting of atrial flutter with rapid ventricular response. He underwent coronary angiography that showed two-vessel disease with what appear to be a chronic total occlusion of the mid LAD artery as well as diffuse disease in the posterior descending and oblique marginal arteries (ostial 80%, RPDA-1 50%, RPDA-2 70%). Attempt at PCI of LAD CTO failed and he is felt best treated with medical therapy.  Cardiac MRI showed a left ventricular ejection fraction of 46% and late gadolinium enhancement suggested that the territory downstream of the LAD artery occlusion was mostly scar , unlikely to benefit from revascularization. Retrospectively, he believes he had a myocardial infarction months before which he treated as "indigestion".  He also has moderate mitral insufficiency.  He had multiple risk factors that were not well treated at the time of presentation including severe hypertension,  Hypercholesterolemia, poorly controlled diabetes mellitus and ongoing tobacco abuse.   from a symptom point of view he is feeling great. He denies any issues with effort angina or dyspnea and has no limitations in physical activity. He has not experienced palpitations or syncope since hospital discharge. He reports compliance with his antihypertensives , diuretic and anticoagulant medication.  He is avoiding salt in his diet.  Glycemic control is improving. He has lost some weight,  But  subsequently gained pretty much all of it back. At home he records blood pressure that is usually normal with range 114 /68-150/90.  He is on high doses of beta blocker and ACE inhibitor and takes an aldosteronoma antagonist.   unfortunately he is still smoking up to 3 cigarettes a day.  Past Medical History  Diagnosis Date  . Diabetes mellitus with complication   . Hypertension   . Syncope     a. 02/2015 - felt 2/2 rapid AF/AFL.  Marland Kitchen Atrial fibrillation and flutter     a. Dx 02/2015 - placed on Xarelto.  . Ischemic cardiomyopathy   . CAD (coronary artery disease)     a. Dx 02/2015 - 2V CAD with CTO LAD, diffuse dz in PDA, OM - med rx.  . Hypertensive heart disease   . Hyperlipidemia   . Mitral regurgitation     a. Mod by echo 02/2015,  . Tobacco abuse     Past Surgical History  Procedure Laterality Date  . Right thumb surgery     . Cardiac catheterization N/A 02/24/2015    Procedure: Left Heart Cath and Coronary Angiography;  Surgeon: Marykay Lex, MD;  Location: [facility] INVASIVE CV LAB CUPID;  Service: Cardiovascular;  Laterality: N/A;  . Cardiac catheterization  02/24/2015    Procedure: Coronary/Bypass Graft CTO Intervention;  Surgeon: Marykay Lex, MD;  Location: [facility] INVASIVE CV LAB CUPID;  Service: Cardiovascular;;     Current Outpatient Prescriptions  Medication Sig Dispense Refill  . atorvastatin (LIPITOR) 80 MG tablet Take 1 tablet (80 mg total) by mouth every evening. 30 tablet 3  .  Dexlansoprazole (DEXILANT) 30 MG capsule Take 1 capsule (30 mg total) by mouth daily. 90 capsule 3  . furosemide (LASIX) 40 MG tablet Take 1 tablet (40 mg total) by mouth daily. 30 tablet 3  . glipiZIDE (GLUCOTROL) 5 MG tablet Take 1 tablet (5 mg total) by mouth 2 (two) times daily before a meal. 60 tablet 3  . hydrALAZINE (APRESOLINE) 50 MG tablet Take 1.5 tablets (75 mg total) by mouth 3 (three) times daily. 135 tablet 3  . lisinopril (PRINIVIL,ZESTRIL) 40 MG tablet Take 1 tablet (40 mg total) by  mouth daily. 30 tablet 3  . metoprolol (LOPRESSOR) 100 MG tablet Take 1 tablet (100 mg total) by mouth 2 (two) times daily. 60 tablet 3  . nitroGLYCERIN (NITROSTAT) 0.4 MG SL tablet Place 1 tablet (0.4 mg total) under the tongue every 5 (five) minutes as needed for chest pain (up to 3 doses). 25 tablet 3  . rivaroxaban (XARELTO) 20 MG TABS tablet Take 1 tablet (20 mg total) by mouth daily with supper. 30 tablet 3  . spironolactone (ALDACTONE) 25 MG tablet Take 1 tablet (25 mg total) by mouth daily. 30 tablet 3   No current facility-administered medications for this visit.    Allergies:   Review of patient's allergies indicates no known allergies.    Social History:  The patient  reports that he has been smoking.  He does not have any smokeless tobacco history on file. He reports that he does not drink alcohol or use illicit drugs.   Family History:  The patient's family history includes Cancer in his mother and sister; Diabetes in his brother, father, mother, and sister; Heart attack in his brother, father, and mother; Heart disease in his brother, father, and mother; Hypertension in his brother, father, mother, and sister; Stroke in his brother.    ROS:  Please see the history of present illness.    Otherwise, review of systems positive for none.   All other systems are reviewed and negative.    PHYSICAL EXAM: VS:  BP 126/86 mmHg  Pulse 75  Ht 5\' 10"  (1.778 m)  Wt 190 lb (86.183 kg)  BMI 27.26 kg/m2 , BMI Body mass index is 27.26 kg/(m^2).  General: Alert, oriented x3, no distress Head: no evidence of trauma, PERRL, EOMI, no exophtalmos or lid lag, no myxedema, no xanthelasma; normal ears, nose and oropharynx Neck: normal jugular venous pulsations and no hepatojugular reflux; brisk carotid pulses without delay and no carotid bruits Chest: clear to auscultation, no signs of consolidation by percussion or palpation, normal fremitus, symmetrical and full respiratory  excursions Cardiovascular: normal position and quality of the apical impulse, regular rhythm, normal first and second heart sounds, no murmurs, rubs or gallops Abdomen: no tenderness or distention, no masses by palpation, no abnormal pulsatility or arterial bruits, normal bowel sounds, no hepatosplenomegaly Extremities: no clubbing, cyanosis or edema; 2+ radial, ulnar and brachial pulses bilaterally; 2+ right femoral, posterior tibial and dorsalis pedis pulses; 2+ left femoral, posterior tibial and dorsalis pedis pulses; no subclavian or femoral bruits Neurological: grossly nonfocal Psych: euthymic mood, full affect   EKG:  EKG is ordered today. Recent Labs: 12/19/2014: ALT 15 02/20/2015: Magnesium 2.0; TSH 1.538 02/25/2015: Hemoglobin 16.7; Platelets 160 03/04/2015: BUN 25*; Creatinine, Ser 1.12; Potassium 4.1; Sodium 139    Lipid Panel    Component Value Date/Time   CHOL 170 02/23/2015 0525   TRIG 78 02/23/2015 0525   HDL 32* 02/23/2015 0525   CHOLHDL 5.3 02/23/2015 0525  VLDL 16 02/23/2015 0525   LDLCALC 122* 02/23/2015 0525      Wt Readings from Last 3 Encounters:  04/16/15 190 lb (86.183 kg)  03/06/15 188 lb (85.276 kg)  03/04/15 186 lb (84.369 kg)      ASSESSMENT AND PLAN:  1.  Chronic combined systolic and diastolic heart failure, euvolemic, functional class I on ACE inhibitor as an beta blockers and a relatively low dose of loop diuretic 2.  Mild ischemic cardiomyopathy with left ventricular ejection fraction of 46% by MRI, 40-45 percent by echocardiography with wall motion abnormalities due to scar in the distribution of the mid distal LAD artery 3.  Coronary artery disease with chronic total occlusion of the mid LAD artery and scattered diffuse disease in the PDA and oblique marginal branches, medical therapy. 4.  Atrial flutter with rapid ventricular response complicated by syncope, no recurrence.  Continue anticoagulation with Xarelto. Syncope was likely related to  extremely rapid ventricular rate at 240 bpm but there was no convincing evidence of preexcitation. He has not had clinical recurrence of arrhythmia and hopefully syncope was not recur now that he is on high doses of beta blocker. 5.  Hyperlipidemia on highly effective statin, will recheck lipids in a few weeks.  6.  Severe systemic hypertension well compensated , no changes to his medications today 7.  Diabetes mellitus type 2 on oral anti-diabetics with improving control -  Last hemoglobin A1c 8.4, recent  Glucose spot checks improved 8.  Moderate mitral insufficiency, likely secondary to cardiomyopathy reevaluate by echo 9.   Needs to permanently quit smoking.  Discussed in detail  Current medicines are reviewed at length with the patient today.  The patient does not have concerns regarding medicines.  The following changes have been made:  no change  Labs/ tests ordered today include:  Orders Placed This Encounter  Procedures  . Comprehensive metabolic panel  . Lipid panel  . ECHO LIMITED   Patient Instructions  Medication Instructions:   No changes  Labwork:  Fasting at [facility] last week of August 2016  Testing/Procedures:  Your physician has requested that you have an echocardiogram last week of August 2016. Echocardiography is a painless test that uses sound waves to create images of your heart. It provides your doctor with information about the size and shape of your heart and how well your heart's chambers and valves are working. This procedure takes approximately one hour. There are no restrictions for this procedure.    Follow-Up:  6 months  Any Other Special Instructions Will Be Listed Below (If Applicable).  Your physician discussed the hazards of tobacco use. Tobacco use cessation is recommended and techniques and options to help you quit were discussed.          Joie Bimler, MD  04/18/2015 8:43 AM    [name], MD, [facility]  [facility] [phone] office [phone] pager

## 2015-05-06 ENCOUNTER — Telehealth: Payer: Self-pay

## 2015-05-06 ENCOUNTER — Encounter (HOSPITAL_COMMUNITY)
Admission: RE | Admit: 2015-05-06 | Discharge: 2015-05-06 | Disposition: A | Discharge status: Home / Self Care | Payer: Self-pay | Source: Ambulatory Visit | Attending: Cardiovascular Disease | Admitting: Cardiovascular Disease

## 2015-05-06 DIAGNOSIS — I429 Cardiomyopathy, unspecified: Secondary | ICD-10-CM | POA: Insufficient documentation

## 2015-05-06 DIAGNOSIS — I10 Essential (primary) hypertension: Secondary | ICD-10-CM | POA: Insufficient documentation

## 2015-05-06 DIAGNOSIS — Z9861 Coronary angioplasty status: Secondary | ICD-10-CM | POA: Insufficient documentation

## 2015-05-06 DIAGNOSIS — R55 Syncope and collapse: Secondary | ICD-10-CM | POA: Insufficient documentation

## 2015-05-06 DIAGNOSIS — E785 Hyperlipidemia, unspecified: Secondary | ICD-10-CM | POA: Insufficient documentation

## 2015-05-06 DIAGNOSIS — I252 Old myocardial infarction: Secondary | ICD-10-CM | POA: Insufficient documentation

## 2015-05-06 DIAGNOSIS — Z48812 Encounter for surgical aftercare following surgery on the circulatory system: Secondary | ICD-10-CM | POA: Insufficient documentation

## 2015-05-06 NOTE — Telephone Encounter (Signed)
Patient called stating he is having a Gerd flair up and would like to be seen Patient states the dexilant does not seem to be helping Having some cramping as well Patient will need to be seen Call transferred to front desk

## 2015-05-08 ENCOUNTER — Encounter (HOSPITAL_COMMUNITY)
Admission: RE | Admit: 2015-05-08 | Discharge: 2015-05-08 | Disposition: A | Discharge status: Home / Self Care | Payer: Self-pay | Source: Ambulatory Visit | Attending: Cardiovascular Disease | Admitting: Cardiovascular Disease

## 2015-05-09 ENCOUNTER — Encounter (HOSPITAL_COMMUNITY): Payer: Self-pay

## 2015-05-13 ENCOUNTER — Encounter (HOSPITAL_COMMUNITY): Payer: Self-pay

## 2015-05-15 ENCOUNTER — Encounter (HOSPITAL_COMMUNITY): Payer: Self-pay

## 2015-05-16 ENCOUNTER — Encounter (HOSPITAL_COMMUNITY): Payer: Self-pay

## 2015-05-20 ENCOUNTER — Encounter (HOSPITAL_COMMUNITY): Payer: Self-pay

## 2015-05-22 ENCOUNTER — Encounter (HOSPITAL_COMMUNITY): Payer: Self-pay

## 2015-05-23 ENCOUNTER — Encounter (HOSPITAL_COMMUNITY): Payer: Self-pay

## 2015-05-27 ENCOUNTER — Encounter (HOSPITAL_COMMUNITY): Payer: Self-pay

## 2015-05-29 ENCOUNTER — Encounter (HOSPITAL_COMMUNITY): Payer: Self-pay

## 2015-05-30 ENCOUNTER — Encounter (HOSPITAL_COMMUNITY): Payer: Self-pay

## 2015-06-03 ENCOUNTER — Encounter (HOSPITAL_COMMUNITY): Payer: Self-pay

## 2015-06-05 ENCOUNTER — Encounter (HOSPITAL_COMMUNITY): Payer: Self-pay

## 2015-06-06 ENCOUNTER — Encounter (HOSPITAL_COMMUNITY): Payer: Self-pay

## 2015-06-10 ENCOUNTER — Encounter (HOSPITAL_COMMUNITY): Payer: Self-pay

## 2015-06-12 ENCOUNTER — Encounter (HOSPITAL_COMMUNITY): Payer: Self-pay

## 2015-06-13 ENCOUNTER — Encounter (HOSPITAL_COMMUNITY): Payer: Self-pay

## 2015-06-16 ENCOUNTER — Telehealth: Payer: Self-pay

## 2015-06-16 ENCOUNTER — Ambulatory Visit (HOSPITAL_COMMUNITY): Payer: No Typology Code available for payment source

## 2015-06-16 NOTE — Telephone Encounter (Signed)
Returned patient phone call Patient had stated he was already taken care of and appreciated The return call

## 2015-06-17 ENCOUNTER — Encounter (HOSPITAL_COMMUNITY): Payer: Self-pay

## 2015-06-19 ENCOUNTER — Encounter (HOSPITAL_COMMUNITY): Payer: Self-pay

## 2015-06-20 ENCOUNTER — Encounter (HOSPITAL_COMMUNITY): Payer: Self-pay

## 2015-06-24 ENCOUNTER — Encounter (HOSPITAL_COMMUNITY): Payer: Self-pay

## 2015-06-26 ENCOUNTER — Encounter (HOSPITAL_COMMUNITY): Payer: Self-pay

## 2015-06-27 ENCOUNTER — Encounter (HOSPITAL_COMMUNITY): Payer: Self-pay

## 2015-07-01 ENCOUNTER — Encounter (HOSPITAL_COMMUNITY): Payer: Self-pay

## 2015-07-02 ENCOUNTER — Other Ambulatory Visit (HOSPITAL_COMMUNITY): Payer: No Typology Code available for payment source

## 2015-07-03 ENCOUNTER — Encounter (HOSPITAL_COMMUNITY): Payer: Self-pay

## 2015-07-04 ENCOUNTER — Encounter (HOSPITAL_COMMUNITY): Payer: Self-pay

## 2015-07-08 ENCOUNTER — Encounter (HOSPITAL_COMMUNITY): Payer: Self-pay

## 2015-07-10 ENCOUNTER — Encounter (HOSPITAL_COMMUNITY): Payer: Self-pay

## 2015-07-11 ENCOUNTER — Encounter (HOSPITAL_COMMUNITY): Payer: Self-pay

## 2015-07-15 ENCOUNTER — Encounter (HOSPITAL_COMMUNITY): Payer: Self-pay

## 2015-07-17 ENCOUNTER — Encounter (HOSPITAL_COMMUNITY): Payer: Self-pay

## 2015-07-18 ENCOUNTER — Encounter (HOSPITAL_COMMUNITY): Payer: Self-pay

## 2015-07-22 ENCOUNTER — Encounter (HOSPITAL_COMMUNITY): Payer: Self-pay

## 2015-07-24 ENCOUNTER — Encounter (HOSPITAL_COMMUNITY): Payer: Self-pay

## 2015-07-25 ENCOUNTER — Encounter (HOSPITAL_COMMUNITY): Payer: Self-pay

## 2015-09-24 ENCOUNTER — Ambulatory Visit: Payer: Self-pay

## 2015-09-25 DIAGNOSIS — J189 Pneumonia, unspecified organism: Secondary | ICD-10-CM

## 2015-09-25 HISTORY — DX: Pneumonia, unspecified organism: J18.9

## 2015-10-13 ENCOUNTER — Inpatient Hospital Stay (HOSPITAL_COMMUNITY): Payer: MEDICAID

## 2015-10-13 ENCOUNTER — Inpatient Hospital Stay (HOSPITAL_COMMUNITY): Payer: Self-pay

## 2015-10-13 ENCOUNTER — Inpatient Hospital Stay (HOSPITAL_COMMUNITY)
Admission: EM | Admit: 2015-10-13 | Discharge: 2015-10-17 | DRG: 193 | Disposition: A | Discharge status: Home / Self Care | Payer: Self-pay | Attending: Internal Medicine | Admitting: Internal Medicine

## 2015-10-13 ENCOUNTER — Emergency Department (HOSPITAL_COMMUNITY): Payer: Self-pay

## 2015-10-13 ENCOUNTER — Encounter (HOSPITAL_COMMUNITY): Payer: Self-pay | Admitting: Emergency Medicine

## 2015-10-13 DIAGNOSIS — R079 Chest pain, unspecified: Secondary | ICD-10-CM

## 2015-10-13 DIAGNOSIS — E785 Hyperlipidemia, unspecified: Secondary | ICD-10-CM

## 2015-10-13 DIAGNOSIS — I110 Hypertensive heart disease with heart failure: Secondary | ICD-10-CM | POA: Diagnosis present

## 2015-10-13 DIAGNOSIS — I255 Ischemic cardiomyopathy: Secondary | ICD-10-CM

## 2015-10-13 DIAGNOSIS — E119 Type 2 diabetes mellitus without complications: Secondary | ICD-10-CM

## 2015-10-13 DIAGNOSIS — R7989 Other specified abnormal findings of blood chemistry: Secondary | ICD-10-CM

## 2015-10-13 DIAGNOSIS — K219 Gastro-esophageal reflux disease without esophagitis: Secondary | ICD-10-CM

## 2015-10-13 DIAGNOSIS — R778 Other specified abnormalities of plasma proteins: Secondary | ICD-10-CM | POA: Diagnosis present

## 2015-10-13 DIAGNOSIS — R0682 Tachypnea, not elsewhere classified: Secondary | ICD-10-CM

## 2015-10-13 DIAGNOSIS — J181 Lobar pneumonia, unspecified organism: Secondary | ICD-10-CM

## 2015-10-13 DIAGNOSIS — I4892 Unspecified atrial flutter: Secondary | ICD-10-CM

## 2015-10-13 DIAGNOSIS — R05 Cough: Secondary | ICD-10-CM

## 2015-10-13 DIAGNOSIS — Z951 Presence of aortocoronary bypass graft: Secondary | ICD-10-CM

## 2015-10-13 DIAGNOSIS — R55 Syncope and collapse: Secondary | ICD-10-CM

## 2015-10-13 DIAGNOSIS — I472 Ventricular tachycardia: Secondary | ICD-10-CM | POA: Diagnosis present

## 2015-10-13 DIAGNOSIS — Z8249 Family history of ischemic heart disease and other diseases of the circulatory system: Secondary | ICD-10-CM

## 2015-10-13 DIAGNOSIS — F419 Anxiety disorder, unspecified: Secondary | ICD-10-CM

## 2015-10-13 DIAGNOSIS — I2489 Other forms of acute ischemic heart disease: Secondary | ICD-10-CM

## 2015-10-13 DIAGNOSIS — J189 Pneumonia, unspecified organism: Principal | ICD-10-CM

## 2015-10-13 DIAGNOSIS — Z7901 Long term (current) use of anticoagulants: Secondary | ICD-10-CM

## 2015-10-13 DIAGNOSIS — I5023 Acute on chronic systolic (congestive) heart failure: Secondary | ICD-10-CM

## 2015-10-13 DIAGNOSIS — I248 Other forms of acute ischemic heart disease: Secondary | ICD-10-CM | POA: Diagnosis present

## 2015-10-13 DIAGNOSIS — F17200 Nicotine dependence, unspecified, uncomplicated: Secondary | ICD-10-CM

## 2015-10-13 DIAGNOSIS — I5189 Other ill-defined heart diseases: Secondary | ICD-10-CM

## 2015-10-13 DIAGNOSIS — I2510 Atherosclerotic heart disease of native coronary artery without angina pectoris: Secondary | ICD-10-CM

## 2015-10-13 DIAGNOSIS — I25118 Atherosclerotic heart disease of native coronary artery with other forms of angina pectoris: Secondary | ICD-10-CM

## 2015-10-13 DIAGNOSIS — R0602 Shortness of breath: Secondary | ICD-10-CM

## 2015-10-13 DIAGNOSIS — R000 Tachycardia, unspecified: Secondary | ICD-10-CM

## 2015-10-13 DIAGNOSIS — F17210 Nicotine dependence, cigarettes, uncomplicated: Secondary | ICD-10-CM | POA: Diagnosis present

## 2015-10-13 DIAGNOSIS — E139 Other specified diabetes mellitus without complications: Secondary | ICD-10-CM

## 2015-10-13 DIAGNOSIS — N179 Acute kidney failure, unspecified: Secondary | ICD-10-CM | POA: Diagnosis present

## 2015-10-13 DIAGNOSIS — I519 Heart disease, unspecified: Secondary | ICD-10-CM

## 2015-10-13 DIAGNOSIS — Z720 Tobacco use: Secondary | ICD-10-CM

## 2015-10-13 DIAGNOSIS — IMO0001 Reserved for inherently not codable concepts without codable children: Secondary | ICD-10-CM

## 2015-10-13 DIAGNOSIS — I4891 Unspecified atrial fibrillation: Secondary | ICD-10-CM

## 2015-10-13 DIAGNOSIS — I340 Nonrheumatic mitral (valve) insufficiency: Secondary | ICD-10-CM

## 2015-10-13 DIAGNOSIS — I10 Essential (primary) hypertension: Secondary | ICD-10-CM

## 2015-10-13 DIAGNOSIS — E876 Hypokalemia: Secondary | ICD-10-CM | POA: Diagnosis not present

## 2015-10-13 DIAGNOSIS — I214 Non-ST elevation (NSTEMI) myocardial infarction: Secondary | ICD-10-CM

## 2015-10-13 DIAGNOSIS — Z833 Family history of diabetes mellitus: Secondary | ICD-10-CM

## 2015-10-13 DIAGNOSIS — J9601 Acute respiratory failure with hypoxia: Secondary | ICD-10-CM

## 2015-10-13 DIAGNOSIS — I480 Paroxysmal atrial fibrillation: Secondary | ICD-10-CM | POA: Diagnosis present

## 2015-10-13 DIAGNOSIS — Z823 Family history of stroke: Secondary | ICD-10-CM

## 2015-10-13 DIAGNOSIS — R059 Cough, unspecified: Secondary | ICD-10-CM

## 2015-10-13 HISTORY — DX: Gastro-esophageal reflux disease without esophagitis: K21.9

## 2015-10-13 HISTORY — DX: Acute respiratory failure, unspecified whether with hypoxia or hypercapnia: J96.00

## 2015-10-13 HISTORY — DX: Pneumonia, unspecified organism: J18.9

## 2015-10-13 HISTORY — DX: Headache, unspecified: R51.9

## 2015-10-13 HISTORY — DX: Headache: R51

## 2015-10-13 HISTORY — DX: Unspecified osteoarthritis, unspecified site: M19.90

## 2015-10-13 LAB — HEPARIN LEVEL (UNFRACTIONATED): Heparin Unfractionated: <0.1 IU/mL — ABNORMAL LOW (ref 0.30–0.70)

## 2015-10-13 LAB — BASIC METABOLIC PANEL
ANION GAP: 9 (ref 5–15)
ANION GAP: 8 (ref 5–15)
BUN: 19 mg/dL (ref 6–20)
BUN: 20 mg/dL (ref 6–20)
CALCIUM: 8.9 mg/dL (ref 8.9–10.3)
CHLORIDE: 101 mmol/L (ref 101–111)
CO2: 27 mmol/L (ref 22–32)
CO2: 26 mmol/L (ref 22–32)
CREATININE: 1.33 mg/dL — AB (ref 0.61–1.24)
Calcium: 8.8 mg/dL — ABNORMAL LOW (ref 8.9–10.3)
Chloride: 106 mmol/L (ref 101–111)
Creatinine, Ser: 1.46 mg/dL — ABNORMAL HIGH (ref 0.61–1.24)
GFR calc Af Amer: >60 mL/min (ref >60)
GFR, EST NON AFRICAN AMERICAN: 54 mL/min — AB (ref >60)
GFR, EST NON AFRICAN AMERICAN: 60 mL/min — AB (ref >60)
GLUCOSE: 299 mg/dL — AB (ref 65–99)
Glucose, Bld: 128 mg/dL — ABNORMAL HIGH (ref 65–99)
POTASSIUM: 4.1 mmol/L (ref 3.5–5.1)
Potassium: 3.9 mmol/L (ref 3.5–5.1)
SODIUM: 140 mmol/L (ref 135–145)
Sodium: 137 mmol/L (ref 135–145)

## 2015-10-13 LAB — GLUCOSE, CAPILLARY
GLUCOSE-CAPILLARY: 191 mg/dL — AB (ref 65–99)
Glucose-Capillary: 212 mg/dL — ABNORMAL HIGH (ref 65–99)

## 2015-10-13 LAB — APTT: aPTT: 36 seconds (ref 24–37)

## 2015-10-13 LAB — PROTIME-INR
INR: 1.31 (ref 0.00–1.49)
Prothrombin Time: 16.4 seconds — ABNORMAL HIGH (ref 11.6–15.2)

## 2015-10-13 LAB — CBC
HEMATOCRIT: 39.5 % (ref 39.0–52.0)
HEMOGLOBIN: 13.2 g/dL (ref 13.0–17.0)
MCH: 28.7 pg (ref 26.0–34.0)
MCHC: 33.4 g/dL (ref 30.0–36.0)
MCV: 85.9 fL (ref 78.0–100.0)
Platelets: 196 10*3/uL (ref 150–400)
RBC: 4.6 MIL/uL (ref 4.22–5.81)
RDW: 13.8 % (ref 11.5–15.5)
WBC: 10.7 10*3/uL — AB (ref 4.0–10.5)

## 2015-10-13 LAB — LACTIC ACID, PLASMA: Lactic Acid, Venous: 1.1 mmol/L (ref 0.5–2.0)

## 2015-10-13 LAB — TROPONIN I
TROPONIN I: 0.2 ng/mL — AB (ref <0.031)
TROPONIN I: 0.16 ng/mL — AB (ref <0.031)
Troponin I: 0.2 ng/mL — ABNORMAL HIGH (ref <0.031)
Troponin I: 0.19 ng/mL — ABNORMAL HIGH (ref <0.031)
Troponin I: 0.16 ng/mL — ABNORMAL HIGH (ref <0.031)

## 2015-10-13 LAB — I-STAT TROPONIN, ED: Troponin i, poc: 0.22 ng/mL (ref 0.00–0.08)

## 2015-10-13 LAB — HIV ANTIBODY (ROUTINE TESTING W REFLEX): HIV SCREEN 4TH GENERATION: NONREACTIVE

## 2015-10-13 LAB — MRSA PCR SCREENING: MRSA BY PCR: NEGATIVE

## 2015-10-13 LAB — LIPID PANEL
CHOL/HDL RATIO: 3.7 ratio
CHOLESTEROL: 128 mg/dL (ref 0–200)
HDL: 35 mg/dL — AB (ref >40)
LDL Cholesterol: 80 mg/dL (ref 0–99)
Triglycerides: 67 mg/dL (ref <150)
VLDL: 13 mg/dL (ref 0–40)

## 2015-10-13 LAB — CBG MONITORING, ED
GLUCOSE-CAPILLARY: 253 mg/dL — AB (ref 65–99)
GLUCOSE-CAPILLARY: 136 mg/dL — AB (ref 65–99)
GLUCOSE-CAPILLARY: 205 mg/dL — AB (ref 65–99)
Glucose-Capillary: 323 mg/dL — ABNORMAL HIGH (ref 65–99)

## 2015-10-13 LAB — MAGNESIUM: MAGNESIUM: 1.9 mg/dL (ref 1.7–2.4)

## 2015-10-13 LAB — D-DIMER, QUANTITATIVE (NOT AT ARMC)
D DIMER QUANT: 0.83 ug{FEU}/mL — AB (ref 0.00–0.50)
D DIMER QUANT: 0.56 ug{FEU}/mL — AB (ref 0.00–0.50)

## 2015-10-13 MED ORDER — HYDRALAZINE HCL 50 MG PO TABS
75.0000 mg | ORAL_TABLET | Freq: Three times a day (TID) | ORAL | Status: DC
  Administered 2015-10-13: 50 mg via ORAL
  Administered 2015-10-13 – 2015-10-17 (×12): 75 mg via ORAL
  Filled 2015-10-13 (×13): qty 1

## 2015-10-13 MED ORDER — ACETAMINOPHEN 325 MG PO TABS: 650.0000 mg | ORAL_TABLET | Freq: Four times a day (QID) | ORAL | Status: DC | PRN

## 2015-10-13 MED ORDER — SPIRONOLACTONE 25 MG PO TABS
25.0000 mg | ORAL_TABLET | Freq: Every day | ORAL | Status: DC
  Administered 2015-10-13 – 2015-10-17 (×5): 25 mg via ORAL
  Filled 2015-10-13 (×5): qty 1

## 2015-10-13 MED ORDER — DOXYCYCLINE HYCLATE 100 MG PO TABS
100.0000 mg | ORAL_TABLET | Freq: Two times a day (BID) | ORAL | Status: DC
  Administered 2015-10-13 (×2): 100 mg via ORAL
  Filled 2015-10-13 (×2): qty 1

## 2015-10-13 MED ORDER — GUAIFENESIN ER 600 MG PO TB12
600.0000 mg | ORAL_TABLET | Freq: Two times a day (BID) | ORAL | Status: DC
  Administered 2015-10-13 (×2): 600 mg via ORAL
  Filled 2015-10-13 (×4): qty 1

## 2015-10-13 MED ORDER — IPRATROPIUM-ALBUTEROL 0.5-2.5 (3) MG/3ML IN SOLN: 3.0000 mL | RESPIRATORY_TRACT | Status: DC | PRN

## 2015-10-13 MED ORDER — NITROGLYCERIN 0.4 MG SL SUBL
0.4000 mg | SUBLINGUAL_TABLET | SUBLINGUAL | Status: DC | PRN
  Administered 2015-10-13 (×2): 0.4 mg via SUBLINGUAL
  Filled 2015-10-13: qty 1

## 2015-10-13 MED ORDER — NITROGLYCERIN 2 % TD OINT
1.0000 [in_us] | TOPICAL_OINTMENT | Freq: Four times a day (QID) | TRANSDERMAL | Status: DC
  Administered 2015-10-13 – 2015-10-14 (×3): 1 [in_us] via TOPICAL
  Filled 2015-10-13: qty 30

## 2015-10-13 MED ORDER — INSULIN ASPART 100 UNIT/ML ~~LOC~~ SOLN
0.0000 [IU] | Freq: Three times a day (TID) | SUBCUTANEOUS | Status: DC
  Administered 2015-10-13: 3 [IU] via SUBCUTANEOUS

## 2015-10-13 MED ORDER — SODIUM CHLORIDE 0.9 % IV SOLN
INTRAVENOUS | Status: DC
  Administered 2015-10-13: 14:00:00 via INTRAVENOUS

## 2015-10-13 MED ORDER — INSULIN ASPART 100 UNIT/ML ~~LOC~~ SOLN
0.0000 [IU] | SUBCUTANEOUS | Status: DC
Start: 2015-10-13 — End: 2015-10-13
  Administered 2015-10-13: 7 [IU] via SUBCUTANEOUS
  Filled 2015-10-13: qty 1

## 2015-10-13 MED ORDER — NITROGLYCERIN IN D5W 200-5 MCG/ML-% IV SOLN
5.0000 ug/min | INTRAVENOUS | Status: DC
  Administered 2015-10-13: 5 ug/min via INTRAVENOUS
  Filled 2015-10-13: qty 250

## 2015-10-13 MED ORDER — ISOSORBIDE MONONITRATE ER 30 MG PO TB24
30.0000 mg | ORAL_TABLET | Freq: Every day | ORAL | Status: DC
  Administered 2015-10-13 – 2015-10-16 (×4): 30 mg via ORAL
  Filled 2015-10-13 (×4): qty 1

## 2015-10-13 MED ORDER — IPRATROPIUM-ALBUTEROL 0.5-2.5 (3) MG/3ML IN SOLN
3.0000 mL | Freq: Four times a day (QID) | RESPIRATORY_TRACT | Status: DC
  Administered 2015-10-13 – 2015-10-16 (×10): 3 mL via RESPIRATORY_TRACT
  Filled 2015-10-13 (×12): qty 3

## 2015-10-13 MED ORDER — METOPROLOL TARTRATE 100 MG PO TABS
100.0000 mg | ORAL_TABLET | Freq: Two times a day (BID) | ORAL | Status: DC
  Administered 2015-10-13 – 2015-10-14 (×3): 100 mg via ORAL
  Filled 2015-10-13 (×3): qty 1
  Filled 2015-10-13: qty 4

## 2015-10-13 MED ORDER — CEFTRIAXONE SODIUM 1 G IJ SOLR
1.0000 g | INTRAMUSCULAR | Status: DC
  Filled 2015-10-13: qty 10

## 2015-10-13 MED ORDER — MORPHINE SULFATE (PF) 4 MG/ML IV SOLN
4.0000 mg | Freq: Once | INTRAVENOUS | Status: AC
  Administered 2015-10-13: 4 mg via INTRAVENOUS
  Filled 2015-10-13: qty 1

## 2015-10-13 MED ORDER — DOXYCYCLINE HYCLATE 100 MG IV SOLR
100.0000 mg | Freq: Once | INTRAVENOUS | Status: AC
  Administered 2015-10-13: 100 mg via INTRAVENOUS
  Filled 2015-10-13: qty 100

## 2015-10-13 MED ORDER — PANTOPRAZOLE SODIUM 40 MG PO TBEC
40.0000 mg | DELAYED_RELEASE_TABLET | Freq: Every day | ORAL | Status: DC
  Administered 2015-10-13 – 2015-10-14 (×2): 40 mg via ORAL
  Filled 2015-10-13 (×2): qty 1

## 2015-10-13 MED ORDER — BENZONATATE 100 MG PO CAPS
200.0000 mg | ORAL_CAPSULE | Freq: Three times a day (TID) | ORAL | Status: DC | PRN
  Filled 2015-10-13: qty 2

## 2015-10-13 MED ORDER — ALBUTEROL SULFATE (2.5 MG/3ML) 0.083% IN NEBU
2.5000 mg | INHALATION_SOLUTION | RESPIRATORY_TRACT | Status: DC | PRN
  Administered 2015-10-14: 2.5 mg via RESPIRATORY_TRACT

## 2015-10-13 MED ORDER — HEPARIN (PORCINE) IN NACL 100-0.45 UNIT/ML-% IJ SOLN
1200.0000 [IU]/h | INTRAMUSCULAR | Status: DC
  Administered 2015-10-13: 1200 [IU]/h via INTRAVENOUS
  Filled 2015-10-13: qty 250

## 2015-10-13 MED ORDER — ASPIRIN 81 MG PO CHEW
324.0000 mg | CHEWABLE_TABLET | Freq: Once | ORAL | Status: AC
  Administered 2015-10-13: 324 mg via ORAL
  Filled 2015-10-13: qty 4

## 2015-10-13 MED ORDER — RIVAROXABAN 20 MG PO TABS
20.0000 mg | ORAL_TABLET | Freq: Every day | ORAL | Status: DC
  Administered 2015-10-13 – 2015-10-16 (×4): 20 mg via ORAL
  Filled 2015-10-13 (×5): qty 1

## 2015-10-13 MED ORDER — MORPHINE SULFATE (PF) 2 MG/ML IV SOLN
1.0000 mg | INTRAVENOUS | Status: DC | PRN
  Administered 2015-10-13 (×2): 1 mg via INTRAVENOUS
  Filled 2015-10-13 (×2): qty 1

## 2015-10-13 MED ORDER — LORAZEPAM 0.5 MG PO TABS
0.5000 mg | ORAL_TABLET | Freq: Once | ORAL | Status: AC
  Administered 2015-10-13: 0.5 mg via ORAL
  Filled 2015-10-13: qty 1

## 2015-10-13 MED ORDER — INSULIN ASPART 100 UNIT/ML ~~LOC~~ SOLN
0.0000 [IU] | Freq: Every day | SUBCUTANEOUS | Status: DC
  Administered 2015-10-13: 2 [IU] via SUBCUTANEOUS

## 2015-10-13 MED ORDER — HEPARIN BOLUS VIA INFUSION
4000.0000 [IU] | Freq: Once | INTRAVENOUS | Status: AC
  Administered 2015-10-13: 4000 [IU] via INTRAVENOUS
  Filled 2015-10-13: qty 4000

## 2015-10-13 MED ORDER — ATORVASTATIN CALCIUM 80 MG PO TABS
80.0000 mg | ORAL_TABLET | Freq: Every evening | ORAL | Status: DC
  Administered 2015-10-13 – 2015-10-16 (×4): 80 mg via ORAL
  Filled 2015-10-13 (×5): qty 1

## 2015-10-13 MED ORDER — DEXTROSE 5 % IV SOLN
1.0000 g | Freq: Once | INTRAVENOUS | Status: AC
  Administered 2015-10-13: 1 g via INTRAVENOUS
  Filled 2015-10-13: qty 10

## 2015-10-13 MED ORDER — METOPROLOL TARTRATE 25 MG PO TABS: 12.5000 mg | ORAL_TABLET | Freq: Once | ORAL | Status: DC

## 2015-10-13 MED ORDER — INSULIN GLARGINE 100 UNIT/ML ~~LOC~~ SOLN
10.0000 [IU] | Freq: Every day | SUBCUTANEOUS | Status: DC
  Administered 2015-10-13: 10 [IU] via SUBCUTANEOUS
  Filled 2015-10-13 (×3): qty 0.1

## 2015-10-13 NOTE — ED Notes (Signed)
Entry error. Pt CBG is NOT 136. Attempted to obtain CBG could not get blood return. Nehemiah Settle, RN will attempt

## 2015-10-13 NOTE — Consult Note (Signed)
CARDIOLOGY CONSULT NOTE   Patient ID: Sean Hinton MRN: [id number], DOB/AGE: 1961-09-08   Admit date: 10/13/2015 Date of Consult: 10/13/2015 Reason for Consult: Chest Pain   Primary Physician: Lora Paula, MD Primary Cardiologist: Dr. Royann Shivers   HPI: Sean Hinton is a 54 y.o. male with past medical history of CAD (CTO in LAD, ostial 80%, RPDA-1 50%), ischemic cardiomyopathy (EF 40-45% in 01/2015) , PAF (on Xarelto), HTN, HLD, and tobacco abuse who presented to [facility] ED on 10/13/2015 for chest pain for the past two weeks, accompanied by shortness of breath and a productive cough.  Patient reports he has been having chest pain, worse with deep breathing and activity over the past two weeks. He has been taking gas relief tablets which he reports help with the pain. During that time frame, he also reports having a productive cough and shortness of breath. He has tried several OTC medications for his cough but reports none have worked.  He denies any chest pain prior to the onset of his shortness of breath.  His last cath was in 02/2015 and showed a CTO of the LAD, 50% stenosis in the RPDA-1 and 70% stenosis at the RPDA-2, and 80% stenosis in the [facility]. He reports good compliance with his medications.   While in the ED, cyclic troponin values have been stable at 0.20. His EKG shows sinus tachycardia with resolved TWI in the lateral leads. His CXR showed patchy multi-focal opacities within the left lower lobe and right perihilar region, consistent with mult-focal pneumonia. He had been admitted by Internal Medicine and is currently being treated for CAP.    Problem List Past Medical History  Diagnosis Date  . Diabetes mellitus with complication (HCC)   . Hypertension   . Syncope     a. 02/2015 - felt 2/2 rapid AF/AFL.  Marland Kitchen Atrial fibrillation and flutter (HCC)     a. Dx 02/2015 - placed on Xarelto.  . Ischemic cardiomyopathy   . CAD (coronary artery disease)     a.  Dx 02/2015 - 2V CAD with CTO LAD, diffuse dz in PDA, OM - med rx.  . Hypertensive heart disease   . Hyperlipidemia   . Mitral regurgitation     a. Mod by echo 02/2015,  . Tobacco abuse     Past Surgical History  Procedure Laterality Date  . Right thumb surgery     . Cardiac catheterization N/A 02/24/2015    Procedure: Left Heart Cath and Coronary Angiography;  Surgeon: Marykay Lex, MD;  Location: [facility] INVASIVE CV LAB CUPID;  Service: Cardiovascular;  Laterality: N/A;  . Cardiac catheterization  02/24/2015    Procedure: Coronary/Bypass Graft CTO Intervention;  Surgeon: Marykay Lex, MD;  Location: [facility] INVASIVE CV LAB CUPID;  Service: Cardiovascular;;     Allergies No Known Allergies    Inpatient Medications . atorvastatin  80 mg Oral QPM  . doxycycline  100 mg Oral Q12H  . guaiFENesin  600 mg Oral BID  . hydrALAZINE  75 mg Oral TID  . insulin aspart  0-9 Units Subcutaneous 6 times per day  . ipratropium-albuterol  3 mL Nebulization Q6H  . metoprolol tartrate  12.5 mg Oral Once  . pantoprazole  40 mg Oral Daily  . rivaroxaban  20 mg Oral Q supper  . spironolactone  25 mg Oral Daily    Family History Family History  Problem Relation Age of Onset  . Diabetes Mother   . Hypertension Mother   .  Heart disease Mother   . Cancer Mother     stomach cancer   . Diabetes Father   . Hypertension Father   . Heart disease Father   . Diabetes Sister   . Hypertension Sister   . Cancer Sister     breast cancer   . Diabetes Brother   . Hypertension Brother   . Heart disease Brother   . Stroke Brother   . Heart attack Mother   . Heart attack Father   . Heart attack Brother      Social History Social History   Social History  . Marital Status: Single    Spouse Name: N/A  . Number of Children: N/A  . Years of Education: N/A   Occupational History  . Not on file.   Social History Main Topics  . Smoking status: Current Every Day Smoker -- 0.50 packs/day for 30 years  .  Smokeless tobacco: Not on file  . Alcohol Use: No  . Drug Use: No     Comment: last use 1991  . Sexual Activity: Not on file   Other Topics Concern  . Not on file   Social History Narrative     Review of Systems General:  No chills, fever, night sweats or weight changes.  Cardiovascular:  No  edema, orthopnea, palpitations, paroxysmal nocturnal dyspnea. Positive for chest pain and dyspnea on exertion. Dermatological: No rash, lesions/masses Respiratory: Positive for productive cough and dyspnea Urologic: No hematuria, dysuria Abdominal:   No nausea, vomiting, diarrhea, bright red blood per rectum, melena, or hematemesis Neurologic:  No visual changes, wkns, changes in mental status. All other systems reviewed and are otherwise negative except as noted above.  Physical Exam Blood pressure 152/96, pulse 109, temperature 98.5 F (36.9 C), temperature source Oral, resp. rate 21, height 5\' 10"  (1.778 m), weight 190 lb 0.6 oz (86.2 kg), SpO2 95 %.  General: Pleasant, African American male in NAD Psych: Normal affect. Neuro: Alert and oriented X 3. Moves all extremities spontaneously. HEENT: Normal  Neck: Supple without bruits or JVD. Lungs:  Resp regular and unlabored, Rhonchi present throughout lung fields. No rales appreciated. Heart: Regular rhythm, tachycardic rate no s3, s4, or murmurs. Abdomen: Soft, non-tender, non-distended, BS + x 4.  Extremities: No clubbing, cyanosis or edema. DP/PT/Radials 2+ and equal bilaterally.  Labs   Recent Labs  10/13/15 0247 10/13/15 0700  TROPONINI 0.20* 0.20*   Lab Results  Component Value Date   WBC 10.7* 10/13/2015   HGB 13.2 10/13/2015   HCT 39.5 10/13/2015   MCV 85.9 10/13/2015   PLT 196 10/13/2015    Recent Labs Lab 10/13/15 0247  NA 137  K 4.1  CL 101  CO2 27  BUN 19  CREATININE 1.46*  CALCIUM 8.8*  GLUCOSE 299*   Lab Results  Component Value Date   CHOL 128 10/13/2015   HDL 35* 10/13/2015   LDLCALC 80  10/13/2015   TRIG 67 10/13/2015   Lab Results  Component Value Date   DDIMER 0.83* 10/13/2015    Radiology/Studies Dg Chest 2 View: 10/13/2015  CLINICAL DATA:  Initial evaluation for acute chest pain for 1 month. EXAM: CHEST  2 VIEW COMPARISON:  Prior radiograph from 02/20/2015. FINDINGS: Cardiomegaly is stable from previous study. Mediastinal silhouette within normal limits. Lung volumes within normal limits. There is parenchymal opacity within the left lower lobe, suspicious for possible pneumonia. Additional patchy infiltrate within the right perihilar region, likely within the right upper and lower lobes.  Find a concerning for possible infection. Mild scattered interstitial prominence without pulmonary edema. No pleural effusion. No pneumothorax. Osseous structures demonstrate no acute abnormality. Mild multilevel degenerative changes within the visualized spine. IMPRESSION: Patchy multi focal opacities within the left lower lobe and right perihilar region, suspicious for possible multi focal pneumonia. Electronically Signed   By: Rise Mu M.D.   On: 10/13/2015 03:49    ECG: Sinus tach. Rate in 110's.  ECHOCARDIOGRAM:  Study Conclusions - Left ventricle: The cavity size was normal. There was mild concentric hypertrophy. Systolic function was mildly to moderately reduced. The estimated ejection fraction was in the range of 40% to 45%. Severe hypokinesis of the apicalanteroseptal, anterior, inferior, and apical myocardium; consistent with ischemia in the distribution of the right coronary and left anterior descending coronary artery. Doppler parameters are consistent with elevated mean left atrial filling pressure. Acoustic contrast opacification revealed no evidence ofthrombus. - Mitral valve: There was moderate regurgitation directed centrally. - Left atrium: The atrium was severely dilated. - Atrial septum: The septum bowed from left to right,  consistent with increased left atrial pressure. No defect or patent foramen ovale was identified.  ASSESSMENT AND PLAN  1. Chest Pain with Typical and Atypical Features - history of CAD with most recent cath in 02/2015 showing CTO of the LAD, 50% stenosis in the RPDA-1 and 70% stenosis at the RPDA-2, and 80% stenosis in the [facility]. - chest pain is worse with inspiration and relieved with gas relief tablets which makes it atypical yet he reports it has an exertional quality. Started at the same time as his shortness of breath and productive cough.  - EKG shows no acute ischemic changes. Troponin values have been stable at 0.20 this admission. - discontinue NTG drip. Start Imdur 30mg  daily. - Continue ASA, Statin, and BB.   2. Atrial Fibrillation This patients CHA2DS2-VASc Score and unadjusted Ischemic Stroke Rate (% per year) is equal to 3.2 % stroke rate/year from a score of 3 (HTN, CHF, Vascular). Patient's heparin level was undetectable, which pharmacy says makes his compliance with Xarelto less likely. Will need to continue to address if he was complaint with Xarelto as an outpatient or not. - currently in sinus tachycardia. Continue home Metoprolol 100mg  BID.  3. CAP - per admitting team  4. Tobacco Abuse - smoking cessation advised.  Signed, , PA-C 10/13/2015, 8:06 AM Pager: [phone] Patient seen and examined and history reviewed. Agree with above findings and plan. 54 yo BM with known CHF and CAD presents with progressive cough, chest pain, SOB and fever. Chest pain and neck pain precipitated by coughing. By cath in July 2016 he has CTO of mid LAD managed medically after failed PCI- unable to cross. States he hasn't had any angina since then on medical therapy. On exam he is a thin BM in NAD. Actively coughing. Lungs with coarse rhonchi bilaterally. No gallop or murmur. No edema.  CXR consistent with multilobar PNA. Ecg shows sinus tachy with old  anterior infarct. No acute changes. Troponin mildly elevated at 0.2 x 2. Impression: 1. PNA multilobar 2. Atypical chest pain- history more consistent with musculoskeletal pain related to severe cough 3. Elevated troponin. I think this is related to demand ischemia with known CAD in setting of severe PNA 4. History of atrial fibrillation  Plan: continue home beta blocker dose metoprolol 100 mg bid. Resume Xarelto. I don't think IV heparin needed. Start oral nitrates. OK to eat. Need to treat PNA.  No further ischemic work up planned. I reviewed prior cath studies- CTO of LAD is not ideal for CTO PCI. Occlusion is probable over 20 mm, there are no interventional collaterals, there is calcification and collaterals are poor and fill very late.   Madeleine Fenn [place], MDFACC 10/13/2015 10:17 AM

## 2015-10-13 NOTE — ED Notes (Signed)
Pt c/o L sided chest pressure, productive cough x 3 weeks with clear sputum. Pt also c/o R sided neck stiffness and pain, reports feeling like he may have pulled a muscle from coughing. Pt speaking in short sentences, lung sounds clear.

## 2015-10-13 NOTE — ED Notes (Signed)
Pt. reports central/left chest pain for 2 weeks with SOB and productive cough / palpitations worse today , denies nausea or diaphoresis .

## 2015-10-13 NOTE — ED Notes (Signed)
Spoke to [place] with cardiology to inform her that pt had a 10 beat run of v-tach. Pt remains asymptomatic.

## 2015-10-13 NOTE — Progress Notes (Signed)
PROGRESS NOTE    Sean Hinton SWN:[id number] DOB: April 02, 1961 DOA: 10/13/2015 PCP: Lora Paula, MD  HPI/Brief narrative 54 year old male patient with history of CAD, ischemic CM (LVEF 40-45 percent in April 2016, chronic systolic CHF, PAF on xarelto, HTN, HLD, tobacco abuse, DM 2 presented to [facility] ED on 10/13/15 for precordial pressure type of chest pain-worse with activity, followed several days later by productive cough and dyspnea. In the ED, chest x-ray showed multiple patchy opacities suggestive of pneumonia, tachypnea, tachycardia, glucose 299 and troponin of 0.22. Cardiology consulted.   Assessment/Plan:  Community acquired pneumonia - Started empirically on IV Rocephin and doxycycline. Continue same. - will need follow-up chest x-ray in 3-4 weeks to ensure resolution of pneumonia findings - Lactate: Normal, blood cultures 2: Pending, HIV antibody: Pending & sputum culture yet to be sent. - Tobacco cessation counseled.  Atypical chest pain/elevated troponin - Cardiology input appreciated: Feel that he has atypical chest pain and his history is more consistent with musculoskeletal pain related to cough. They recommend continuing home dose of metoprolol, resume Xarelto, DC IV Heparin, start nitrates. No further ischemic workup planned. Dr. [place] has reviewed prior Studies: CTO of LAD is not ideal for CTO PCI. - Elevated troponin likely related to demand ischemia from pneumonia in patient with known CAD.  Paroxysmal atrial fibrillation - Currently in sinus rhythm. Continue metoprolol and anticoagulation as above.  Essential hypertension - Uncontrolled. Continue hydralazine and metoprolol and follow closely.  Hyperlipidemia - Statins.  Type II DM - Follow A1c. SSI.  Tobacco abuse - Cessation counseled.  CAD - Evaluation and management as indicated above.  NSVT - As per ED nursing, patient had a 10 beat asymptomatic NSVT. Monitor telemetry. Continue Metoprolol.  K 4.1. Check Mg  Acute kidney injury - Possibly from dehydration. Brief IV fluids and follow BMP.   DVT prophylaxis: Patient will be on full dose Xarelto AC Code Status: Full Family Communication: None at bedside Disposition Plan: DC home when medically stable, possibly in 3-4 days. After cardiology input, patient's care was transitioned from the ED from step down to telemetry.    Consultants:  Cardiology  Procedures:  None  Antibiotics:  IV Rocephin 12/18 >  Doxycycline 2/18 >   Subjective:   Objective: Filed Vitals:   10/13/15 1114 10/13/15 1145 10/13/15 1200 10/13/15 1215  BP: 159/104 144/104 146/102 144/99  Pulse: 109     Temp:      TempSrc:      Resp:  19 21 24   Height:      Weight:      SpO2:  94%     No intake or output data in the 24 hours ending 10/13/15 1258 Filed Weights   10/13/15 0408  Weight: 86.2 kg (190 lb 0.6 oz)   temperature 98.51F.   Exam:  General exam: pleasant middle-aged male lying comfortably on the gurney in the ED this morning. Respiratory system: Harsh breath sounds bilaterally. No increased work of breathing. Cardiovascular system: S1 & S2 heard, RRR. No JVD, murmurs, gallops, clicks or pedal edema. telemetry: Sinus rhythm.  Gastrointestinal system: Abdomen is nondistended, soft and nontender. Normal bowel sounds heard. Central nervous system: Alert and oriented. No focal neurological deficits. Extremities: Symmetric 5 x 5 power.   Data Reviewed: Basic Metabolic Panel:  Recent Labs Lab 10/13/15 0247  NA 137  K 4.1  CL 101  CO2 27  GLUCOSE 299*  BUN 19  CREATININE 1.46*  CALCIUM 8.8*   Liver Function Tests: No  results for input(s): AST, ALT, ALKPHOS, BILITOT, PROT, ALBUMIN in the last 168 hours. No results for input(s): LIPASE, AMYLASE in the last 168 hours. No results for input(s): AMMONIA in the last 168 hours. CBC:  Recent Labs Lab 10/13/15 0247  WBC 10.7*  HGB 13.2  HCT 39.5  MCV 85.9  PLT 196    Cardiac Enzymes:  Recent Labs Lab 10/13/15 0247 10/13/15 0700 10/13/15 1015  TROPONINI 0.20* 0.20* 0.19*   BNP (last 3 results) No results for input(s): PROBNP in the last 8760 hours. CBG:  Recent Labs Lab 10/13/15 0256 10/13/15 0802 10/13/15 0808 10/13/15 1107  GLUCAP 253* 136* 205* 323*    No results found for this or any previous visit (from the past 240 hour(s)).         Studies: Dg Chest 2 View  10/13/2015  CLINICAL DATA:  Initial evaluation for acute chest pain for 1 month. EXAM: CHEST  2 VIEW COMPARISON:  Prior radiograph from 02/20/2015. FINDINGS: Cardiomegaly is stable from previous study. Mediastinal silhouette within normal limits. Lung volumes within normal limits. There is parenchymal opacity within the left lower lobe, suspicious for possible pneumonia. Additional patchy infiltrate within the right perihilar region, likely within the right upper and lower lobes. Find a concerning for possible infection. Mild scattered interstitial prominence without pulmonary edema. No pleural effusion. No pneumothorax. Osseous structures demonstrate no acute abnormality. Mild multilevel degenerative changes within the visualized spine. IMPRESSION: Patchy multi focal opacities within the left lower lobe and right perihilar region, suspicious for possible multi focal pneumonia. Electronically Signed   By: Rise Mu M.D.   On: 10/13/2015 03:49        Scheduled Meds: . atorvastatin  80 mg Oral QPM  . doxycycline  100 mg Oral Q12H  . guaiFENesin  600 mg Oral BID  . hydrALAZINE  75 mg Oral TID  . insulin aspart  0-9 Units Subcutaneous 6 times per day  . ipratropium-albuterol  3 mL Nebulization Q6H  . isosorbide mononitrate  30 mg Oral Daily  . metoprolol  100 mg Oral BID  . pantoprazole  40 mg Oral Daily  . rivaroxaban  20 mg Oral Q supper  . spironolactone  25 mg Oral Daily   Continuous Infusions: . cefTRIAXone (ROCEPHIN)  IV Stopped (10/13/15 0734)  .  heparin 1,200 Units/hr (10/13/15 1155)    Principal Problem:   Community acquired pneumonia Active Problems:   Essential hypertension   Hyperlipidemia   Smoking   Diabetes mellitus without complication (HCC)   CAD (coronary artery disease)   Elevated troponin   Cough   Chest pain   CAP (community acquired pneumonia)    Time spent: 35 minutes.    Marcellus Scott, MD, FACP, FHM. [facility] Pager [phone]  If 7PM-7AM, please contact night-coverage [email or web address] Password [email or web address] 10/13/2015, 12:58 PM    LOS: 0 days

## 2015-10-13 NOTE — ED Notes (Signed)
Spoke to Dr. Waymon Amato who will change the bed order to telemetry per cards.

## 2015-10-13 NOTE — ED Notes (Signed)
Lab to add on d-dimer 

## 2015-10-13 NOTE — Progress Notes (Addendum)
ANTICOAGULATION CONSULT NOTE - Initial Consult  Pharmacy Consult for heparin Indication: chest pain/ACS  No Known Allergies  Patient Measurements: Height: 5\' 10"  (177.8 cm) Weight: 190 lb 0.6 oz (86.2 kg) IBW/kg (Calculated) : 73  Vital Signs: Temp: 98.5 F (36.9 C) (12/19 0239) Temp Source: Oral (12/19 0239) BP: 164/104 mmHg (12/19 0400) Pulse Rate: 116 (12/19 0239)  Labs:  Recent Labs  10/13/15 0247  HGB 13.2  HCT 39.5  PLT 196  LABPROT 16.4*  INR 1.31  CREATININE 1.46*    Estimated Creatinine Clearance: 61.1 mL/min (by C-G formula based on Cr of 1.46).   Medical History: Past Medical History  Diagnosis Date  . Diabetes mellitus with complication (HCC)   . Hypertension   . Syncope     a. 02/2015 - felt 2/2 rapid AF/AFL.  03/2015 Atrial fibrillation and flutter (HCC)     a. Dx 02/2015 - placed on Xarelto.  . Ischemic cardiomyopathy   . CAD (coronary artery disease)     a. Dx 02/2015 - 2V CAD with CTO LAD, diffuse dz in PDA, OM - med rx.  . Hypertensive heart disease   . Hyperlipidemia   . Mitral regurgitation     a. Mod by echo 02/2015,  . Tobacco abuse      Assessment: 54yo male c/o central/left CP associated w/ SOB, cough, and palpitations, worsened today, concern for ACS; of note pt is supposed to be on Xarelto for Afib, when initially questioned he stated he hasn't taken any meds in at least a month but then changed his mind and stated that he did take Xarelto yesterday but cannot recall when; EDP has ordered heparin gtt for elevated troponin.  Goal of Therapy:  Heparin level 0.3-0.7 units/ml aPTT 66-102 seconds Monitor platelets by anticoagulation protocol: Yes   Plan:  Will obtain stat heparin level to attempt to verify whether pt has been taking Xarelto; if heparin level WNL will start heparin, but will need to wait 24 hours after last Xarelto dose if pt has been taking.  54yo, PharmD, BCPS  10/13/2015,4:28 AM    ADDENDUM: Heparin level  undetectable, would likely be elevated if Xarelto was taken within the last 24hr; okay to start heparin gtt.  Will give heparin 4000 units IV bolus x1 followed by gtt at 1200 units/hr and monitor heparin levels and CBC.   VB 5:35 AM

## 2015-10-13 NOTE — Progress Notes (Signed)
After pt used the bathroom, pt started complaining of 9/10 chest pain, neck pain, abdominal pain, and back pain. Also had complaints of not being able to breathe. Got pt settled back in bed, back on the monitor and back on O2. Obtained EKG. Gave pt 2 SL nitro that bought pain down to a 7/10. Pt refused third nitro. Gave pt 1 mg of morphine. BP at that time 149/111, pulse 102, spo2 94 on 2 L. Notified Craige Cotta NP about pt changes. Pt resting. Will continue to monitor.  Tera Helper E

## 2015-10-13 NOTE — H&P (Signed)
[facility] History and Physical  Sean Hinton WJX:[id number] DOB: 27-Feb-1961 DOA: 10/13/2015  Referring physician: ED PCP: Lora Paula, MD   Chief Complaint: cough and chest pain  HPI:  Sean Hinton is a 54 year old male with a past medical history of hypertension, diabetes mellitus type 2, A. Fib on Xarelto, ischemic cardiomyopathy, and CAD; who presents with complaints of cough and chest pain. Symptoms initially started 3 weeks ago with a productive cough of white sputum. Patient denies any color change in sputum. Reports associated symptoms of shortness of breath along with with the cough. He reports coughing spells in which he feels he is unable to breathe. Patient notes taking over-the-counter medications to try and relieve symptoms without relief. He also continued to smoke although he only reports smoking 4 cigarettes per day. Furthermore patient complained of left-sided chest pain that has been intermittently coming and going over this 3 week period. Chest pain exacerbated by coughing or any significant activity such as walking. Only thing that relieves chest pain is rest.  Upon admission to the emergency department patient was found to have a chest x-ray which showed multiple patchy opacities of the left lower lobe and the right perihilar region. Pulse was 116, respiratory rate 27 O2 sats 95%,WBC was 10.7, glucose 299, AG 9, trop 0.22.    Review of Systems  Constitutional: Positive for malaise/fatigue. Negative for weight loss.  HENT: Negative for ear pain and hearing loss.   Eyes: Negative for photophobia and pain.  Respiratory: Positive for cough, sputum production and shortness of breath. Negative for hemoptysis.   Cardiovascular: Positive for chest pain. Negative for leg swelling.  Gastrointestinal: Positive for abdominal pain and diarrhea.  Genitourinary: Negative for urgency and frequency.  Musculoskeletal: Negative for back pain and neck pain.  Skin: Negative  for itching and rash.  Neurological: Negative for speech change and focal weakness.  Endo/Heme/Allergies: Negative for environmental allergies. Does not bruise/bleed easily.  Psychiatric/Behavioral: Negative for suicidal ideas and substance abuse.      Past Medical History  Diagnosis Date  . Diabetes mellitus with complication (HCC)   . Hypertension   . Syncope     a. 02/2015 - felt 2/2 rapid AF/AFL.  Marland Kitchen Atrial fibrillation and flutter (HCC)     a. Dx 02/2015 - placed on Xarelto.  . Ischemic cardiomyopathy   . CAD (coronary artery disease)     a. Dx 02/2015 - 2V CAD with CTO LAD, diffuse dz in PDA, OM - med rx.  . Hypertensive heart disease   . Hyperlipidemia   . Mitral regurgitation     a. Mod by echo 02/2015,  . Tobacco abuse      Past Surgical History  Procedure Laterality Date  . Right thumb surgery     . Cardiac catheterization N/A 02/24/2015    Procedure: Left Heart Cath and Coronary Angiography;  Surgeon: Marykay Lex, MD;  Location: [facility] INVASIVE CV LAB CUPID;  Service: Cardiovascular;  Laterality: N/A;  . Cardiac catheterization  02/24/2015    Procedure: Coronary/Bypass Graft CTO Intervention;  Surgeon: Marykay Lex, MD;  Location: [facility] INVASIVE CV LAB CUPID;  Service: Cardiovascular;;      Social History:  reports that he has been smoking.  He does not have any smokeless tobacco history on file. He reports that he does not drink alcohol or use illicit drugs. Where does patient live--home Can patient participate in ADLs? Yes  No Known Allergies  Family History  Problem Relation Age of Onset  .  Diabetes Mother   . Hypertension Mother   . Heart disease Mother   . Cancer Mother     stomach cancer   . Diabetes Father   . Hypertension Father   . Heart disease Father   . Diabetes Sister   . Hypertension Sister   . Cancer Sister     breast cancer   . Diabetes Brother   . Hypertension Brother   . Heart disease Brother   . Stroke Brother   . Heart attack Mother    . Heart attack Father   . Heart attack Brother       Prior to Admission medications   Medication Sig Start Date End Date Taking? Authorizing Provider  furosemide (LASIX) 40 MG tablet Take 1 tablet (40 mg total) by mouth daily. 02/25/15  Yes Dayna N Dunn, PA-C  rivaroxaban (XARELTO) 20 MG TABS tablet Take 1 tablet (20 mg total) by mouth daily with supper. 02/25/15  Yes Dayna N Dunn, PA-C  spironolactone (ALDACTONE) 25 MG tablet Take 1 tablet (25 mg total) by mouth daily. 02/25/15  Yes Dayna N Dunn, PA-C  atorvastatin (LIPITOR) 80 MG tablet Take 1 tablet (80 mg total) by mouth every evening. Patient not taking: Reported on 10/13/2015 02/25/15   Dayna N Dunn, PA-C  Dexlansoprazole (DEXILANT) 30 MG capsule Take 1 capsule (30 mg total) by mouth daily. Patient not taking: Reported on 10/13/2015 12/30/14   Dessa Phi, MD  glipiZIDE (GLUCOTROL) 5 MG tablet Take 1 tablet (5 mg total) by mouth 2 (two) times daily before a meal. Patient not taking: Reported on 10/13/2015 03/06/15   Doris Cheadle, MD  hydrALAZINE (APRESOLINE) 50 MG tablet Take 1.5 tablets (75 mg total) by mouth 3 (three) times daily. Patient not taking: Reported on 10/13/2015 02/25/15   Dayna N Dunn, PA-C  lisinopril (PRINIVIL,ZESTRIL) 40 MG tablet Take 1 tablet (40 mg total) by mouth daily. Patient not taking: Reported on 10/13/2015 03/06/15   Doris Cheadle, MD  metoprolol (LOPRESSOR) 100 MG tablet Take 1 tablet (100 mg total) by mouth 2 (two) times daily. Patient not taking: Reported on 10/13/2015 02/25/15   Dayna N Dunn, PA-C  nitroGLYCERIN (NITROSTAT) 0.4 MG SL tablet Place 1 tablet (0.4 mg total) under the tongue every 5 (five) minutes as needed for chest pain (up to 3 doses). 02/25/15   Laurann Montana, PA-C     Physical Exam: Filed Vitals:   10/13/15 0239 10/13/15 0400 10/13/15 0408  BP: 166/116 164/104   Pulse: 116    Temp: 98.5 F (36.9 C)    TempSrc: Oral    Resp: 18 25   Height:    (1.778 m)  Weight:   86.2 kg (190 lb 0.6  oz)  SpO2: 95%       Constitutional: Vital signs reviewed. Patient is well-developed, but in  moderate distress appearing  uncomfortable on the hospital bed. Head: Normocephalic and atraumatic  Ear: TM normal bilaterally  Mouth: no erythema or exudates, MMM  Eyes: PERRL, EOMI, conjunctivae normal, No scleral icterus.  Neck: Supple, Trachea midline normal ROM, No JVD, mass, thyromegaly, or carotid bruit present.  Cardiovascular: tachycardic  Pulmonary/Chest: tachypnea with mild rhonchi appreciated over the left lung base. Patient also noted to repeatedly going to coughing spells. Abdominal: generalized tenderness to palpation (patient confirms that this is chronic) positive bowel sounds in all 4 quadrants GU: no CVA tenderness Musculoskeletal: No joint deformities, erythema, or stiffness, ROM full and no nontender Ext: no edema and no cyanosis,  pulses palpable bilaterally (DP and PT)  Hematology: no cervical, inginal, or axillary adenopathy.  Neurological: A&O x3, Strenght is normal and symmetric bilaterally, cranial nerve II-XII are grossly intact, no focal motor deficit, sensory intact to light touch bilaterally.  Skin: Warm, dry and intact. No rash, cyanosis, or clubbing.  Psychiatric: Normal mood and affect. speech and behavior is normal. Judgment and thought content normal. Cognition and memory are normal.      Data Review   Micro Results No results found for this or any previous visit (from the past 240 hour(s)).  Radiology Reports Dg Chest 2 View  10/13/2015  CLINICAL DATA:  Initial evaluation for acute chest pain for 1 month. EXAM: CHEST  2 VIEW COMPARISON:  Prior radiograph from 02/20/2015. FINDINGS: Cardiomegaly is stable from previous study. Mediastinal silhouette within normal limits. Lung volumes within normal limits. There is parenchymal opacity within the left lower lobe, suspicious for possible pneumonia. Additional patchy infiltrate within the right perihilar region,  likely within the right upper and lower lobes. Find a concerning for possible infection. Mild scattered interstitial prominence without pulmonary edema. No pleural effusion. No pneumothorax. Osseous structures demonstrate no acute abnormality. Mild multilevel degenerative changes within the visualized spine. IMPRESSION: Patchy multi focal opacities within the left lower lobe and right perihilar region, suspicious for possible multi focal pneumonia. Electronically Signed   By: Rise Mu M.D.   On: 10/13/2015 03:49     CBC  Recent Labs Lab 10/13/15 0247  WBC 10.7*  HGB 13.2  HCT 39.5  PLT 196  MCV 85.9  MCH 28.7  MCHC 33.4  RDW 13.8    Chemistries   Recent Labs Lab 10/13/15 0247  NA 137  K 4.1  CL 101  CO2 27  GLUCOSE 299*  BUN 19  CREATININE 1.46*  CALCIUM 8.8*   ------------------------------------------------------------------------------------------------------------------ estimated creatinine clearance is 61.1 mL/min (by C-G formula based on Cr of 1.46). ------------------------------------------------------------------------------------------------------------------ No results for input(s): HGBA1C in the last 72 hours. ------------------------------------------------------------------------------------------------------------------ No results for input(s): CHOL, HDL, LDLCALC, TRIG, CHOLHDL, LDLDIRECT in the last 72 hours. ------------------------------------------------------------------------------------------------------------------ No results for input(s): TSH, T4TOTAL, T3FREE, THYROIDAB in the last 72 hours.  Invalid input(s): FREET3 ------------------------------------------------------------------------------------------------------------------ No results for input(s): VITAMINB12, FOLATE, FERRITIN, TIBC, IRON, RETICCTPCT in the last 72 hours.  Coagulation profile  Recent Labs Lab 10/13/15 0247  INR 1.31    No results for input(s): DDIMER in  the last 72 hours.  Cardiac Enzymes No results for input(s): CKMB, TROPONINI, MYOGLOBIN in the last 168 hours.  Invalid input(s): CK ------------------------------------------------------------------------------------------------------------------ Invalid input(s): POCBNP   CBG:  Recent Labs Lab 10/13/15 0256  GLUCAP 253*       EKG: Independently reviewed. Sinus tachycardia   Assessment/Plan Principal Problem:  Community acquired pneumonia: acute. Patient with reports of cough with sputum production 3 weeks or longer. Chest x-ray showing a multi focal pneumonia. WBC count 10.7 on admission Patient also found with tachycardia and increased respiratory ratewhich gives concern for the  possibility of sepsis. - Checking stat lactic acid level - Check sputum Gram stain and cultures - check d dimer - nasal cannula oxygen as needed to keep O2 sats greater than 92% - empiric antibiotics of doxycycline and Rocephin - DuoNeb's scheduled q 6hr and prn - Mucinex  - Tessalon Perles prn cough  Chest Pain with Elevated troponin: Initial troponin elevated at 0.22 on admission. EKG is showing sinus tachycardia.Suspect patient's symptoms likely secondary to demand ischemia. Suspect NSTEMI type 2 - cardiology consulted and ED  -  trending troponin's x3 - Nitro gtt for chest pain  A. Fib on Xarelto: patient's compliance with this medication is unknown if patient is not taking multiple other medications that were previously advised.patient's initial EKG showing sinus tachycardia. - Heparin drip per pharmacy - f/u cardiology recommendations.  Essential hypertension - Continue hydralazine - We'll need to restart other medications including metoprolol unable   Hyperlipidemia:  - restarting atorvastatin - Check and lipid panel  Diabetes mellitus without complication (HCC) - check repeat hemoglobin A1c    CAD (coronary artery disease):patient with a previous history ofleft heart  catheterization 05/02/2016with severe 2 vessel disease with 100% CTO of the mid LAD with diffuse disease in the PDA. Recommended medical management at that time    Smoking: Patient initially presents smelling of cigarette smoke stating that he only smokes 4 cigarettes per day. - Patient will need continued smoking cessation counseling  Code Status:   full Family Communication: bedside Disposition Plan: admit   Total time spent 55 minutes.Greater than 50% of this time was spent in counseling, explanation of diagnosis, planning of further management, and coordination of care  Clydie Braun [facility] Pager [phone]  If 7PM-7AM, please contact night-coverage [email or web address] Password [email or web address] 10/13/2015, 4:57 AM

## 2015-10-13 NOTE — ED Notes (Signed)
Admitting MD at bedside.

## 2015-10-13 NOTE — ED Notes (Signed)
Paged admitting regarding bed request

## 2015-10-13 NOTE — Progress Notes (Signed)
ANTICOAGULATION CONSULT NOTE - Follow Up Consult  Pharmacy Consult for  IV heparin -> Xarelto Indication: atrial fibrillation  No Known Allergies  Patient Measurements: Height: 5\' 10"  (177.8 cm) Weight: 163 lb 9.3 oz (74.2 kg) IBW/kg (Calculated) : 73  Vital Signs: Temp: 98 F (36.7 C) (12/19 1230) Temp Source: Oral (12/19 1230) BP: 140/102 mmHg (12/19 1230) Pulse Rate: 109 (12/19 1114)  Labs:  Recent Labs  10/13/15 0247 10/13/15 0700 10/13/15 1015  HGB 13.2  --   --   HCT 39.5  --   --   PLT 196  --   --   APTT 36  --   --   LABPROT 16.4*  --   --   INR 1.31  --   --   HEPARINUNFRC <0.10*  --   --   CREATININE 1.46*  --   --   TROPONINI 0.20* 0.20* 0.19*    Estimated Creatinine Clearance: 61.1 mL/min (by C-G formula based on Cr of 1.46).  Assessment:   IV heparin begun overnight for chest pain, but now to transition back to Xarelto for afib, as prior to admission.  Chest pain thought related to cough/musculoskeletal pain.   Patient currently sleeping, did not awaken.  Goal of Therapy:  appropriate Xarelto dose for renal function and indication Monitor platelets by anticoagulation protocol: Yes   Plan:   Xarelto 20 mg daily with supper.  Intermittent CBC.  Reinforce Xarelto compliance.  10/15/15, Dennie Fetters Pager: [phone] 10/13/2015,2:23 PM

## 2015-10-13 NOTE — ED Notes (Signed)
Pt noted to have a 10 beat run of v-tach. Pt asymptomatic. Notified admitting physician who said to page cards.

## 2015-10-13 NOTE — ED Notes (Signed)
Cardiology at bedside.

## 2015-10-13 NOTE — Progress Notes (Signed)
Patient refusing to wear Pulse Ox; states he feels constricted wearing it. Patient educated and advised to keep on per  MD orders. Patient still refuses. Patient also c/o feeling anxious; requesting antianxiety medication. Dr. Waymon Amato text paged with this information. Patient with O2 at 95% on 2L during spot check.  Sean Muir Zeric Baranowski,RN

## 2015-10-13 NOTE — ED Provider Notes (Addendum)
CSN: [id number]     Arrival date & time 10/13/15  0228 History  By signing my name below, I, Bethel Born, attest that this documentation has been prepared under the direction and in the presence of Derwood Kaplan, MD. Electronically Signed: Bethel Born, ED Scribe. 10/13/2015. 9:56 AM   Chief Complaint  Patient presents with  . Chest Pain    The history is provided by the patient. No language interpreter was used.   Sean Hinton is a 54 y.o. male with PMHx of CAD, ischemic cardiomyopathy, a-fib, DM, HTN, and HLD,  who presents to the Emergency Department complaining of constant, 8/10 in severity, left-sided chest pain with onset 2 -3 weeks ago. He describes the pain as pressure and notes that it is worse today. Walking and coughing exacerbate the pain. Rest and elevating the feet imrpove the pain.  Associated symptoms include SOB with walking from one room to the next, cough productive of clear sputum. Pt denies drug use. He uses tobacco but denies illicit drug use. Pt denies history of COPD or asthma.   Past Medical History  Diagnosis Date  . Diabetes mellitus with complication (HCC)   . Hypertension   . Syncope     a. 02/2015 - felt 2/2 rapid AF/AFL.  Marland Kitchen Atrial fibrillation and flutter (HCC)     a. Dx 02/2015 - placed on Xarelto.  . Ischemic cardiomyopathy   . CAD (coronary artery disease)     a. Dx 02/2015 - 2V CAD with CTO LAD, diffuse dz in PDA, OM - med rx.  . Hypertensive heart disease   . Hyperlipidemia   . Mitral regurgitation     a. Mod by echo 02/2015,  . Tobacco abuse    Past Surgical History  Procedure Laterality Date  . Right thumb surgery     . Cardiac catheterization N/A 02/24/2015    Procedure: Left Heart Cath and Coronary Angiography;  Surgeon: Marykay Lex, MD;  Location: [facility] INVASIVE CV LAB CUPID;  Service: Cardiovascular;  Laterality: N/A;  . Cardiac catheterization  02/24/2015    Procedure: Coronary/Bypass Graft CTO Intervention;  Surgeon: Marykay Lex, MD;  Location: [facility] INVASIVE CV LAB CUPID;  Service: Cardiovascular;;   Family History  Problem Relation Age of Onset  . Diabetes Mother   . Hypertension Mother   . Heart disease Mother   . Cancer Mother     stomach cancer   . Diabetes Father   . Hypertension Father   . Heart disease Father   . Diabetes Sister   . Hypertension Sister   . Cancer Sister     breast cancer   . Diabetes Brother   . Hypertension Brother   . Heart disease Brother   . Stroke Brother   . Heart attack Mother   . Heart attack Father   . Heart attack Brother    Social History  Substance Use Topics  . Smoking status: Current Every Day Smoker -- 0.50 packs/day for 30 years  . Smokeless tobacco: None  . Alcohol Use: No    Review of Systems  Respiratory: Positive for cough and shortness of breath.   Cardiovascular: Positive for chest pain.    Allergies  Review of patient's allergies indicates no known allergies.  Home Medications   Prior to Admission medications   Medication Sig Start Date End Date Taking? Authorizing Provider  furosemide (LASIX) 40 MG tablet Take 1 tablet (40 mg total) by mouth daily. 02/25/15  Yes Laurann Montana, PA-C  rivaroxaban (XARELTO) 20 MG TABS tablet Take 1 tablet (20 mg total) by mouth daily with supper. 02/25/15  Yes Dayna N Dunn, PA-C  spironolactone (ALDACTONE) 25 MG tablet Take 1 tablet (25 mg total) by mouth daily. 02/25/15  Yes Dayna N Dunn, PA-C  atorvastatin (LIPITOR) 80 MG tablet Take 1 tablet (80 mg total) by mouth every evening. Patient not taking: Reported on 10/13/2015 02/25/15   Dayna N Dunn, PA-C  Dexlansoprazole (DEXILANT) 30 MG capsule Take 1 capsule (30 mg total) by mouth daily. Patient not taking: Reported on 10/13/2015 12/30/14   Dessa Phi, MD  glipiZIDE (GLUCOTROL) 5 MG tablet Take 1 tablet (5 mg total) by mouth 2 (two) times daily before a meal. Patient not taking: Reported on 10/13/2015 03/06/15   Doris Cheadle, MD  hydrALAZINE (APRESOLINE) 50 MG  tablet Take 1.5 tablets (75 mg total) by mouth 3 (three) times daily. Patient not taking: Reported on 10/13/2015 02/25/15   Dayna N Dunn, PA-C  lisinopril (PRINIVIL,ZESTRIL) 40 MG tablet Take 1 tablet (40 mg total) by mouth daily. Patient not taking: Reported on 10/13/2015 03/06/15   Doris Cheadle, MD  metoprolol (LOPRESSOR) 100 MG tablet Take 1 tablet (100 mg total) by mouth 2 (two) times daily. Patient not taking: Reported on 10/13/2015 02/25/15   Dayna N Dunn, PA-C  nitroGLYCERIN (NITROSTAT) 0.4 MG SL tablet Place 1 tablet (0.4 mg total) under the tongue every 5 (five) minutes as needed for chest pain (up to 3 doses). 02/25/15   Dayna N Dunn, PA-C   BP 166/116 mmHg  Pulse 116  Temp(Src) 98.5 F (36.9 C) (Oral)  Resp 18  SpO2 95% Physical Exam  Constitutional: He is oriented to person, place, and time. He appears well-developed and well-nourished.  HENT:  Head: Normocephalic and atraumatic.  Eyes: EOM are normal.  Neck: Normal range of motion. No JVD present.  Cardiovascular: Regular rhythm, normal heart sounds and intact distal pulses.  Tachycardia present.   Pulmonary/Chest: Breath sounds normal. He is in respiratory distress. He has no wheezes. He has no rales.  CTAB  Abdominal: Soft. He exhibits no distension. There is tenderness in the epigastric area.  Musculoskeletal: Normal range of motion.  No unilateral LE swelling or pitting edema  Neurological: He is alert and oriented to person, place, and time.  Skin: Skin is warm and dry.  Psychiatric: He has a normal mood and affect. Judgment normal.  Nursing note and vitals reviewed.   ED Course  Procedures (including critical care time) DIAGNOSTIC STUDIES: Oxygen Saturation is 95% on RA,  normal by my interpretation.    COORDINATION OF CARE: 9:56 AM Discussed treatment plan which includes admission and tx for CAP and ACS with pt at bedside and pt agreed to plan.  Labs Review Labs Reviewed  BASIC METABOLIC PANEL - Abnormal;  Notable for the following:    Glucose, Bld 299 (*)    Creatinine, Ser 1.46 (*)    Calcium 8.8 (*)    GFR calc non Af Amer 54 (*)    All other components within normal limits  CBC - Abnormal; Notable for the following:    WBC 10.7 (*)    All other components within normal limits  PROTIME-INR - Abnormal; Notable for the following:    Prothrombin Time 16.4 (*)    All other components within normal limits  TROPONIN I - Abnormal; Notable for the following:    Troponin I 0.20 (*)    All other components within normal limits  HEPARIN  LEVEL (UNFRACTIONATED) - Abnormal; Notable for the following:    Heparin Unfractionated <0.10 (*)    All other components within normal limits  D-DIMER, QUANTITATIVE (NOT AT [facility]) - Abnormal; Notable for the following:    D-Dimer, Quant 0.83 (*)    All other components within normal limits  TROPONIN I - Abnormal; Notable for the following:    Troponin I 0.20 (*)    All other components within normal limits  LIPID PANEL - Abnormal; Notable for the following:    HDL 35 (*)    All other components within normal limits  I-STAT TROPOININ, ED - Abnormal; Notable for the following:    Troponin i, poc 0.22 (*)    All other components within normal limits  CBG MONITORING, ED - Abnormal; Notable for the following:    Glucose-Capillary 253 (*)    All other components within normal limits  CBG MONITORING, ED - Abnormal; Notable for the following:    Glucose-Capillary 136 (*)    All other components within normal limits  CBG MONITORING, ED - Abnormal; Notable for the following:    Glucose-Capillary 205 (*)    All other components within normal limits  CULTURE, BLOOD (ROUTINE X 2)  CULTURE, BLOOD (ROUTINE X 2)  CULTURE, EXPECTORATED SPUTUM-ASSESSMENT  GRAM STAIN  APTT  LACTIC ACID, PLASMA  HEPARIN LEVEL (UNFRACTIONATED)  HEMOGLOBIN A1C  TROPONIN I  TROPONIN I  HIV ANTIBODY (ROUTINE TESTING)    Imaging Review Dg Chest 2 View  10/13/2015  CLINICAL DATA:   Initial evaluation for acute chest pain for 1 month. EXAM: CHEST  2 VIEW COMPARISON:  Prior radiograph from 02/20/2015. FINDINGS: Cardiomegaly is stable from previous study. Mediastinal silhouette within normal limits. Lung volumes within normal limits. There is parenchymal opacity within the left lower lobe, suspicious for possible pneumonia. Additional patchy infiltrate within the right perihilar region, likely within the right upper and lower lobes. Find a concerning for possible infection. Mild scattered interstitial prominence without pulmonary edema. No pleural effusion. No pneumothorax. Osseous structures demonstrate no acute abnormality. Mild multilevel degenerative changes within the visualized spine. IMPRESSION: Patchy multi focal opacities within the left lower lobe and right perihilar region, suspicious for possible multi focal pneumonia. Electronically Signed   By: Rise Mu M.D.   On: 10/13/2015 03:49   I have personally reviewed and evaluated these images and lab results as part of my medical decision-making.   EKG Interpretation   Date/Time:  Monday October 13 2015 02:30:25 EST Ventricular Rate:  118 PR Interval:  118 QRS Duration: 92 QT Interval:  342 QTC Calculation: 479 R Axis:   49 Text Interpretation:  Sinus tachycardia Biatrial enlargement Anteroseptal  infarct , age undetermined Abnormal ECG No significant change since last  tracing - the t wave inversion in the inferior and lateral leads have  resolved Confirmed by Rhunette Croft, MD, Janey Genta [phone]) on 10/13/2015 2:47:05 AM      MDM   Final diagnoses:  CAP (community acquired pneumonia)  NSTEMI (non-ST elevated myocardial infarction) (HCC)  Multifocal lung consolidation (HCC)    I personally performed the services described in this documentation, which was scribed in my presence. The recorded information has been reviewed and is accurate.  Pt comes in with cc of chest pain, cough, dib. He has L sided  pressure like chest pain, pressure like. Also has cough, with dib and clear phlegm. Pt's chest pain and dib is worse with exertion. Pt is in resp distress during hx, as he is unable  to complete full sentences. + increased RR and HR as well, w/o fevers.  DDX; CAP, PE, ACS, CHF, Pleural effusion, Pulm edema, Acute anemia.  With hx of known CAD that is being medically managed, and exertional dyspnea, pressure like chest pain, ACS and CHF exacerbatioin are high on the ddx. CAP also possible with the phlegm and cough.   @4 :45 am CXR reportedly is showing possible multifocal CAP - no fevers, no elevated WC -  But with cough, resp distress, dyspnea - will cover for CAP. Trop is also elevated. Will start nitro drip and heparin. Cards has been consulted - although seems like he is going to need just medical management at this time. Medicine to admit as unassigned.  Nitro and heparin drip started  LATE ENTRY: Medicine team wants Cardiology input if possible, given known ischemic cardiomyopathy, active chest pain and elevated troponin. I informed Dr. Katrinka Blazing, that pt had a recent cath and that the obstructive disease is not amenable to therapy. Initially, he wanted pt to stay in the ER until Cards had seen the patient, but then he agreed to admit the patient, but still requests Cards eval. I have left a VM to Dr. York Spaniel cell phone. Admission orders placed. Oral metop ordered for elevated HR and BP. Antibiotics started, nitro gtt for pain started. Pt aware of the plan for admission.   CRITICAL CARE Performed by: Derwood Kaplan   Total critical care time: 42 minutes  Critical care time was exclusive of separately billable procedures and treating other patients.  Critical care was necessary to treat or prevent imminent or life-threatening deterioration.  Critical care was time spent personally by me on the following activities: development of treatment plan with patient and/or surrogate as well as  nursing, discussions with consultants, evaluation of patient's response to treatment, examination of patient, obtaining history from patient or surrogate, ordering and performing treatments and interventions, ordering and review of laboratory studies, ordering and review of radiographic studies, pulse oximetry and re-evaluation of patient's condition.   Derwood Kaplan, MD 10/13/15 1601  Derwood Kaplan, MD 10/13/15 0932  Derwood Kaplan, MD 10/13/15 3557

## 2015-10-14 ENCOUNTER — Inpatient Hospital Stay (HOSPITAL_COMMUNITY): Payer: Self-pay

## 2015-10-14 ENCOUNTER — Inpatient Hospital Stay (HOSPITAL_COMMUNITY): Payer: MEDICAID

## 2015-10-14 DIAGNOSIS — J189 Pneumonia, unspecified organism: Principal | ICD-10-CM

## 2015-10-14 DIAGNOSIS — J9601 Acute respiratory failure with hypoxia: Secondary | ICD-10-CM

## 2015-10-14 DIAGNOSIS — I5023 Acute on chronic systolic (congestive) heart failure: Secondary | ICD-10-CM

## 2015-10-14 DIAGNOSIS — J80 Acute respiratory distress syndrome: Secondary | ICD-10-CM

## 2015-10-14 DIAGNOSIS — I248 Other forms of acute ischemic heart disease: Secondary | ICD-10-CM | POA: Diagnosis present

## 2015-10-14 DIAGNOSIS — F1410 Cocaine abuse, uncomplicated: Secondary | ICD-10-CM

## 2015-10-14 LAB — RAPID URINE DRUG SCREEN, HOSP PERFORMED
AMPHETAMINES: NOT DETECTED
BARBITURATES: NOT DETECTED
Benzodiazepines: NOT DETECTED
COCAINE: POSITIVE — AB
OPIATES: POSITIVE — AB
TETRAHYDROCANNABINOL: NOT DETECTED

## 2015-10-14 LAB — EXPECTORATED SPUTUM ASSESSMENT W GRAM STAIN, RFLX TO RESP C

## 2015-10-14 LAB — BASIC METABOLIC PANEL
ANION GAP: 11 (ref 5–15)
BUN: 24 mg/dL — AB (ref 6–20)
CALCIUM: 9.1 mg/dL (ref 8.9–10.3)
CO2: 22 mmol/L (ref 22–32)
Chloride: 106 mmol/L (ref 101–111)
Creatinine, Ser: 1.31 mg/dL — ABNORMAL HIGH (ref 0.61–1.24)
GFR calc Af Amer: >60 mL/min (ref >60)
GLUCOSE: 118 mg/dL — AB (ref 65–99)
Potassium: 3.9 mmol/L (ref 3.5–5.1)
Sodium: 139 mmol/L (ref 135–145)

## 2015-10-14 LAB — INFLUENZA PANEL BY PCR (TYPE A & B)
H1N1 flu by pcr: NOT DETECTED
INFLBPCR: NEGATIVE
Influenza A By PCR: NEGATIVE

## 2015-10-14 LAB — CBC
HCT: 38.9 % — ABNORMAL LOW (ref 39.0–52.0)
Hemoglobin: 12.9 g/dL — ABNORMAL LOW (ref 13.0–17.0)
MCH: 28.4 pg (ref 26.0–34.0)
MCHC: 33.2 g/dL (ref 30.0–36.0)
MCV: 85.5 fL (ref 78.0–100.0)
PLATELETS: 210 10*3/uL (ref 150–400)
RBC: 4.55 MIL/uL (ref 4.22–5.81)
RDW: 13.8 % (ref 11.5–15.5)
WBC: 16.4 10*3/uL — ABNORMAL HIGH (ref 4.0–10.5)

## 2015-10-14 LAB — GLUCOSE, CAPILLARY
GLUCOSE-CAPILLARY: 111 mg/dL — AB (ref 65–99)
GLUCOSE-CAPILLARY: 98 mg/dL (ref 65–99)
GLUCOSE-CAPILLARY: 225 mg/dL — AB (ref 65–99)
Glucose-Capillary: 133 mg/dL — ABNORMAL HIGH (ref 65–99)
Glucose-Capillary: 359 mg/dL — ABNORMAL HIGH (ref 65–99)
Glucose-Capillary: 408 mg/dL — ABNORMAL HIGH (ref 65–99)
Glucose-Capillary: 113 mg/dL — ABNORMAL HIGH (ref 65–99)

## 2015-10-14 LAB — BLOOD GAS, ARTERIAL
ACID-BASE DEFICIT: 1.5 mmol/L (ref 0.0–2.0)
Acid-base deficit: 1 mmol/L (ref 0.0–2.0)
BICARBONATE: 23.1 meq/L (ref 20.0–24.0)
Bicarbonate: 22.7 mEq/L (ref 20.0–24.0)
Delivery systems: POSITIVE
Drawn by: 44166
Drawn by: 441661
EXPIRATORY PAP: 7
FIO2: 0.36
FIO2: 0.6
INSPIRATORY PAP: 16
O2 CONTENT: 4 L/min
O2 Saturation: 89.8 %
O2 Saturation: 98.9 %
PCO2 ART: 37.8 mmHg (ref 35.0–45.0)
PCO2 ART: 38.7 mmHg (ref 35.0–45.0)
PH ART: 7.388 (ref 7.350–7.450)
PO2 ART: 149 mmHg — AB (ref 80.0–100.0)
Patient temperature: 98.2
Patient temperature: 99.1
RATE: 12 resp/min
TCO2: 23.9 mmol/L (ref 0–100)
TCO2: 24.3 mmol/L (ref 0–100)
pH, Arterial: 7.403 (ref 7.350–7.450)
pO2, Arterial: 61.8 mmHg — ABNORMAL LOW (ref 80.0–100.0)

## 2015-10-14 LAB — PROCALCITONIN: PROCALCITONIN: 3.69 ng/mL

## 2015-10-14 LAB — EXPECTORATED SPUTUM ASSESSMENT W REFEX TO RESP CULTURE

## 2015-10-14 LAB — MRSA PCR SCREENING: MRSA BY PCR: NEGATIVE

## 2015-10-14 LAB — HEMOGLOBIN A1C
Hgb A1c MFr Bld: 9.6 % — ABNORMAL HIGH (ref 4.8–5.6)
Mean Plasma Glucose: 229 mg/dL

## 2015-10-14 LAB — STREP PNEUMONIAE URINARY ANTIGEN: STREP PNEUMO URINARY ANTIGEN: NEGATIVE

## 2015-10-14 LAB — BRAIN NATRIURETIC PEPTIDE: B Natriuretic Peptide: 1219.9 pg/mL — ABNORMAL HIGH (ref 0.0–100.0)

## 2015-10-14 LAB — TROPONIN I: Troponin I: 0.11 ng/mL — ABNORMAL HIGH (ref <0.031)

## 2015-10-14 MED ORDER — VANCOMYCIN HCL IN DEXTROSE 1-5 GM/200ML-% IV SOLN
1000.0000 mg | Freq: Two times a day (BID) | INTRAVENOUS | Status: DC
  Administered 2015-10-14 – 2015-10-15 (×3): 1000 mg via INTRAVENOUS
  Filled 2015-10-14 (×4): qty 200

## 2015-10-14 MED ORDER — PANTOPRAZOLE SODIUM 40 MG IV SOLR
40.0000 mg | INTRAVENOUS | Status: DC
  Administered 2015-10-15 – 2015-10-17 (×3): 40 mg via INTRAVENOUS
  Filled 2015-10-14 (×3): qty 40

## 2015-10-14 MED ORDER — INSULIN GLARGINE 100 UNIT/ML ~~LOC~~ SOLN: 10.0000 [IU] | Freq: Every day | SUBCUTANEOUS | Status: DC

## 2015-10-14 MED ORDER — FUROSEMIDE 10 MG/ML IJ SOLN
INTRAMUSCULAR | Status: AC
  Filled 2015-10-14: qty 4

## 2015-10-14 MED ORDER — INSULIN GLARGINE 100 UNIT/ML ~~LOC~~ SOLN
10.0000 [IU] | Freq: Every day | SUBCUTANEOUS | Status: DC
  Administered 2015-10-14 – 2015-10-16 (×3): 10 [IU] via SUBCUTANEOUS
  Filled 2015-10-14 (×4): qty 0.1

## 2015-10-14 MED ORDER — DEXTROSE 5 % IV SOLN
500.0000 mg | INTRAVENOUS | Status: DC
  Administered 2015-10-14 – 2015-10-16 (×3): 500 mg via INTRAVENOUS
  Filled 2015-10-14 (×5): qty 500

## 2015-10-14 MED ORDER — INSULIN ASPART 100 UNIT/ML ~~LOC~~ SOLN: 2.0000 [IU] | SUBCUTANEOUS | Status: DC

## 2015-10-14 MED ORDER — PIPERACILLIN-TAZOBACTAM 3.375 G IVPB
3.3750 g | Freq: Three times a day (TID) | INTRAVENOUS | Status: DC
  Administered 2015-10-14 – 2015-10-15 (×4): 3.375 g via INTRAVENOUS
  Filled 2015-10-14 (×6): qty 50

## 2015-10-14 MED ORDER — PIPERACILLIN-TAZOBACTAM 3.375 G IVPB 30 MIN
3.3750 g | INTRAVENOUS | Status: AC
  Administered 2015-10-14: 3.375 g via INTRAVENOUS
  Filled 2015-10-14: qty 50

## 2015-10-14 MED ORDER — FUROSEMIDE 10 MG/ML IJ SOLN
40.0000 mg | INTRAMUSCULAR | Status: AC
  Administered 2015-10-14: 40 mg via INTRAVENOUS

## 2015-10-14 MED ORDER — VANCOMYCIN HCL IN DEXTROSE 1-5 GM/200ML-% IV SOLN
1000.0000 mg | INTRAVENOUS | Status: AC
  Administered 2015-10-14: 1000 mg via INTRAVENOUS
  Filled 2015-10-14: qty 200

## 2015-10-14 MED ORDER — PERFLUTREN LIPID MICROSPHERE
1.0000 mL | INTRAVENOUS | Status: AC | PRN
  Administered 2015-10-14: 2 mL via INTRAVENOUS
  Filled 2015-10-14: qty 10

## 2015-10-14 MED ORDER — INSULIN ASPART 100 UNIT/ML ~~LOC~~ SOLN
0.0000 [IU] | Freq: Three times a day (TID) | SUBCUTANEOUS | Status: DC
  Administered 2015-10-14: 15 [IU] via SUBCUTANEOUS
  Administered 2015-10-15: 8 [IU] via SUBCUTANEOUS
  Administered 2015-10-15: 15 [IU] via SUBCUTANEOUS
  Administered 2015-10-15: 5 [IU] via SUBCUTANEOUS
  Administered 2015-10-16: 3 [IU] via SUBCUTANEOUS
  Administered 2015-10-16: 15 [IU] via SUBCUTANEOUS
  Administered 2015-10-16: 2 [IU] via SUBCUTANEOUS
  Administered 2015-10-17: 5 [IU] via SUBCUTANEOUS

## 2015-10-14 MED ORDER — SODIUM CHLORIDE 0.9 % IV SOLN
INTRAVENOUS | Status: DC
  Filled 2015-10-14: qty 2.5

## 2015-10-14 MED ORDER — LORAZEPAM 2 MG/ML IJ SOLN
0.5000 mg | Freq: Four times a day (QID) | INTRAMUSCULAR | Status: DC | PRN
  Administered 2015-10-14: 0.5 mg via INTRAVENOUS
  Filled 2015-10-14: qty 1

## 2015-10-14 MED ORDER — MORPHINE SULFATE (PF) 2 MG/ML IV SOLN
1.0000 mg | INTRAVENOUS | Status: DC | PRN
  Administered 2015-10-14 – 2015-10-16 (×3): 1 mg via INTRAVENOUS
  Filled 2015-10-14 (×3): qty 1

## 2015-10-14 MED ORDER — FUROSEMIDE 10 MG/ML IJ SOLN
40.0000 mg | Freq: Three times a day (TID) | INTRAMUSCULAR | Status: DC
  Administered 2015-10-14 – 2015-10-17 (×9): 40 mg via INTRAVENOUS
  Filled 2015-10-14 (×11): qty 4

## 2015-10-14 NOTE — Progress Notes (Signed)
Transported to 63M on Bipap with Rapid response RN and 3W RN without complication.

## 2015-10-14 NOTE — Progress Notes (Signed)
FiO2 titrated per ABG. RN aware

## 2015-10-14 NOTE — Progress Notes (Signed)
TELEMETRY: Reviewed telemetry pt in NSR: Filed Vitals:   10/14/15 1150 10/14/15 1200 10/14/15 1300 10/14/15 1400  BP: 135/101 140/103 138/101   Pulse: 89 87 91   Temp:      TempSrc:      Resp: 22 29 25 29   Height:      Weight:      SpO2: 96% 95% 97%     Intake/Output Summary (Last 24 hours) at 10/14/15 1506 Last data filed at 10/14/15 1400  Gross per 24 hour  Intake    500 ml  Output   1275 ml  Net   -775 ml   Filed Weights   10/13/15 0408 10/13/15 1230 10/14/15 0445  Weight: 86.2 kg (190 lb 0.6 oz) 74.2 kg (163 lb 9.3 oz) 73.71 kg (162 lb 8 oz)    Subjective Feels much better now. Was transferred to ICU for acute respiratory failure requiring BIPAP. Now on [place]. Reports coughing up blood last night. Now cough and breathing improved. Admits to recent cocaine use.   10/16/15 atorvastatin  80 mg Oral QPM  . azithromycin  500 mg Intravenous Q24H  . furosemide      . furosemide  40 mg Intravenous 3 times per day  . hydrALAZINE  75 mg Oral TID  . insulin aspart  2-6 Units Subcutaneous 6 times per day  . ipratropium-albuterol  3 mL Nebulization Q6H  . isosorbide mononitrate  30 mg Oral Daily  . pantoprazole (PROTONIX) IV  40 mg Intravenous Q24H  . piperacillin-tazobactam (ZOSYN)  IV  3.375 g Intravenous 3 times per day  . rivaroxaban  20 mg Oral Q supper  . spironolactone  25 mg Oral Daily  . vancomycin  1,000 mg Intravenous Q12H      LABS: Basic Metabolic Panel:  Recent Labs  Marland Kitchen 1337 10/13/15 2241 10/14/15 0830  NA  --  140 139  K  --  3.9 3.9  CL  --  106 106  CO2  --  26 22  GLUCOSE  --  128* 118*  BUN  --  20 24*  CREATININE  --  1.33* 1.31*  CALCIUM  --  8.9 9.1  MG 1.9  --   --    Liver Function Tests: No results for input(s): AST, ALT, ALKPHOS, BILITOT, PROT, ALBUMIN in the last 72 hours. No results for input(s): LIPASE, AMYLASE in the last 72 hours. CBC:  Recent Labs  10/13/15 0247 10/14/15 0518  WBC 10.7* 16.4*  HGB 13.2 12.9*  HCT 39.5  38.9*  MCV 85.9 85.5  PLT 196 210   Cardiac Enzymes:  Recent Labs  10/13/15 1337 10/13/15 2241 10/14/15 0518  TROPONINI 0.16* 0.16* 0.11*   BNP: No results for input(s): PROBNP in the last 72 hours. D-Dimer:  Recent Labs  10/13/15 0247 10/13/15 2241  DDIMER 0.83* 0.56*   Hemoglobin A1C:  Recent Labs  10/13/15 0700  HGBA1C 9.6*   Fasting Lipid Panel:  Recent Labs  10/13/15 0726  CHOL 128  HDL 35*  LDLCALC 80  TRIG 67  CHOLHDL 3.7   Thyroid Function Tests: No results for input(s): TSH, T4TOTAL, T3FREE, THYROIDAB in the last 72 hours.  Invalid input(s): FREET3   Radiology/Studies:  Dg Chest 2 View  10/13/2015  CLINICAL DATA:  Initial evaluation for acute chest pain for 1 month. EXAM: CHEST  2 VIEW COMPARISON:  Prior radiograph from 02/20/2015. FINDINGS: Cardiomegaly is stable from previous study. Mediastinal silhouette within normal limits. Lung volumes within normal limits.  There is parenchymal opacity within the left lower lobe, suspicious for possible pneumonia. Additional patchy infiltrate within the right perihilar region, likely within the right upper and lower lobes. Find a concerning for possible infection. Mild scattered interstitial prominence without pulmonary edema. No pleural effusion. No pneumothorax. Osseous structures demonstrate no acute abnormality. Mild multilevel degenerative changes within the visualized spine. IMPRESSION: Patchy multi focal opacities within the left lower lobe and right perihilar region, suspicious for possible multi focal pneumonia. Electronically Signed   By: Rise Mu M.D.   On: 10/13/2015 03:49   Dg Chest Port 1 View  10/13/2015  CLINICAL DATA:  Chest pain and shortness of breath for 1 day. Fever and cough. Pneumonia. EXAM: PORTABLE CHEST 1 VIEW COMPARISON:  Earlier today FINDINGS: Progression of airspace opacity diffusely, patchy and asymmetric to the left. There were kerley lines and fissural thickening on  prior chest x-ray. No effusion or air leak. Normal heart size and vascular pedicle width. IMPRESSION: Marked progression of bilateral pneumonia. Possible superimposed pulmonary edema. Electronically Signed   By: Marnee Spring M.D.   On: 10/13/2015 22:17   Echo: today Study Conclusions  - Left ventricle: The cavity size was normal. Wall thickness was increased in a pattern of mild LVH. Systolic function was moderately reduced. The estimated ejection fraction was in the range of 35% to 40%. Severe hypokinesis of the apicalanteroseptal and apical myocardium. - Mitral valve: There was mild regurgitation. - Left atrium: The atrium was mildly dilated. - Right ventricle: The cavity size was normal. Wall thickness was mildly increased.  PHYSICAL EXAM General: Well developed, AAM, in no acute distress. Head: Normocephalic, atraumatic, sclera non-icteric, oropharynx is clear Neck: Negative for carotid bruits. JVD not elevated. No adenopathy Lungs: Coarse BS in bases. Breathing is unlabored. Heart: RRR S1 S2 without murmurs, rubs, or gallops.  Abdomen: Soft, non-tender, non-distended with normoactive bowel sounds. No hepatomegaly. No rebound/guarding. No obvious abdominal masses. Msk:  Strength and tone appears normal for age. Extremities: No clubbing, cyanosis or edema.  Distal pedal pulses are 2+ and equal bilaterally. Neuro: Alert and oriented X 3. Moves all extremities spontaneously. Psych:  Responds to questions appropriately with a normal affect.  ASSESSMENT AND PLAN: 1. Acute respiratory failure- clinical presentation most consistent with PNA. He does have a component of acute on chronic systolic CHF and this may have led to his more acute decompensation. Improved with IV diuretics. EF 35-40% by Echo. Not much changed from April. Continue Aldactone, nitrates, hydralazine, and IV lasix. Not a candidate for beta blocker due to cocaine abuse.  2. Elevated troponin- flat trend. I  think this is secondary to demand ischemia with multiple stressors in setting of known CAD. Review of cath from April shows no suitable targets for PCI with chronic occlusion of LAD. 3. Cocaine abuse- counseled on cessation. 4. History of atrial fibrillation. On Xarelto. Currently in NSR 5. HTN 6. Acute on chronic systolic CHF as above. 7. Tobacco abuse  Present on Admission:  . Community acquired pneumonia . Essential hypertension . Hyperlipidemia . Smoking . CAD (coronary artery disease) . Elevated troponin . Chest pain . CAP (community acquired pneumonia)  Signed, Peter [place], MDFACC 10/14/2015 3:06 PM

## 2015-10-14 NOTE — Progress Notes (Signed)
eLink Physician-Brief Progress Note Patient Name: Sean Hinton DOB: [birth year] MRN: [id number]   Date of Service  10/14/2015  HPI/Events of Note  Looking good and walking to bathroom. Off bipap. Responded to lasix   Recent Labs Lab 10/13/15 0247 10/14/15 0518  HGB 13.2 12.9*  HCT 39.5 38.9*  WBC 10.7* 16.4*  PLT 196 210    Recent Labs Lab 10/13/15 0247 10/13/15 1337 10/13/15 2241 10/14/15 0830  NA 137  --  140 139  K 4.1  --  3.9 3.9  CL 101  --  106 106  CO2 27  --  26 22  GLUCOSE 299*  --  128* 118*  BUN 19  --  20 24*  CREATININE 1.46*  --  1.33* 1.31*  CALCIUM 8.8*  --  8.9 9.1  MG  --  1.9  --   --     Recent Labs Lab 10/13/15 0741 10/14/15 0830  LATICACIDVEN 1.1  --   PROCALCITON  --  3.69      eICU Interventions  Dc npo Start cab modified diet Move to tele 3W 10/15/15 7am and TRH primary      Intervention Category Intermediate Interventions: Communication with other healthcare providers and/or family  Kirrah Mustin 10/14/2015, 4:28 PM

## 2015-10-14 NOTE — Progress Notes (Signed)
Patient doing well on RA with no respiratory distress noted. SpO2 is 98% on room air and RR is 18 HR within normal limits. RT will continue to monitor.

## 2015-10-14 NOTE — Progress Notes (Signed)
  Echocardiogram 2D Echocardiogram has been performed.  Arvil Chaco 10/14/2015, 11:05 AM

## 2015-10-14 NOTE — Consult Note (Addendum)
PULMONARY / CRITICAL CARE MEDICINE   Name: Sean Hinton MRN: [id number] DOB: April 20, 1961    ADMISSION DATE:  10/13/2015 CONSULTATION DATE:  10/14/15  REFERRING MD:  DR Hongalgi  CHIEF COMPLAINT:  Acute hypoxemic resp failure with infiltrates  HISTORY OF PRESENT ILLNESS:   History is obtained from speaking to the patient's biological half-brother, patient and review of the chart and referring hospitalist  54 year old male who uses cocaine, past medical history of hypertension, diabetes, atrial fibrillation on Xarelto with suspected high degree of noncompliance, cardiomyopathy ischemic with ejection fraction of 45% in April 2016 and coronary artery disease not otherwise specified. 3 weeks prior to admission started having cough and subjective viral symptoms associated with white sputum. Unclear when last dose of cocaine was. Denies any active fever or colored sputum. At time of admission had left-sided chest pain. Upon arrival to the emergency department chest x-ray showed pulmonary infiltrates and he was a little bit hypoxemic requiring nasal cannula oxygen. Diagnosis was community by pneumonia and he was started on vancomycin and Zosyn. He also had demand ischemia elevation in troponins. On 10/14/2015 he had worsening pulmonary infiltrates and was started on BiPAP. Pulmonary critical care medicine consulted and patient being transferred to the intensive care unit. No obvious fevers during the hospitalization. His HIV is negative. His on and off fiance 1 year is listed as this has Power of attorney  Events 10/13/2015 - admit 10/14/15 - BiPAP , worse cXR - move to ICU  PAST MEDICAL HISTORY :  He  has a past medical history of Diabetes mellitus with complication (HCC); Hypertension; Syncope; Atrial fibrillation and flutter (HCC); Ischemic cardiomyopathy; CAD (coronary artery disease); Hypertensive heart disease; Hyperlipidemia; Mitral regurgitation; and Tobacco abuse.  PAST SURGICAL  HISTORY: He  has past surgical history that includes right thumb surgery ; Cardiac catheterization (N/A, 02/24/2015); and Cardiac catheterization (02/24/2015).  No Known Allergies  No current facility-administered medications on file prior to encounter.   Current Outpatient Prescriptions on File Prior to Encounter  Medication Sig  . furosemide (LASIX) 40 MG tablet Take 1 tablet (40 mg total) by mouth daily.  . rivaroxaban (XARELTO) 20 MG TABS tablet Take 1 tablet (20 mg total) by mouth daily with supper.  Marland Kitchen spironolactone (ALDACTONE) 25 MG tablet Take 1 tablet (25 mg total) by mouth daily.  Marland Kitchen atorvastatin (LIPITOR) 80 MG tablet Take 1 tablet (80 mg total) by mouth every evening. (Patient not taking: Reported on 10/13/2015)  . Dexlansoprazole (DEXILANT) 30 MG capsule Take 1 capsule (30 mg total) by mouth daily. (Patient not taking: Reported on 10/13/2015)  . glipiZIDE (GLUCOTROL) 5 MG tablet Take 1 tablet (5 mg total) by mouth 2 (two) times daily before a meal. (Patient not taking: Reported on 10/13/2015)  . hydrALAZINE (APRESOLINE) 50 MG tablet Take 1.5 tablets (75 mg total) by mouth 3 (three) times daily. (Patient not taking: Reported on 10/13/2015)  . lisinopril (PRINIVIL,ZESTRIL) 40 MG tablet Take 1 tablet (40 mg total) by mouth daily. (Patient not taking: Reported on 10/13/2015)  . metoprolol (LOPRESSOR) 100 MG tablet Take 1 tablet (100 mg total) by mouth 2 (two) times daily. (Patient not taking: Reported on 10/13/2015)  . nitroGLYCERIN (NITROSTAT) 0.4 MG SL tablet Place 1 tablet (0.4 mg total) under the tongue every 5 (five) minutes as needed for chest pain (up to 3 doses).    FAMILY HISTORY:  His indicated that his mother is alive. He indicated that his father is deceased.   SOCIAL HISTORY: He  reports that  he has been smoking.  He does not have any smokeless tobacco history on file. He reports that he does not drink alcohol or use illicit drugs.  REVIEW OF SYSTEMS:   According to  history of present illness   VITAL SIGNS: BP 154/89 mmHg  Pulse 104  Temp(Src) 98.1 F (36.7 C) (Oral)  Resp 29  Ht 5\' 10"  (1.778 m)  Wt 73.71 kg (162 lb 8 oz)  BMI 23.32 kg/m2  SpO2 100%  HEMODYNAMICS:    VENTILATOR SETTINGS: Vent Mode:  [-] BIPAP;PCV FiO2 (%):  [50 %-60 %] 50 % Set Rate:  [12 bmp] 12 bmp PEEP:  [7 cmH20] 7 cmH20  INTAKE / OUTPUT: I/O last 3 completed shifts: In: 320 [P.O.:320] Out: 400 [Urine:400]  PHYSICAL EXAMINATION: General:  Lean muscle mass African-American male on BiPAP Neuro:  Alert and oriented 3. Speech is normal. Following commands. Moves all fours HEENT:  BiPAP mask is on. No elevated JVP Cardiovascular:  Irregularly irregular no murmurs Lungs:  No increased work of breathing other than mild tachypnea. No obvious crackles or wheezes Abdomen:  Soft, nontender no organomegaly Musculoskeletal:  No cyanosis no clubbing no edema Skin:  Appears intact on exposed areas  LABS:  PULMONARY  Recent Labs Lab 10/14/15 0020 10/14/15 0322  PHART 7.388 7.403  PCO2ART 38.7 37.8  PO2ART 61.8* 149*  HCO3 22.7 23.1  TCO2 23.9 24.3  O2SAT 89.8 98.9    CBC  Recent Labs Lab 10/13/15 0247 10/14/15 0518  HGB 13.2 12.9*  HCT 39.5 38.9*  WBC 10.7* 16.4*  PLT 196 210    COAGULATION  Recent Labs Lab 10/13/15 0247  INR 1.31    CARDIAC   Recent Labs Lab 10/13/15 0700 10/13/15 1015 10/13/15 1337 10/13/15 2241 10/14/15 0518  TROPONINI 0.20* 0.19* 0.16* 0.16* 0.11*   No results for input(s): PROBNP in the last 168 hours.   CHEMISTRY  Recent Labs Lab 10/13/15 0247 10/13/15 1337 10/13/15 2241 10/14/15 0830  NA 137  --  140 139  K 4.1  --  3.9 3.9  CL 101  --  106 106  CO2 27  --  26 22  GLUCOSE 299*  --  128* 118*  BUN 19  --  20 24*  CREATININE 1.46*  --  1.33* 1.31*  CALCIUM 8.8*  --  8.9 9.1  MG  --  1.9  --   --    Estimated Creatinine Clearance: 68.1 mL/min (by C-G formula based on Cr of  1.31).   LIVER  Recent Labs Lab 10/13/15 0247  INR 1.31     INFECTIOUS  Recent Labs Lab 10/13/15 0741  LATICACIDVEN 1.1     ENDOCRINE CBG (last 3)   Recent Labs  10/13/15 1623 10/13/15 2114 10/14/15 0721  GLUCAP 191* 212* 133*         IMAGING x48h  - image(s) personally visualized  -   highlighted in bold Dg Chest 2 View  10/13/2015  CLINICAL DATA:  Initial evaluation for acute chest pain for 1 month. EXAM: CHEST  2 VIEW COMPARISON:  Prior radiograph from 02/20/2015. FINDINGS: Cardiomegaly is stable from previous study. Mediastinal silhouette within normal limits. Lung volumes within normal limits. There is parenchymal opacity within the left lower lobe, suspicious for possible pneumonia. Additional patchy infiltrate within the right perihilar region, likely within the right upper and lower lobes. Find a concerning for possible infection. Mild scattered interstitial prominence without pulmonary edema. No pleural effusion. No pneumothorax. Osseous structures demonstrate no acute  abnormality. Mild multilevel degenerative changes within the visualized spine. IMPRESSION: Patchy multi focal opacities within the left lower lobe and right perihilar region, suspicious for possible multi focal pneumonia. Electronically Signed   By: Rise Mu M.D.   On: 10/13/2015 03:49   Dg Chest Port 1 View  10/13/2015  CLINICAL DATA:  Chest pain and shortness of breath for 1 day. Fever and cough. Pneumonia. EXAM: PORTABLE CHEST 1 VIEW COMPARISON:  Earlier today FINDINGS: Progression of airspace opacity diffusely, patchy and asymmetric to the left. There were kerley lines and fissural thickening on prior chest x-ray. No effusion or air leak. Normal heart size and vascular pedicle width. IMPRESSION: Marked progression of bilateral pneumonia. Possible superimposed pulmonary edema. Electronically Signed   By: Marnee Spring M.D.   On: 10/13/2015 22:17       DISCUSSION: 54 year old  male with nonischemic cardio myopathy, atrial fibrillation, cocaine abuse presenting with viral acute lung injury or community-acquired pneumonia with or without acute on chronic systolic heart failure possibly made worse by cocaine intake given chest pain at admission. Currently on BiPAP and responding. Needs broad antibiotics and aggressive Lasix  ASSESSMENT / PLAN:  PULMONARY A: Acute hypoxemic respiratory failure secondary to pulmonary infiltrates - differential diagnosis of viral acute lung injury versus bacterial community by pneumonia with or without acute on chronic systolic heart failure made worse by cocaine intake given chest pain at admission  P:   BiPAP Bronchodilators Diurese  CARDIOVASCULAR A:  #Baseline - Atrial fibrillation on Xarelto with questionable compliance - Chronic systolic heart failure with ejection fraction 45% and coronary artery disease - Cocaine abuse - chronic hypertension  #Current - Likely acute on chronic systolic heart failure - Demand ischemia and troponins   P:  Diurese aggressively Echo For now continuie hydralazine and imdur Avoid beta blocker due to cocaine history Continue xarelto  RENAL A:   Mild acute renal insufficiency improving P:   Monitor with diuresis  GASTROINTESTINAL A:   Respiratory distress on BiPAP P:   Nothing by mouth except meds Protonix  HEMATOLOGIC A:   At risk for anemia of critical illness P:  Transfuse per protocol  INFECTIOUS A:   Bacterial versus viral community acquired pneumonia - HIV negative P:   Vancomycin 10/13/2015 > Zosyn 10/13/2015 > Azithromycin for atypical coverage 10/14/2015 >  Check respiratory virus panel 10/14/2015 Check urine Legionella Check urine streptococcus Check urine drug screen  ENDOCRINE A:   At risk for hyperglycemia   P:   IC hyperglycemia protocol  NEUROLOGIC A:   At risk for acute encephalopathy P:   Monitor closely RASS goal: 0    FAMILY  -  Updates: Biological half-brother updated at the bedside at [address] in an interdisciplinary fashion with bedside nurse Ray present. Patient is listed healthcare power of attorney is his fiance with whom he has an on and off relationship. Brother will talk to the fiance for that collectively they can have one healthcare spokesperson. Social work consult placed  - Inter-disciplinary family meet or Palliative Care meeting due by: 10/21/2015 but initial interdisciplinary update Already Took [address] December 20th 2016    The patient is critically ill with multiple organ systems failure and requires high complexity decision making for assessment and support, frequent evaluation and titration of therapies, application of advanced monitoring technologies and extensive interpretation of multiple databases.   Critical Care Time devoted to patient care services described in this note is  45  Minutes. This time reflects time of  care of this signee Dr Kalman Shan. This critical care time does not reflect procedure time, or teaching time or supervisory time of PA/NP/Med student/Med Resident etc but could involve care discussion time    Dr. Kalman Shan, M.D., [facility].C.P Pulmonary and Critical Care Medicine Staff Physician  System [facility] Pager: [phone], If no answer or between  15:00h - 7:00h: call [phone]  10/14/2015 10:17 AM

## 2015-10-14 NOTE — Plan of Care (Signed)
Problem: Pain Managment: Goal: General experience of comfort will improve Outcome: Progressing Explained the pain scale to the pt and explained what we can do to control his pain while in hospital

## 2015-10-14 NOTE — Progress Notes (Signed)
BiPAP initiated tolerates it fairly well

## 2015-10-14 NOTE — Progress Notes (Signed)
ANTIBIOTIC CONSULT NOTE - INITIAL  Pharmacy Consult for Vancomycin and Zosyn Indication: PNA  No Known Allergies  Patient Measurements: Height: 5\' 10"  (177.8 cm) Weight: 163 lb 9.3 oz (74.2 kg) IBW/kg (Calculated) : 73  Vital Signs: Temp: 99.1 F (37.3 C) (12/19 2019) Temp Source: Oral (12/19 2019) BP: 154/89 mmHg (12/20 0003) Pulse Rate: 104 (12/19 2019) Intake/Output from previous day: 12/19 0701 - 12/20 0700 In: 320 [P.O.:320] Out: 200 [Urine:200] Intake/Output from this shift:    Labs:  Recent Labs  10/13/15 0247 10/13/15 2241  WBC 10.7*  --   HGB 13.2  --   PLT 196  --   CREATININE 1.46* 1.33*   Estimated Creatinine Clearance: 67.1 mL/min (by C-G formula based on Cr of 1.33). No results for input(s): VANCOTROUGH, VANCOPEAK, VANCORANDOM, GENTTROUGH, GENTPEAK, GENTRANDOM, TOBRATROUGH, TOBRAPEAK, TOBRARND, AMIKACINPEAK, AMIKACINTROU, AMIKACIN in the last 72 hours.   Microbiology: Recent Results (from the past 720 hour(s))  MRSA PCR Screening     Status: None   Collection Time: 10/13/15  1:12 PM  Result Value Ref Range Status   MRSA by PCR NEGATIVE NEGATIVE Final    Comment:        The GeneXpert MRSA Assay (FDA approved for NASAL specimens only), is one component of a comprehensive MRSA colonization surveillance program. It is not intended to diagnose MRSA infection nor to guide or monitor treatment for MRSA infections.     Medical History: Past Medical History  Diagnosis Date  . Diabetes mellitus with complication (HCC)   . Hypertension   . Syncope     a. 02/2015 - felt 2/2 rapid AF/AFL.  03/2015 Atrial fibrillation and flutter (HCC)     a. Dx 02/2015 - placed on Xarelto.  . Ischemic cardiomyopathy   . CAD (coronary artery disease)     a. Dx 02/2015 - 2V CAD with CTO LAD, diffuse dz in PDA, OM - med rx.  . Hypertensive heart disease   . Hyperlipidemia   . Mitral regurgitation     a. Mod by echo 02/2015,  . Tobacco abuse     Medications:   Scheduled:  . atorvastatin  80 mg Oral QPM  . guaiFENesin  600 mg Oral BID  . hydrALAZINE  75 mg Oral TID  . insulin aspart  0-15 Units Subcutaneous TID WC  . insulin aspart  0-5 Units Subcutaneous QHS  . insulin glargine  10 Units Subcutaneous Daily  . ipratropium-albuterol  3 mL Nebulization Q6H  . isosorbide mononitrate  30 mg Oral Daily  . metoprolol  100 mg Oral BID  . nitroGLYCERIN  1 inch Topical 4 times per day  . pantoprazole  40 mg Oral Daily  . rivaroxaban  20 mg Oral Q supper  . spironolactone  25 mg Oral Daily   Assessment: 54 y.o. M known to pharmacy from anticoagulation dosing this admission. Admitted with CAP - started on Rocephin and Doxycycline. Now to broaden abx to Vanc and Zosyn due to worsening PNA on CXR. Afeb. WBC slightly elevated to 10.7. SCr 1.33, est CrCl 67 ml/min.   Goal of Therapy:  Vancomycin trough level 15-20 mcg/ml  Plan:  Zosyn 3.375gm IV now over 30 min then 3.375gm IV q8h - subsequent doses over 4 hours Vancomycin 1gm IV q12 Will f/u micro data, renal function, and pt's clinical condition Vanc trough prn  44, PharmD, BCPS Clinical pharmacist, pager [phone] 10/14/2015,12:26 AM

## 2015-10-14 NOTE — Progress Notes (Signed)
abg collected  

## 2015-10-14 NOTE — Progress Notes (Signed)
Upon entering pt.'s room pt. Was having high amounts of anxiety & was trying to remove himself from BIPAP. Pt. Broke the mask (New mask was placed on BIPAP) Pt. Was taken off BIPAP per pt. Request & placed on a 3L [place]. Pt. Is tolerating [place] well at this time & isn't in any distress.

## 2015-10-14 NOTE — Progress Notes (Signed)
PROGRESS NOTE    Sean Hinton GGE:[id number] DOB: 1961-03-29 DOA: 10/13/2015 PCP: Lora Paula, MD  HPI/Brief narrative 54 year old male patient with history of CAD, ischemic CM (LVEF 40-45 percent in April 2016, chronic systolic CHF, PAF on xarelto, HTN, HLD, tobacco abuse, DM 2 presented to [facility] ED on 10/13/15 for precordial pressure type of chest pain-worse with activity, followed several days later by productive cough and dyspnea. In the ED, chest x-ray showed multiple patchy opacities suggestive of pneumonia, tachypnea, tachycardia, glucose 299 and troponin of 0.22. Cardiology consulted. Initially managed on stepdown unit. Overnight 12/19, anxiety/agitation, worsening dyspnea, chest pain and chest x-ray showed worsening infiltrates. Placed on BiPAP. CCM consulted and transferred to ICU under their care on 12/20.   Assessment/Plan:  Acute hypoxic respiratory failure - Secondary to pulmonary infiltrates. DD: Community-acquired pneumonia, viral acute lung injury, acute on chronic systolic CHF worsened by cocaine abuse - Patient developed worsening dyspnea, hypoxia and infiltrates on chest x-ray overnight 12/19. Required BiPAP. - CCM evaluated and transferred patient to ICU under their care on 12/20. NPO in case he were to require intubation. -CTA chest ordered-defer further workup to CCM.   Community acquired pneumonia-bacterial versus viral - Started empirically on IV Rocephin and doxycycline. Continue same. - will need follow-up chest x-ray in 3-4 weeks to ensure resolution of pneumonia findings - Lactate: Normal, blood cultures 2: Pending, HIV antibody: Nonreactive & sputum culture yet to be sent. - Tobacco cessation counseled. - Urine Legionella antigen: Pending. Urinary pneumococcal antigen: Negative - RSV & influenza panel: Need to be sent.  Atypical chest pain/elevated troponin - Cardiology input appreciated: Feel that he has atypical chest pain and his history is more  consistent with musculoskeletal pain related to cough. They recommend continuing home dose of metoprolol, resume Xarelto, DC IV Heparin, start nitrates. No further ischemic workup planned. Dr. [place] has reviewed prior Studies: CTO of LAD is not ideal for CTO PCI. - Elevated troponin likely related to demand ischemia from pneumonia in patient with known CAD.  Paroxysmal atrial fibrillation - Currently in sinus rhythm - mild ST in 100's.  - Patient adamantly claims compliance to Xarelto however history not reliable.  - Continue xarelto. Patient also denies substance abuse/cocaine abuse but per CCM-history of cocaine abuse (? Family reported). Avoiding beta blockers now.   Acute on chronic systolic CHF - IV Lasix. Continue hydralazine and Imdur.  Essential hypertension - Uncontrolled. Continue hydralazine and metoprolol and follow closely.  Hyperlipidemia - Statins.  Type II DM - Follow A1c: 9.6. SSI. Improved control.  Tobacco abuse - Cessation counseled.  CAD - Evaluation and management as indicated above.  NSVT - As per ED nursing, patient had a 10 beat asymptomatic NSVT. Monitor telemetry. Continue Metoprolol. K 4.1. Check Mg  Acute kidney injury - Improving.  Substance abuse/cocaine - will need cessation counseling. - UDS positive for opiates and cocaine   DVT prophylaxis: Patient will be on full dose Xarelto AC Code Status: Full Family Communication: None at bedside Disposition Plan: transfer to CCM service to ICU on 12/20. TRH will sign off. Please consulted for further assistance.   Consultants:  Cardiology   CCM  Procedures:  BiPAP   Antibiotics:  IV Rocephin 12/18 > discontinued   Doxycycline 2/18 >Discontinued  IV Vanc>  IV Zosyn>  IV Azithromycin>   Subjective: Overnight events noted. Anxiety/agitation, worsening dyspnea, hypoxia and intermittent chest pain. Placed on BiPAP. States that he feels better but still appears tachypneic.    Objective: Filed  Vitals:   10/14/15 1000 10/14/15 1005 10/14/15 1010 10/14/15 1150  BP: 145/87   135/101  Pulse: 99  105 89  Temp:  97.3 F (36.3 C)    TempSrc:  Oral    Resp:   24 22  Height:      Weight:      SpO2:   100% 96%    Intake/Output Summary (Last 24 hours) at 10/14/15 1156 Last data filed at 10/14/15 1045  Gross per 24 hour  Intake    320 ml  Output   1175 ml  Net   -855 ml   Filed Weights   10/13/15 0408 10/13/15 1230 10/14/15 0445  Weight: 86.2 kg (190 lb 0.6 oz) 74.2 kg (163 lb 9.3 oz) 73.71 kg (162 lb 8 oz)   temperature 98.71F.   Exam:  General exam: pleasant middle-aged male lying propped up in bed, on BiPAP, slightly restless, tachypneic. Respiratory system: reduced breath sounds bilaterally/harsh breath sounds. Mild  increased work of breathing. Cardiovascular system: S1 & S2 heard, RRR. No JVD, murmurs, gallops, clicks or pedal edema. telemetry: Sinus rhythm - ST.  Gastrointestinal system: Abdomen is nondistended, soft and nontender. Normal bowel sounds heard. Central nervous system: Alert and oriented. No focal neurological deficits. Extremities: Symmetric 5 x 5 power.   Data Reviewed: Basic Metabolic Panel:  Recent Labs Lab 10/13/15 0247 10/13/15 1337 10/13/15 2241 10/14/15 0830  NA 137  --  140 139  K 4.1  --  3.9 3.9  CL 101  --  106 106  CO2 27  --  26 22  GLUCOSE 299*  --  128* 118*  BUN 19  --  20 24*  CREATININE 1.46*  --  1.33* 1.31*  CALCIUM 8.8*  --  8.9 9.1  MG  --  1.9  --   --    Liver Function Tests: No results for input(s): AST, ALT, ALKPHOS, BILITOT, PROT, ALBUMIN in the last 168 hours. No results for input(s): LIPASE, AMYLASE in the last 168 hours. No results for input(s): AMMONIA in the last 168 hours. CBC:  Recent Labs Lab 10/13/15 0247 10/14/15 0518  WBC 10.7* 16.4*  HGB 13.2 12.9*  HCT 39.5 38.9*  MCV 85.9 85.5  PLT 196 210   Cardiac Enzymes:  Recent Labs Lab 10/13/15 0700 10/13/15 1015  10/13/15 1337 10/13/15 2241 10/14/15 0518  TROPONINI 0.20* 0.19* 0.16* 0.16* 0.11*   BNP (last 3 results) No results for input(s): PROBNP in the last 8760 hours. CBG:  Recent Labs Lab 10/13/15 1623 10/13/15 2114 10/14/15 0721 10/14/15 1015 10/14/15 1117  GLUCAP 191* 212* 133* 111* 98    Recent Results (from the past 240 hour(s))  MRSA PCR Screening     Status: None   Collection Time: 10/13/15  1:12 PM  Result Value Ref Range Status   MRSA by PCR NEGATIVE NEGATIVE Final    Comment:        The GeneXpert MRSA Assay (FDA approved for NASAL specimens only), is one component of a comprehensive MRSA colonization surveillance program. It is not intended to diagnose MRSA infection nor to guide or monitor treatment for MRSA infections.   Culture, sputum-assessment     Status: None   Collection Time: 10/14/15 12:43 AM  Result Value Ref Range Status   Specimen Description EXPECTORATED SPUTUM  Final   Special Requests NONE  Final   Sputum evaluation   Final    THIS SPECIMEN IS ACCEPTABLE. RESPIRATORY CULTURE REPORT TO FOLLOW.   Report  Status 10/14/2015 FINAL  Final           Studies: Dg Chest 2 View  10/13/2015  CLINICAL DATA:  Initial evaluation for acute chest pain for 1 month. EXAM: CHEST  2 VIEW COMPARISON:  Prior radiograph from 02/20/2015. FINDINGS: Cardiomegaly is stable from previous study. Mediastinal silhouette within normal limits. Lung volumes within normal limits. There is parenchymal opacity within the left lower lobe, suspicious for possible pneumonia. Additional patchy infiltrate within the right perihilar region, likely within the right upper and lower lobes. Find a concerning for possible infection. Mild scattered interstitial prominence without pulmonary edema. No pleural effusion. No pneumothorax. Osseous structures demonstrate no acute abnormality. Mild multilevel degenerative changes within the visualized spine. IMPRESSION: Patchy multi focal opacities  within the left lower lobe and right perihilar region, suspicious for possible multi focal pneumonia. Electronically Signed   By: Rise Mu M.D.   On: 10/13/2015 03:49   Dg Chest Port 1 View  10/13/2015  CLINICAL DATA:  Chest pain and shortness of breath for 1 day. Fever and cough. Pneumonia. EXAM: PORTABLE CHEST 1 VIEW COMPARISON:  Earlier today FINDINGS: Progression of airspace opacity diffusely, patchy and asymmetric to the left. There were kerley lines and fissural thickening on prior chest x-ray. No effusion or air leak. Normal heart size and vascular pedicle width. IMPRESSION: Marked progression of bilateral pneumonia. Possible superimposed pulmonary edema. Electronically Signed   By: Marnee Spring M.D.   On: 10/13/2015 22:17        Scheduled Meds: . atorvastatin  80 mg Oral QPM  . azithromycin  500 mg Intravenous Q24H  . furosemide      . furosemide  40 mg Intravenous 3 times per day  . hydrALAZINE  75 mg Oral TID  . insulin aspart  2-6 Units Subcutaneous 6 times per day  . ipratropium-albuterol  3 mL Nebulization Q6H  . isosorbide mononitrate  30 mg Oral Daily  . metoprolol  100 mg Oral BID  . nitroGLYCERIN  1 inch Topical 4 times per day  . pantoprazole (PROTONIX) IV  40 mg Intravenous Q24H  . piperacillin-tazobactam (ZOSYN)  IV  3.375 g Intravenous 3 times per day  . rivaroxaban  20 mg Oral Q supper  . spironolactone  25 mg Oral Daily  . vancomycin  1,000 mg Intravenous Q12H   Continuous Infusions:    Principal Problem:   Community acquired pneumonia Active Problems:   Essential hypertension   Hyperlipidemia   Smoking   Diabetes mellitus without complication (HCC)   CAD (coronary artery disease)   Elevated troponin   Cough   Chest pain   CAP (community acquired pneumonia)   Acute respiratory failure with hypoxia (HCC)    Time spent: 45 minutes.    Marcellus Scott, MD, FACP, FHM. [facility] Pager [phone]  If 7PM-7AM, please contact  night-coverage [email or web address] Password [email or web address] 10/14/2015, 11:56 AM    LOS: 1 day

## 2015-10-14 NOTE — Progress Notes (Addendum)
Shift event: Early in shift, this NP paged by RN asking for anti anxiety medication for pt. RN called and NP asked if pt was hypoxic given his anxiety and PNA. Pt's O2 sat normal on 2L which he has been on since admit. Low dose Ativan given. Per RN, pt also acutely c/o chest pain, 10/10, lowered to a 7/10 with NTG x 2. Morphine given. 12 lead done.  NP to bedside. S: endorses 7/10 chest pain. CP central sternum and sometimes in thoracic area. No radiation to jaw or arm. Still coughing some. RN says sputum is blood tinged. Endorses SOB but states it is "the same" since admission. Says he has to lean forward to breathe.  O: Acutely ill appearing AAM in no acute distress, but appears poorly. He is leaning forward. Sleepy but coherent, appropriate. Oriented. VS reviewed-O2 sat normal on 2L per [place]. HR tachycardic. Mild tachypnea. BP 140s. Card: RRR. Lungs: very poor air exchange. No rhonchi, rales, crackles. Neuro-no focal deficit. Msk: chest pain is not reproducible on palpation. A/P: 1. CP-pt was on NTG drip, now off and placed on Imdur by cardio. Given continued CP and elevated BP, nitro paste started. Cardio has been following for atypical/typical CP since admit. He was placed on Heparin drip which was d/c'd and Xeralto started back. Trops have been slightly elevated since admit and redo troponin tonight is .16 which is consistent with prior troponins. DDimer elevated on admit and r/p DDimer elevated as well. Given sx of tachycardia, tachypnea, and anxiety, will check CTA to r/o PE.  2. PNA-CAP. R/p CXR shows worsening multi-lobar. PNA. Change abx to Vanc Zosyn. Pt had blood cultures on admit. Get sputum cx. HIV was negative.  3. Tachypnea with SOB-get ABG.  4. AKI-creat is improving.  Low threshold for SDU and PCCM consult. Will follow. Jimmye Norman, NP [facility] Update: ABG normal except for low PO2 at 61. Given low PO2 with increased WOB, start Bipap. Recheck ABG 2 hours after bipap  starts. Sputum culture/gm stain ordered. Attempted to call PCCM.   KJKG, NP [facility] Update: Went back to bedside. Pt looks comfortable and is tolerating bipap well. Sputum cx was sent. Did speak to PCCM about pt and will call back if next ABG is not improved. Changed pt's level of care to step down.  KJKG, NP [facility] Update: ABG improved. FiO2 adjusted from 60 to 50% due to high PO2. Will continue bipap tonight. Perhaps, he can wean in am. Trop due at 0500.  KJKG, NP [facility] Per RN, family member called and reported that pt had been using drugs, last known on Saturday. Get UDS. KJKG, NP [facility] Update: Troponin down at .11.  KJKG, NP

## 2015-10-14 NOTE — Clinical Documentation Improvement (Signed)
Hospitalist  Can the diagnosis of MI be further specified?   Document type of MI - STEMI or NSTEMI  NSTEMI - requires episode of care reporting only  For NSTEMI - document date of any recent acute MIs and whether or not the current MI has occurred within 28 days of that timeframe.  Document if the patient has a history of an MI (older than 28 days)  Other  Clinically Undetermined  Document any associated diagnoses/conditions.   Supporting Information: NSTEMI noted per 12/19 ED notes. Suspect NSTEMI, type 2 per 12/19 progress notes.  Troponin I: 12/19:  0.16;  0.19;  0.20;  0.20. 12/10:  0.11.   Please exercise your independent, professional judgment when responding. A specific answer is not anticipated or expected.   Thank Sabino Donovan Health Information Management [place] [phone]

## 2015-10-14 NOTE — Significant Event (Signed)
Rapid Response Event Note  Overview: Time Called: 0020 Arrival Time: 0022 Event Type: Respiratory  Initial Focused Assessment: Respiratory distress    Interventions: Recommended bipap   Event Summary: Called to assist with care of patient in respiratory distress. Patient is lethargic but responsive. Using accessory muscles to breath. Productive cough with blood tinge sputum noted. O2 sats on 4 liters 94%. Skin is warm and dry. Heart sounds regular. Due to level of distress a bipap was recommended. Abg results noted. Patient may need ICU care if condition continues to deteriorate. On-call provider was made aware of evaluation.     at          [place]

## 2015-10-14 NOTE — Progress Notes (Signed)
PRN neb given, rapid RN in room at bedside.

## 2015-10-14 NOTE — Progress Notes (Signed)
Utilization Review Completed.Sean Hinton T12/20/2016

## 2015-10-14 NOTE — Plan of Care (Signed)
Problem: Health Behavior/Discharge Planning: Goal: Ability to manage health-related needs will improve Outcome: Progressing Pt girlfriend bringing advance directive paperwork. She is his HPOA.

## 2015-10-15 ENCOUNTER — Inpatient Hospital Stay (HOSPITAL_COMMUNITY): Payer: Self-pay

## 2015-10-15 DIAGNOSIS — I248 Other forms of acute ischemic heart disease: Secondary | ICD-10-CM

## 2015-10-15 LAB — BASIC METABOLIC PANEL
Anion gap: 9 (ref 5–15)
BUN: 25 mg/dL — AB (ref 6–20)
CHLORIDE: 101 mmol/L (ref 101–111)
CO2: 28 mmol/L (ref 22–32)
CREATININE: 1.44 mg/dL — AB (ref 0.61–1.24)
Calcium: 8.8 mg/dL — ABNORMAL LOW (ref 8.9–10.3)
GFR calc Af Amer: >60 mL/min (ref >60)
GFR, EST NON AFRICAN AMERICAN: 54 mL/min — AB (ref >60)
GLUCOSE: 275 mg/dL — AB (ref 65–99)
Potassium: 3.2 mmol/L — ABNORMAL LOW (ref 3.5–5.1)
Sodium: 138 mmol/L (ref 135–145)

## 2015-10-15 LAB — CBC WITH DIFFERENTIAL/PLATELET
Basophils Absolute: 0 10*3/uL (ref 0.0–0.1)
Basophils Relative: 0 %
Eosinophils Absolute: 0.2 10*3/uL (ref 0.0–0.7)
Eosinophils Relative: 2 %
HEMATOCRIT: 43.3 % (ref 39.0–52.0)
HEMOGLOBIN: 14.5 g/dL (ref 13.0–17.0)
LYMPHS ABS: 2.2 10*3/uL (ref 0.7–4.0)
Lymphocytes Relative: 18 %
MCH: 28.5 pg (ref 26.0–34.0)
MCHC: 33.5 g/dL (ref 30.0–36.0)
MCV: 85.1 fL (ref 78.0–100.0)
MONO ABS: 1.2 10*3/uL — AB (ref 0.1–1.0)
MONOS PCT: 10 %
NEUTROS ABS: 8.6 10*3/uL — AB (ref 1.7–7.7)
NEUTROS PCT: 70 %
Platelets: 231 10*3/uL (ref 150–400)
RBC: 5.09 MIL/uL (ref 4.22–5.81)
RDW: 14 % (ref 11.5–15.5)
WBC: 12.2 10*3/uL — ABNORMAL HIGH (ref 4.0–10.5)

## 2015-10-15 LAB — GLUCOSE, CAPILLARY
GLUCOSE-CAPILLARY: 332 mg/dL — AB (ref 65–99)
GLUCOSE-CAPILLARY: 251 mg/dL — AB (ref 65–99)
GLUCOSE-CAPILLARY: 202 mg/dL — AB (ref 65–99)
Glucose-Capillary: 248 mg/dL — ABNORMAL HIGH (ref 65–99)
Glucose-Capillary: 367 mg/dL — ABNORMAL HIGH (ref 65–99)
Glucose-Capillary: 340 mg/dL — ABNORMAL HIGH (ref 65–99)

## 2015-10-15 LAB — LEGIONELLA ANTIGEN, URINE

## 2015-10-15 LAB — BRAIN NATRIURETIC PEPTIDE: B Natriuretic Peptide: 606.7 pg/mL — ABNORMAL HIGH (ref 0.0–100.0)

## 2015-10-15 LAB — PROCALCITONIN: Procalcitonin: 3.99 ng/mL

## 2015-10-15 LAB — MAGNESIUM: Magnesium: 1.9 mg/dL (ref 1.7–2.4)

## 2015-10-15 LAB — PHOSPHORUS: PHOSPHORUS: 3.3 mg/dL (ref 2.5–4.6)

## 2015-10-15 MED ORDER — DEXTROSE 5 % IV SOLN
1.0000 g | INTRAVENOUS | Status: DC
  Administered 2015-10-15 – 2015-10-16 (×2): 1 g via INTRAVENOUS
  Filled 2015-10-15 (×3): qty 10

## 2015-10-15 MED ORDER — DIGOXIN 125 MCG PO TABS
0.1250 mg | ORAL_TABLET | Freq: Every day | ORAL | Status: DC
  Administered 2015-10-15 – 2015-10-17 (×3): 0.125 mg via ORAL
  Filled 2015-10-15 (×3): qty 1

## 2015-10-15 MED ORDER — POTASSIUM CHLORIDE CRYS ER 20 MEQ PO TBCR
30.0000 meq | EXTENDED_RELEASE_TABLET | ORAL | Status: AC
  Administered 2015-10-15 (×2): 30 meq via ORAL
  Filled 2015-10-15 (×2): qty 1

## 2015-10-15 NOTE — Progress Notes (Signed)
Inpatient Diabetes Program Recommendations  AACE/ADA: New Consensus Statement on Inpatient Glycemic Control (2015)  Target Ranges:  Prepandial:   less than 140 mg/dL      Peak postprandial:   less than 180 mg/dL (1-2 hours)      Critically ill patients:  140 - 180 mg/dL   Review of Glycemic Control  Inpatient Diabetes Program Recommendations:  Insulin - Basal: increase Lantus to 20 units  Thank you  Piedad Climes BSN, RN,CDE Inpatient Diabetes Coordinator [phone] (team pager)

## 2015-10-15 NOTE — Progress Notes (Signed)
Pt having intermittent bursts of MAT, taching in 150s, resting in the 110s. Asymptomatic. BP 124/77. Cardiology notified

## 2015-10-15 NOTE — Progress Notes (Signed)
[facility] ADULT ICU REPLACEMENT PROTOCOL FOR AM LAB REPLACEMENT ONLY  The patient does apply for the [facility] Adult ICU Electrolyte Replacment Protocol based on the criteria listed below:   1. Is GFR >/= 40 ml/min? Yes.    Patient's GFR today is >60 2. Is urine output >/= 0.5 ml/kg/hr for the last 6 hours? Yes.   Patient's UOP is 1.9 ml/kg/hr 3. Is BUN < 60 mg/dL? Yes.    Patient's BUN today is 25 4. Abnormal electrolyte(s): Potassium, 3.2 5. Ordered repletion with: [facility] adult ICU replacement protocol 6. If a panic level lab has been reported, has the CCM MD in charge been notified? Yes.  .   Physician:  Dr. Loletha Grayer  [facility], Alda Berthold E 10/15/2015 5:48 AM

## 2015-10-15 NOTE — Progress Notes (Signed)
Patient Name: Sean Hinton Date of Encounter: 10/15/2015  Principal Problem:   Community acquired pneumonia Active Problems:   Essential hypertension   Hyperlipidemia   Smoking   Diabetes mellitus without complication (HCC)   CAD (coronary artery disease)   Elevated troponin   Cough   Chest pain   CAP (community acquired pneumonia)   Acute respiratory failure with hypoxia (HCC)   Acute on chronic systolic CHF (congestive heart failure) (HCC)   Demand ischemia [facility])   Primary Cardiologist: Dr. Royann Shivers Patient Profile: 54 y.o. male with past medical history of CAD (CTO in LAD, ostial 80%, RPDA-1 50%), ischemic cardiomyopathy (EF 40-45% in 01/2015) , PAF (on Xarelto), HTN, HLD, substance abuse (Cocaine) and tobacco abuse who presented to [facility] ED on 10/13/2015 for chest pain for the past two weeks, accompanied by shortness of breath and a productive cough. Had acute respiratory failure on 10/14/2015.  SUBJECTIVE: Reports improvement in his breathing since yesterday, still not at baseline. Still having some chest pain with coughing.  OBJECTIVE Filed Vitals:   10/15/15 0754 10/15/15 0800 10/15/15 0900 10/15/15 0919  BP:  143/107    Pulse:   103   Temp: 98.9 F (37.2 C)     TempSrc: Oral     Resp:  18 26   Height:      Weight:      SpO2:   99% 98%    Intake/Output Summary (Last 24 hours) at 10/15/15 1105 Last data filed at 10/15/15 6301  Gross per 24 hour  Intake   1265 ml  Output   5000 ml  Net  -3735 ml   Filed Weights   10/13/15 0408 10/13/15 1230 10/14/15 0445  Weight: 190 lb 0.6 oz (86.2 kg) 163 lb 9.3 oz (74.2 kg) 162 lb 8 oz (73.71 kg)    PHYSICAL EXAM General: Thin African American  male in no acute distress. Head: Normocephalic, atraumatic.  Neck: Supple without bruits, JVD not elevated. Lungs:  Resp regular and unlabored, Minimal rales at bases bilaterally. Heart: RRR, S1, S2, no S3, S4, or murmur; no rub. Abdomen: Soft, non-tender,  non-distended with normoactive bowel sounds. No hepatomegaly. No rebound/guarding. No obvious abdominal masses. Extremities: No clubbing, cyanosis, or edema. Distal pedal pulses are 2+ bilaterally. Neuro: Alert and oriented X 3. Moves all extremities spontaneously. Psych: Normal affect.  LABS: CBC: Recent Labs  10/14/15 0518 10/15/15 0350  WBC 16.4* 12.2*  NEUTROABS  --  8.6*  HGB 12.9* 14.5  HCT 38.9* 43.3  MCV 85.5 85.1  PLT 210 231   INR: Recent Labs  10/13/15 0247  INR 1.31   Basic Metabolic Panel: Recent Labs  10/13/15 1337  10/14/15 0830 10/15/15 0350  NA  --   < > 139 138  K  --   < > 3.9 3.2*  CL  --   < > 106 101  CO2  --   < > 22 28  GLUCOSE  --   < > 118* 275*  BUN  --   < > 24* 25*  CREATININE  --   < > 1.31* 1.44*  CALCIUM  --   < > 9.1 8.8*  MG 1.9  --   --  1.9  PHOS  --   --   --  3.3  < > = values in this interval not displayed.  Cardiac Enzymes: Recent Labs  10/13/15 1337 10/13/15 2241 10/14/15 0518  TROPONINI 0.16* 0.16* 0.11*    Recent Labs  10/13/15 0256  TROPIPOC 0.22*   BNP:  B NATRIURETIC PEPTIDE  Date/Time Value Ref Range Status  10/15/2015 03:50 AM 606.7* 0.0 - 100.0 pg/mL Final  10/14/2015 08:30 AM 1219.9* 0.0 - 100.0 pg/mL Final   No results found for: PROBNP D-dimer: Recent Labs  10/13/15 0247 10/13/15 2241  DDIMER 0.83* 0.56*   Hemoglobin A1C: Recent Labs  10/13/15 0700  HGBA1C 9.6*   Fasting Lipid Panel: Recent Labs  10/13/15 0726  CHOL 128  HDL 35*  LDLCALC 80  TRIG 67  CHOLHDL 3.7    TELE:   NSR, rate mostly in 80's - 90's. Peaking in the 140's.     ECHO: 10/14/2015 Study Conclusions - Left ventricle: The cavity size was normal. Wall thickness was increased in a pattern of mild LVH. Systolic function was moderately reduced. The estimated ejection fraction was in the range of 35% to 40%. Severe hypokinesis of the apicalanteroseptal and apical myocardium. - Mitral valve: There was  mild regurgitation. - Left atrium: The atrium was mildly dilated. - Right ventricle: The cavity size was normal. Wall thickness was mildly increased.  Radiology/Studies: Dg Chest Port 1 View: 10/13/2015  CLINICAL DATA:  Chest pain and shortness of breath for 1 day. Fever and cough. Pneumonia. EXAM: PORTABLE CHEST 1 VIEW COMPARISON:  Earlier today FINDINGS: Progression of airspace opacity diffusely, patchy and asymmetric to the left. There were kerley lines and fissural thickening on prior chest x-ray. No effusion or air leak. Normal heart size and vascular pedicle width. IMPRESSION: Marked progression of bilateral pneumonia. Possible superimposed pulmonary edema. Electronically Signed   By: Marnee Spring M.D.   On: 10/13/2015 22:17     Current Medications:  . atorvastatin  80 mg Oral QPM  . azithromycin  500 mg Intravenous Q24H  . furosemide  40 mg Intravenous 3 times per day  . hydrALAZINE  75 mg Oral TID  . insulin aspart  0-15 Units Subcutaneous TID WC  . insulin glargine  10 Units Subcutaneous QHS  . ipratropium-albuterol  3 mL Nebulization Q6H  . isosorbide mononitrate  30 mg Oral Daily  . pantoprazole (PROTONIX) IV  40 mg Intravenous Q24H  . piperacillin-tazobactam (ZOSYN)  IV  3.375 g Intravenous 3 times per day  . rivaroxaban  20 mg Oral Q supper  . spironolactone  25 mg Oral Daily  . vancomycin  1,000 mg Intravenous Q12H      ASSESSMENT AND PLAN:  1. Acute Respiratory Failure - most likely consistent with multilobar PNA. Does have some acute on chronic CHF. BNP elevated to 1219. Started on Lasix 40mg  IV TID. Net output -4.5L so far. Still has some rales on examination. - Reports his weight has been between 165lbs - 170lbs over the recent weeks. Now 162 lbs on 10/15/2015. - continue Aldactone, Nitrates, Hydralazine, and Lasix. No BB seconday to cocaine abuse.  2. CAP - per admitting team  3. Acute on Chronic Systolic CHF - repeat echo shows EF of 35-40%.  - BNP  elevated to 1219. - refer to #1  4. Chest Pain with Typical and Atypical Features - history of CAD with most recent cath in 02/2015 showing CTO of the LAD, 50% stenosis in the RPDA-1 and 70% stenosis at the RPDA-2, and 80% stenosis in the [facility]. - chest pain is worse with inspiration and relieved with gas relief tablets which makes it atypical yet he reports it has an exertional quality.  - cyclic troponin values have been 0.19, 0.16, 0.16,  and 0.11. Consistent with demand ischemia.  5. Atrial Fibrillation - This patients CHA2DS2-VASc Score and unadjusted Ischemic Stroke Rate (% per year) is equal to 3.2 % stroke rate/year from a score of 3 (HTN, CHF, Vascular). Continue Xarelto - In NSR, with rate mostly in 80's - 90's. Peaking in the 140's. No BB due to Cocaine use. No Cardizem due to low EF.  6. Substance Abuse - cocaine and tobacco abuse.  - counseled on cessation. - avoid BB.  Lorri Frederick , PA-C 11:05 AM 10/15/2015 Pager: [phone] Patient seen and examined and history reviewed. Agree with above findings and plan. Moved back to telemetry today. Feeling much better. Breathing improved. Diuresed over 3.5 liters yesterday. Lungs are much clearer. On appropriate medical therapy now. Will follow.  Livianna Petraglia [place], MDFACC 10/15/2015 2:03 PM

## 2015-10-15 NOTE — Progress Notes (Addendum)
PROGRESS NOTE    Clearance Chenault ZOX:[id number] DOB: Mar 08, 1961 DOA: 10/13/2015 PCP: Lora Paula, MD   Subjective: Feels much better this morning, on room air satting 98%. Chest pain resolved, had a slight chest pain with deep inspiration. Transfer to telemetry.  HPI/Brief narrative 54 year old male patient with history of CAD, ischemic CM (LVEF 40-45 percent in April 2016, chronic systolic CHF, PAF on xarelto, HTN, HLD, tobacco abuse, DM 2 presented to [facility] ED on 10/13/15 for precordial pressure type of chest pain-worse with activity, followed several days later by productive cough and dyspnea. In the ED, chest x-ray showed multiple patchy opacities suggestive of pneumonia, tachypnea, tachycardia, glucose 299 and troponin of 0.22. Cardiology consulted. Initially managed on stepdown unit. Overnight 12/19, anxiety/agitation, worsening dyspnea, chest pain and chest x-ray showed worsening infiltrates. Placed on BiPAP. CCM consulted and transferred to ICU under their care on 12/20.  Assessment/Plan: Principal Problem:   Community acquired pneumonia Active Problems:   Essential hypertension   Hyperlipidemia   Smoking   Diabetes mellitus without complication (HCC)   CAD (coronary artery disease)   Elevated troponin   Cough   Chest pain   CAP (community acquired pneumonia)   Acute respiratory failure with hypoxia (HCC)   Acute on chronic systolic CHF (congestive heart failure) (HCC)   Demand ischemia (HCC)   Acute hypoxic respiratory failure -Secondary to pulmonary infiltrates. Likely secondary to CAP and acute on chronic CHF. -Patient developed worsening dyspnea, hypoxia and infiltrates on chest x-ray overnight 12/19. Required BiPAP. -CCM evaluated and transferred patient to ICU under their care on 12/20. NPO in case he were to require intubation. -After diuresis and removal of almost 5 L of fluids patient felt much better, he is on room air right now. -Elevated D dimers  (likely secondary to infection), no suspicion for PE as patient reported compliance to Xarelto.  Acute on chronic systolic CHF  -LVEF is 35-40% with severe hypokinesis of apical and anterior septal myocardium. -BNP is elevated at 1219, patient chest x-ray worsened with possible pulmonary edema. -Aggressive IV diuresis started with net removal of 4.9 L of IV fluids. cardiology following.   Community acquired pneumonia-bacterial versus viral - Started empirically on IV Rocephin and doxycycline. Switched to Zosyn and vancomycin. Switched back to Rocephin/azithromycin. - will need follow-up chest x-ray in 3-4 weeks to ensure resolution of pneumonia findings - Lactate: Normal, blood cultures 2: Pending, HIV antibody: Nonreactive & sputum culture yet to be sent. - Tobacco cessation counseled. - Urine Legionella antigen: Pending. Urinary pneumococcal antigen: Negative - Negative influenza, respiratory panel pending.  Atypical chest pain/elevated troponin - Cardiology input appreciated: Feel that he has atypical chest pain and his history is more consistent with musculoskeletal pain related to cough. They recommend continuing home dose of metoprolol, resume Xarelto, DC IV Heparin, start nitrates. No further ischemic workup planned. Dr. [place] has reviewed prior Studies: CTO of LAD is not ideal for CTO PCI. - Elevated tropshows flat curve, likely related to demand ischemia rather than acute MI.   Paroxysmal atrial fibrillation - Currently in sinus rhythm - mild ST in 100's.  - Patient adamantly claims compliance to Xarelto however history not reliable.  - Continue xarelto. Patient also denies substance abuse/cocaine abuse but per CCM-history of cocaine abuse (? Family reported). Avoiding beta blockers now.  - This patients CHA2DS2-VASc Score and unadjusted Ischemic Stroke Rate (% per year) is equal to 3.2 % stroke rate/year from a score of 3 for CHF, HTN and diabetes Above  score calculated as 1 point  each if present [CHF, HTN, DM, Vascular=MI/PAD/Aortic Plaque, Age if 65-74, or Male] Above score calculated as 2 points each if present [Age > 75, or Stroke/TIA/TE]  Hypokalemia Potassium of 3.2, likely secondary to aggressive diuresis. We will replete with oral supplements.  Essential hypertension - Uncontrolled. Continue hydralazine and metoprolol and follow closely.  Hyperlipidemia - Statins.  Type II DM - Follow A1c: 9.6. SSI. Improved control.  Tobacco abuse - Cessation counseled.  CAD - Evaluation and management as indicated above.  NSVT - As per ED nursing, patient had a 10 beat asymptomatic NSVT. Monitor telemetry. Continue Metoprolol. K 4.1. Check Mg  Acute kidney injury -Presented with creatinine of 1.3, last creatinine was 1.1 from May 2016. -Follow closely.  Substance abuse/cocaine - will need cessation counseling. - UDS positive for opiates and cocaine   DVT prophylaxis: Patient will be on full dose Xarelto AC Code Status: Full Family Communication: None at bedside Disposition Plan: transferred back to [facility], will transfer to cardiac Tele.   Consultants:  Cardiology   CCM  Procedures:  BiPAP   Antibiotics:  IV Rocephin 12/18 > discontinued   Doxycycline 2/18 >Discontinued  IV Vanc>  IV Zosyn>  IV Azithromycin>     Objective: Filed Vitals:   10/15/15 0800 10/15/15 0900 10/15/15 0919 10/15/15 1158  BP: 143/107     Pulse:  103    Temp:    98.8 F (37.1 C)  TempSrc:    Oral  Resp: 18 26    Height:      Weight:      SpO2:  99% 98%     Intake/Output Summary (Last 24 hours) at 10/15/15 1212 Last data filed at 10/15/15 1200  Gross per 24 hour  Intake 1252.5 ml  Output   5375 ml  Net -4122.5 ml   Filed Weights   10/13/15 0408 10/13/15 1230 10/14/15 0445  Weight: 86.2 kg (190 lb 0.6 oz) 74.2 kg (163 lb 9.3 oz) 73.71 kg (162 lb 8 oz)   temperature 98.52F.   Exam:  General exam: pleasant middle-aged male lying propped up in  bed, on BiPAP, slightly restless, tachypneic. Respiratory system: reduced breath sounds bilaterally/harsh breath sounds. Mild  increased work of breathing. Cardiovascular system: S1 & S2 heard, RRR. No JVD, murmurs, gallops, clicks or pedal edema. telemetry: Sinus rhythm - ST.  Gastrointestinal system: Abdomen is nondistended, soft and nontender. Normal bowel sounds heard. Central nervous system: Alert and oriented. No focal neurological deficits. Extremities: Symmetric 5 x 5 power.   Data Reviewed: Basic Metabolic Panel:  Recent Labs Lab 10/13/15 0247 10/13/15 1337 10/13/15 2241 10/14/15 0830 10/15/15 0350  NA 137  --  140 139 138  K 4.1  --  3.9 3.9 3.2*  CL 101  --  106 106 101  CO2 27  --  26 22 28   GLUCOSE 299*  --  128* 118* 275*  BUN 19  --  20 24* 25*  CREATININE 1.46*  --  1.33* 1.31* 1.44*  CALCIUM 8.8*  --  8.9 9.1 8.8*  MG  --  1.9  --   --  1.9  PHOS  --   --   --   --  3.3   Liver Function Tests: No results for input(s): AST, ALT, ALKPHOS, BILITOT, PROT, ALBUMIN in the last 168 hours. No results for input(s): LIPASE, AMYLASE in the last 168 hours. No results for input(s): AMMONIA in the last 168 hours. CBC:  Recent  Labs Lab 10/13/15 0247 10/14/15 0518 10/15/15 0350  WBC 10.7* 16.4* 12.2*  NEUTROABS  --   --  8.6*  HGB 13.2 12.9* 14.5  HCT 39.5 38.9* 43.3  MCV 85.9 85.5 85.1  PLT 196 210 231   Cardiac Enzymes:  Recent Labs Lab 10/13/15 0700 10/13/15 1015 10/13/15 1337 10/13/15 2241 10/14/15 0518  TROPONINI 0.20* 0.19* 0.16* 0.16* 0.11*   BNP (last 3 results) No results for input(s): PROBNP in the last 8760 hours. CBG:  Recent Labs Lab 10/14/15 1738 10/14/15 1947 10/14/15 2319 10/15/15 0414 10/15/15 0752  GLUCAP 225* 113* 332* 248* 251*    Recent Results (from the past 240 hour(s))  Culture, blood (routine x 2) Call MD if unable to obtain prior to antibiotics being given     Status: None (Preliminary result)   Collection Time:  10/13/15  7:00 AM  Result Value Ref Range Status   Specimen Description BLOOD RIGHT FOREARM  Final   Special Requests BOTTLES DRAWN AEROBIC AND ANAEROBIC  Final   Culture NO GROWTH 2 DAYS  Final   Report Status PENDING  Incomplete  Culture, blood (routine x 2) Call MD if unable to obtain prior to antibiotics being given     Status: None (Preliminary result)   Collection Time: 10/13/15  7:20 AM  Result Value Ref Range Status   Specimen Description BLOOD LEFT HAND  Final   Special Requests BOTTLES DRAWN AEROBIC AND ANAEROBIC  Final   Culture NO GROWTH 2 DAYS  Final   Report Status PENDING  Incomplete  MRSA PCR Screening     Status: None   Collection Time: 10/13/15  1:12 PM  Result Value Ref Range Status   MRSA by PCR NEGATIVE NEGATIVE Final    Comment:        The GeneXpert MRSA Assay (FDA approved for NASAL specimens only), is one component of a comprehensive MRSA colonization surveillance program. It is not intended to diagnose MRSA infection nor to guide or monitor treatment for MRSA infections.   Culture, sputum-assessment     Status: None   Collection Time: 10/14/15 12:43 AM  Result Value Ref Range Status   Specimen Description EXPECTORATED SPUTUM  Final   Special Requests NONE  Final   Sputum evaluation   Final    THIS SPECIMEN IS ACCEPTABLE. RESPIRATORY CULTURE REPORT TO FOLLOW.   Report Status 10/14/2015 FINAL  Final  Culture, respiratory (NON-Expectorated)     Status: None (Preliminary result)   Collection Time: 10/14/15 12:43 AM  Result Value Ref Range Status   Specimen Description EXPECTORATED SPUTUM  Final   Special Requests NONE  Final   Gram Stain   Final    FEW WBC PRESENT,BOTH PMN AND MONONUCLEAR FEW SQUAMOUS EPITHELIAL CELLS PRESENT FEW GRAM NEGATIVE RODS Performed at [facility]    Culture   Final    Culture reincubated for better growth Performed at [facility]    Report Status PENDING  Incomplete  MRSA PCR Screening      Status: None   Collection Time: 10/14/15 10:48 AM  Result Value Ref Range Status   MRSA by PCR NEGATIVE NEGATIVE Final    Comment:        The GeneXpert MRSA Assay (FDA approved for NASAL specimens only), is one component of a comprehensive MRSA colonization surveillance program. It is not intended to diagnose MRSA infection nor to guide or monitor treatment for MRSA infections.  Studies: Dg Chest Port 1 View  10/13/2015  CLINICAL DATA:  Chest pain and shortness of breath for 1 day. Fever and cough. Pneumonia. EXAM: PORTABLE CHEST 1 VIEW COMPARISON:  Earlier today FINDINGS: Progression of airspace opacity diffusely, patchy and asymmetric to the left. There were kerley lines and fissural thickening on prior chest x-ray. No effusion or air leak. Normal heart size and vascular pedicle width. IMPRESSION: Marked progression of bilateral pneumonia. Possible superimposed pulmonary edema. Electronically Signed   By: Marnee Spring M.D.   On: 10/13/2015 22:17        Scheduled Meds: . atorvastatin  80 mg Oral QPM  . azithromycin  500 mg Intravenous Q24H  . furosemide  40 mg Intravenous 3 times per day  . hydrALAZINE  75 mg Oral TID  . insulin aspart  0-15 Units Subcutaneous TID WC  . insulin glargine  10 Units Subcutaneous QHS  . ipratropium-albuterol  3 mL Nebulization Q6H  . isosorbide mononitrate  30 mg Oral Daily  . pantoprazole (PROTONIX) IV  40 mg Intravenous Q24H  . piperacillin-tazobactam (ZOSYN)  IV  3.375 g Intravenous 3 times per day  . rivaroxaban  20 mg Oral Q supper  . spironolactone  25 mg Oral Daily  . vancomycin  1,000 mg Intravenous Q12H   Continuous Infusions:    Principal Problem:   Community acquired pneumonia Active Problems:   Essential hypertension   Hyperlipidemia   Smoking   Diabetes mellitus without complication (HCC)   CAD (coronary artery disease)   Elevated troponin   Cough   Chest pain   CAP (community acquired pneumonia)    Acute respiratory failure with hypoxia (HCC)   Acute on chronic systolic CHF (congestive heart failure) (HCC)   Demand ischemia (HCC)    Time spent: 45 minutes.    Clint Lipps, MD [facility] Pager [phone]  If 7PM-7AM, please contact night-coverage [email or web address] Password [email or web address] 10/15/2015, 12:12 PM    LOS: 2 days

## 2015-10-15 NOTE — Progress Notes (Signed)
  Paged concerning patient's HR constantly staying in the 150's. His BB has been discontinued due to recent cocaine use. Cardizem is not a good choice for him due to low EF. Will start low-dose Digoxin at 0.125mg  daily to help with rate control. Continue to monitor.  Signed, Ellsworth Lennox, PA-C 10/15/2015, 4:48 PM Pager: [phone]

## 2015-10-16 ENCOUNTER — Encounter (HOSPITAL_COMMUNITY): Payer: Self-pay | Admitting: General Practice

## 2015-10-16 DIAGNOSIS — F419 Anxiety disorder, unspecified: Secondary | ICD-10-CM

## 2015-10-16 DIAGNOSIS — I483 Typical atrial flutter: Secondary | ICD-10-CM

## 2015-10-16 LAB — CBC
HCT: 42 % (ref 39.0–52.0)
Hemoglobin: 13.7 g/dL (ref 13.0–17.0)
MCH: 28.4 pg (ref 26.0–34.0)
MCHC: 32.6 g/dL (ref 30.0–36.0)
MCV: 87 fL (ref 78.0–100.0)
Platelets: 265 10*3/uL (ref 150–400)
RBC: 4.83 MIL/uL (ref 4.22–5.81)
RDW: 14.2 % (ref 11.5–15.5)
WBC: 10.6 10*3/uL — ABNORMAL HIGH (ref 4.0–10.5)

## 2015-10-16 LAB — GLUCOSE, CAPILLARY
GLUCOSE-CAPILLARY: 386 mg/dL — AB (ref 65–99)
GLUCOSE-CAPILLARY: 351 mg/dL — AB (ref 65–99)
Glucose-Capillary: 129 mg/dL — ABNORMAL HIGH (ref 65–99)
Glucose-Capillary: 189 mg/dL — ABNORMAL HIGH (ref 65–99)

## 2015-10-16 LAB — BASIC METABOLIC PANEL
Anion gap: 11 (ref 5–15)
BUN: 20 mg/dL (ref 6–20)
CO2: 27 mmol/L (ref 22–32)
CREATININE: 1.52 mg/dL — AB (ref 0.61–1.24)
Calcium: 8.5 mg/dL — ABNORMAL LOW (ref 8.9–10.3)
Chloride: 101 mmol/L (ref 101–111)
GFR calc Af Amer: 59 mL/min — ABNORMAL LOW (ref >60)
GFR, EST NON AFRICAN AMERICAN: 51 mL/min — AB (ref >60)
Glucose, Bld: 372 mg/dL — ABNORMAL HIGH (ref 65–99)
Potassium: 3.2 mmol/L — ABNORMAL LOW (ref 3.5–5.1)
SODIUM: 139 mmol/L (ref 135–145)

## 2015-10-16 LAB — PROCALCITONIN: PROCALCITONIN: 2.38 ng/mL

## 2015-10-16 LAB — CULTURE, RESPIRATORY W GRAM STAIN

## 2015-10-16 LAB — CULTURE, RESPIRATORY: CULTURE: NORMAL

## 2015-10-16 MED ORDER — POTASSIUM CHLORIDE CRYS ER 20 MEQ PO TBCR
30.0000 meq | EXTENDED_RELEASE_TABLET | Freq: Two times a day (BID) | ORAL | Status: DC
  Administered 2015-10-16 – 2015-10-17 (×2): 30 meq via ORAL
  Filled 2015-10-16 (×3): qty 1

## 2015-10-16 MED ORDER — INSULIN ASPART 100 UNIT/ML ~~LOC~~ SOLN
4.0000 [IU] | Freq: Three times a day (TID) | SUBCUTANEOUS | Status: DC
  Administered 2015-10-16 – 2015-10-17 (×4): 4 [IU] via SUBCUTANEOUS

## 2015-10-16 MED ORDER — IPRATROPIUM-ALBUTEROL 0.5-2.5 (3) MG/3ML IN SOLN
3.0000 mL | Freq: Two times a day (BID) | RESPIRATORY_TRACT | Status: DC
  Administered 2015-10-17: 3 mL via RESPIRATORY_TRACT
  Filled 2015-10-16: qty 3

## 2015-10-16 MED ORDER — POTASSIUM CHLORIDE CRYS ER 20 MEQ PO TBCR
40.0000 meq | EXTENDED_RELEASE_TABLET | Freq: Every day | ORAL | Status: DC
  Administered 2015-10-16 – 2015-10-17 (×2): 40 meq via ORAL
  Filled 2015-10-16 (×2): qty 2

## 2015-10-16 MED ORDER — DILTIAZEM HCL 100 MG IV SOLR
5.0000 mg/h | INTRAVENOUS | Status: DC
  Administered 2015-10-16: 5 mg/h via INTRAVENOUS
  Administered 2015-10-17: 15 mg/h via INTRAVENOUS
  Filled 2015-10-16 (×3): qty 100

## 2015-10-16 MED ORDER — POTASSIUM CHLORIDE CRYS ER 20 MEQ PO TBCR: 40.0000 meq | EXTENDED_RELEASE_TABLET | Freq: Once | ORAL | Status: DC

## 2015-10-16 NOTE — Progress Notes (Signed)
Pt HR in the 150s-200's intermittently throughout day. Went in room to check on pt several times, pt on phone with girlfriend. Pt again this evening HR in the 200's, girlfriend in room visiting rearranging room and pt belongings. Talked to the pt, updated her, settled him down he stated "now do you believe me" to girlfriend in relation to disease process. Pt called down. Now back in Sinus tach rates in the 100-115

## 2015-10-16 NOTE — Discharge Instructions (Signed)

## 2015-10-16 NOTE — Progress Notes (Signed)
Pt in aflutter, rates 170s, lungs clear but diminished. Cardiology aware. Going to put in order for cardizem

## 2015-10-16 NOTE — Progress Notes (Signed)
ANTICOAGULATION CONSULT NOTE - Follow Up Consult  Pharmacy Consult for  IV heparin -> Xarelto Indication: atrial fibrillation  No Known Allergies  Patient Measurements: Height: 5\' 10"  (177.8 cm) Weight: 151 lb 11.2 oz (68.811 kg) IBW/kg (Calculated) : 73  Vital Signs: Temp: 98.3 F (36.8 C) (12/22 0500) BP: 124/88 mmHg (12/22 1010) Pulse Rate: 95 (12/22 0500)  Labs:  Recent Labs  10/13/15 1337  10/13/15 2241  10/14/15 0518 10/14/15 0830 10/15/15 0350 10/16/15 0436  HGB  --   --   --   < > 12.9*  --  14.5 13.7  HCT  --   --   --   --  38.9*  --  43.3 42.0  PLT  --   --   --   --  210  --  231 265  CREATININE  --   < > 1.33*  --   --  1.31* 1.44* 1.52*  TROPONINI 0.16*  --  0.16*  --  0.11*  --   --   --   < > = values in this interval not displayed.  Estimated Creatinine Clearance: 55.3 mL/min (by C-G formula based on Cr of 1.52).  Assessment: 54 yo male on chronic Xarelto for afib, as prior to admission.  Chest pain thought related to cough/musculoskeletal pain.  Scr rising a bit to 1.52  Goal of Therapy:  appropriate Xarelto dose for renal function and indication Monitor platelets by anticoagulation protocol: Yes   Plan:   Xarelto 20 mg daily with supper.  Intermittent CBC.  Reinforce Xarelto compliance.  44, BCPS  Clinical Pharmacist Pager [phone]  10/16/2015 11:54 AM

## 2015-10-16 NOTE — Progress Notes (Signed)
Patient Name: Sean Hinton Date of Encounter: 10/16/2015  Principal Problem:   Community acquired pneumonia Active Problems:   Essential hypertension   Hyperlipidemia   Smoking   Diabetes mellitus without complication (HCC)   CAD (coronary artery disease)   Elevated troponin   Cough   Chest pain   CAP (community acquired pneumonia)   Acute respiratory failure with hypoxia (HCC)   Acute on chronic systolic CHF (congestive heart failure) (HCC)   Demand ischemia [facility])   Primary Cardiologist: Dr. Royann Shivers Patient Profile: 54 y.o. male with past medical history of CAD (CTO in LAD, ostial 80%, RPDA-1 50%), ischemic cardiomyopathy (EF 40-45% in 01/2015) , PAF (on Xarelto), HTN, HLD, substance abuse (Cocaine) and tobacco abuse who presented to [facility] ED on 10/13/2015 for chest pain for the past two weeks, accompanied by shortness of breath and a productive cough. Had acute respiratory failure on 10/14/2015.  SUBJECTIVE: Reports feeling short of breath this morning. Denies any chest pain or palpitations.  OBJECTIVE Filed Vitals:   10/15/15 1656 10/15/15 2109 10/16/15 0500 10/16/15 0805  BP:  109/75 147/79   Pulse: 130 92 95   Temp:  98.5 F (36.9 C) 98.3 F (36.8 C)   TempSrc:      Resp:  17 18   Height:      Weight:   151 lb 11.2 oz (68.811 kg)   SpO2:  100% 98% 98%    Intake/Output Summary (Last 24 hours) at 10/16/15 0881 Last data filed at 10/16/15 0800  Gross per 24 hour  Intake   1390 ml  Output   1615 ml  Net   -225 ml   Filed Weights   10/13/15 1230 10/14/15 0445 10/16/15 0500  Weight: 163 lb 9.3 oz (74.2 kg) 162 lb 8 oz (73.71 kg) 151 lb 11.2 oz (68.811 kg)    PHYSICAL EXAM General: Thin African American  male in no acute distress. Head: Normocephalic, atraumatic.  Neck: Supple without bruits, JVD not elevated. Lungs:  Resp regular and unlabored, Minimal rales at bases bilaterally. Heart: Irregularly irregular, S1, S2, no S3, S4, or murmur; no  rub. Abdomen: Soft, non-tender, non-distended with normoactive bowel sounds. No hepatomegaly. No rebound/guarding. No obvious abdominal masses. Extremities: No clubbing, cyanosis, or edema. Distal pedal pulses are 2+ bilaterally. Neuro: Alert and oriented X 3. Moves all extremities spontaneously. Psych: Normal affect.  LABS: CBC:  Recent Labs  10/15/15 0350 10/16/15 0436  WBC 12.2* 10.6*  NEUTROABS 8.6*  --   HGB 14.5 13.7  HCT 43.3 42.0  MCV 85.1 87.0  PLT 231 265   INR:No results for input(s): INR in the last 72 hours. Basic Metabolic Panel: Recent Labs  10/13/15 1337  10/15/15 0350 10/16/15 0436  NA  --   < > 138 139  K  --   < > 3.2* 3.2*  CL  --   < > 101 101  CO2  --   < > 28 27  GLUCOSE  --   < > 275* 372*  BUN  --   < > 25* 20  CREATININE  --   < > 1.44* 1.52*  CALCIUM  --   < > 8.8* 8.5*  MG 1.9  --  1.9  --   PHOS  --   --  3.3  --   < > = values in this interval not displayed.  Cardiac Enzymes:  Recent Labs  10/13/15 1337 10/13/15 2241 10/14/15 0518  TROPONINI 0.16* 0.16*  0.11*   No results for input(s): TROPIPOC in the last 72 hours. BNP:  B NATRIURETIC PEPTIDE  Date/Time Value Ref Range Status  10/15/2015 03:50 AM 606.7* 0.0 - 100.0 pg/mL Final  10/14/2015 08:30 AM 1219.9* 0.0 - 100.0 pg/mL Final   No results found for: PROBNP D-dimer:  Recent Labs  10/13/15 2241  DDIMER 0.56*    TELE:   Atrial flutter with rate in 170's - 180's.  ECHO: 10/14/2015 Study Conclusions - Left ventricle: The cavity size was normal. Wall thickness was increased in a pattern of mild LVH. Systolic function was moderately reduced. The estimated ejection fraction was in the range of 35% to 40%. Severe hypokinesis of the apicalanteroseptal and apical myocardium. - Mitral valve: There was mild regurgitation. - Left atrium: The atrium was mildly dilated. - Right ventricle: The cavity size was normal. Wall thickness was mildly  increased.  Radiology/Studies: Dg Chest Port 1 View: 10/13/2015  CLINICAL DATA:  Chest pain and shortness of breath for 1 day. Fever and cough. Pneumonia. EXAM: PORTABLE CHEST 1 VIEW COMPARISON:  Earlier today FINDINGS: Progression of airspace opacity diffusely, patchy and asymmetric to the left. There were kerley lines and fissural thickening on prior chest x-ray. No effusion or air leak. Normal heart size and vascular pedicle width. IMPRESSION: Marked progression of bilateral pneumonia. Possible superimposed pulmonary edema. Electronically Signed   By: Marnee Spring M.D.   On: 10/13/2015 22:17     Current Medications:  . atorvastatin  80 mg Oral QPM  . azithromycin  500 mg Intravenous Q24H  . cefTRIAXone (ROCEPHIN)  IV  1 g Intravenous Q24H  . digoxin  0.125 mg Oral Daily  . furosemide  40 mg Intravenous 3 times per day  . hydrALAZINE  75 mg Oral TID  . insulin aspart  0-15 Units Subcutaneous TID WC  . insulin aspart  4 Units Subcutaneous TID WC  . insulin glargine  10 Units Subcutaneous QHS  . ipratropium-albuterol  3 mL Nebulization Q6H  . isosorbide mononitrate  30 mg Oral Daily  . pantoprazole (PROTONIX) IV  40 mg Intravenous Q24H  . potassium chloride  30 mEq Oral BID  . potassium chloride  40 mEq Oral Daily  . rivaroxaban  20 mg Oral Q supper  . spironolactone  25 mg Oral Daily      ASSESSMENT AND PLAN:  1. Acute Respiratory Failure - most likely consistent with multilobar PNA. Does have some acute on chronic CHF. BNP elevated to 1219. Started on Lasix 40mg  IV TID. Net output -4.8L so far. - Reports his weight has been between 165lbs - 170lbs over the recent weeks. 162 lbs on 10/15/2015. Reported as 151 lbs today. Doubt the accuracy of this with his net output only being -10/17/2015 yesterday. - continue Aldactone, Nitrates, Hydralazine, and Lasix. No BB seconday to cocaine abuse. - can likely reduce Lasix dose to 40mg  IV daily or BID.   2. CAP - per admitting team  3.  Acute on Chronic Systolic CHF - repeat echo shows EF of 35-40%.  - BNP elevated to 1219. - refer to #1  4. Chest Pain with Typical and Atypical Features - history of CAD with most recent cath in 02/2015 showing CTO of the LAD, 50% stenosis in the RPDA-1 and 70% stenosis at the RPDA-2, and 80% stenosis in the [facility]. - chest pain is worse with inspiration and relieved with gas relief tablets which makes it atypical yet he reports it has an exertional quality.  -  cyclic troponin values have been 0.19, 0.16, 0.16, and 0.11. Consistent with demand ischemia.  5. Paroxysmal Atrial Flutter - This patients CHA2DS2-VASc Score and unadjusted Ischemic Stroke Rate (% per year) is equal to 3.2 % stroke rate/year from a score of 3 (HTN, CHF, Vascular). Continue Xarelto. -  Noted to be in atrial flutter this morning this HR in the 170's- 180's. Previously sinus tach. Will start Cardizem drip even with low EF to avoid repeat episode of acute respiratory failure.  6. Substance Abuse - cocaine and tobacco abuse.  - counseled on cessation. - avoid BB.  Lorri Frederick , PA-C 9:52 AM 10/16/2015 Pager: [phone]   Patient seen and examined and history reviewed. Agree with above findings and plan. Patient went into atrial flutter today with rate 180. Now converted back to NSR with rate 120. Asymptomatic. Breathing is doing ok. Will hold IV cardizem for now. Not a candidate for beta blocker due to cocaine abuse. On Dig already. Will add oral cardizem although this is not ideal with his CHF. His CHF has improved. Would consider switching to oral lasix.   Paulita Licklider [place], MDFACC 10/16/2015 10:33 AM

## 2015-10-16 NOTE — Hospital Discharge Follow-Up (Signed)
Transitional Care Clinic Care Coordination Note:  Admit date:  10/13/15 Discharge date: TBD Discharge Disposition: Home when stable Patient contact: [phone] (home) Emergency contact(s): Exia Dillard [place] (fiance)-[phone]  This Case Manager reviewed patient's EMR and determined patient would benefit from post-discharge medical management and chronic care management services through the [facility]. Patient has a history of CHF, atrial fibrillation, HTN, type II DM, CAD.  Admitted for community acquired pneumonia. This Case Manager met with patient to discuss the services and medical management that can be provided at the [facility]. Patient verbalized understanding and agreed to receive post-discharge care at the [facility].   Patient scheduled for Transitional Care appointment on 10/28/15 at 1100 with Dr. Jarold Song.  Clinic information and appointment time provided to patient. Appointment information also placed on AVS.  Assessment:       Home Environment: Patient lives in a private residence with his fiance.       Support System: friends       Level of functioning: independent       Home DME: none. However, patient does indicate he has a scale as well as a glucometer. He indicated he was told by the [facility] and [facility] that his glucometer was outdated, and they do not carry the strips for the glucometer he has. Placed call to [place], and they indicated that patient would benefit from a new meter. Lowest cost option for an uninsured patient is: True Hydrographic surveyor, True Metrix strips, and True Plus (28g) lancets.  Updated Jacqlyn Krauss, RN CM who indicated she would update provider.        Home care services: none       Transportation: Patient indicated he typically drives himself to his appointments. He indicated his fiance can also take him to his appointments if needed.  Food/Nutrition: Patient indicated he receives $194 of Food Stamps each month. He indicated he has access to needed food.        Medications: Patient uses the pharmacy at [place] for his medications. He indicated he does not have difficulty obtaining needed medications.        Identified Barriers: uninsured. Patient indicated he was in the process of working with Shauna Hugh, Development worker, community at [employer] and [employer], to renew his [employer] prior to admission. Patient aware of the documentation the Financial Counselor needs before application can be processed.          PCP: Previously Dr. Annitta Needs. Patient indicated he plans to establish care with Dr. Adrian Blackwater after 30 days of medical management with the [facility].    Arranged services:        Services communicated to Jacqlyn Krauss, RN CM

## 2015-10-16 NOTE — Progress Notes (Signed)
[facility]                                                                              Patient Demographics  Sean Hinton, is a 54 y.o. male, DOB - Dec 02, 1960, UKG:[id number]  Admit date - 10/13/2015   Admitting Physician Clydie Braun, MD  Outpatient Primary MD for the patient is Lora Paula, MD  LOS - 3   Chief Complaint  Patient presents with  . Chest Pain       Brief HPI   54 year old male patient with history of CAD, ischemic CM (LVEF 40-45 percent in April 2016, chronic systolic CHF, PAF on xarelto, HTN, HLD, tobacco abuse, DM 2 presented to [facility] ED on 10/13/15 for precordial pressure type of chest pain-worse with activity, followed several days later by productive cough and dyspnea. In the ED, chest x-ray showed multiple patchy opacities suggestive of pneumonia, tachypnea, tachycardia, glucose 299 and troponin of 0.22. Cardiology consulted. Initially managed on stepdown unit. Overnight 12/19, anxiety/agitation, worsening dyspnea, chest pain and chest x-ray showed worsening infiltrates. Placed on BiPAP. CCM consulted and transferred to ICU under their care on 12/20. Patient back to the floor on 12/21  Assessment & Plan    Principal Problem: Acute hypoxic respiratory failure, multifactorial secondary to community-acquired pneumonia, atrial fibrillation with RVR, acute on chronic CHF -Patient developed worsening dyspnea, hypoxia and infiltrates on chest x-ray overnight 12/19. Required BiPAP, CCM evaluated and transferred patient to ICU under their care on 12/20.  - However patient improved after diuresis and removal of almost 5 L of fluids, patient transferred back to the floor, O2 sats 98% on room air. - Elevated D dimers (likely secondary to infection), no suspicion for PE as patient reported compliance to Xarelto.   Acute on chronic systolic CHF  -LVEF is 35-40% with severe hypokinesis of apical and anterior septal myocardium. -BNP is  elevated at 1219, patient chest x-ray worsened with possible pulmonary edema. -Continue aggressive IV diuresis, negative balance of 4.5 L, weigth down from 190 on abmit -> 151 today  - Cardiology following  Community acquired pneumonia-bacterial versus viral - Started empirically on IV Rocephin and doxycycline. Switched to Zosyn and vancomycin. Switched back to Rocephin/azithromycin. - will need follow-up chest x-ray in 3-4 weeks to ensure resolution of pneumonia findings - Lactate: Normal, blood cultures 2: Pending, HIV antibody: Nonreactive & sputum culture yet to be sent. - Tobacco cessation counseled. - Urine Legionella antigen: Pending. Urinary pneumococcal antigen: Negative - Negative influenza, respiratory panel pending.  Atypical chest pain/elevated troponin - history of CAD with most recent cath in 02/2015 showing CTO of the LAD, 50% stenosis in the RPDA-1 and 70% stenosis at the RPDA-2, and 80% stenosis in the [facility]. - Per cardiology, atypical chest pain likely related to demand ischemia rather than acute MI.   Paroxysmal atrial fibrillation with RVR - Patient adamantly claims compliance to Xarelto however history not reliable. Continue digoxin - Continue xarelto. Patient also denied substance abuse/cocaine abuse but UDS positive for cocaine, opiates.  Avoiding beta blockers now.  - CHA2DS2-VASc Score 3, continue xarelto - Patient started back on Cardizem drip  today to avoid repeat episode of acute respiratory failure  Hypokalemia Replaced  Essential hypertension - BP currently stable, continue Imdur, starting Cardizem drip  Hyperlipidemia - Statins.  Type II DM - Follow A1c: 9.6. SSI. Improved control.  Tobacco abuse - Cessation counseled.  Acute kidney injury -Presented with creatinine of 1.3,  - Creatinine trending up, follow closely with diuresis  Substance abuse/cocaine - will need cessation counseling. - UDS positive for opiates and cocaine  Code  Status: full code   Family Communication: Discussed in detail with the patient, all imaging results, lab results explained to the patient    Disposition Plan: on cardizem drip  Time Spent in minutes   25 minutes  Procedures  2-D echo  Consults   Cardiology Pulmonology  DVT Prophylaxis xarelto  Medications  Scheduled Meds: . atorvastatin  80 mg Oral QPM  . azithromycin  500 mg Intravenous Q24H  . cefTRIAXone (ROCEPHIN)  IV  1 g Intravenous Q24H  . digoxin  0.125 mg Oral Daily  . furosemide  40 mg Intravenous 3 times per day  . hydrALAZINE  75 mg Oral TID  . insulin aspart  0-15 Units Subcutaneous TID WC  . insulin aspart  4 Units Subcutaneous TID WC  . insulin glargine  10 Units Subcutaneous QHS  . ipratropium-albuterol  3 mL Nebulization Q6H  . isosorbide mononitrate  30 mg Oral Daily  . pantoprazole (PROTONIX) IV  40 mg Intravenous Q24H  . potassium chloride  30 mEq Oral BID  . potassium chloride  40 mEq Oral Daily  . rivaroxaban  20 mg Oral Q supper  . spironolactone  25 mg Oral Daily   Continuous Infusions: . diltiazem (CARDIZEM) infusion     PRN Meds:.albuterol, LORazepam, morphine injection, nitroGLYCERIN   Antibiotics   Anti-infectives    Start     Dose/Rate Route Frequency Ordered Stop   10/15/15 1300  cefTRIAXone (ROCEPHIN) 1 g in dextrose 5 % 50 mL IVPB     1 g 100 mL/hr over 30 Minutes Intravenous Every 24 hours 10/15/15 1230     10/14/15 1200  vancomycin (VANCOCIN) IVPB 1000 mg/200 mL premix  Status:  Discontinued     1,000 mg 200 mL/hr over 60 Minutes Intravenous Every 12 hours 10/14/15 0031 10/15/15 1230   10/14/15 1000  piperacillin-tazobactam (ZOSYN) IVPB 3.375 g  Status:  Discontinued     3.375 g 12.5 mL/hr over 240 Minutes Intravenous 3 times per day 10/14/15 0031 10/15/15 1230   10/14/15 1000  azithromycin (ZITHROMAX) 500 mg in dextrose 5 % 250 mL IVPB     500 mg 250 mL/hr over 60 Minutes Intravenous Every 24 hours 10/14/15 0945      10/14/15 0045  piperacillin-tazobactam (ZOSYN) IVPB 3.375 g     3.375 g 100 mL/hr over 30 Minutes Intravenous STAT 10/14/15 0031 10/14/15 0152   10/14/15 0045  vancomycin (VANCOCIN) IVPB 1000 mg/200 mL premix     1,000 mg 200 mL/hr over 60 Minutes Intravenous NOW 10/14/15 0031 10/14/15 0212   10/13/15 1000  doxycycline (VIBRA-TABS) tablet 100 mg  Status:  Discontinued     100 mg Oral Every 12 hours 10/13/15 0700 10/14/15 0011   10/13/15 0715  cefTRIAXone (ROCEPHIN) 1 g in dextrose 5 % 50 mL IVPB  Status:  Discontinued     1 g 100 mL/hr over 30 Minutes Intravenous Every 24 hours 10/13/15 0700 10/14/15 0011   10/13/15 0445  doxycycline (VIBRAMYCIN) 100 mg in dextrose 5 % 250 mL IVPB  100 mg 125 mL/hr over 120 Minutes Intravenous  Once 10/13/15 0423 10/13/15 0902   10/13/15 0430  cefTRIAXone (ROCEPHIN) 1 g in dextrose 5 % 50 mL IVPB     1 g 100 mL/hr over 30 Minutes Intravenous  Once 10/13/15 0423 10/13/15 0533        Subjective:   Sean Hinton was seen and examined today.  Patient denies dizziness, chest pain, shortness of breath, abdominal pain, N/V/D/C, new weakness, numbess, tingling. hr elevated this am  Objective:   Blood pressure 124/88, pulse 95, temperature 98.3 F (36.8 C), temperature source Oral, resp. rate 18, height 5\' 10"  (1.778 m), weight 68.811 kg (151 lb 11.2 oz), SpO2 98 %.  Wt Readings from Last 3 Encounters:  10/16/15 68.811 kg (151 lb 11.2 oz)  04/16/15 86.183 kg (190 lb)  03/06/15 85.276 kg (188 lb)     Intake/Output Summary (Last 24 hours) at 10/16/15 1051 Last data filed at 10/16/15 0946  Gross per 24 hour  Intake   1640 ml  Output   1615 ml  Net     25 ml    Exam  General: Alert and oriented x 3, NAD  HEENT:  PERRLA, EOMI, Anicteric Sclera, mucous membranes moist.   Neck: Supple, no JVD, no masses  CVS: IREG IREG, tachy  Respiratory: Clear to auscultation bilaterally, no wheezing, rales or rhonchi  Abdomen: Soft, nontender,  nondistended, + bowel sounds  Ext: no cyanosis clubbing or edema  Neuro: AAOx3, Cr N's II- XII. Strength 5/5 upper and lower extremities bilaterally  Skin: No rashes  Psych: Normal affect and demeanor, alert and oriented x3    Data Review   Micro Results Recent Results (from the past 240 hour(s))  Culture, blood (routine x 2) Call MD if unable to obtain prior to antibiotics being given     Status: None (Preliminary result)   Collection Time: 10/13/15  7:00 AM  Result Value Ref Range Status   Specimen Description BLOOD RIGHT FOREARM  Final   Special Requests BOTTLES DRAWN AEROBIC AND ANAEROBIC  Final   Culture NO GROWTH 2 DAYS  Final   Report Status PENDING  Incomplete  Culture, blood (routine x 2) Call MD if unable to obtain prior to antibiotics being given     Status: None (Preliminary result)   Collection Time: 10/13/15  7:20 AM  Result Value Ref Range Status   Specimen Description BLOOD LEFT HAND  Final   Special Requests BOTTLES DRAWN AEROBIC AND ANAEROBIC  Final   Culture NO GROWTH 2 DAYS  Final   Report Status PENDING  Incomplete  MRSA PCR Screening     Status: None   Collection Time: 10/13/15  1:12 PM  Result Value Ref Range Status   MRSA by PCR NEGATIVE NEGATIVE Final    Comment:        The GeneXpert MRSA Assay (FDA approved for NASAL specimens only), is one component of a comprehensive MRSA colonization surveillance program. It is not intended to diagnose MRSA infection nor to guide or monitor treatment for MRSA infections.   Culture, sputum-assessment     Status: None   Collection Time: 10/14/15 12:43 AM  Result Value Ref Range Status   Specimen Description EXPECTORATED SPUTUM  Final   Special Requests NONE  Final   Sputum evaluation   Final    THIS SPECIMEN IS ACCEPTABLE. RESPIRATORY CULTURE REPORT TO FOLLOW.   Report Status 10/14/2015 FINAL  Final  Culture, respiratory (NON-Expectorated)  Status: None   Collection Time: 10/14/15 12:43 AM    Result Value Ref Range Status   Specimen Description EXPECTORATED SPUTUM  Final   Special Requests NONE  Final   Gram Stain   Final    FEW WBC PRESENT,BOTH PMN AND MONONUCLEAR FEW SQUAMOUS EPITHELIAL CELLS PRESENT FEW GRAM NEGATIVE RODS Performed at [facility]    Culture   Final    NORMAL OROPHARYNGEAL FLORA Performed at [facility]    Report Status 10/16/2015 FINAL  Final  MRSA PCR Screening     Status: None   Collection Time: 10/14/15 10:48 AM  Result Value Ref Range Status   MRSA by PCR NEGATIVE NEGATIVE Final    Comment:        The GeneXpert MRSA Assay (FDA approved for NASAL specimens only), is one component of a comprehensive MRSA colonization surveillance program. It is not intended to diagnose MRSA infection nor to guide or monitor treatment for MRSA infections.     Radiology Reports Dg Chest 2 View  10/15/2015  CLINICAL DATA:  Shortness of breath, cough today EXAM: CHEST  2 VIEW COMPARISON:  10/13/2015 FINDINGS: Small layering effusions bilaterally. Heart is normal size. No confluent airspace opacities or acute bony abnormality. IMPRESSION: Small bilateral layering effusions. Electronically Signed   By: Charlett Nose M.D.   On: 10/15/2015 13:10   Dg Chest 2 View  10/13/2015  CLINICAL DATA:  Initial evaluation for acute chest pain for 1 month. EXAM: CHEST  2 VIEW COMPARISON:  Prior radiograph from 02/20/2015. FINDINGS: Cardiomegaly is stable from previous study. Mediastinal silhouette within normal limits. Lung volumes within normal limits. There is parenchymal opacity within the left lower lobe, suspicious for possible pneumonia. Additional patchy infiltrate within the right perihilar region, likely within the right upper and lower lobes. Find a concerning for possible infection. Mild scattered interstitial prominence without pulmonary edema. No pleural effusion. No pneumothorax. Osseous structures demonstrate no acute abnormality. Mild multilevel  degenerative changes within the visualized spine. IMPRESSION: Patchy multi focal opacities within the left lower lobe and right perihilar region, suspicious for possible multi focal pneumonia. Electronically Signed   By: Rise Mu M.D.   On: 10/13/2015 03:49   Dg Chest Port 1 View  10/13/2015  CLINICAL DATA:  Chest pain and shortness of breath for 1 day. Fever and cough. Pneumonia. EXAM: PORTABLE CHEST 1 VIEW COMPARISON:  Earlier today FINDINGS: Progression of airspace opacity diffusely, patchy and asymmetric to the left. There were kerley lines and fissural thickening on prior chest x-ray. No effusion or air leak. Normal heart size and vascular pedicle width. IMPRESSION: Marked progression of bilateral pneumonia. Possible superimposed pulmonary edema. Electronically Signed   By: Marnee Spring M.D.   On: 10/13/2015 22:17    CBC  Recent Labs Lab 10/13/15 0247 10/14/15 0518 10/15/15 0350 10/16/15 0436  WBC 10.7* 16.4* 12.2* 10.6*  HGB 13.2 12.9* 14.5 13.7  HCT 39.5 38.9* 43.3 42.0  PLT 196 210 231 265  MCV 85.9 85.5 85.1 87.0  MCH 28.7 28.4 28.5 28.4  MCHC 33.4 33.2 33.5 32.6  RDW 13.8 13.8 14.0 14.2  LYMPHSABS  --   --  2.2  --   MONOABS  --   --  1.2*  --   EOSABS  --   --  0.2  --   BASOSABS  --   --  0.0  --     Chemistries   Recent Labs Lab 10/13/15 0247 10/13/15 1337 10/13/15 2241 10/14/15  0830 10/15/15 0350 10/16/15 0436  NA 137  --  140 139 138 139  K 4.1  --  3.9 3.9 3.2* 3.2*  CL 101  --  106 106 101 101  CO2 27  --  26 22 28 27   GLUCOSE 299*  --  128* 118* 275* 372*  BUN 19  --  20 24* 25* 20  CREATININE 1.46*  --  1.33* 1.31* 1.44* 1.52*  CALCIUM 8.8*  --  8.9 9.1 8.8* 8.5*  MG  --  1.9  --   --  1.9  --    ------------------------------------------------------------------------------------------------------------------ estimated creatinine clearance is 55.3 mL/min (by C-G formula based on Cr of  1.52). ------------------------------------------------------------------------------------------------------------------ No results for input(s): HGBA1C in the last 72 hours. ------------------------------------------------------------------------------------------------------------------ No results for input(s): CHOL, HDL, LDLCALC, TRIG, CHOLHDL, LDLDIRECT in the last 72 hours. ------------------------------------------------------------------------------------------------------------------ No results for input(s): TSH, T4TOTAL, T3FREE, THYROIDAB in the last 72 hours.  Invalid input(s): FREET3 ------------------------------------------------------------------------------------------------------------------ No results for input(s): VITAMINB12, FOLATE, FERRITIN, TIBC, IRON, RETICCTPCT in the last 72 hours.  Coagulation profile  Recent Labs Lab 10/13/15 0247  INR 1.31     Recent Labs  10/13/15 2241  DDIMER 0.56*    Cardiac Enzymes  Recent Labs Lab 10/13/15 1337 10/13/15 2241 10/14/15 0518  TROPONINI 0.16* 0.16* 0.11*   ------------------------------------------------------------------------------------------------------------------ Invalid input(s): POCBNP   Recent Labs  10/15/15 0414 10/15/15 0752 10/15/15 1156 10/15/15 1614 10/15/15 2105 10/16/15 0736  GLUCAP 248* 251* 202* 367* 340* 386*     RAI,RIPUDEEP M.D. [facility] 10/16/2015, 10:51 AM  Pager: [phone] Between 7am to 7pm - call Pager - [phone]  After 7pm go to [email or web address] - password [email or web address]  Call night coverage person covering after 7pm

## 2015-10-17 ENCOUNTER — Telehealth: Payer: Self-pay | Admitting: Cardiovascular Disease

## 2015-10-17 LAB — RESPIRATORY VIRUS PANEL
Adenovirus: NEGATIVE
INFLUENZA A: NEGATIVE
Influenza B: NEGATIVE
METAPNEUMOVIRUS: NEGATIVE
PARAINFLUENZA 2 A: NEGATIVE
PARAINFLUENZA 3 A: NEGATIVE
Parainfluenza 1: NEGATIVE
RHINOVIRUS: NEGATIVE
Respiratory Syncytial Virus A: NEGATIVE
Respiratory Syncytial Virus B: NEGATIVE

## 2015-10-17 LAB — BASIC METABOLIC PANEL
Anion gap: 8 (ref 5–15)
BUN: 25 mg/dL — ABNORMAL HIGH (ref 6–20)
CALCIUM: 9.1 mg/dL (ref 8.9–10.3)
CO2: 29 mmol/L (ref 22–32)
CREATININE: 1.4 mg/dL — AB (ref 0.61–1.24)
Chloride: 104 mmol/L (ref 101–111)
GFR calc non Af Amer: 56 mL/min — ABNORMAL LOW (ref >60)
GLUCOSE: 228 mg/dL — AB (ref 65–99)
Potassium: 3.7 mmol/L (ref 3.5–5.1)
Sodium: 141 mmol/L (ref 135–145)

## 2015-10-17 LAB — GLUCOSE, CAPILLARY
Glucose-Capillary: 228 mg/dL — ABNORMAL HIGH (ref 65–99)
Glucose-Capillary: 261 mg/dL — ABNORMAL HIGH (ref 65–99)

## 2015-10-17 LAB — CBC
HEMATOCRIT: 42.1 % (ref 39.0–52.0)
Hemoglobin: 13.9 g/dL (ref 13.0–17.0)
MCH: 28.7 pg (ref 26.0–34.0)
MCHC: 33 g/dL (ref 30.0–36.0)
MCV: 87 fL (ref 78.0–100.0)
Platelets: 266 10*3/uL (ref 150–400)
RBC: 4.84 MIL/uL (ref 4.22–5.81)
RDW: 14.4 % (ref 11.5–15.5)
WBC: 8.5 10*3/uL (ref 4.0–10.5)

## 2015-10-17 MED ORDER — DILTIAZEM HCL ER COATED BEADS 240 MG PO CP24
240.0000 mg | ORAL_CAPSULE | Freq: Every day | ORAL | Status: DC
Start: 2015-10-17 — End: 2015-10-17
  Administered 2015-10-17: 240 mg via ORAL
  Filled 2015-10-17: qty 1

## 2015-10-17 MED ORDER — GLIPIZIDE 5 MG PO TABS: 5.0000 mg | ORAL_TABLET | Freq: Two times a day (BID) | ORAL | Status: DC

## 2015-10-17 MED ORDER — AZITHROMYCIN 250 MG PO TABS
500.0000 mg | ORAL_TABLET | Freq: Every day | ORAL | Status: DC
  Administered 2015-10-17: 500 mg via ORAL
  Filled 2015-10-17: qty 2

## 2015-10-17 MED ORDER — ISOSORBIDE MONONITRATE ER 30 MG PO TB24: 30.0000 mg | ORAL_TABLET | Freq: Every day | ORAL | Status: DC

## 2015-10-17 MED ORDER — DIGOXIN 125 MCG PO TABS: 0.1250 mg | ORAL_TABLET | Freq: Every day | ORAL | Status: DC

## 2015-10-17 MED ORDER — FUROSEMIDE 40 MG PO TABS: 40.0000 mg | ORAL_TABLET | Freq: Two times a day (BID) | ORAL | Status: DC

## 2015-10-17 MED ORDER — ATORVASTATIN CALCIUM 80 MG PO TABS: 80.0000 mg | ORAL_TABLET | Freq: Every evening | ORAL | Status: AC

## 2015-10-17 MED ORDER — FUROSEMIDE 40 MG PO TABS: 40.0000 mg | ORAL_TABLET | Freq: Every day | ORAL | Status: DC

## 2015-10-17 MED ORDER — CEFUROXIME AXETIL 500 MG PO TABS: 500.0000 mg | ORAL_TABLET | Freq: Two times a day (BID) | ORAL | Status: DC

## 2015-10-17 MED ORDER — AZITHROMYCIN 500 MG PO TABS: 500.0000 mg | ORAL_TABLET | Freq: Every day | ORAL | Status: DC

## 2015-10-17 MED ORDER — IPRATROPIUM-ALBUTEROL 0.5-2.5 (3) MG/3ML IN SOLN: 3.0000 mL | Freq: Two times a day (BID) | RESPIRATORY_TRACT | Status: AC

## 2015-10-17 MED ORDER — POTASSIUM CHLORIDE CRYS ER 20 MEQ PO TBCR: 20.0000 meq | EXTENDED_RELEASE_TABLET | Freq: Every day | ORAL | Status: DC

## 2015-10-17 MED ORDER — ALBUTEROL SULFATE HFA 108 (90 BASE) MCG/ACT IN AERS: 2.0000 | INHALATION_SPRAY | Freq: Four times a day (QID) | RESPIRATORY_TRACT | Status: DC | PRN

## 2015-10-17 MED ORDER — HYDRALAZINE HCL 50 MG PO TABS: 75.0000 mg | ORAL_TABLET | Freq: Three times a day (TID) | ORAL | Status: DC

## 2015-10-17 MED ORDER — DILTIAZEM HCL ER COATED BEADS 240 MG PO CP24: 240.0000 mg | ORAL_CAPSULE | Freq: Every day | ORAL | Status: DC

## 2015-10-17 MED ORDER — CEFUROXIME AXETIL 500 MG PO TABS
500.0000 mg | ORAL_TABLET | Freq: Two times a day (BID) | ORAL | Status: DC
  Administered 2015-10-17: 500 mg via ORAL
  Filled 2015-10-17 (×3): qty 1

## 2015-10-17 NOTE — Discharge Summary (Signed)
Physician Discharge Summary   Patient ID: Sean Hinton MRN: [id number] DOB/AGE: 1961/08/29 54 y.o.  Admit date: 10/13/2015 Discharge date: 10/17/2015  Primary Care Physician:  Lora Paula, MD  Discharge Diagnoses:    . Community acquired pneumonia .  Atrial fibrillation with RVR . Essential hypertension . Hyperlipidemia . Smoking . CAD (coronary artery disease) . Elevated troponin .  Cocaine abuse . Acute on chronic systolic CHF (congestive heart failure) (HCC) . Demand ischemia [facility])  Consults:  cardiology  Recommendations for Outpatient Follow-up:  1.  home health , nebulizer arranged 2. Please repeat CBC/BMET at next visit 3.  please obtain chest x-ray in 2-3 weeks to ensure complete resolution of pneumonia 4.  beta blockers discontinued due to cocaine    DIET:  Heart healthy diet    Allergies:  No Known Allergies   DISCHARGE MEDICATIONS: Current Discharge Medication List    START taking these medications   Details  albuterol (PROVENTIL HFA;VENTOLIN HFA) 108 (90 BASE) MCG/ACT inhaler Inhale 2 puffs into the lungs every 6 (six) hours as needed for wheezing or shortness of breath. Qty: 1 Inhaler, Refills: 2    azithromycin (ZITHROMAX) 500 MG tablet Take 1 tablet (500 mg total) by mouth daily. X 7 days Qty: 7 tablet, Refills: 0    cefUROXime (CEFTIN) 500 MG tablet Take 1 tablet (500 mg total) by mouth 2 (two) times daily with a meal. X 7days Qty: 14 tablet, Refills: 0    digoxin (LANOXIN) 0.125 MG tablet Take 1 tablet (0.125 mg total) by mouth daily. Qty: 30 tablet, Refills: 3    diltiazem (CARDIZEM CD) 240 MG 24 hr capsule Take 1 capsule (240 mg total) by mouth daily. Qty: 30 capsule, Refills: 3    ipratropium-albuterol (DUONEB) 0.5-2.5 (3) MG/3ML SOLN Take 3 mLs by nebulization 2 (two) times daily. Qty: 360 mL, Refills: 10    isosorbide mononitrate (IMDUR) 30 MG 24 hr tablet Take 1 tablet (30 mg total) by mouth daily. Qty: 30 tablet,  Refills: 3    potassium chloride SA (K-DUR,KLOR-CON) 20 MEQ tablet Take 1 tablet (20 mEq total) by mouth daily. Qty: 30 tablet, Refills: 3      CONTINUE these medications which have CHANGED   Details  atorvastatin (LIPITOR) 80 MG tablet Take 1 tablet (80 mg total) by mouth every evening. Qty: 30 tablet, Refills: 3    glipiZIDE (GLUCOTROL) 5 MG tablet Take 1 tablet (5 mg total) by mouth 2 (two) times daily before a meal. Qty: 60 tablet, Refills: 3   Associated Diagnoses: Other specified diabetes mellitus without complications (HCC)    hydrALAZINE (APRESOLINE) 50 MG tablet Take 1.5 tablets (75 mg total) by mouth 3 (three) times daily. Qty: 135 tablet, Refills: 3   Associated Diagnoses: Essential hypertension      CONTINUE these medications which have NOT CHANGED   Details  furosemide (LASIX) 40 MG tablet Take 1 tablet (40 mg total) by mouth daily. Qty: 30 tablet, Refills: 3    rivaroxaban (XARELTO) 20 MG TABS tablet Take 1 tablet (20 mg total) by mouth daily with supper. Qty: 30 tablet, Refills: 3    spironolactone (ALDACTONE) 25 MG tablet Take 1 tablet (25 mg total) by mouth daily. Qty: 30 tablet, Refills: 3    Dexlansoprazole (DEXILANT) 30 MG capsule Take 1 capsule (30 mg total) by mouth daily. Qty: 90 capsule, Refills: 3   Associated Diagnoses: Gastroesophageal reflux disease, esophagitis presence not specified    nitroGLYCERIN (NITROSTAT) 0.4 MG SL tablet Place 1  tablet (0.4 mg total) under the tongue every 5 (five) minutes as needed for chest pain (up to 3 doses). Qty: 25 tablet, Refills: 3      STOP taking these medications     lisinopril (PRINIVIL,ZESTRIL) 40 MG tablet      metoprolol (LOPRESSOR) 100 MG tablet          Brief H and P: For complete details please refer to admission H and P, but in brief9 year old male patient with history of CAD, ischemic CM (LVEF 40-45 percent in April 2016, chronic systolic CHF, PAF on xarelto, HTN, HLD, tobacco abuse, DM 2  presented to [facility] ED on 10/13/15 for precordial pressure type of chest pain-worse with activity, followed several days later by productive cough and dyspnea. In the ED, chest x-ray showed multiple patchy opacities suggestive of pneumonia, tachypnea, tachycardia, glucose 299 and troponin of 0.22. Cardiology consulted. Initially managed on stepdown unit.  Overnight 12/19, anxiety/agitation, worsening dyspnea, chest pain and chest x-ray showed worsening infiltrates. Placed on BiPAP. CCM consulted and transferred to ICU under their care on 12/20. Patient back to the floor on 12/21  Hospital Course:  Acute hypoxic respiratory failure, multifactorial secondary to community-acquired pneumonia, atrial fibrillation with RVR, acute on chronic CHF Patient presented to ED with chest pain, worse with activity , productive cough and dyspnea. Chest x-ray showed multiple patchy opacities suggestive of pneumonia. troponins were also positive. Patient developed worsening dyspnea, hypoxia and infiltrates on chest x-ray overnight 12/19.  Patient needed BiPAP Critical care service evaluated the patient and transferred patient to ICU under their care on 12/20. However patient improved after diuresis and removal of almost 5 L of fluids, patient was transferred back to the floor, O2 sats 98% on room air. Elevated D dimers (likely secondary to infection), no suspicion for PE as patient reported compliance to Xarelto. He is stable from cardiology standpoint to be discharged home. Continue Zithromax, Ceftin for 7 days.  Acute on chronic systolic CHF  - 2-D echo showed LVEF is 35-40% with severe hypokinesis of apical and anterior septal myocardium. -BNP is elevated at 1219, patient chest x-ray worsened with possible pulmonary edema. Patient was placed on aggressive IV diuresis. Patient has negative balance of 4.5 L and -Continue aggressive IV diuresis, negative balance of 4.5 L, weigth down from 190 on abmit -> 154 at the time of  discharge.  He is stable from cardiology standpoint to be discharged home.  Community acquired pneumonia-bacterial versus viral - Started empirically on IV Rocephin and doxycycline. Switched to Zosyn and vancomycin. Switched back to Rocephin/azithromycin. He will need follow-up chest x-ray in 2-3 weeks to ensure resolution of pneumonia findings - Lactate: Normal, blood cultures 2 negative so far. Urine strep antigen, urine legionella antigen negative. - Tobacco cessation counseled. - Negative influenza, sputum culture with normal oropharyngeal flora.  Atypical chest pain/elevated troponin - history of CAD with most recent cath in 02/2015 showing CTO of the LAD, 50% stenosis in the RPDA-1 and 70% stenosis at the RPDA-2, and 80% stenosis in the [facility]. - Per cardiology, atypical chest pain likely related to demand ischemia rather than acute MI.   Paroxysmal atrial fibrillation with RVR - Patient adamantly claims compliance to Xarelto however history not reliable. Continue digoxin - Continue xarelto. Patient also denied substance abuse/cocaine abuse but UDS positive for cocaine, opiates. Avoiding beta blockers now.  - CHA2DS2-VASc Score 3, continue xarelto - Patient was started back on Cardizem drip when patient went back into the atrial fib  with RVR on 12/22, now transitioned to oral Cardizem , not a candidate for beta blocker due to cocaine use.  Hypokalemia Replaced  Essential hypertension - BP currently stable, continue Imdur,  Cardizem  Hyperlipidemia - Statins.  Type II DM - Follow A1c: 9.6. SSI. Improved control.  Tobacco abuse - Cessation counseled.  Acute kidney injury -Presented with creatinine of 1.3,  - Creatinine trending up, follow closely with diuresis  Substance abuse/cocaine - will need cessation counseling. - UDS positive for opiates and cocaine   Day of Discharge BP 138/86 mmHg  Pulse 87  Temp(Src) 97.9 F (36.6 C) (Oral)  Resp 18  Ht 5\' 10"   (1.778 m)  Wt 70.126 kg (154 lb 9.6 oz)  BMI 22.18 kg/m2  SpO2 98%  Physical Exam: General: Alert and awake oriented x3 not in any acute distress. HEENT: anicteric sclera, pupils reactive to light and accommodation CVS: S1-S2 clear no murmur rubs or gallops Chest: clear to auscultation bilaterally, no wheezing rales or rhonchi Abdomen: soft nontender, nondistended, normal bowel sounds Extremities: no cyanosis, clubbing or edema noted bilaterally Neuro: Cranial nerves II-XII intact, no focal neurological deficits   The results of significant diagnostics from this hospitalization (including imaging, microbiology, ancillary and laboratory) are listed below for reference.    LAB RESULTS: Basic Metabolic Panel:  Recent Labs Lab 10/15/15 0350 10/16/15 0436 10/17/15 0440  NA 138 139 141  K 3.2* 3.2* 3.7  CL 101 101 104  CO2 28 27 29   GLUCOSE 275* 372* 228*  BUN 25* 20 25*  CREATININE 1.44* 1.52* 1.40*  CALCIUM 8.8* 8.5* 9.1  MG 1.9  --   --   PHOS 3.3  --   --    Liver Function Tests: No results for input(s): AST, ALT, ALKPHOS, BILITOT, PROT, ALBUMIN in the last 168 hours. No results for input(s): LIPASE, AMYLASE in the last 168 hours. No results for input(s): AMMONIA in the last 168 hours. CBC:  Recent Labs Lab 10/15/15 0350 10/16/15 0436 10/17/15 0440  WBC 12.2* 10.6* 8.5  NEUTROABS 8.6*  --   --   HGB 14.5 13.7 13.9  HCT 43.3 42.0 42.1  MCV 85.1 87.0 87.0  PLT 231 265 266   Cardiac Enzymes:  Recent Labs Lab 10/13/15 2241 10/14/15 0518  TROPONINI 0.16* 0.11*   BNP: Invalid input(s): POCBNP CBG:  Recent Labs Lab 10/16/15 2056 10/17/15 0750  GLUCAP 351* 228*    Significant Diagnostic Studies:  Dg Chest 2 View  10/13/2015  CLINICAL DATA:  Initial evaluation for acute chest pain for 1 month. EXAM: CHEST  2 VIEW COMPARISON:  Prior radiograph from 02/20/2015. FINDINGS: Cardiomegaly is stable from previous study. Mediastinal silhouette within normal  limits. Lung volumes within normal limits. There is parenchymal opacity within the left lower lobe, suspicious for possible pneumonia. Additional patchy infiltrate within the right perihilar region, likely within the right upper and lower lobes. Find a concerning for possible infection. Mild scattered interstitial prominence without pulmonary edema. No pleural effusion. No pneumothorax. Osseous structures demonstrate no acute abnormality. Mild multilevel degenerative changes within the visualized spine. IMPRESSION: Patchy multi focal opacities within the left lower lobe and right perihilar region, suspicious for possible multi focal pneumonia. Electronically Signed   By: 10/15/2015 M.D.   On: 10/13/2015 03:49   Dg Chest Port 1 View  10/13/2015  CLINICAL DATA:  Chest pain and shortness of breath for 1 day. Fever and cough. Pneumonia. EXAM: PORTABLE CHEST 1 VIEW COMPARISON:  Earlier today FINDINGS:  Progression of airspace opacity diffusely, patchy and asymmetric to the left. There were kerley lines and fissural thickening on prior chest x-ray. No effusion or air leak. Normal heart size and vascular pedicle width. IMPRESSION: Marked progression of bilateral pneumonia. Possible superimposed pulmonary edema. Electronically Signed   By: Marnee Spring M.D.   On: 10/13/2015 22:17    2D ECHO: Study Conclusions  - Left ventricle: The cavity size was normal. Wall thickness was increased in a pattern of mild LVH. Systolic function was moderately reduced. The estimated ejection fraction was in the range of 35% to 40%. Severe hypokinesis of the apicalanteroseptal and apical myocardium. - Mitral valve: There was mild regurgitation. - Left atrium: The atrium was mildly dilated. - Right ventricle: The cavity size was normal. Wall thickness was mildly increased.  Disposition and Follow-up: Discharge Instructions    (HEART FAILURE PATIENTS) Call MD:  Anytime you have any of the following  symptoms: 1) 3 pound weight gain in 24 hours or 5 pounds in 1 week 2) shortness of breath, with or without a dry hacking cough 3) swelling in the hands, feet or stomach 4) if you have to sleep on extra pillows at night in order to breathe.    Complete by:  As directed      Diet Carb Modified    Complete by:  As directed      Increase activity slowly    Complete by:  As directed             DISPOSITION:  Home    DISCHARGE FOLLOW-UP Follow-up Information    Follow up with [facility] On 10/28/2015.   Why:  Transitional Care Clinic appointment on 10/28/15 at 11:00 am with Dr. Venetia Night.   Contact information:   [address] [place] [address] [phone]      Follow up with [facility].   Why:  Community education officer.   Contact information:   [address] [place] [address] [phone]       Follow up with Abelino Derrick, PA-C On 10/23/2015.   Specialties:  Cardiology, Radiology   Why:  Cardiology Hospital Follow-Up on 10/23/2015 at 9:30AM.   Contact information:   [address] [place] [address] [phone]        Time spent on Discharge:  35 minutes  Signed:   Tavi Hoogendoorn M.D. [facility] 10/17/2015, 10:56 AM Pager: [phone]

## 2015-10-17 NOTE — Telephone Encounter (Signed)
Patient is still inpatient at [facility] on 10/17/15

## 2015-10-17 NOTE — Telephone Encounter (Signed)
TCM per B Strader  12/29 @ 9:30 am w/Luke @ [facility]

## 2015-10-17 NOTE — Plan of Care (Signed)
Problem: Activity: Goal: Risk for activity intolerance will decrease Outcome: Progressing Pt a/o x 3 able to communicate needs and discomforts. Pt has not c/o pain at this time. Will continue to monitor pt status and provide emotional support as needed

## 2015-10-17 NOTE — Progress Notes (Signed)
Inpatient Diabetes Program Recommendations  AACE/ADA: New Consensus Statement on Inpatient Glycemic Control (2015)  Target Ranges:  Prepandial:   less than 140 mg/dL      Peak postprandial:   less than 180 mg/dL (1-2 hours)      Critically ill patients:  140 - 180 mg/dL   Review of Glycemic Control  Results for Sean Hinton, Sean Hinton (MRN [id number]) as of 10/17/2015 10:38  Ref. Range 10/16/2015 07:36 10/16/2015 11:45 10/16/2015 17:04 10/16/2015 20:56 10/17/2015 07:50  Glucose-Capillary Latest Ref Range: 65-99 mg/dL 403 (H) 474 (H) 259 (H) 351 (H) 228 (H)    Diabetes history: type 2, A1C 9.6% on 10/13/15  Outpatient Diabetes medications: ordered Glipizide 5mg  bid but history indicates patient has not been taking Current orders for Inpatient glycemic control:   Inpatient Diabetes Program Recommendations: Fasting blood sugar 228mg /dl- consider increasing Lantus to 20 units qhs (0.3 units/kg) Consider adding Novolog correction 0-5 units qhs Consider increasing Novolog mealtime insulin to 5 units tid  , RN, , Susette Racer, CDE Diabetes Coordinator Inpatient Diabetes Program  [phone] (Team Pager) [phone] [facility] Office) 10/17/2015 10:45 AM

## 2015-10-17 NOTE — Progress Notes (Signed)
Nutrition Education Note  RD consulted for nutrition education regarding a CHO-modified and Heart Healthy diet.   Lipid Panel     Component Value Date/Time   CHOL 128 10/13/2015 0726   TRIG 67 10/13/2015 0726   HDL 35* 10/13/2015 0726   CHOLHDL 3.7 10/13/2015 0726   VLDL 13 10/13/2015 0726   LDLCALC 80 10/13/2015 0726    RD provided "Heart Healthy Nutrition Therapy" handout from the Academy of Nutrition and Dietetics. Patient reports he had heart healthy education during his last visit a few months ago. Discouraged intake of processed foods and use of salt shaker. Encouraged fresh fruits and vegetables as well as whole grain sources of carbohydrates to maximize fiber intake.   Also reviewed CHO modified guidelines, using the plate method handout. Discussed appropriate portion sizes and CHO content of foods. Teach back method used.  Expect fair compliance.  Body mass index is 22.18 kg/(m^2). Pt meets criteria for normal weight based on current BMI.  Current diet order is heart healthy CHO modified, patient is consuming approximately 100% of meals and snacks at this time. Labs and medications reviewed. No further nutrition interventions warranted at this time. RD contact information provided. If additional nutrition issues arise, please re-consult RD.  Joaquin Courts, RD, LDN, CNSC Pager [phone] After Hours Pager [phone]

## 2015-10-17 NOTE — Care Management Note (Addendum)
Case Management Note  Patient Details  Name: Sean Hinton MRN: [id number] Date of Birth: 1961/10/21  Subjective/Objective: Pt admitted for CAP- Plan for home today with [employer]. [facility] to deliver to room.                    Action/Plan: No further needs from CM at this time.   Expected Discharge Date:                  Expected Discharge Plan:  Home/Self Care  In-House Referral:  NA  Discharge planning Services  CM Consult, Follow-up appt scheduled, [facility]  Post Acute Care Choice:  Durable Medical Equipment Choice offered to:  Patient  DME Arranged:  Nebulizer machine DME Agency:  [facility]  HH Arranged:  NA HH Agency:  NA  Status of Service:  Completed, signed off  Medicare Important Message Given:    Date Medicare IM Given:    Medicare IM give by:    Date Additional Medicare IM Given:    Additional Medicare Important Message give by:     If discussed at Long Length of Stay Meetings, dates discussed:    Additional Comments:[facility] pharmacy was closed once pt was d/c MATCH Letter utilized and pt will need to take to [facility] outpatient pharmacy vs [facility] [address]. No further needs at this time. 10-17-15 1454   Gala Lewandowsky, RN 10/17/2015, 9:23 AM

## 2015-10-17 NOTE — Progress Notes (Signed)
Patient Name: Sean Hinton Date of Encounter: 10/17/2015  Principal Problem:   Community acquired pneumonia Active Problems:   Essential hypertension   Hyperlipidemia   Smoking   Diabetes mellitus without complication (HCC)   CAD (coronary artery disease)   Elevated troponin   Cough   Chest pain   CAP (community acquired pneumonia)   Acute respiratory failure with hypoxia (HCC)   Acute on chronic systolic CHF (congestive heart failure) (HCC)   Demand ischemia [facility])   Anxiety   Primary Cardiologist: Dr. Royann Shivers Patient Profile: 54 y.o. male with past medical history of CAD (CTO in LAD, ostial 80%, RPDA-1 50%), ischemic cardiomyopathy (EF 40-45% in 01/2015) , PAF (on Xarelto), HTN, HLD, substance abuse (Cocaine) and tobacco abuse who presented to [facility] ED on 10/13/2015 for chest pain for the past two weeks, accompanied by shortness of breath and a productive cough. Had acute respiratory failure on 10/14/2015.  SUBJECTIVE: Reports feeling great. No SOB. Not aware of atrial flutter.  Denies any chest pain or palpitations. Very anxious to go home.  OBJECTIVE Filed Vitals:   10/16/15 2050 10/17/15 0027 10/17/15 0500 10/17/15 0818  BP: 131/76 120/71 138/86   Pulse: 97 85 87   Temp: 99.3 F (37.4 C) 98.9 F (37.2 C) 97.9 F (36.6 C)   TempSrc: Oral Oral Oral   Resp: 18 18 18    Height:      Weight:   70.126 kg (154 lb 9.6 oz)   SpO2: 100% 100% 100% 98%    Intake/Output Summary (Last 24 hours) at 10/17/15 1000 Last data filed at 10/17/15 0700  Gross per 24 hour  Intake 2322.54 ml  Output   2100 ml  Net 222.54 ml   Filed Weights   10/14/15 0445 10/16/15 0500 10/17/15 0500  Weight: 73.71 kg (162 lb 8 oz) 68.811 kg (151 lb 11.2 oz) 70.126 kg (154 lb 9.6 oz)    PHYSICAL EXAM General: Thin African American  male in no acute distress. Head: Normocephalic, atraumatic.  Neck: Supple without bruits, JVD not elevated. Lungs:  Resp regular and unlabored,  Clear Heart: Irregularly irregular, S1, S2, no S3, S4, or murmur; no rub. Abdomen: Soft, non-tender, non-distended with normoactive bowel sounds. No hepatomegaly. No rebound/guarding. No obvious abdominal masses. Extremities: No clubbing, cyanosis, or edema. Distal pedal pulses are 2+ bilaterally. Neuro: Alert and oriented X 3. Moves all extremities spontaneously. Psych: Normal affect.  LABS: CBC:  Recent Labs  10/15/15 0350 10/16/15 0436 10/17/15 0440  WBC 12.2* 10.6* 8.5  NEUTROABS 8.6*  --   --   HGB 14.5 13.7 13.9  HCT 43.3 42.0 42.1  MCV 85.1 87.0 87.0  PLT 231 265 266   INR:No results for input(s): INR in the last 72 hours. Basic Metabolic Panel:  Recent Labs  10/19/15 0350 10/16/15 0436 10/17/15 0440  NA 138 139 141  K 3.2* 3.2* 3.7  CL 101 101 104  CO2 28 27 29   GLUCOSE 275* 372* 228*  BUN 25* 20 25*  CREATININE 1.44* 1.52* 1.40*  CALCIUM 8.8* 8.5* 9.1  MG 1.9  --   --   PHOS 3.3  --   --     Cardiac Enzymes: No results for input(s): CKTOTAL, CKMB, CKMBINDEX, TROPONINI in the last 72 hours. No results for input(s): TROPIPOC in the last 72 hours. BNP:  B NATRIURETIC PEPTIDE  Date/Time Value Ref Range Status  10/15/2015 03:50 AM 606.7* 0.0 - 100.0 pg/mL Final  10/14/2015 08:30 AM 1219.9*  0.0 - 100.0 pg/mL Final   No results found for: PROBNP D-dimer: No results for input(s): DDIMER in the last 72 hours.  TELE:   NSR. Some episodes of atrial flutter with RVR. Correlates with him getting upset with girlfriend.   ECHO: 10/14/2015 Study Conclusions - Left ventricle: The cavity size was normal. Wall thickness was increased in a pattern of mild LVH. Systolic function was moderately reduced. The estimated ejection fraction was in the range of 35% to 40%. Severe hypokinesis of the apicalanteroseptal and apical myocardium. - Mitral valve: There was mild regurgitation. - Left atrium: The atrium was mildly dilated. - Right ventricle: The cavity  size was normal. Wall thickness was mildly increased.  Radiology/Studies: Dg Chest Port 1 View: 10/13/2015  CLINICAL DATA:  Chest pain and shortness of breath for 1 day. Fever and cough. Pneumonia. EXAM: PORTABLE CHEST 1 VIEW COMPARISON:  Earlier today FINDINGS: Progression of airspace opacity diffusely, patchy and asymmetric to the left. There were kerley lines and fissural thickening on prior chest x-ray. No effusion or air leak. Normal heart size and vascular pedicle width. IMPRESSION: Marked progression of bilateral pneumonia. Possible superimposed pulmonary edema. Electronically Signed   By: Marnee Spring M.D.   On: 10/13/2015 22:17     Current Medications:  . atorvastatin  80 mg Oral QPM  . azithromycin  500 mg Oral Daily  . cefUROXime  500 mg Oral BID WC  . digoxin  0.125 mg Oral Daily  . diltiazem  240 mg Oral Daily  . furosemide  40 mg Oral BID  . hydrALAZINE  75 mg Oral TID  . insulin aspart  0-15 Units Subcutaneous TID WC  . insulin aspart  4 Units Subcutaneous TID WC  . insulin glargine  10 Units Subcutaneous QHS  . ipratropium-albuterol  3 mL Nebulization BID  . isosorbide mononitrate  30 mg Oral Daily  . pantoprazole (PROTONIX) IV  40 mg Intravenous Q24H  . potassium chloride  30 mEq Oral BID  . potassium chloride  40 mEq Oral Daily  . rivaroxaban  20 mg Oral Q supper  . spironolactone  25 mg Oral Daily      ASSESSMENT AND PLAN:  1. Acute Respiratory Failure - most likely consistent with multilobar PNA. Did some acute on chronic CHF. BNP elevated to 1219.  Net output -4.8L so far. - Reports his weight has been between 165lbs - 170lbs over the recent weeks. 154 lbs today. - continue Aldactone, Nitrates, Hydralazine, and Lasix. Will switch lasix to po. Was on 40 mg daily PTA. No BB seconday to cocaine abuse.   2. CAP - per admitting team  3. Acute on Chronic Systolic CHF - repeat echo shows EF of 35-40%.  - BNP elevated to 1219. - refer to #1  4. Chest  Pain with Typical and Atypical Features - history of CAD with most recent cath in 02/2015 showing CTO of the LAD, 50% stenosis in the RPDA-1 and 70% stenosis at the RPDA-2, and 80% stenosis in the [facility]. - chest pain is worse with inspiration and relieved with gas relief tablets which makes it atypical yet he reports it has an exertional quality.  - cyclic troponin values have been 0.19, 0.16, 0.16, and 0.11. Consistent with demand ischemia.  5. Paroxysmal Atrial Flutter - This patients CHA2DS2-VASc Score and unadjusted Ischemic Stroke Rate (% per year) is equal to 3.2 % stroke rate/year from a score of 3 (HTN, CHF, Vascular). Continue Xarelto. - Noted to have  intermittent but not sustained Atrial flutter with RVR. He is asymptomatic. Will start Cardizem CD 240 mg daily. Not a candidate for beta blocker due to cocaine abuse.   6. Substance Abuse - cocaine and tobacco abuse.  - counseled on cessation. - avoid BB.  From a cardiac standpoint I think he is stable for DC later today.  Signed,  Terrianne Cavness [place], MDFACC 10/17/2015 10:00 AM

## 2015-10-18 LAB — CULTURE, BLOOD (ROUTINE X 2)
Culture: NO GROWTH
Culture: NO GROWTH

## 2015-10-21 ENCOUNTER — Telehealth: Payer: Self-pay

## 2015-10-21 NOTE — Telephone Encounter (Signed)
[facility] Post-discharge Follow-Up Phone Call: Attempt #1  Date of Discharge:10/17/15 Principal Discharge Diagnosis(es): Community acquired pneumonia, atrial fibrillation with RVR; HTN, HLD, CAD, CHF, cocaine use Post-discharge Communication: Call placed to the patient.  Alona Bene, answered the phone and took a message requesting the patient return the call to # [phone] or [phone].  She stated that she would have the patient return the call.  Call Completed: No

## 2015-10-21 NOTE — Telephone Encounter (Signed)
Patient contacted regarding discharge from [facility] on 10/17/2015.  Patient understands to follow up with provider Corine Shelter on 10/23/2015 at 09/30 am at [facility]. Patient understands discharge instructions? Yes Patient understands medications and regiment? Yes Patient understands to bring all medications to this visit? Yes

## 2015-10-21 NOTE — Telephone Encounter (Signed)
error 

## 2015-10-22 ENCOUNTER — Telehealth: Payer: Self-pay

## 2015-10-22 NOTE — Telephone Encounter (Signed)
[facility] Clinic Post-discharge Follow-Up Phone Call:  Attempt #2  Date of Discharge: 10/17/2015  Post-discharge Communication:  Call placed to # [phone] (home) and Alona Bene answered and stated that the patient is not home.  She said that she had the # for the [facility] clinic on the caller ID and would have the patient return the call. This CM offered to give her the CM cell phone # but she stated that she was not able to write it down at this time and would have him call the clinic #.  Call Completed: No

## 2015-10-23 ENCOUNTER — Ambulatory Visit: Payer: Self-pay | Admitting: Cardiology

## 2015-10-23 ENCOUNTER — Telehealth: Payer: Self-pay

## 2015-10-23 NOTE — Telephone Encounter (Signed)
[facility] Clinic Post-discharge Follow-Up Phone Call:  Date of Discharge: 10/17/15 Principal Discharge Diagnosis(es): Community acquired pneumonia, acute on chronic systolic CHF, atrial fibrillation with RVR. Post-discharge Communication: Attempt #3 to contact patient and complete discharge follow-up phone call. Call placed to patient's home number [phone]). Patient unavailable; HIPPA compliant message left requesting return call. In addition call placed to #[phone]. [name], fiance, answered phone; patient unavailable. HIPPA compliant message left requesting patient return call.  Call Completed: No

## 2015-10-24 ENCOUNTER — Telehealth: Payer: Self-pay

## 2015-10-24 DIAGNOSIS — E119 Type 2 diabetes mellitus without complications: Secondary | ICD-10-CM

## 2015-10-24 MED ORDER — TRUE METRIX METER DEVI: 1.0000 | Freq: Three times a day (TID) | Status: DC

## 2015-10-24 MED ORDER — GLUCOSE BLOOD VI STRP: ORAL_STRIP | Status: DC

## 2015-10-24 MED ORDER — TRUEPLUS LANCETS 28G MISC: 1.0000 | Freq: Three times a day (TID) | Status: DC

## 2015-10-24 NOTE — Telephone Encounter (Signed)
[facility] Clinic Post-discharge Follow-Up Phone Call:  Date of Discharge: 10/17/15 Principal Discharge Diagnosis(es): Community acquired pneumonia, atrial fibrillation with RVR, acute on chronic systolic CHF Post-discharge Communication: Return call received from patient and post-discharge follow-up phone call completed. Call Completed: Yes                    With Whom: Patient    Please check all that apply:  X  Patient is knowledgeable of his/her condition(s) and/or treatment. X  Patient is caring for self at home.  ? Patient is receiving assist at home from family and/or caregiver. Family and/or caregiver is knowledgeable of patient's condition(s) and/or treatment. ? Patient is receiving home health services. If so, name of agency.     Medication Reconciliation:  X  Medication list reviewed with patient. X  Patient obtained all discharge medications. Patient's medication list thoroughly reviewed. Patient indicated he has all medications and is taking them as prescribed. Patient has an Teacher, music. He indicated he has enough test strips to last until his appointment on 10/28/15; however, per [facility] pharmacy, patient would benefit from a new True Metrix meter as pharmacy does not have his current test strips in stock.  New True Metrix meter and diabetes supplies ordered per protocol. Patient indicated he would pick-up new meter and diabetes supplies at his appointment on 10/28/15.   Activities of Daily Living:  X  Independent ? Needs assist  ? Total Care    Community resources in place for patient:  X  None  ? Home Health/Home DME ? Assisted Living ? Support Group          Patient Education: Discussed importance of keeping blood glucose log and bringing blood sugar log as well as all medications to his appointment. In addition, discussed importance of daily weights and calling Cardiologist if he has 3 pound weight gain in 24  hours or 5 pound weight gain in one week. Patient verbalized understanding. He indicated he may need a new scale. Also discussed importance of rescheduling his Cardiology appointment as patient indicated he missed his appointment on 10/23/15.  Patient verbalized understanding. Patient reminded of [facility] appointment on 10/28/15 at 1100 with Dr. Venetia Night. Patient aware of appointment and indicated he will have transportation. No additional needs/concerns identified.

## 2015-10-24 NOTE — Telephone Encounter (Signed)
[facility] Clinic Post-discharge Follow-Up Phone Call:  Date of Discharge: 10/17/15 Principal Discharge Diagnosis(es): Community acquired pneumonia, acute on chronic systolic CHF, atrial fibrillation with RVR Post-discharge Communication: Attempt #4 to contact patient and complete discharge follow-up phone call. Call placed to #[phone]. Unable to reach patient. HIPPA compliant message left requesting return call. Call completed: No

## 2015-10-26 DIAGNOSIS — I214 Non-ST elevation (NSTEMI) myocardial infarction: Secondary | ICD-10-CM

## 2015-10-26 HISTORY — DX: Non-ST elevation (NSTEMI) myocardial infarction: I21.4

## 2015-10-28 ENCOUNTER — Encounter: Payer: Self-pay | Admitting: Family Medicine

## 2015-10-28 ENCOUNTER — Ambulatory Visit: Payer: Self-pay | Admitting: Nurse Practitioner

## 2015-10-28 ENCOUNTER — Encounter: Payer: Self-pay | Admitting: Nurse Practitioner

## 2015-10-28 ENCOUNTER — Ambulatory Visit: Payer: Self-pay | Attending: Family Medicine | Admitting: Family Medicine

## 2015-10-28 VITALS — BP 151/106 | HR 105 | Temp 98.9°F | Resp 13 | Ht 70.0 in | Wt 169.2 lb

## 2015-10-28 DIAGNOSIS — E119 Type 2 diabetes mellitus without complications: Secondary | ICD-10-CM

## 2015-10-28 DIAGNOSIS — I10 Essential (primary) hypertension: Secondary | ICD-10-CM

## 2015-10-28 DIAGNOSIS — I25118 Atherosclerotic heart disease of native coronary artery with other forms of angina pectoris: Secondary | ICD-10-CM

## 2015-10-28 DIAGNOSIS — Z720 Tobacco use: Secondary | ICD-10-CM

## 2015-10-28 DIAGNOSIS — I5023 Acute on chronic systolic (congestive) heart failure: Secondary | ICD-10-CM

## 2015-10-28 DIAGNOSIS — R748 Abnormal levels of other serum enzymes: Secondary | ICD-10-CM | POA: Insufficient documentation

## 2015-10-28 DIAGNOSIS — I4892 Unspecified atrial flutter: Secondary | ICD-10-CM

## 2015-10-28 DIAGNOSIS — Z7901 Long term (current) use of anticoagulants: Secondary | ICD-10-CM | POA: Insufficient documentation

## 2015-10-28 DIAGNOSIS — F17210 Nicotine dependence, cigarettes, uncomplicated: Secondary | ICD-10-CM | POA: Insufficient documentation

## 2015-10-28 DIAGNOSIS — I2510 Atherosclerotic heart disease of native coronary artery without angina pectoris: Secondary | ICD-10-CM | POA: Insufficient documentation

## 2015-10-28 DIAGNOSIS — E785 Hyperlipidemia, unspecified: Secondary | ICD-10-CM | POA: Insufficient documentation

## 2015-10-28 DIAGNOSIS — I4891 Unspecified atrial fibrillation: Secondary | ICD-10-CM

## 2015-10-28 DIAGNOSIS — Z7984 Long term (current) use of oral hypoglycemic drugs: Secondary | ICD-10-CM | POA: Insufficient documentation

## 2015-10-28 LAB — BASIC METABOLIC PANEL
BUN: 20 mg/dL (ref 7–25)
CALCIUM: 8.5 mg/dL — AB (ref 8.6–10.3)
CO2: 25 mmol/L (ref 20–31)
CREATININE: 1.14 mg/dL (ref 0.70–1.33)
Chloride: 109 mmol/L (ref 98–110)
GLUCOSE: 238 mg/dL — AB (ref 65–99)
POTASSIUM: 3.8 mmol/L (ref 3.5–5.3)
Sodium: 143 mmol/L (ref 135–146)

## 2015-10-28 LAB — GLUCOSE, POCT (MANUAL RESULT ENTRY): POC Glucose: 275 mg/dl — AB (ref 70–99)

## 2015-10-28 NOTE — Patient Instructions (Signed)
Smoking Cessation, Tips for Success If you are ready to quit smoking, congratulations! You have chosen to help yourself be healthier. Cigarettes bring nicotine, tar, carbon monoxide, and other irritants into your body. Your lungs, heart, and blood vessels will be able to work better without these poisons. There are many different ways to quit smoking. Nicotine gum, nicotine patches, a nicotine inhaler, or nicotine nasal spray can help with physical craving. Hypnosis, support groups, and medicines help break the habit of smoking. WHAT THINGS CAN I DO TO MAKE QUITTING EASIER?  Here are some tips to help you quit for good:  Pick a date when you will quit smoking completely. Tell all of your friends and family about your plan to quit on that date.  Do not try to slowly cut down on the number of cigarettes you are smoking. Pick a quit date and quit smoking completely starting on that day.  Throw away all cigarettes.   Clean and remove all ashtrays from your home, work, and car.  On a card, write down your reasons for quitting. Carry the card with you and read it when you get the urge to smoke.  Cleanse your body of nicotine. Drink enough water and fluids to keep your urine clear or pale yellow. Do this after quitting to flush the nicotine from your body.  Learn to predict your moods. Do not let a bad situation be your excuse to have a cigarette. Some situations in your life might tempt you into wanting a cigarette.  Never have "just one" cigarette. It leads to wanting another and another. Remind yourself of your decision to quit.  Change habits associated with smoking. If you smoked while driving or when feeling stressed, try other activities to replace smoking. Stand up when drinking your coffee. Brush your teeth after eating. Sit in a different chair when you read the paper. Avoid alcohol while trying to quit, and try to drink fewer caffeinated beverages. Alcohol and caffeine may urge you to  smoke.  Avoid foods and drinks that can trigger a desire to smoke, such as sugary or spicy foods and alcohol.  Ask people who smoke not to smoke around you.  Have something planned to do right after eating or having a cup of coffee. For example, plan to take a walk or exercise.  Try a relaxation exercise to calm you down and decrease your stress. Remember, you may be tense and nervous for the first 2 weeks after you quit, but this will pass.  Find new activities to keep your hands busy. Play with a pen, coin, or rubber band. Doodle or draw things on paper.  Brush your teeth right after eating. This will help cut down on the craving for the taste of tobacco after meals. You can also try mouthwash.   Use oral substitutes in place of cigarettes. Try using lemon drops, carrots, cinnamon sticks, or chewing gum. Keep them handy so they are available when you have the urge to smoke.  When you have the urge to smoke, try deep breathing.  Designate your home as a nonsmoking area.  If you are a heavy smoker, ask your health care provider about a prescription for nicotine chewing gum. It can ease your withdrawal from nicotine.  Reward yourself. Set aside the cigarette money you save and buy yourself something nice.  Look for support from others. Join a support group or smoking cessation program. Ask someone at home or at work to help you with your plan   to quit smoking.  Always ask yourself, "Do I need this cigarette or is this just a reflex?" Tell yourself, "Today, I choose not to smoke," or "I do not want to smoke." You are reminding yourself of your decision to quit.  Do not replace cigarette smoking with electronic cigarettes (commonly called e-cigarettes). The safety of e-cigarettes is unknown, and some may contain harmful chemicals.  If you relapse, do not give up! Plan ahead and think about what you will do the next time you get the urge to smoke. HOW WILL I FEEL WHEN I QUIT SMOKING? You  may have symptoms of withdrawal because your body is used to nicotine (the addictive substance in cigarettes). You may crave cigarettes, be irritable, feel very hungry, cough often, get headaches, or have difficulty concentrating. The withdrawal symptoms are only temporary. They are strongest when you first quit but will go away within 10-14 days. When withdrawal symptoms occur, stay in control. Think about your reasons for quitting. Remind yourself that these are signs that your body is healing and getting used to being without cigarettes. Remember that withdrawal symptoms are easier to treat than the major diseases that smoking can cause.  Even after the withdrawal is over, expect periodic urges to smoke. However, these cravings are generally short lived and will go away whether you smoke or not. Do not smoke! WHAT RESOURCES ARE AVAILABLE TO HELP ME QUIT SMOKING? Your health care provider can direct you to community resources or hospitals for support, which may include:  Group support.  Education.  Hypnosis.  Therapy.   This information is not intended to replace advice given to you by your health care provider. Make sure you discuss any questions you have with your health care provider.   Document Released: 07/09/2004 Document Revised: 11/01/2014 Document Reviewed: 03/29/2013 Elsevier Interactive Patient Education 2016 [employer]  

## 2015-10-28 NOTE — Progress Notes (Signed)
Patient here for follow up after recent admission to hospital He reports no acute pain He is smoking 3 cigarettes per day and denies ETOH or illegal drugs He states taking medications about 1 hour ago He states he has almost finished taking all antibiotics

## 2015-10-28 NOTE — Progress Notes (Signed)
TRANSITIONAL CARE CLINIC  Date of telephone encounter: 10/20/15  Admit date: 10/13/15 Discharge date: 10/17/15  PCP: Dr. Armen Pickup  Subjective:  Patient ID: Sean Hinton, male    DOB: [birth year]  Age: 55 y.o. MRN: [id number]  CC: Hospitalization Follow-up   HPI Sean Hinton is a 55 year old male with a history of systolic heart failure (EF 35-40%, atrial fibrillation (on anticoagulation with Xarelto), coronary artery disease, hypertension, diabetes mellitus (A1c 9.6) recently hospitalized for community-acquired pneumonia.  He had presented to [facility] ED with chest pain, was found to be tachycardic and tachypneic on presentation. troponin was mildly elevated at 0.22, chest x-ray revealed multifocal patchy opacities suggestive of pneumonia. He was placed on BiPAP due to worsening dyspnea and transferred to the ICU where he received IV Rocephin and doxycycline which was switched to Zosyn and vancomycin and subsequently to Rocephin and azithromycin. Blood cultures were negative, influenza was negative and so was his urine strep antigen and urine Legionella. He was seen by cardiology and elevated troponin thought to be secondary to demand ischemia given he had a cardiac cath in 02/2015. He received aggressive diuresis; went into A. fib with RVR and so was placed on Cardizem drip which was subsequently transitioned to oral Cardizem (beta blocker was discontinued due to his cocaine use) His condition improved and he was subsequently discharged   She reports doing well today and has no complaints at this time. He has completed his course of antibiotics and denies shortness of breath or chest pains. Continues to smoke 3 cigarettes a day and states he has cut back from a pack a day. He has no upcoming appointment with cardiology as he states the one he had was rescheduled. Last used cocaine 2 weeks ago  Outpatient Prescriptions Prior to Visit  Medication Sig Dispense Refill  . albuterol  (PROVENTIL HFA;VENTOLIN HFA) 108 (90 BASE) MCG/ACT inhaler Inhale 2 puffs into the lungs every 6 (six) hours as needed for wheezing or shortness of breath. 1 Inhaler 2  . atorvastatin (LIPITOR) 80 MG tablet Take 1 tablet (80 mg total) by mouth every evening. 30 tablet 3  . Blood Glucose Monitoring Suppl (TRUE METRIX METER) DEVI 1 Device by Does not apply route 3 (three) times daily. 1 Device 0  . Dexlansoprazole (DEXILANT) 30 MG capsule Take 1 capsule (30 mg total) by mouth daily. 90 capsule 3  . digoxin (LANOXIN) 0.125 MG tablet Take 1 tablet (0.125 mg total) by mouth daily. 30 tablet 3  . diltiazem (CARDIZEM CD) 240 MG 24 hr capsule Take 1 capsule (240 mg total) by mouth daily. 30 capsule 3  . furosemide (LASIX) 40 MG tablet Take 1 tablet (40 mg total) by mouth daily. 30 tablet 3  . glipiZIDE (GLUCOTROL) 5 MG tablet Take 1 tablet (5 mg total) by mouth 2 (two) times daily before a meal. 60 tablet 3  . glucose blood (TRUE METRIX BLOOD GLUCOSE TEST) test strip Use as instructed 100 each 12  . hydrALAZINE (APRESOLINE) 50 MG tablet Take 1.5 tablets (75 mg total) by mouth 3 (three) times daily. 135 tablet 3  . ipratropium-albuterol (DUONEB) 0.5-2.5 (3) MG/3ML SOLN Take 3 mLs by nebulization 2 (two) times daily. 360 mL 10  . isosorbide mononitrate (IMDUR) 30 MG 24 hr tablet Take 1 tablet (30 mg total) by mouth daily. 30 tablet 3  . nitroGLYCERIN (NITROSTAT) 0.4 MG SL tablet Place 1 tablet (0.4 mg total) under the tongue every 5 (five) minutes as needed for chest pain (up to  3 doses). 25 tablet 3  . potassium chloride SA (K-DUR,KLOR-CON) 20 MEQ tablet Take 1 tablet (20 mEq total) by mouth daily. 30 tablet 3  . rivaroxaban (XARELTO) 20 MG TABS tablet Take 1 tablet (20 mg total) by mouth daily with supper. 30 tablet 3  . spironolactone (ALDACTONE) 25 MG tablet Take 1 tablet (25 mg total) by mouth daily. 30 tablet 3  . TRUEPLUS LANCETS 28G MISC 1 each by Does not apply route 3 (three) times daily. 100 each 12   . azithromycin (ZITHROMAX) 500 MG tablet Take 1 tablet (500 mg total) by mouth daily. X 7 days 7 tablet 0  . cefUROXime (CEFTIN) 500 MG tablet Take 1 tablet (500 mg total) by mouth 2 (two) times daily with a meal. X 7days 14 tablet 0   No facility-administered medications prior to visit.    ROS Review of Systems  Constitutional: Negative for activity change and appetite change.  HENT: Negative for sinus pressure and sore throat.   Eyes: Negative for visual disturbance.  Respiratory: Negative for cough, chest tightness and shortness of breath.   Cardiovascular: Negative for chest pain and leg swelling.  Gastrointestinal: Negative for abdominal pain, diarrhea, constipation and abdominal distention.  Endocrine: Negative.   Genitourinary: Negative for dysuria.  Musculoskeletal: Negative for myalgias and joint swelling.  Skin: Negative for rash.  Allergic/Immunologic: Negative.   Neurological: Negative for weakness, light-headedness and numbness.  Psychiatric/Behavioral: Negative for suicidal ideas and dysphoric mood.    Objective:  BP 151/106 mmHg  Pulse 105  Temp(Src) 98.9 F (37.2 C)  Resp 13  Ht 5\' 10"  (1.778 m)  Wt 169 lb 3.2 oz (76.749 kg)  BMI 24.28 kg/m2  SpO2 97%  BP/Weight 10/28/2015 10/17/2015 04/16/2015  Systolic BP 151 138 126  Diastolic BP 106 86 86  Wt. (Lbs) 169.2 154.6 190  BMI 24.28 22.18 27.26    Lab Results  Component Value Date   WBC 8.5 10/17/2015   HGB 13.9 10/17/2015   HCT 42.1 10/17/2015   PLT 266 10/17/2015   GLUCOSE 228* 10/17/2015   CHOL 128 10/13/2015   TRIG 67 10/13/2015   HDL 35* 10/13/2015   LDLCALC 80 10/13/2015   Hinton 15 12/19/2014   AST 20 12/19/2014   NA 141 10/17/2015   K 3.7 10/17/2015   CL 104 10/17/2015   CREATININE 1.40* 10/17/2015   BUN 25* 10/17/2015   CO2 29 10/17/2015   TSH 1.538 02/20/2015   INR 1.31 10/13/2015   HGBA1C 9.6* 10/13/2015    Physical Exam  Constitutional: He is oriented to person, place, and time.  He appears well-developed and well-nourished.  HENT:  Head: Normocephalic and atraumatic.  Right Ear: External ear normal.  Left Ear: External ear normal.  Eyes: Conjunctivae and EOM are normal. Pupils are equal, round, and reactive to light.  Neck: Normal range of motion. Neck supple. No tracheal deviation present.  Cardiovascular: Normal heart sounds.  An irregularly irregular rhythm present. Tachycardia present.   No murmur heard. Pulmonary/Chest: Effort normal and breath sounds normal. No respiratory distress. He has no wheezes. He exhibits no tenderness.  Abdominal: Soft. Bowel sounds are normal. He exhibits no mass. There is no tenderness.  Musculoskeletal: Normal range of motion. He exhibits no edema or tenderness.  Neurological: He is alert and oriented to person, place, and time.  Skin: Skin is warm and dry.  Psychiatric: He has a normal mood and affect.    *[place]*         *  [facility]*            [address]            [place], [place] [address]              [phone]  ------------------------------------------------------------------- Transthoracic Echocardiography  Patient:  Sean Hinton, Sean Hinton MR #:    [id number] Study Date: 10/14/2015 Gender:   M Age:    54 Height:   177.8 cm Weight:   73.5 kg BSA:    1.91 m^2 Pt. Status: Room:    [facility]  Shanda Howells [address] [place] [address] ATTENDING  Derwood Kaplan [id number] SONOGRAPHER Arvil Chaco PERFORMING  [facility], Inpatient ADMITTING  Madelyn Flavors A  cc:  ------------------------------------------------------------------- LV EF: 35% -  40%  ------------------------------------------------------------------- Indications:   Acute respiratory distress 518.82.  ------------------------------------------------------------------- History:  PMH: Essential hypertension.  Chest pain. Atrial flutter. Atrial fibrillation. Coronary artery disease. Risk factors: Current tobacco use. Diabetes mellitus. Dyslipidemia.  ------------------------------------------------------------------- Study Conclusions  - Left ventricle: The cavity size was normal. Wall thickness was increased in a pattern of mild LVH. Systolic function was moderately reduced. The estimated ejection fraction was in the range of 35% to 40%. Severe hypokinesis of the apicalanteroseptal and apical myocardium. - Mitral valve: There was mild regurgitation. - Left atrium: The atrium was mildly dilated. - Right ventricle: The cavity size was normal. Wall thickness was mildly increased.  Transthoracic echocardiography. M-mode, complete 2D, spectral Doppler, and color Doppler. Birthdate: Patient birthdate: August 22, 1961. Age: Patient is 55 yr old. Sex: Gender: male. BMI: 23.2 kg/m^2. Blood pressure:   154/89 Patient status: Inpatient. Study date: Study date: 10/14/2015. Study time: 10:28 AM. Location: ICU/CCU  -------------------------------------------------------------------  ------------------------------------------------------------------- Left ventricle: The cavity size was normal. Wall thickness was increased in a pattern of mild LVH. Systolic function was moderately reduced. The estimated ejection fraction was in the range of 35% to 40%. Regional wall motion abnormalities:  Severe hypokinesis of the apicalanteroseptal and apical myocardium.  ------------------------------------------------------------------- Aortic valve:  Structurally normal valve.  Cusp separation was normal. Doppler: Transvalvular velocity was within the normal range. There was no stenosis. There was no regurgitation.  ------------------------------------------------------------------- Aorta: Aortic root: The aortic root was normal in size. Ascending aorta: The ascending aorta  was normal in size.  ------------------------------------------------------------------- Mitral valve:  Structurally normal valve.  Leaflet separation was normal. Doppler: Transvalvular velocity was within the normal range. There was no evidence for stenosis. There was mild regurgitation.  ------------------------------------------------------------------- Left atrium: The atrium was mildly dilated.  ------------------------------------------------------------------- Atrial septum: There is left to right bowing of the atrial septum .  ------------------------------------------------------------------- Right ventricle: The cavity size was normal. Wall thickness was mildly increased. Systolic function was normal.  ------------------------------------------------------------------- Pulmonic valve:  Structurally normal valve.  Cusp separation was normal. Doppler: Transvalvular velocity was within the normal range. There was no regurgitation.  ------------------------------------------------------------------- Tricuspid valve:  Structurally normal valve.  Leaflet separation was normal. Doppler: Transvalvular velocity was within the normal range. There was trivial regurgitation.  ------------------------------------------------------------------- Right atrium: The atrium was normal in size.  ------------------------------------------------------------------- Pericardium: There was no pericardial effusion.  Assessment & Plan:   1. Type 2 diabetes mellitus without complication, without long-term current use of insulin (HCC) Uncontrolled with A1c of 9.6, CBG of 275 (patient had breakfast less than an hour ago) I will hold off on making changes to his regimen given his glipizide was increased from daily dosing to twice a day dosing at the time of discharge and  we expect some improvement in his A1c in the next 3 months. - Glucose (CBG) - Basic Metabolic Panel  2.  Essential hypertension Uncontrolled. Likely due to the fact that he took his antihypertensives less than an hour ago. We'll reassess blood pressure at his next visit  3. Atrial fibrillation and flutter (HCC) Currently in atrial fibrillation at this time. Advised to continue anticoagulation Xarelto, rate control with Cardizem. He has been advised to schedule an appointment with his cardiologist  ASAP  4. Coronary artery disease involving native coronary artery of native heart with other form of angina pectoris (HCC) Stable  5. Tobacco use Spent 3 minutes discussing cessation. He has cut back from 1 pack per day to 3 sticks per day and is working on quitting  6. Acute on chronic systolic CHF (congestive heart failure) (HCC) EF of 35-40% from 2-D echo of 09/2015. Euvolemic at this time. Advised on daily weight checks, low-sodium diet.  7. Community-acquired pneumonia Completed course of antibiotics. Asymptomatic. Chest x-ray at next visit to evaluate for complete resolution   No orders of the defined types were placed in this encounter.    Follow-up: Return in about 2 weeks (around 11/11/2015) for Transitional care clinic-follow-up for pneumonia.   Jaclyn Shaggy MD

## 2015-10-29 ENCOUNTER — Telehealth: Payer: Self-pay | Admitting: *Deleted

## 2015-10-29 NOTE — Telephone Encounter (Signed)
Left HIPAA compliant message for patient to return RN call at [phone] 

## 2015-10-29 NOTE — Telephone Encounter (Signed)
-----   Message from Jaclyn Shaggy, MD sent at 10/29/2015 10:35 AM EST ----- Labs are stable

## 2015-10-29 NOTE — Telephone Encounter (Signed)
Pt. Returned call. Please f/u with pt. °

## 2015-10-29 NOTE — Telephone Encounter (Signed)
Patient called and results given to patient.

## 2015-11-02 ENCOUNTER — Encounter (HOSPITAL_COMMUNITY): Payer: Self-pay | Admitting: Emergency Medicine

## 2015-11-02 ENCOUNTER — Inpatient Hospital Stay (HOSPITAL_COMMUNITY)
Admission: EM | Admit: 2015-11-02 | Discharge: 2015-11-06 | DRG: 246 | Disposition: A | Discharge status: Home / Self Care | Payer: Self-pay | Attending: Internal Medicine | Admitting: Internal Medicine

## 2015-11-02 DIAGNOSIS — E785 Hyperlipidemia, unspecified: Secondary | ICD-10-CM

## 2015-11-02 DIAGNOSIS — I214 Non-ST elevation (NSTEMI) myocardial infarction: Principal | ICD-10-CM

## 2015-11-02 DIAGNOSIS — Z720 Tobacco use: Secondary | ICD-10-CM

## 2015-11-02 DIAGNOSIS — I5189 Other ill-defined heart diseases: Secondary | ICD-10-CM

## 2015-11-02 DIAGNOSIS — I519 Heart disease, unspecified: Secondary | ICD-10-CM

## 2015-11-02 DIAGNOSIS — Z7901 Long term (current) use of anticoagulants: Secondary | ICD-10-CM

## 2015-11-02 DIAGNOSIS — I5023 Acute on chronic systolic (congestive) heart failure: Secondary | ICD-10-CM

## 2015-11-02 DIAGNOSIS — E1159 Type 2 diabetes mellitus with other circulatory complications: Secondary | ICD-10-CM

## 2015-11-02 DIAGNOSIS — I25118 Atherosclerotic heart disease of native coronary artery with other forms of angina pectoris: Secondary | ICD-10-CM

## 2015-11-02 DIAGNOSIS — F17210 Nicotine dependence, cigarettes, uncomplicated: Secondary | ICD-10-CM | POA: Diagnosis present

## 2015-11-02 DIAGNOSIS — K219 Gastro-esophageal reflux disease without esophagitis: Secondary | ICD-10-CM

## 2015-11-02 DIAGNOSIS — Z955 Presence of coronary angioplasty implant and graft: Secondary | ICD-10-CM

## 2015-11-02 DIAGNOSIS — I480 Paroxysmal atrial fibrillation: Secondary | ICD-10-CM

## 2015-11-02 DIAGNOSIS — I4892 Unspecified atrial flutter: Secondary | ICD-10-CM

## 2015-11-02 DIAGNOSIS — I2489 Other forms of acute ischemic heart disease: Secondary | ICD-10-CM

## 2015-11-02 DIAGNOSIS — Z7984 Long term (current) use of oral hypoglycemic drugs: Secondary | ICD-10-CM

## 2015-11-02 DIAGNOSIS — R059 Cough, unspecified: Secondary | ICD-10-CM

## 2015-11-02 DIAGNOSIS — IMO0001 Reserved for inherently not codable concepts without codable children: Secondary | ICD-10-CM

## 2015-11-02 DIAGNOSIS — I340 Nonrheumatic mitral (valve) insufficiency: Secondary | ICD-10-CM

## 2015-11-02 DIAGNOSIS — E119 Type 2 diabetes mellitus without complications: Secondary | ICD-10-CM

## 2015-11-02 DIAGNOSIS — E1169 Type 2 diabetes mellitus with other specified complication: Secondary | ICD-10-CM | POA: Diagnosis present

## 2015-11-02 DIAGNOSIS — F419 Anxiety disorder, unspecified: Secondary | ICD-10-CM

## 2015-11-02 DIAGNOSIS — I2582 Chronic total occlusion of coronary artery: Secondary | ICD-10-CM | POA: Diagnosis present

## 2015-11-02 DIAGNOSIS — I10 Essential (primary) hypertension: Secondary | ICD-10-CM

## 2015-11-02 DIAGNOSIS — I252 Old myocardial infarction: Secondary | ICD-10-CM | POA: Diagnosis present

## 2015-11-02 DIAGNOSIS — I248 Other forms of acute ischemic heart disease: Secondary | ICD-10-CM

## 2015-11-02 DIAGNOSIS — J9601 Acute respiratory failure with hypoxia: Secondary | ICD-10-CM

## 2015-11-02 DIAGNOSIS — R0603 Acute respiratory distress: Secondary | ICD-10-CM

## 2015-11-02 DIAGNOSIS — J189 Pneumonia, unspecified organism: Secondary | ICD-10-CM

## 2015-11-02 DIAGNOSIS — E1165 Type 2 diabetes mellitus with hyperglycemia: Secondary | ICD-10-CM | POA: Diagnosis present

## 2015-11-02 DIAGNOSIS — I4891 Unspecified atrial fibrillation: Secondary | ICD-10-CM

## 2015-11-02 DIAGNOSIS — I5043 Acute on chronic combined systolic (congestive) and diastolic (congestive) heart failure: Secondary | ICD-10-CM

## 2015-11-02 DIAGNOSIS — R079 Chest pain, unspecified: Secondary | ICD-10-CM

## 2015-11-02 DIAGNOSIS — F17200 Nicotine dependence, unspecified, uncomplicated: Secondary | ICD-10-CM

## 2015-11-02 DIAGNOSIS — R778 Other specified abnormalities of plasma proteins: Secondary | ICD-10-CM

## 2015-11-02 DIAGNOSIS — I2510 Atherosclerotic heart disease of native coronary artery without angina pectoris: Secondary | ICD-10-CM

## 2015-11-02 DIAGNOSIS — R05 Cough: Secondary | ICD-10-CM

## 2015-11-02 DIAGNOSIS — E139 Other specified diabetes mellitus without complications: Secondary | ICD-10-CM

## 2015-11-02 DIAGNOSIS — I110 Hypertensive heart disease with heart failure: Secondary | ICD-10-CM | POA: Diagnosis present

## 2015-11-02 DIAGNOSIS — I255 Ischemic cardiomyopathy: Secondary | ICD-10-CM

## 2015-11-02 DIAGNOSIS — I25119 Atherosclerotic heart disease of native coronary artery with unspecified angina pectoris: Secondary | ICD-10-CM | POA: Diagnosis present

## 2015-11-02 DIAGNOSIS — R7989 Other specified abnormal findings of blood chemistry: Secondary | ICD-10-CM

## 2015-11-02 DIAGNOSIS — R55 Syncope and collapse: Secondary | ICD-10-CM

## 2015-11-02 DIAGNOSIS — F1410 Cocaine abuse, uncomplicated: Secondary | ICD-10-CM | POA: Insufficient documentation

## 2015-11-02 HISTORY — DX: Non-ST elevation (NSTEMI) myocardial infarction: I21.4

## 2015-11-02 NOTE — ED Notes (Signed)
Patient here with complaint of central/right/left chest pain which began today after eating some spicy food. States history of multiple cardia problems, gastric ulcers, GERD. Denies other symptoms. Reports he drank some sprite in attempt to reduce pain, but it exacerbated his pain.

## 2015-11-02 NOTE — ED Provider Notes (Signed)
CSN: [id number]     Arrival date & time 11/02/15  2327 History  By signing my name below, I, Doreatha Martin, attest that this documentation has been prepared under the direction and in the presence of Derwood Kaplan, MD. Electronically Signed: Doreatha Martin, ED Scribe. 11/03/2015. 1:10 AM.    Chief Complaint  Patient presents with  . Chest Pain   The history is provided by the patient. No language interpreter was used.    HPI Comments: Sean Hinton is a 55 y.o. male with h/o DM, HTN, Afib and flutter, ICM, CAD, HLD, chronic systolic CHF, ARF, GERD who presents to the Emergency Department complaining of moderate, constant, non-radiating, worsening epigastric and bilateral CP onset yesterday at 3PM while eating. He states associated SOB, nausea, emesis, generalized abdominal pain, productive cough with clear phlegm. Pt describes his pain as burning and heavy. He reports that he was shoveling snow this morning and then ate smoked sausage before his pain began. He notes h/o similar CP pain, but not as severe and related to cardiac issues. Pt attributes his abdominal pain to the food he ate. He notes that he took Tylenol and a breathing treatment with no relief of pain, but reports mild to moderate relief of symptoms with 2 NTG taken PTA. Pt was seen in the ED on 10/13/15 for CP, was dx with CAP and admitted to the hospital for further treatment. He was discharged on 10/17/15 with an albuterol inhaler, duoneb treatments and azithromycin. Pt states he finished his course of antibiotics with no relief. He has not been seen by his cardiologist after his hospitalization due to the provider rescheduling. Pt was seen by his PCP on 10/28/15 with findings that his A1C was elevated and has CXR scheduled for 11/11/15. Last cocaine use was before his last hospital visit in December. Pt is a current smoker, 3 cigarettes/day. Pt is on daily Xarelto and Digoxin. No h/o COPD, asthma. He denies visual disturbance, numbness, focal  weakness, paresthesia, fever.   Past Medical History  Diagnosis Date  . Diabetes mellitus with complication (HCC)   . Hypertension   . Syncope     a. 02/2015 - felt 2/2 rapid AF/AFL.  Marland Kitchen Atrial fibrillation and flutter (HCC)     a. Dx 02/2015 - placed on Xarelto; b. 09/2015 recurrence in setting of PNA.  . Ischemic cardiomyopathy     a. 09/2015 Echo: EF 35-40%.  Marland Kitchen CAD (coronary artery disease)     a. Dx 02/2015 - 2V CAD with CTO LAD, diffuse dz in PDA, OM - med rx.  . Hypertensive heart disease   . Hyperlipidemia   . Mitral regurgitation     a. Mod by echo 02/2015,  . Tobacco abuse   . Chronic systolic CHF (congestive heart failure) (HCC)     a. 09/2015 Echo: EF 35-40%, sev apical/antlat, apical HK, mild MR, mildly dil LA.  Marland Kitchen ARF (acute respiratory failure) (HCC)   . Pneumonia 09/2015  . GERD (gastroesophageal reflux disease)   . Headache   . Arthritis     ra   Past Surgical History  Procedure Laterality Date  . Right thumb surgery     . Cardiac catheterization N/A 02/24/2015    Procedure: Left Heart Cath and Coronary Angiography;  Surgeon: Marykay Lex, MD;  Location: [facility] INVASIVE CV LAB CUPID;  Service: Cardiovascular;  Laterality: N/A;  . Cardiac catheterization  02/24/2015    Procedure: Coronary/Bypass Graft CTO Intervention;  Surgeon: Marykay Lex, MD;  Location:  [facility] INVASIVE CV LAB CUPID;  Service: Cardiovascular;;   Family History  Problem Relation Age of Onset  . Diabetes Mother   . Hypertension Mother   . Heart disease Mother   . Cancer Mother     stomach cancer   . Diabetes Father   . Hypertension Father   . Heart disease Father   . Diabetes Sister   . Hypertension Sister   . Cancer Sister     breast cancer   . Diabetes Brother   . Hypertension Brother   . Heart disease Brother   . Stroke Brother   . Heart attack Mother   . Heart attack Father   . Heart attack Brother    Social History  Substance Use Topics  . Smoking status: Current Every Day Smoker  -- 0.25 packs/day for 30 years    Types: Cigarettes  . Smokeless tobacco: Never Used  . Alcohol Use: No    Review of Systems A complete 10 system review of systems was obtained and all systems are negative except as noted in the HPI and PMH.    Allergies  Review of patient's allergies indicates no known allergies.  Home Medications   Prior to Admission medications   Medication Sig Start Date End Date Taking? Authorizing Provider  albuterol (PROVENTIL HFA;VENTOLIN HFA) 108 (90 BASE) MCG/ACT inhaler Inhale 2 puffs into the lungs every 6 (six) hours as needed for wheezing or shortness of breath. 10/17/15  Yes Ripudeep Jenna Luo, MD  atorvastatin (LIPITOR) 80 MG tablet Take 1 tablet (80 mg total) by mouth every evening. 10/17/15  Yes Ripudeep Jenna Luo, MD  Blood Glucose Monitoring Suppl (TRUE METRIX METER) DEVI 1 Device by Does not apply route 3 (three) times daily. 10/24/15  Yes Jaclyn Shaggy, MD  Dexlansoprazole (DEXILANT) 30 MG capsule Take 1 capsule (30 mg total) by mouth daily. 12/30/14  Yes Josalyn Funches, MD  digoxin (LANOXIN) 0.125 MG tablet Take 1 tablet (0.125 mg total) by mouth daily. 10/17/15  Yes Ripudeep Jenna Luo, MD  diltiazem (CARDIZEM CD) 240 MG 24 hr capsule Take 1 capsule (240 mg total) by mouth daily. 10/17/15  Yes Ripudeep Jenna Luo, MD  furosemide (LASIX) 40 MG tablet Take 1 tablet (40 mg total) by mouth daily. 02/25/15  Yes Dayna N Dunn, PA-C  glipiZIDE (GLUCOTROL) 5 MG tablet Take 1 tablet (5 mg total) by mouth 2 (two) times daily before a meal. 10/17/15  Yes Ripudeep K Rai, MD  glucose blood (TRUE METRIX BLOOD GLUCOSE TEST) test strip Use as instructed 10/24/15  Yes Jaclyn Shaggy, MD  hydrALAZINE (APRESOLINE) 50 MG tablet Take 1.5 tablets (75 mg total) by mouth 3 (three) times daily. 10/17/15  Yes Ripudeep K Rai, MD  ipratropium-albuterol (DUONEB) 0.5-2.5 (3) MG/3ML SOLN Take 3 mLs by nebulization 2 (two) times daily. 10/17/15  Yes Ripudeep Jenna Luo, MD  isosorbide mononitrate (IMDUR) 30 MG  24 hr tablet Take 1 tablet (30 mg total) by mouth daily. 10/17/15  Yes Ripudeep Jenna Luo, MD  nitroGLYCERIN (NITROSTAT) 0.4 MG SL tablet Place 1 tablet (0.4 mg total) under the tongue every 5 (five) minutes as needed for chest pain (up to 3 doses). 02/25/15  Yes Dayna N Dunn, PA-C  potassium chloride SA (K-DUR,KLOR-CON) 20 MEQ tablet Take 1 tablet (20 mEq total) by mouth daily. 10/17/15  Yes Ripudeep Jenna Luo, MD  rivaroxaban (XARELTO) 20 MG TABS tablet Take 1 tablet (20 mg total) by mouth daily with supper. 02/25/15  Yes Laurann Montana,  PA-C  spironolactone (ALDACTONE) 25 MG tablet Take 1 tablet (25 mg total) by mouth daily. 02/25/15  Yes Dayna N Dunn, PA-C  TRUEPLUS LANCETS 28G MISC 1 each by Does not apply route 3 (three) times daily. 10/24/15  Yes Jaclyn Shaggy, MD   BP 146/107 mmHg  Pulse 77  Temp(Src) 98.1 F (36.7 C) (Oral)  Resp 21  Ht 5\' 10"  (1.778 m)  Wt 170 lb (77.111 kg)  BMI 24.39 kg/m2  SpO2 100% Physical Exam  Constitutional: He is oriented to person, place, and time. He appears well-developed and well-nourished.  HENT:  Head: Normocephalic and atraumatic.  Eyes: Conjunctivae and EOM are normal. Pupils are equal, round, and reactive to light.  Neck: Normal range of motion. Neck supple. No JVD present.  No JVD  Cardiovascular: Normal rate, regular rhythm and normal heart sounds.  Exam reveals no gallop and no friction rub.   No murmur heard. RRR. 2+ and equal radial pulses bilaterally.   Pulmonary/Chest: Effort normal and breath sounds normal. No respiratory distress. He has no wheezes. He has no rales.  Lungs CTA bilaterally.   Abdominal: Soft. He exhibits no distension. There is tenderness.  Abdomen is diffusely tender, worse in the LLQ. No peritoneal findings.   Musculoskeletal: Normal range of motion.  Neurological: He is alert and oriented to person, place, and time.  Skin: Skin is warm and dry.  Psychiatric: He has a normal mood and affect. His behavior is normal.  Nursing note  and vitals reviewed.  ED Course  Procedures (including critical care time) DIAGNOSTIC STUDIES: Oxygen Saturation is 93% on RA, adequate by my interpretation.    COORDINATION OF CARE: 12:55 AM Discussed treatment plan with pt at bedside which includes labs, CXR and EKG and pt agreed to plan. Discussed elevated Trop level and CXR results with pt.   2:33 AM Pt reevaluated, still having chest pain, also has dib. His records indicate that he needed to go on CPAP and upgraded to stepdown from the floor last time - they think it could have been infection or CHF. PT asking for CPAP, he is tripoding, RR is in the low 20s. No retractions. O2 sats are stable. Will start on cpap. Admit to Stepdown.   Labs Review Labs Reviewed  BASIC METABOLIC PANEL - Abnormal; Notable for the following:    Glucose, Bld 255 (*)    Creatinine, Ser 1.34 (*)    GFR calc non Af Amer 59 (*)    All other components within normal limits  TROPONIN I - Abnormal; Notable for the following:    Troponin I 0.23 (*)    All other components within normal limits  URINE RAPID DRUG SCREEN, HOSP PERFORMED - Abnormal; Notable for the following:    Opiates POSITIVE (*)    Cocaine POSITIVE (*)    All other components within normal limits  PROTIME-INR - Abnormal; Notable for the following:    Prothrombin Time 17.2 (*)    All other components within normal limits  DIGOXIN LEVEL - Abnormal; Notable for the following:    Digoxin Level <0.2 (*)    All other components within normal limits  I-STAT TROPOININ, ED - Abnormal; Notable for the following:    Troponin i, poc 0.30 (*)    All other components within normal limits  CULTURE, BLOOD (ROUTINE X 2)  CULTURE, BLOOD (ROUTINE X 2)  CBC WITH DIFFERENTIAL/PLATELET  APTT  BRAIN NATRIURETIC PEPTIDE    Imaging Review Dg Chest 2 View  11/03/2015  CLINICAL DATA:  Central and RIGHT-sided chest pain or shortness of breath tonight. History of hypertension, diabetes, atrial fibrillation,  pneumonia, renal failure, CHF. EXAM: CHEST  2 VIEW COMPARISON:  Chest radiograph October 15, 2015 FINDINGS: Cardiomediastinal silhouette is unremarkable. Diffuse interstitial prominence with patchy airspace opacities, predominant LEFT lung base. No pleural effusion. No pneumothorax. Soft tissue planes and included osseous structures are nonsuspicious. IMPRESSION: Interstitial prominence, with patchy airspace opacities concerning for bronchopneumonia. Followup PA and lateral chest X-ray is recommended in 3-4 weeks following trial of antibiotic therapy to ensure resolution and exclude underlying malignancy. Electronically Signed   By: Awilda Metro M.D.   On: 11/03/2015 00:35   I have personally reviewed and evaluated these images and lab results as part of my medical decision-making.   EKG Interpretation   Date/Time:  Sunday November 02 2015 23:32:45 EST Ventricular Rate:  82 PR Interval:  139 QRS Duration: 95 QT Interval:  400 QTC Calculation: 467 R Axis:   34 Text Interpretation:  Sinus rhythm Right atrial enlargement Anterior  infarct, old ST elevation in anterior lead a bit more pronounced No  significant change since last tracing Confirmed by Azharia Surratt, MD, Janey Genta  [phone]) on 11/02/2015 11:54:26 PM      MDM   Final diagnoses:  Acute respiratory distress (HCC)  Acute on chronic combined systolic and diastolic congestive heart failure (HCC)  Demand ischemia (HCC)  HCAP (healthcare-associated pneumonia)    I personally performed the services described in this documentation, which was scribed in my presence. The recorded information has been reviewed and is accurate.   PT comes in with cc of chest pain. Known CAD, being medically managed. Recent admission for CAP. He has chest pain that is typical of his cardiac dz. He also is an active cocaine user and a smoker. Lungs are clear, Xray shows infiltrates. It is possible that the bronchopneumonia from prior imaging hasnt resolved, but  with his cough, some pleuritic chest pain and resp distress - we will cover him with antibiotics. Besides RR - he has no other SIRs. We will not get severe sepsis workup or call code sepsis.  Dissection considered in the ddx - but pt has normal vascular exam, no new neuro complains or deficits, and the pain is not radiating to the back and it is similar to his previous chest pain - so less likely. He is on xarelto - PE unlikely.  Trop is elevated.  Will admit.  CRITICAL CARE Performed by: Derwood Kaplan   Total critical care time: 35 minutes  Critical care time was exclusive of separately billable procedures and treating other patients.  Critical care was necessary to treat or prevent imminent or life-threatening deterioration.  Critical care was time spent personally by me on the following activities: development of treatment plan with patient and/or surrogate as well as nursing, discussions with consultants, evaluation of patient's response to treatment, examination of patient, obtaining history from patient or surrogate, ordering and performing treatments and interventions, ordering and review of laboratory studies, ordering and review of radiographic studies, pulse oximetry and re-evaluation of patient's condition.    Derwood Kaplan, MD 11/03/15 [phone]

## 2015-11-03 ENCOUNTER — Emergency Department (HOSPITAL_COMMUNITY): Payer: Self-pay

## 2015-11-03 ENCOUNTER — Encounter (HOSPITAL_COMMUNITY): Payer: Self-pay | Admitting: Emergency Medicine

## 2015-11-03 ENCOUNTER — Encounter (HOSPITAL_COMMUNITY)
Admission: EM | Disposition: A | Discharge status: Home / Self Care | Payer: Self-pay | Source: Home / Self Care | Attending: Internal Medicine

## 2015-11-03 ENCOUNTER — Ambulatory Visit (HOSPITAL_COMMUNITY): Admit: 2015-11-03 | Payer: Self-pay | Admitting: Cardiovascular Disease

## 2015-11-03 ENCOUNTER — Inpatient Hospital Stay (HOSPITAL_COMMUNITY): Payer: Self-pay

## 2015-11-03 DIAGNOSIS — J9601 Acute respiratory failure with hypoxia: Secondary | ICD-10-CM

## 2015-11-03 DIAGNOSIS — I248 Other forms of acute ischemic heart disease: Secondary | ICD-10-CM

## 2015-11-03 DIAGNOSIS — I252 Old myocardial infarction: Secondary | ICD-10-CM | POA: Diagnosis present

## 2015-11-03 DIAGNOSIS — I2510 Atherosclerotic heart disease of native coronary artery without angina pectoris: Secondary | ICD-10-CM

## 2015-11-03 DIAGNOSIS — I480 Paroxysmal atrial fibrillation: Secondary | ICD-10-CM | POA: Diagnosis present

## 2015-11-03 DIAGNOSIS — R0603 Acute respiratory distress: Secondary | ICD-10-CM | POA: Insufficient documentation

## 2015-11-03 DIAGNOSIS — I5043 Acute on chronic combined systolic (congestive) and diastolic (congestive) heart failure: Secondary | ICD-10-CM

## 2015-11-03 DIAGNOSIS — I25110 Atherosclerotic heart disease of native coronary artery with unstable angina pectoris: Secondary | ICD-10-CM

## 2015-11-03 DIAGNOSIS — E1159 Type 2 diabetes mellitus with other circulatory complications: Secondary | ICD-10-CM | POA: Diagnosis present

## 2015-11-03 DIAGNOSIS — I255 Ischemic cardiomyopathy: Secondary | ICD-10-CM

## 2015-11-03 HISTORY — PX: CARDIAC CATHETERIZATION: SHX172

## 2015-11-03 LAB — RAPID URINE DRUG SCREEN, HOSP PERFORMED
Amphetamines: NOT DETECTED
BARBITURATES: NOT DETECTED
Benzodiazepines: NOT DETECTED
Cocaine: POSITIVE — AB
Opiates: POSITIVE — AB
TETRAHYDROCANNABINOL: NOT DETECTED

## 2015-11-03 LAB — COMPREHENSIVE METABOLIC PANEL
ALBUMIN: 3 g/dL — AB (ref 3.5–5.0)
ALT: 42 U/L (ref 17–63)
ANION GAP: 8 (ref 5–15)
AST: 101 U/L — ABNORMAL HIGH (ref 15–41)
Alkaline Phosphatase: 102 U/L (ref 38–126)
BUN: 16 mg/dL (ref 6–20)
CALCIUM: 9 mg/dL (ref 8.9–10.3)
CHLORIDE: 103 mmol/L (ref 101–111)
CO2: 25 mmol/L (ref 22–32)
Creatinine, Ser: 1.11 mg/dL (ref 0.61–1.24)
GFR calc non Af Amer: >60 mL/min (ref >60)
GLUCOSE: 261 mg/dL — AB (ref 65–99)
Potassium: 3.8 mmol/L (ref 3.5–5.1)
SODIUM: 136 mmol/L (ref 135–145)
Total Bilirubin: 0.9 mg/dL (ref 0.3–1.2)
Total Protein: 6.4 g/dL — ABNORMAL LOW (ref 6.5–8.1)

## 2015-11-03 LAB — I-STAT TROPONIN, ED: Troponin i, poc: 0.3 ng/mL (ref 0.00–0.08)

## 2015-11-03 LAB — CBC WITH DIFFERENTIAL/PLATELET
BASOS ABS: 0 10*3/uL (ref 0.0–0.1)
BASOS PCT: 0 %
Basophils Absolute: 0 10*3/uL (ref 0.0–0.1)
Basophils Relative: 0 %
EOS PCT: 0 %
Eosinophils Absolute: 0 10*3/uL (ref 0.0–0.7)
Eosinophils Absolute: 0 10*3/uL (ref 0.0–0.7)
Eosinophils Relative: 0 %
HEMATOCRIT: 39.9 % (ref 39.0–52.0)
HEMATOCRIT: 41.6 % (ref 39.0–52.0)
HEMOGLOBIN: 13.5 g/dL (ref 13.0–17.0)
Hemoglobin: 13.8 g/dL (ref 13.0–17.0)
LYMPHS ABS: 1.4 10*3/uL (ref 0.7–4.0)
Lymphocytes Relative: 14 %
Lymphocytes Relative: 19 %
Lymphs Abs: 1.7 10*3/uL (ref 0.7–4.0)
MCH: 28.3 pg (ref 26.0–34.0)
MCH: 28.7 pg (ref 26.0–34.0)
MCHC: 33.2 g/dL (ref 30.0–36.0)
MCHC: 33.8 g/dL (ref 30.0–36.0)
MCV: 84.9 fL (ref 78.0–100.0)
MCV: 85.2 fL (ref 78.0–100.0)
MONO ABS: 0.4 10*3/uL (ref 0.1–1.0)
MONO ABS: 0.9 10*3/uL (ref 0.1–1.0)
MONOS PCT: 5 %
MONOS PCT: 8 %
NEUTROS ABS: 6.8 10*3/uL (ref 1.7–7.7)
NEUTROS ABS: 8.3 10*3/uL — AB (ref 1.7–7.7)
NEUTROS PCT: 78 %
Neutrophils Relative %: 76 %
PLATELETS: 159 10*3/uL (ref 150–400)
Platelets: 208 10*3/uL (ref 150–400)
RBC: 4.7 MIL/uL (ref 4.22–5.81)
RBC: 4.88 MIL/uL (ref 4.22–5.81)
RDW: 14.5 % (ref 11.5–15.5)
RDW: 14.6 % (ref 11.5–15.5)
WBC: 10.5 10*3/uL (ref 4.0–10.5)
WBC: 8.9 10*3/uL (ref 4.0–10.5)

## 2015-11-03 LAB — APTT
APTT: 33 s (ref 24–37)
aPTT: 33 seconds (ref 24–37)

## 2015-11-03 LAB — BRAIN NATRIURETIC PEPTIDE: B NATRIURETIC PEPTIDE 5: 571.9 pg/mL — AB (ref 0.0–100.0)

## 2015-11-03 LAB — PROTIME-INR
INR: 1.36 (ref 0.00–1.49)
INR: 1.4 (ref 0.00–1.49)
PROTHROMBIN TIME: 16.9 s — AB (ref 11.6–15.2)
Prothrombin Time: 17.2 seconds — ABNORMAL HIGH (ref 11.6–15.2)

## 2015-11-03 LAB — BASIC METABOLIC PANEL
Anion gap: 10 (ref 5–15)
BUN: 19 mg/dL (ref 6–20)
CALCIUM: 8.9 mg/dL (ref 8.9–10.3)
CO2: 25 mmol/L (ref 22–32)
CREATININE: 1.34 mg/dL — AB (ref 0.61–1.24)
Chloride: 103 mmol/L (ref 101–111)
GFR calc Af Amer: >60 mL/min (ref >60)
GFR, EST NON AFRICAN AMERICAN: 59 mL/min — AB (ref >60)
GLUCOSE: 255 mg/dL — AB (ref 65–99)
Potassium: 4.4 mmol/L (ref 3.5–5.1)
Sodium: 138 mmol/L (ref 135–145)

## 2015-11-03 LAB — PROCALCITONIN: Procalcitonin: <0.1 ng/mL

## 2015-11-03 LAB — MRSA PCR SCREENING: MRSA BY PCR: NEGATIVE

## 2015-11-03 LAB — HEPARIN LEVEL (UNFRACTIONATED): Heparin Unfractionated: 0.19 IU/mL — ABNORMAL LOW (ref 0.30–0.70)

## 2015-11-03 LAB — TROPONIN I
Troponin I: 0.23 ng/mL — ABNORMAL HIGH (ref <0.031)
Troponin I: 15.62 ng/mL (ref <0.031)

## 2015-11-03 LAB — DIGOXIN LEVEL: Digoxin Level: <0.2 ng/mL — ABNORMAL LOW (ref 0.8–2.0)

## 2015-11-03 LAB — CBG MONITORING, ED: Glucose-Capillary: 199 mg/dL — ABNORMAL HIGH (ref 65–99)

## 2015-11-03 LAB — TSH: TSH: 0.447 u[IU]/mL (ref 0.350–4.500)

## 2015-11-03 LAB — POCT ACTIVATED CLOTTING TIME: ACTIVATED CLOTTING TIME: 379 s

## 2015-11-03 SURGERY — CORONARY STENT INTERVENTION

## 2015-11-03 MED ORDER — ACETAMINOPHEN 325 MG PO TABS
650.0000 mg | ORAL_TABLET | Freq: Four times a day (QID) | ORAL | Status: DC | PRN
  Administered 2015-11-05: 650 mg via ORAL
  Filled 2015-11-03: qty 2

## 2015-11-03 MED ORDER — FENTANYL CITRATE (PF) 100 MCG/2ML IJ SOLN
INTRAMUSCULAR | Status: AC
  Filled 2015-11-03: qty 2

## 2015-11-03 MED ORDER — VERAPAMIL HCL 2.5 MG/ML IV SOLN
INTRAVENOUS | Status: DC | PRN
  Administered 2015-11-03: 18:00:00 via INTRA_ARTERIAL

## 2015-11-03 MED ORDER — CLOPIDOGREL BISULFATE 75 MG PO TABS
75.0000 mg | ORAL_TABLET | Freq: Every day | ORAL | Status: DC
  Administered 2015-11-04 – 2015-11-06 (×3): 75 mg via ORAL
  Filled 2015-11-03 (×3): qty 1

## 2015-11-03 MED ORDER — ALBUTEROL SULFATE (2.5 MG/3ML) 0.083% IN NEBU: 2.5000 mg | INHALATION_SOLUTION | Freq: Four times a day (QID) | RESPIRATORY_TRACT | Status: DC | PRN

## 2015-11-03 MED ORDER — FUROSEMIDE 40 MG PO TABS
40.0000 mg | ORAL_TABLET | Freq: Every day | ORAL | Status: DC
  Administered 2015-11-03 – 2015-11-06 (×4): 40 mg via ORAL
  Filled 2015-11-03 (×2): qty 2
  Filled 2015-11-03 (×2): qty 1

## 2015-11-03 MED ORDER — HEPARIN SODIUM (PORCINE) 1000 UNIT/ML IJ SOLN
INTRAMUSCULAR | Status: AC
  Filled 2015-11-03: qty 1

## 2015-11-03 MED ORDER — DOXYCYCLINE HYCLATE 100 MG IV SOLR
100.0000 mg | Freq: Once | INTRAVENOUS | Status: AC
  Administered 2015-11-03: 100 mg via INTRAVENOUS
  Filled 2015-11-03: qty 100

## 2015-11-03 MED ORDER — HYDRALAZINE HCL 50 MG PO TABS
75.0000 mg | ORAL_TABLET | Freq: Three times a day (TID) | ORAL | Status: DC
  Administered 2015-11-03 (×2): 75 mg via ORAL
  Filled 2015-11-03: qty 3
  Filled 2015-11-03: qty 1

## 2015-11-03 MED ORDER — SPIRONOLACTONE 25 MG PO TABS
25.0000 mg | ORAL_TABLET | Freq: Every day | ORAL | Status: DC
  Administered 2015-11-03 – 2015-11-06 (×4): 25 mg via ORAL
  Filled 2015-11-03 (×4): qty 1

## 2015-11-03 MED ORDER — NITROGLYCERIN 0.4 MG SL SUBL: 0.4000 mg | SUBLINGUAL_TABLET | SUBLINGUAL | Status: DC | PRN

## 2015-11-03 MED ORDER — ONDANSETRON HCL 4 MG/2ML IJ SOLN
4.0000 mg | Freq: Four times a day (QID) | INTRAMUSCULAR | Status: DC | PRN
  Administered 2015-11-03: 4 mg via INTRAVENOUS
  Filled 2015-11-03: qty 2

## 2015-11-03 MED ORDER — ONDANSETRON HCL 4 MG PO TABS: 4.0000 mg | ORAL_TABLET | Freq: Four times a day (QID) | ORAL | Status: DC | PRN

## 2015-11-03 MED ORDER — ASPIRIN 81 MG PO CHEW
81.0000 mg | CHEWABLE_TABLET | Freq: Every day | ORAL | Status: DC
  Administered 2015-11-04 – 2015-11-06 (×3): 81 mg via ORAL
  Filled 2015-11-03 (×3): qty 1

## 2015-11-03 MED ORDER — DEXTROSE 5 % IV SOLN
2.0000 g | Freq: Two times a day (BID) | INTRAVENOUS | Status: DC
  Administered 2015-11-04: 2 g via INTRAVENOUS
  Filled 2015-11-03 (×4): qty 2

## 2015-11-03 MED ORDER — SODIUM CHLORIDE 0.9 % IV SOLN: 250.0000 mL | INTRAVENOUS | Status: DC | PRN

## 2015-11-03 MED ORDER — ACETAMINOPHEN 650 MG RE SUPP
650.0000 mg | Freq: Four times a day (QID) | RECTAL | Status: DC | PRN
Start: 2015-11-03 — End: 2015-11-06

## 2015-11-03 MED ORDER — POTASSIUM CHLORIDE CRYS ER 20 MEQ PO TBCR
20.0000 meq | EXTENDED_RELEASE_TABLET | Freq: Every day | ORAL | Status: DC
  Administered 2015-11-03 – 2015-11-06 (×4): 20 meq via ORAL
  Filled 2015-11-03 (×4): qty 1

## 2015-11-03 MED ORDER — IPRATROPIUM-ALBUTEROL 0.5-2.5 (3) MG/3ML IN SOLN
3.0000 mL | Freq: Once | RESPIRATORY_TRACT | Status: AC
  Administered 2015-11-03: 3 mL via RESPIRATORY_TRACT
  Filled 2015-11-03: qty 3

## 2015-11-03 MED ORDER — ATORVASTATIN CALCIUM 80 MG PO TABS
80.0000 mg | ORAL_TABLET | Freq: Every evening | ORAL | Status: DC
  Administered 2015-11-04 – 2015-11-05 (×2): 80 mg via ORAL
  Filled 2015-11-03 (×3): qty 1

## 2015-11-03 MED ORDER — BIVALIRUDIN BOLUS VIA INFUSION - CUPID
INTRAVENOUS | Status: DC | PRN
  Administered 2015-11-03: 57.825 mg via INTRAVENOUS

## 2015-11-03 MED ORDER — NITROGLYCERIN 2 % TD OINT
0.5000 [in_us] | TOPICAL_OINTMENT | Freq: Four times a day (QID) | TRANSDERMAL | Status: DC
  Administered 2015-11-03: 0.5 [in_us] via TOPICAL
  Filled 2015-11-03: qty 1

## 2015-11-03 MED ORDER — ALUM & MAG HYDROXIDE-SIMETH 200-200-20 MG/5ML PO SUSP
30.0000 mL | Freq: Four times a day (QID) | ORAL | Status: DC | PRN
  Administered 2015-11-04 – 2015-11-05 (×3): 30 mL via ORAL
  Filled 2015-11-03 (×3): qty 30

## 2015-11-03 MED ORDER — CEFEPIME HCL 2 G IJ SOLR
2.0000 g | Freq: Two times a day (BID) | INTRAMUSCULAR | Status: DC
  Administered 2015-11-03: 2 g via INTRAVENOUS
  Filled 2015-11-03: qty 2

## 2015-11-03 MED ORDER — PANTOPRAZOLE SODIUM 40 MG PO TBEC
40.0000 mg | DELAYED_RELEASE_TABLET | Freq: Every day | ORAL | Status: DC
  Administered 2015-11-03 – 2015-11-06 (×4): 40 mg via ORAL
  Filled 2015-11-03 (×4): qty 1

## 2015-11-03 MED ORDER — LABETALOL HCL 100 MG PO TABS
100.0000 mg | ORAL_TABLET | Freq: Three times a day (TID) | ORAL | Status: DC
  Administered 2015-11-03 (×2): 100 mg via ORAL
  Filled 2015-11-03 (×3): qty 1

## 2015-11-03 MED ORDER — NITROGLYCERIN 1 MG/10 ML FOR IR/CATH LAB
INTRA_ARTERIAL | Status: DC | PRN
  Administered 2015-11-03: 200 ug via INTRACORONARY

## 2015-11-03 MED ORDER — MORPHINE SULFATE (PF) 4 MG/ML IV SOLN
4.0000 mg | Freq: Once | INTRAVENOUS | Status: AC
  Administered 2015-11-03: 4 mg via INTRAVENOUS
  Filled 2015-11-03: qty 1

## 2015-11-03 MED ORDER — VANCOMYCIN HCL IN DEXTROSE 1-5 GM/200ML-% IV SOLN
1000.0000 mg | Freq: Once | INTRAVENOUS | Status: AC
  Administered 2015-11-03: 1000 mg via INTRAVENOUS
  Filled 2015-11-03: qty 200

## 2015-11-03 MED ORDER — INSULIN ASPART 100 UNIT/ML ~~LOC~~ SOLN
0.0000 [IU] | Freq: Three times a day (TID) | SUBCUTANEOUS | Status: DC
  Administered 2015-11-03 – 2015-11-04 (×2): 2 [IU] via SUBCUTANEOUS
  Administered 2015-11-04: 3 [IU] via SUBCUTANEOUS
  Administered 2015-11-04: 5 [IU] via SUBCUTANEOUS
  Administered 2015-11-05: 3 [IU] via SUBCUTANEOUS
  Administered 2015-11-05: 1 [IU] via SUBCUTANEOUS
  Administered 2015-11-05: 7 [IU] via SUBCUTANEOUS
  Administered 2015-11-06: 1 [IU] via SUBCUTANEOUS
  Filled 2015-11-03: qty 1

## 2015-11-03 MED ORDER — HEPARIN SODIUM (PORCINE) 1000 UNIT/ML IJ SOLN
INTRAMUSCULAR | Status: DC | PRN
Start: 2015-11-03 — End: 2015-11-03
  Administered 2015-11-03: 2000 [IU] via INTRAVENOUS

## 2015-11-03 MED ORDER — HEPARIN (PORCINE) IN NACL 100-0.45 UNIT/ML-% IJ SOLN
1450.0000 [IU]/h | INTRAMUSCULAR | Status: DC
  Administered 2015-11-03: 1200 [IU]/h via INTRAVENOUS
  Filled 2015-11-03 (×2): qty 250

## 2015-11-03 MED ORDER — HEPARIN (PORCINE) IN NACL 2-0.9 UNIT/ML-% IJ SOLN
INTRAMUSCULAR | Status: DC | PRN
  Administered 2015-11-03: 18:00:00

## 2015-11-03 MED ORDER — SODIUM CHLORIDE 0.9 % IJ SOLN: 3.0000 mL | INTRAMUSCULAR | Status: DC | PRN

## 2015-11-03 MED ORDER — ASPIRIN EC 325 MG PO TBEC
325.0000 mg | DELAYED_RELEASE_TABLET | Freq: Every day | ORAL | Status: DC
  Administered 2015-11-03: 325 mg via ORAL
  Filled 2015-11-03 (×2): qty 1

## 2015-11-03 MED ORDER — SODIUM CHLORIDE 0.9 % IJ SOLN: 3.0000 mL | Freq: Two times a day (BID) | INTRAMUSCULAR | Status: DC

## 2015-11-03 MED ORDER — GLIPIZIDE 5 MG PO TABS
5.0000 mg | ORAL_TABLET | Freq: Two times a day (BID) | ORAL | Status: DC
  Administered 2015-11-04 – 2015-11-05 (×4): 5 mg via ORAL
  Filled 2015-11-03 (×6): qty 1

## 2015-11-03 MED ORDER — IOHEXOL 350 MG/ML SOLN
INTRAVENOUS | Status: DC | PRN
  Administered 2015-11-03: 135 mL via INTRAVENOUS

## 2015-11-03 MED ORDER — SODIUM CHLORIDE 0.9 % IV SOLN
INTRAVENOUS | Status: DC | PRN
  Administered 2015-11-03: 10 mL/h via INTRAVENOUS

## 2015-11-03 MED ORDER — MIDAZOLAM HCL 2 MG/2ML IJ SOLN
INTRAMUSCULAR | Status: DC | PRN
  Administered 2015-11-03: 1 mg via INTRAVENOUS

## 2015-11-03 MED ORDER — CLOPIDOGREL BISULFATE 300 MG PO TABS
ORAL_TABLET | ORAL | Status: DC | PRN
  Administered 2015-11-03: 600 mg via ORAL

## 2015-11-03 MED ORDER — VERAPAMIL HCL 2.5 MG/ML IV SOLN
INTRAVENOUS | Status: AC
  Filled 2015-11-03: qty 2

## 2015-11-03 MED ORDER — HEPARIN (PORCINE) IN NACL 100-0.45 UNIT/ML-% IJ SOLN: 1200.0000 [IU]/h | INTRAMUSCULAR | Status: DC

## 2015-11-03 MED ORDER — LIDOCAINE HCL (PF) 1 % IJ SOLN
INTRAMUSCULAR | Status: DC | PRN
  Administered 2015-11-03: 3 mL

## 2015-11-03 MED ORDER — FUROSEMIDE 10 MG/ML IJ SOLN
INTRAMUSCULAR | Status: AC
  Filled 2015-11-03: qty 8

## 2015-11-03 MED ORDER — MORPHINE SULFATE (PF) 2 MG/ML IV SOLN
1.0000 mg | INTRAVENOUS | Status: DC | PRN
  Administered 2015-11-03 – 2015-11-05 (×4): 1 mg via INTRAVENOUS
  Filled 2015-11-03 (×5): qty 1

## 2015-11-03 MED ORDER — SODIUM CHLORIDE 0.9 % IJ SOLN
3.0000 mL | Freq: Two times a day (BID) | INTRAMUSCULAR | Status: DC
  Administered 2015-11-04 – 2015-11-05 (×3): 3 mL via INTRAVENOUS

## 2015-11-03 MED ORDER — NITROGLYCERIN IN D5W 200-5 MCG/ML-% IV SOLN
0.0000 ug/min | INTRAVENOUS | Status: DC
  Administered 2015-11-03: 5 ug/min via INTRAVENOUS
  Administered 2015-11-03: 20 ug/min via INTRAVENOUS
  Administered 2015-11-04: 40 ug/min via INTRAVENOUS
  Filled 2015-11-03 (×2): qty 250

## 2015-11-03 MED ORDER — HEPARIN (PORCINE) IN NACL 2-0.9 UNIT/ML-% IJ SOLN
INTRAMUSCULAR | Status: AC
  Filled 2015-11-03: qty 500

## 2015-11-03 MED ORDER — IOHEXOL 350 MG/ML SOLN
80.0000 mL | Freq: Once | INTRAVENOUS | Status: AC | PRN
  Administered 2015-11-03: 100 mL via INTRAVENOUS

## 2015-11-03 MED ORDER — LIDOCAINE HCL (PF) 1 % IJ SOLN
INTRAMUSCULAR | Status: AC
  Filled 2015-11-03: qty 30

## 2015-11-03 MED ORDER — DILTIAZEM HCL ER COATED BEADS 240 MG PO CP24
240.0000 mg | ORAL_CAPSULE | Freq: Every day | ORAL | Status: DC
  Administered 2015-11-03: 240 mg via ORAL
  Filled 2015-11-03: qty 1

## 2015-11-03 MED ORDER — CLOPIDOGREL BISULFATE 300 MG PO TABS
ORAL_TABLET | ORAL | Status: AC
  Filled 2015-11-03: qty 2

## 2015-11-03 MED ORDER — FUROSEMIDE 10 MG/ML IJ SOLN
INTRAMUSCULAR | Status: DC | PRN
  Administered 2015-11-03: 80 mg via INTRAVENOUS

## 2015-11-03 MED ORDER — ALBUTEROL SULFATE HFA 108 (90 BASE) MCG/ACT IN AERS: 2.0000 | INHALATION_SPRAY | Freq: Four times a day (QID) | RESPIRATORY_TRACT | Status: DC | PRN

## 2015-11-03 MED ORDER — BIVALIRUDIN 250 MG IV SOLR
250.0000 mg | INTRAVENOUS | Status: DC | PRN
  Administered 2015-11-03: 1.75 mg/kg/h via INTRAVENOUS

## 2015-11-03 MED ORDER — IPRATROPIUM-ALBUTEROL 0.5-2.5 (3) MG/3ML IN SOLN
3.0000 mL | Freq: Two times a day (BID) | RESPIRATORY_TRACT | Status: DC
  Administered 2015-11-03 – 2015-11-06 (×5): 3 mL via RESPIRATORY_TRACT
  Filled 2015-11-03 (×6): qty 3

## 2015-11-03 MED ORDER — HEPARIN BOLUS VIA INFUSION
4000.0000 [IU] | Freq: Once | INTRAVENOUS | Status: AC
  Administered 2015-11-03: 4000 [IU] via INTRAVENOUS
  Filled 2015-11-03: qty 4000

## 2015-11-03 MED ORDER — MIDAZOLAM HCL 2 MG/2ML IJ SOLN
INTRAMUSCULAR | Status: AC
  Filled 2015-11-03: qty 2

## 2015-11-03 MED ORDER — SODIUM CHLORIDE 0.9 % IJ SOLN
3.0000 mL | Freq: Two times a day (BID) | INTRAMUSCULAR | Status: DC
Start: 2015-11-03 — End: 2015-11-06
  Administered 2015-11-03 – 2015-11-04 (×2): 3 mL via INTRAVENOUS

## 2015-11-03 MED ORDER — LORAZEPAM 2 MG/ML IJ SOLN
2.0000 mg | Freq: Once | INTRAMUSCULAR | Status: AC
  Administered 2015-11-03: 2 mg via INTRAVENOUS
  Filled 2015-11-03: qty 1

## 2015-11-03 MED ORDER — DIGOXIN 125 MCG PO TABS
0.1250 mg | ORAL_TABLET | Freq: Every day | ORAL | Status: DC
  Administered 2015-11-03 – 2015-11-06 (×4): 0.125 mg via ORAL
  Filled 2015-11-03 (×4): qty 1

## 2015-11-03 MED ORDER — FENTANYL CITRATE (PF) 100 MCG/2ML IJ SOLN
INTRAMUSCULAR | Status: DC | PRN
  Administered 2015-11-03: 25 ug via INTRAVENOUS

## 2015-11-03 MED ORDER — IPRATROPIUM-ALBUTEROL 0.5-2.5 (3) MG/3ML IN SOLN: 3.0000 mL | Freq: Two times a day (BID) | RESPIRATORY_TRACT | Status: DC

## 2015-11-03 MED ORDER — NITROGLYCERIN 1 MG/10 ML FOR IR/CATH LAB
INTRA_ARTERIAL | Status: AC
  Filled 2015-11-03: qty 10

## 2015-11-03 MED ORDER — RIVAROXABAN 20 MG PO TABS
20.0000 mg | ORAL_TABLET | Freq: Every day | ORAL | Status: DC
  Administered 2015-11-04 – 2015-11-06 (×3): 20 mg via ORAL
  Filled 2015-11-03 (×3): qty 1

## 2015-11-03 MED ORDER — VANCOMYCIN HCL IN DEXTROSE 1-5 GM/200ML-% IV SOLN
1000.0000 mg | Freq: Two times a day (BID) | INTRAVENOUS | Status: DC
  Administered 2015-11-03 – 2015-11-04 (×2): 1000 mg via INTRAVENOUS
  Filled 2015-11-03 (×2): qty 200

## 2015-11-03 MED ORDER — BIVALIRUDIN 250 MG IV SOLR
INTRAVENOUS | Status: AC
  Filled 2015-11-03: qty 250

## 2015-11-03 MED ORDER — ISOSORBIDE MONONITRATE ER 30 MG PO TB24
30.0000 mg | ORAL_TABLET | Freq: Every day | ORAL | Status: DC
  Administered 2015-11-03: 30 mg via ORAL
  Filled 2015-11-03: qty 1

## 2015-11-03 SURGICAL SUPPLY — 17 items
BALLN EUPHORA RX 2.5X12 (BALLOONS) ×3
BALLN ~~LOC~~ EUPHORA RX 3.0X20 (BALLOONS) ×3
BALLOON EUPHORA RX 2.5X12 (BALLOONS) IMPLANT
BALLOON ~~LOC~~ EUPHORA RX 3.0X20 (BALLOONS) IMPLANT
CATH INFINITI 5FR MULTPACK ANG (CATHETERS) ×2 IMPLANT
CATH VISTA GUIDE 6FR XBLAD3.5 (CATHETERS) ×2 IMPLANT
DEVICE RAD COMP TR BAND LRG (VASCULAR PRODUCTS) ×3 IMPLANT
GLIDESHEATH SLEND SS 6F .021 (SHEATH) ×3 IMPLANT
KIT ENCORE 26 ADVANTAGE (KITS) ×2 IMPLANT
KIT HEART LEFT (KITS) ×3 IMPLANT
PACK CARDIAC CATHETERIZATION (CUSTOM PROCEDURE TRAY) ×3 IMPLANT
STENT SYNERGY DES 2.75X28 (Permanent Stent) ×2 IMPLANT
TRANSDUCER W/STOPCOCK (MISCELLANEOUS) ×3 IMPLANT
TUBING CIL FLEX 10 FLL-RA (TUBING) ×3 IMPLANT
VALVE GUARDIAN II ~~LOC~~ HEMO (MISCELLANEOUS) ×2 IMPLANT
WIRE COUGAR XT STRL 190CM (WIRE) ×2 IMPLANT
WIRE SAFE-T 1.5MM-J .035X260CM (WIRE) ×3 IMPLANT

## 2015-11-03 NOTE — Progress Notes (Signed)
Pt taken off BiPAP at this time.

## 2015-11-03 NOTE — Progress Notes (Signed)
ANTICOAGULATION CONSULT NOTE - Follow Up Consult  Pharmacy Consult for heparin Indication: chest pain/ACS  No Known Allergies  Patient Measurements: Height: 5\' 10"  (177.8 cm) Weight: 170 lb (77.111 kg) IBW/kg (Calculated) : 73  Vital Signs: BP: 164/104 mmHg (01/09 1345) Pulse Rate: 89 (01/09 1245)  Labs:  Recent Labs  11/03/15 0006 11/03/15 0105 11/03/15 0401 11/03/15 1109 11/03/15 1253  HGB 13.8  --   --  13.5  --   HCT 41.6  --   --  39.9  --   PLT 159  --   --  208  --   APTT 33  --  33  --   --   LABPROT 17.2*  --  16.9*  --   --   INR 1.40  --  1.36  --   --   HEPARINUNFRC  --   --  <0.10*  --  0.19*  CREATININE  --  1.34*  --  1.11  --   TROPONINI  --  0.23*  --  15.62*  --     Estimated Creatinine Clearance: 80.4 mL/min (by C-G formula based on Cr of 1.11).   Assessment: 55 y.o. male admitted with chest pain, h/o Afib and Xarelto on hold, for heparin Last dose of Xarelto 9 pm 1/8. Baseline HL was undetectable so will just monitor HL's from now on. First HL is low at 0.19. CBC stable, no s/s of bleed  Goal of Therapy:  Heparin level 0.3-0.7 units/ml Monitor platelets by anticoagulation protocol: Yes   Plan:  Increase heparin gtt to 1,450 unit/hr Check 6 hr HL Monitor daily HL, CBC, s/s of bleed  3/8, PharmD, [facility] Clinical Pharmacist Pager [phone] 11/03/2015 1:59 PM

## 2015-11-03 NOTE — Progress Notes (Signed)
[facility] PROGRESS NOTE  Lord Lancour VCB:[id number] DOB: [birth year] DOA: 11/02/2015 PCP: Lora Paula, MD  Assessment/Plan: 1. Non-ST segment elevation myocardial infarction -Mr.Junio having a history of coronary artery disease, having his last cardiac catheterization performed on 02/24/2015 that revealed severe multivessel disease; mid LAD 100% stenosed, first obtuse marginal branch 80% stenosed, right posterior descending artery-2 70% stenosed, right posterior descending artery-1 50% stenosed -He presents with chest pain having typical and atypical features. -Having significant troponin elevation from 0.23 to 15.62 -Cardiology consulted, he was started on IV heparin and IV nitroglycerin. He will likely be taken to the Cath lab this afternoon. Await further recs from cards.  -Aspirin 325 mg by mouth daily, Lipitor 80 mg by mouth daily, labetalol 100 mg by mouth 3 times a day  2.  Question pneumonia. -Patient was treated for community-acquired pneumonia during hospitalization for 10/13/2015 through 10/17/2015, discharged on Ceftin.  -Initial chest x-ray showing patchy airspace opacities that could possibly reflect bronchopneumonia. -Labs showing a white count of 10.5 and he is afebrile and nontoxic appearing. -It is unclear if shortness of breath and cough related to acute CHF precipitated by NSTEMI or HCAP.  -Will continue emperic antibiotic therapy for 24 more hours and repeat CXR in AM. If he remains afebrile and CXR is stable will monitor him off of antimicrobial therapy.  3.  Acute on chronic systolic congestive heart failure -Patient having history of ischemic cardiomyopathy, last transthoracic echocardiogram was performed with 10/14/2015 that showed EF of 35-40% with severe hypokinesis in the apical lateral septal and apical myocardium -Presented with complaints of shortness of breath. -I suspect NSTEMI precipitating acute CHF -He was given 40 mg of IV Lasix in the  emergency department.  4.  Acute hypoxemic respiratory failure -Evidence by patient presented in respiratory distress, having a respiratory rate of 40, placed on noninvasive positive pressure ventilation -Likely secondary to acute on chronic systolic congestive heart failure in setting of non-ST segment elevation myocardial infarction -Respiratory status improved with initiation of Lasix -Chest x-ray showed a possible pneumonia. For now we'll continue. IV antimicrobial therapy, repeat chest x-ray in a.m. If he remains afebrile with stable chest x-ray will consider stopping antimicrobials.  5.  History of polysubstance abuse -Urine drug screen was positive for cocaine. Looking back he has had previous positive urine drug screen for cocaine (on 10/14/2015) -Patient counseled.   Code Status: Full Code Family Communication: I spoke to his wife who was present at bedside. Disposition Plan: Probable Cath today   Consultants:  Cardiology   HPI/Subjective: Mr Zaremba is a 55 year old gentleman with a past medical history of ischemic cardiomyopathy, paroxysmal atrial fibrillation, chronically anticoagulate with Xarelto, coronary artery disease, presented to the emergency department on 11/01/2014 with complaints of chest pain that was associate with shortness of breath along with nausea and vomiting. Symptoms started the day prior while shoveling snow. Chest x-ray performed in the emergency department showed patchy airspace opacities that appear consistent with pneumonia. Initial troponin mildly elevated at 0.23 however markedly increased to 15.62 on repeat troponin. He was started on IV heparin and seen by cardiology. Given presence of ongoing chest pain he was started on IV nitroglycerin.  Objective: Filed Vitals:   11/03/15 1430 11/03/15 1445  BP: 136/92 136/90  Pulse: 94   Temp:    Resp: 42 34   No intake or output data in the 24 hours ending 11/03/15 1455 Filed Weights   11/02/15 2336   Weight: 77.111 kg (170 lb)  Exam:   General:  He reports ongoing CP and shortness of breath. Ill appearing.   Cardiovascular: Tachycardic, regular rate and rhythm, 2/6 SEM  Respiratory: Has bilateral crackles and rhonchi. Appears dyspneic at rest.   Abdomen: Soft, nontender nondistended  Musculoskeletal: He has pain with palpation over anterior chest wall.   Data Reviewed: Basic Metabolic Panel:  Recent Labs Lab 10/28/15 1138 11/03/15 0105 11/03/15 1109  NA 143 138 136  K 3.8 4.4 3.8  CL 109 103 103  CO2 25 25 25   GLUCOSE 238* 255* 261*  BUN 20 19 16   CREATININE 1.14 1.34* 1.11  CALCIUM 8.5* 8.9 9.0   Liver Function Tests:  Recent Labs Lab 11/03/15 1109  AST 101*  ALT 42  ALKPHOS 102  BILITOT 0.9  PROT 6.4*  ALBUMIN 3.0*   No results for input(s): LIPASE, AMYLASE in the last 168 hours. No results for input(s): AMMONIA in the last 168 hours. CBC:  Recent Labs Lab 11/03/15 0006 11/03/15 1109  WBC 8.9 10.5  NEUTROABS 6.8 8.3*  HGB 13.8 13.5  HCT 41.6 39.9  MCV 85.2 84.9  PLT 159 208   Cardiac Enzymes:  Recent Labs Lab 11/03/15 0105 11/03/15 1109  TROPONINI 0.23* 15.62*   BNP (last 3 results)  Recent Labs  10/14/15 0830 10/15/15 0350 11/03/15 0004  BNP 1219.9* 606.7* 571.9*    ProBNP (last 3 results) No results for input(s): PROBNP in the last 8760 hours.  CBG:  Recent Labs Lab 11/03/15 1257  GLUCAP 199*    No results found for this or any previous visit (from the past 240 hour(s)).   Studies: Dg Chest 2 View  11/03/2015  CLINICAL DATA:  Central and RIGHT-sided chest pain or shortness of breath tonight. History of hypertension, diabetes, atrial fibrillation, pneumonia, renal failure, CHF. EXAM: CHEST  2 VIEW COMPARISON:  Chest radiograph October 15, 2015 FINDINGS: Cardiomediastinal silhouette is unremarkable. Diffuse interstitial prominence with patchy airspace opacities, predominant LEFT lung base. No pleural effusion. No  pneumothorax. Soft tissue planes and included osseous structures are nonsuspicious. IMPRESSION: Interstitial prominence, with patchy airspace opacities concerning for bronchopneumonia. Followup PA and lateral chest X-ray is recommended in 3-4 weeks following trial of antibiotic therapy to ensure resolution and exclude underlying malignancy. Electronically Signed   By: 01/01/2016 M.D.   On: 11/03/2015 00:35   Ct Angio Chest Aorta W/cm &/or Wo/cm  11/03/2015  CLINICAL DATA:  55 year old male with central chest pain. EXAM: CT ANGIOGRAPHY CHEST WITH CONTRAST TECHNIQUE: Multidetector CT imaging of the chest was performed using the standard protocol during bolus administration of intravenous contrast. Multiplanar CT image reconstructions and MIPs were obtained to evaluate the vascular anatomy. CONTRAST:  01/01/2016 OMNIPAQUE IOHEXOL 350 MG/ML SOLN COMPARISON:  Radiograph dated 11/02/2015 FINDINGS: There are small bilateral pleural effusions. There is diffuse interstitial prominence with patchy areas of ground-glass airspace opacity predominantly involving the lower lobes compatible with pneumonia. Clinical correlation and follow-up resolution is recommended. There is no pneumothorax. The central airways are patent. The thoracic aorta appears unremarkable. Evaluation of the pulmonary arteries is limited due to suboptimal opacification of the peripheral branches. No central pulmonary artery embolus identified. There is no cardiomegaly or pericardial effusion. Top-normal bilateral hilar lymph nodes. There is coronary vascular calcification. The visualized esophagus and thyroid gland appear grossly unremarkable. There is no axillary adenopathy. The chest wall soft tissues appear unremarkable. Mild degenerative changes of the spine. No acute fracture. The visualized upper abdomen is unremarkable. Review of the MIP images confirms  the above findings. IMPRESSION: No CT evidence of pulmonary embolism or aortic dissection.  Small bilateral pleural effusions and diffuse bilateral ground-glass airspace opacities most compatible with pneumonia. Clinical correlation and follow-up recommended. Electronically Signed   By: Elgie Collard M.D.   On: 11/03/2015 06:49    Scheduled Meds: . aspirin EC  325 mg Oral Daily  . atorvastatin  80 mg Oral QPM  . digoxin  0.125 mg Oral Daily  . furosemide  40 mg Oral Daily  . glipiZIDE  5 mg Oral BID AC  . hydrALAZINE  75 mg Oral TID  . insulin aspart  0-9 Units Subcutaneous TID WC  . ipratropium-albuterol  3 mL Nebulization BID  . labetalol  100 mg Oral TID  . pantoprazole  40 mg Oral Daily  . potassium chloride SA  20 mEq Oral Daily  . spironolactone  25 mg Oral Daily   Continuous Infusions: . ceFEPime (MAXIPIME) IV    . heparin 1,450 Units/hr (11/03/15 1443)  . nitroGLYCERIN 5 mcg/min (11/03/15 1254)  . vancomycin      Principal Problem:   Acute respiratory failure with hypoxia (HCC) Active Problems:   Cardiomyopathy, ischemic   Chest pain   PAF (paroxysmal atrial fibrillation) (HCC)   Type 2 diabetes mellitus with vascular disease (HCC)    Time spent:     Jeralyn Bennett  [facility] Pager [phone]. If 7PM-7AM, please contact night-coverage at [email or web address], password [facility] 11/03/2015, 2:55 PM  LOS: 0 days

## 2015-11-03 NOTE — H&P (View-Only) (Signed)
CARDIOLOGY CONSULT NOTE   Patient ID: Sean Hinton MRN: [id number] DOB/AGE: [birth year] 55 y.o.  Admit date: 11/02/2015  Primary Physician   Lora Paula, MD Primary Cardiologist   Dr. Royann Shivers  Reason for Consultation  Chest pain  HPI: Sean Hinton is a 55 y.o. male with a history of CAD, ischemic CM (EF 35-40%), mod MR, PAFib/flutter on Xarelto, HTN, HLD, uncontrolled T2DM and ongoing tobacco abuse/cocaine use who presented to [facility] on 11/02/15 with chest pain and SOB.  He was admitted to [facility] in 02/2015 for syncope in the setting of atrial flutter with RVR. 2D Echo showed EF 40-45% with severe HK of the apicalanteroseptal, anterior, inferior, and apical myocardium. Given WMA and family history of CAD, the decision was made to pursue cardiac catheterization. Cardiac cath 02/24/15 showed:  Severe 2 vessel disease with 100% CTO of the mid LAD with diffuse disease in the PDA.  Mid LAD lesion, 100% stenosed. There is a 100% residual stenosis post intervention.  RPDA-1 lesion, 50% stenosed.  RPDA-2 lesion, 70% stenosed.  Ost 1st Mrg to 1st Mrg lesion, 80% stenosed. Very small vessel EF 35-40% by visual estimate  Attempt at PCI of LAD CTO failed and he was felt best treated with medical therapy.Subsequent cardiac MRI showed a left ventricular EF of 46% and late gadolinium enhancement suggested that the territory downstream of the LAD artery occlusion was mostly scar, unlikely to benefit from revascularization. Retrospectively, he believes he had a myocardial infarction months before which he treated as "indigestion".  He was last seen by Dr. Royann Shivers 03/2015 and felt to be doing well from a cardiac standpoint.   He was admitted to [facility] in 09/2015 for acute respiratory failure in the setting of multilobar PNA, CHF exacerbation, afib with RVR and recent cocaine use. Troponin was elevated in a pattern c/w demand ischemia. He was placed on digoxin in the setting of RVR. 2D ECHO  during this admission with EF 35-40%, mild LVH, severe HK in apicalanteroseptal and apical myocardium, mild MR, mild LA dilation. Mildly increased RV wall thickness.  He had been in his usual state of health until yesterday when he was shoveling snow on an incline. He has sudden onset of chest pain and SOB. However, he had shoveled the two previous days and not had any issues. Of note, he did do cocaine for the first time since last December on Friday (10/31/15). He was up all last night due to chest pain and SOB. He is very sleepy currently and falls asleep frequently during my interview. He states that he has 7/10 chest pain currently.     Past Medical History  Diagnosis Date  . Diabetes mellitus with complication (HCC)   . Hypertension   . Syncope     a. 02/2015 - felt 2/2 rapid AF/AFL.  Marland Kitchen Atrial fibrillation and flutter (HCC)     a. Dx 02/2015 - placed on Xarelto; b. 09/2015 recurrence in setting of PNA.  . Ischemic cardiomyopathy     a. 09/2015 Echo: EF 35-40%.  Marland Kitchen CAD (coronary artery disease)     a. Dx 02/2015 - 2V CAD with CTO LAD, diffuse dz in PDA, OM - med rx.  . Hypertensive heart disease   . Hyperlipidemia   . Mitral regurgitation     a. Mod by echo 02/2015,  . Tobacco abuse   . Chronic systolic CHF (congestive heart failure) (HCC)     a. 09/2015 Echo: EF 35-40%, sev apical/antlat, apical HK,  mild MR, mildly dil LA.  Marland Kitchen ARF (acute respiratory failure) (HCC)   . Pneumonia 09/2015  . GERD (gastroesophageal reflux disease)   . Headache   . Arthritis     ra     Past Surgical History  Procedure Laterality Date  . Right thumb surgery     . Cardiac catheterization N/A 02/24/2015    Procedure: Left Heart Cath and Coronary Angiography;  Surgeon: Marykay Lex, MD;  Location: [facility] INVASIVE CV LAB CUPID;  Service: Cardiovascular;  Laterality: N/A;  . Cardiac catheterization  02/24/2015    Procedure: Coronary/Bypass Graft CTO Intervention;  Surgeon: Marykay Lex, MD;  Location: [facility]  INVASIVE CV LAB CUPID;  Service: Cardiovascular;;    No Known Allergies  I have reviewed the patient's current medications . nitroGLYCERIN  0.5 inch Topical 4 times per day   . ceFEPime (MAXIPIME) IV    . heparin 1,200 Units/hr (11/03/15 0549)  . vancomycin     nitroGLYCERIN  Prior to Admission medications   Medication Sig Start Date End Date Taking? Authorizing Provider  albuterol (PROVENTIL HFA;VENTOLIN HFA) 108 (90 BASE) MCG/ACT inhaler Inhale 2 puffs into the lungs every 6 (six) hours as needed for wheezing or shortness of breath. 10/17/15  Yes Ripudeep Jenna Luo, MD  atorvastatin (LIPITOR) 80 MG tablet Take 1 tablet (80 mg total) by mouth every evening. 10/17/15  Yes Ripudeep Jenna Luo, MD  Blood Glucose Monitoring Suppl (TRUE METRIX METER) DEVI 1 Device by Does not apply route 3 (three) times daily. 10/24/15  Yes Jaclyn Shaggy, MD  Dexlansoprazole (DEXILANT) 30 MG capsule Take 1 capsule (30 mg total) by mouth daily. 12/30/14  Yes Josalyn Funches, MD  digoxin (LANOXIN) 0.125 MG tablet Take 1 tablet (0.125 mg total) by mouth daily. 10/17/15  Yes Ripudeep Jenna Luo, MD  diltiazem (CARDIZEM CD) 240 MG 24 hr capsule Take 1 capsule (240 mg total) by mouth daily. 10/17/15  Yes Ripudeep Jenna Luo, MD  furosemide (LASIX) 40 MG tablet Take 1 tablet (40 mg total) by mouth daily. 02/25/15  Yes Dayna N Dunn, PA-C  glipiZIDE (GLUCOTROL) 5 MG tablet Take 1 tablet (5 mg total) by mouth 2 (two) times daily before a meal. 10/17/15  Yes Ripudeep K Rai, MD  glucose blood (TRUE METRIX BLOOD GLUCOSE TEST) test strip Use as instructed 10/24/15  Yes Jaclyn Shaggy, MD  hydrALAZINE (APRESOLINE) 50 MG tablet Take 1.5 tablets (75 mg total) by mouth 3 (three) times daily. 10/17/15  Yes Ripudeep K Rai, MD  ipratropium-albuterol (DUONEB) 0.5-2.5 (3) MG/3ML SOLN Take 3 mLs by nebulization 2 (two) times daily. 10/17/15  Yes Ripudeep Jenna Luo, MD  isosorbide mononitrate (IMDUR) 30 MG 24 hr tablet Take 1 tablet (30 mg total) by mouth daily.  10/17/15  Yes Ripudeep Jenna Luo, MD  nitroGLYCERIN (NITROSTAT) 0.4 MG SL tablet Place 1 tablet (0.4 mg total) under the tongue every 5 (five) minutes as needed for chest pain (up to 3 doses). 02/25/15  Yes Dayna N Dunn, PA-C  potassium chloride SA (K-DUR,KLOR-CON) 20 MEQ tablet Take 1 tablet (20 mEq total) by mouth daily. 10/17/15  Yes Ripudeep Jenna Luo, MD  rivaroxaban (XARELTO) 20 MG TABS tablet Take 1 tablet (20 mg total) by mouth daily with supper. 02/25/15  Yes Dayna N Dunn, PA-C  spironolactone (ALDACTONE) 25 MG tablet Take 1 tablet (25 mg total) by mouth daily. 02/25/15  Yes Dayna N Dunn, PA-C  TRUEPLUS LANCETS 28G MISC 1 each by Does not apply route 3 (three)  times daily. 10/24/15  Yes Jaclyn Shaggy, MD     Social History   Social History  . Marital Status: Single    Spouse Name: N/A  . Number of Children: N/A  . Years of Education: N/A   Occupational History  . Not on file.   Social History Main Topics  . Smoking status: Current Every Day Smoker -- 0.25 packs/day for 30 years    Types: Cigarettes  . Smokeless tobacco: Never Used  . Alcohol Use: No  . Drug Use: Yes    Special: Cocaine     Comment: "last use 1991", Positive UDS 10/2015  . Sexual Activity: Not on file   Other Topics Concern  . Not on file   Social History Narrative    Family Status  Relation Status Death Age  . Mother Alive   . Father Deceased    Family History  Problem Relation Age of Onset  . Diabetes Mother   . Hypertension Mother   . Heart disease Mother   . Cancer Mother     stomach cancer   . Diabetes Father   . Hypertension Father   . Heart disease Father   . Diabetes Sister   . Hypertension Sister   . Cancer Sister     breast cancer   . Diabetes Brother   . Hypertension Brother   . Heart disease Brother   . Stroke Brother   . Heart attack Mother   . Heart attack Father   . Heart attack Brother      ROS:  Full 14 point review of systems complete and found to be negative unless listed  above.  Physical Exam: Blood pressure 136/89, pulse 88, temperature 98.1 F (36.7 C), temperature source Oral, resp. rate 39, height 5\' 10"  (1.778 m), weight 170 lb (77.111 kg), SpO2 87 %.  General: Well developed, well nourished, male . Somnolent and tachypnic Head: Eyes PERRLA, No xanthomas.   Normocephalic and atraumatic, oropharynx without edema or exudate. Dentition:  Lungs: course breath sounds Heart: HRRR S1 S2, no rub/gallop, Heart irregular rate and rhythm with S1, S2  murmur. pulses are 2+ extrem.   Neck: No carotid bruits. No lymphadenopathy.  No JVD. Abdomen: Bowel sounds present, abdomen soft and non-tender without masses or hernias noted. Msk:  No spine or cva tenderness. No weakness, no joint deformities or effusions. Extremities: No clubbing or cyanosis. No edema.  Neuro: Alert and oriented X 3. No focal deficits noted. Psych:  Good affect, responds appropriately Skin: No rashes or lesions noted.  Labs:  Lab Results  Component Value Date   WBC 8.9 11/03/2015   HGB 13.8 11/03/2015   HCT 41.6 11/03/2015   MCV 85.2 11/03/2015   PLT 159 11/03/2015    Recent Labs  11/03/15 0401  INR 1.36    Recent Labs Lab 11/03/15 0105  NA 138  K 4.4  CL 103  CO2 25  BUN 19  CREATININE 1.34*  CALCIUM 8.9  GLUCOSE 255*   MAGNESIUM  Date Value Ref Range Status  10/15/2015 1.9 1.7 - 2.4 mg/dL Final    Recent Labs  10/17/2015 0105  TROPONINI 0.23*    Recent Labs  11/03/15 0004  TROPIPOC 0.30*   No results found for: PROBNP Lab Results  Component Value Date   CHOL 128 10/13/2015   HDL 35* 10/13/2015   LDLCALC 80 10/13/2015   TRIG 67 10/13/2015   Lab Results  Component Value Date   DDIMER 0.56* 10/13/2015  Echo: 10/14/2015 LV EF: 35% -  40% Study Conclusions - Left ventricle: The cavity size was normal. Wall thickness was increased in a pattern of mild LVH. Systolic function was moderately reduced. The estimated ejection fraction was in  the range of 35% to 40%. Severe hypokinesis of the apicalanteroseptal and apical myocardium. - Mitral valve: There was mild regurgitation. - Left atrium: The atrium was mildly dilated. - Right ventricle: The cavity size was normal. Wall thickness was mildly increased.  ECG:  HR 82: Sinus rhythm Right atrial enlargement Anterior infarct, old. LVH  Radiology:  Dg Chest 2 View  11/03/2015  CLINICAL DATA:  Central and RIGHT-sided chest pain or shortness of breath tonight. History of hypertension, diabetes, atrial fibrillation, pneumonia, renal failure, CHF. EXAM: CHEST  2 VIEW COMPARISON:  Chest radiograph October 15, 2015 FINDINGS: Cardiomediastinal silhouette is unremarkable. Diffuse interstitial prominence with patchy airspace opacities, predominant LEFT lung base. No pleural effusion. No pneumothorax. Soft tissue planes and included osseous structures are nonsuspicious. IMPRESSION: Interstitial prominence, with patchy airspace opacities concerning for bronchopneumonia. Followup PA and lateral chest X-ray is recommended in 3-4 weeks following trial of antibiotic therapy to ensure resolution and exclude underlying malignancy. Electronically Signed   By: Awilda Metro M.D.   On: 11/03/2015 00:35   Ct Angio Chest Aorta W/cm &/or Wo/cm  11/03/2015  CLINICAL DATA:  55 year old male with central chest pain. EXAM: CT ANGIOGRAPHY CHEST WITH CONTRAST TECHNIQUE: Multidetector CT imaging of the chest was performed using the standard protocol during bolus administration of intravenous contrast. Multiplanar CT image reconstructions and MIPs were obtained to evaluate the vascular anatomy. CONTRAST:  OMNIPAQUE IOHEXOL 350 MG/ML SOLN COMPARISON:  Radiograph dated 11/02/2015 FINDINGS: There are small bilateral pleural effusions. There is diffuse interstitial prominence with patchy areas of ground-glass airspace opacity predominantly involving the lower lobes compatible with pneumonia. Clinical  correlation and follow-up resolution is recommended. There is no pneumothorax. The central airways are patent. The thoracic aorta appears unremarkable. Evaluation of the pulmonary arteries is limited due to suboptimal opacification of the peripheral branches. No central pulmonary artery embolus identified. There is no cardiomegaly or pericardial effusion. Top-normal bilateral hilar lymph nodes. There is coronary vascular calcification. The visualized esophagus and thyroid gland appear grossly unremarkable. There is no axillary adenopathy. The chest wall soft tissues appear unremarkable. Mild degenerative changes of the spine. No acute fracture. The visualized upper abdomen is unremarkable. Review of the MIP images confirms the above findings. IMPRESSION: No CT evidence of pulmonary embolism or aortic dissection. Small bilateral pleural effusions and diffuse bilateral ground-glass airspace opacities most compatible with pneumonia. Clinical correlation and follow-up recommended. Electronically Signed   By: Elgie Collard M.D.   On: 11/03/2015 06:49    ASSESSMENT AND PLAN:    Principal Problem:   Acute respiratory failure with hypoxia (HCC) Active Problems:   Cardiomyopathy, ischemic   Chest pain   PAF (paroxysmal atrial fibrillation) (HCC)   Type 2 diabetes mellitus with vascular disease (HCC)  Sean Hinton is a 55 y.o. male with a history of CAD, ischemic CM (EF 35-40%), mod MR, PAFib/flutter on Xarelto, HTN, HLD, uncontrolled T2DM and ongoing tobacco abuse/cocaine use who presented to [facility] on 11/02/15 with chest pain and SOB   Chest pain/NSTEMI: will start IV NTG for chest pain and BP. Previous LHC with CTO of LAD and subsequent MRI with mostly scar in LAD territory. Not a candidate for revascularization. Scattered diffuse disease in the PDA and oblique marginal branches, medical therapy recommended. He  is currently on IV heparin. Okay to continue. We will follow troponin trend.   Chronic combined  systolic and diastolic heart failure: His BNP is slightly elevated but lower than at discharge in 09/2015. Will hold of diuretics for now. Okay to continue his home lasix. Continue spiro 25mg  daily. BBs previously stopped due to cocaine use and previously on Lisinopril 40mg  (not on anymore due to unclear reasons).  Possible PNA: on ABx per IM  PAFib/flutter: will stop dilt in the setting of LV dysfunction. Continue Xarelto and digoxin. He remains in NSR currently.   HTN: BPs not well controlled: will start IV NTG and labetalol 100 TID, titrate up for BP. As above stop dilt due to LV dysfunction  HLD: continue statin  Diabetes mellitus type 2 on oral anti-diabetics with improving control - Last hemoglobin A1c 8.4  Moderate mitral insufficiency: likely secondary to cardiomyopathy  Tobacco abuse: still smoking. Discussed cessation   Recurrent cocaine abuse: BBs previously stopped due to this. We will start on labetalol for now with plans to switch to Coreg at discharge. He needs to stop doing cocaine.   Signed , PA-C 11/03/2015 9:23 AM  Pager Janetta Hora  Co-Sign MD  I have seen and examined the patient along with 01/01/2016, PA-C.  I have reviewed the chart, notes and new data.  I agree with PA's note.  Key new complaints: sleepy after receiving morphine and lorazepam this AM. Does not appear to be in distress, although when he wakes up he reports he still has chest pain Key examination changes: very tachypneic (35-40/min) and hypoxic (85% on RA, 95% on 4L O2 by cannula); bilateral diffuse reduced breath sounds and some (dry?) crackles. No cardiac abnormalities on exam. Severely elevated BP, especially high DBP >110 mm Hg. Key new findings / data: CXR with "bronchopneumonia" (acute HF? Incipient pulmonary edema?). NSR. UDS +ve for cocaine (he reports using it on Friday) and opiates (took 2 x Tylenol #3 from his girlfriend). EF 35-40% by last echo in December  with known scar in territory of chronic LAD artery occlusion.  PLAN: 1. Add iv NTG for CHF/angina and HBP 2. Add labetalol for BP control 3. Notwithstanding history of rapid atrial flutter, diltiazem is contraindicated with moderately depressed EF. Will restart full beta blockade with a combine alpha beta blocker (carvedilol) in another couple of days to allow cocaine/metabolites to be eliminated. If needed, can use amiodarone for rate control. 4. I am not sure to what degree his hypoxemia is due to "bronchopneumonia" (no fever, normal WBC) versus pulmonary edema (cardiogenic, cocaine). 5. ECG without signs of new ischemia, marginal elevation in troponin, recent cocaine use (increased demand +/- vasospasm?). At this point, invasive evaluation is not planned.  Friday, MD, [facility] [phone] 11/03/2015, 11:36 AM

## 2015-11-03 NOTE — ED Notes (Signed)
Called Pharmacy to retime medication

## 2015-11-03 NOTE — Consult Note (Signed)
CARDIOLOGY CONSULT NOTE   Patient ID: Sean Hinton MRN: [id number] DOB/AGE: [birth year] 55 y.o.  Admit date: 11/02/2015  Primary Physician   Lora Paula, MD Primary Cardiologist   Dr. Royann Shivers  Reason for Consultation  Chest pain  HPI: Sean Hinton is a 55 y.o. male with a history of CAD, ischemic CM (EF 35-40%), mod MR, PAFib/flutter on Xarelto, HTN, HLD, uncontrolled T2DM and ongoing tobacco abuse/cocaine use who presented to [facility] on 11/02/15 with chest pain and SOB.  He was admitted to [facility] in 02/2015 for syncope in the setting of atrial flutter with RVR. 2D Echo showed EF 40-45% with severe HK of the apicalanteroseptal, anterior, inferior, and apical myocardium. Given WMA and family history of CAD, the decision was made to pursue cardiac catheterization. Cardiac cath 02/24/15 showed:  Severe 2 vessel disease with 100% CTO of the mid LAD with diffuse disease in the PDA.  Mid LAD lesion, 100% stenosed. There is a 100% residual stenosis post intervention.  RPDA-1 lesion, 50% stenosed.  RPDA-2 lesion, 70% stenosed.  Ost 1st Mrg to 1st Mrg lesion, 80% stenosed. Very small vessel EF 35-40% by visual estimate  Attempt at PCI of LAD CTO failed and he was felt best treated with medical therapy.Subsequent cardiac MRI showed a left ventricular EF of 46% and late gadolinium enhancement suggested that the territory downstream of the LAD artery occlusion was mostly scar, unlikely to benefit from revascularization. Retrospectively, he believes he had a myocardial infarction months before which he treated as "indigestion".  He was last seen by Dr. Royann Shivers 03/2015 and felt to be doing well from a cardiac standpoint.   He was admitted to [facility] in 09/2015 for acute respiratory failure in the setting of multilobar PNA, CHF exacerbation, afib with RVR and recent cocaine use. Troponin was elevated in a pattern c/w demand ischemia. He was placed on digoxin in the setting of RVR. 2D ECHO  during this admission with EF 35-40%, mild LVH, severe HK in apicalanteroseptal and apical myocardium, mild MR, mild LA dilation. Mildly increased RV wall thickness.  He had been in his usual state of health until yesterday when he was shoveling snow on an incline. He has sudden onset of chest pain and SOB. However, he had shoveled the two previous days and not had any issues. Of note, he did do cocaine for the first time since last December on Friday (10/31/15). He was up all last night due to chest pain and SOB. He is very sleepy currently and falls asleep frequently during my interview. He states that he has 7/10 chest pain currently.     Past Medical History  Diagnosis Date  . Diabetes mellitus with complication (HCC)   . Hypertension   . Syncope     a. 02/2015 - felt 2/2 rapid AF/AFL.  Marland Kitchen Atrial fibrillation and flutter (HCC)     a. Dx 02/2015 - placed on Xarelto; b. 09/2015 recurrence in setting of PNA.  . Ischemic cardiomyopathy     a. 09/2015 Echo: EF 35-40%.  Marland Kitchen CAD (coronary artery disease)     a. Dx 02/2015 - 2V CAD with CTO LAD, diffuse dz in PDA, OM - med rx.  . Hypertensive heart disease   . Hyperlipidemia   . Mitral regurgitation     a. Mod by echo 02/2015,  . Tobacco abuse   . Chronic systolic CHF (congestive heart failure) (HCC)     a. 09/2015 Echo: EF 35-40%, sev apical/antlat, apical HK,  mild MR, mildly dil LA.  . ARF (acute respiratory failure) (HCC)   . Pneumonia 09/2015  . GERD (gastroesophageal reflux disease)   . Headache   . Arthritis     ra     Past Surgical History  Procedure Laterality Date  . Right thumb surgery     . Cardiac catheterization N/A 02/24/2015    Procedure: Left Heart Cath and Coronary Angiography;  Surgeon: David W Harding, MD;  Location: [facility] INVASIVE CV LAB CUPID;  Service: Cardiovascular;  Laterality: N/A;  . Cardiac catheterization  02/24/2015    Procedure: Coronary/Bypass Graft CTO Intervention;  Surgeon: David W Harding, MD;  Location: [facility]  INVASIVE CV LAB CUPID;  Service: Cardiovascular;;    No Known Allergies  I have reviewed the patient's current medications . nitroGLYCERIN  0.5 inch Topical 4 times per day   . ceFEPime (MAXIPIME) IV    . heparin 1,200 Units/hr (11/03/15 0549)  . vancomycin     nitroGLYCERIN  Prior to Admission medications   Medication Sig Start Date End Date Taking? Authorizing Provider  albuterol (PROVENTIL HFA;VENTOLIN HFA) 108 (90 BASE) MCG/ACT inhaler Inhale 2 puffs into the lungs every 6 (six) hours as needed for wheezing or shortness of breath. 10/17/15  Yes Ripudeep K Rai, MD  atorvastatin (LIPITOR) 80 MG tablet Take 1 tablet (80 mg total) by mouth every evening. 10/17/15  Yes Ripudeep K Rai, MD  Blood Glucose Monitoring Suppl (TRUE METRIX METER) DEVI 1 Device by Does not apply route 3 (three) times daily. 10/24/15  Yes Enobong Amao, MD  Dexlansoprazole (DEXILANT) 30 MG capsule Take 1 capsule (30 mg total) by mouth daily. 12/30/14  Yes Josalyn Funches, MD  digoxin (LANOXIN) 0.125 MG tablet Take 1 tablet (0.125 mg total) by mouth daily. 10/17/15  Yes Ripudeep K Rai, MD  diltiazem (CARDIZEM CD) 240 MG 24 hr capsule Take 1 capsule (240 mg total) by mouth daily. 10/17/15  Yes Ripudeep K Rai, MD  furosemide (LASIX) 40 MG tablet Take 1 tablet (40 mg total) by mouth daily. 02/25/15  Yes Dayna N Dunn, PA-C  glipiZIDE (GLUCOTROL) 5 MG tablet Take 1 tablet (5 mg total) by mouth 2 (two) times daily before a meal. 10/17/15  Yes Ripudeep K Rai, MD  glucose blood (TRUE METRIX BLOOD GLUCOSE TEST) test strip Use as instructed 10/24/15  Yes Enobong Amao, MD  hydrALAZINE (APRESOLINE) 50 MG tablet Take 1.5 tablets (75 mg total) by mouth 3 (three) times daily. 10/17/15  Yes Ripudeep K Rai, MD  ipratropium-albuterol (DUONEB) 0.5-2.5 (3) MG/3ML SOLN Take 3 mLs by nebulization 2 (two) times daily. 10/17/15  Yes Ripudeep K Rai, MD  isosorbide mononitrate (IMDUR) 30 MG 24 hr tablet Take 1 tablet (30 mg total) by mouth daily.  10/17/15  Yes Ripudeep K Rai, MD  nitroGLYCERIN (NITROSTAT) 0.4 MG SL tablet Place 1 tablet (0.4 mg total) under the tongue every 5 (five) minutes as needed for chest pain (up to 3 doses). 02/25/15  Yes Dayna N Dunn, PA-C  potassium chloride SA (K-DUR,KLOR-CON) 20 MEQ tablet Take 1 tablet (20 mEq total) by mouth daily. 10/17/15  Yes Ripudeep K Rai, MD  rivaroxaban (XARELTO) 20 MG TABS tablet Take 1 tablet (20 mg total) by mouth daily with supper. 02/25/15  Yes Dayna N Dunn, PA-C  spironolactone (ALDACTONE) 25 MG tablet Take 1 tablet (25 mg total) by mouth daily. 02/25/15  Yes Dayna N Dunn, PA-C  TRUEPLUS LANCETS 28G MISC 1 each by Does not apply route 3 (three)   times daily. 10/24/15  Yes Jaclyn Shaggy, MD     Social History   Social History  . Marital Status: Single    Spouse Name: N/A  . Number of Children: N/A  . Years of Education: N/A   Occupational History  . Not on file.   Social History Main Topics  . Smoking status: Current Every Day Smoker -- 0.25 packs/day for 30 years    Types: Cigarettes  . Smokeless tobacco: Never Used  . Alcohol Use: No  . Drug Use: Yes    Special: Cocaine     Comment: "last use 1991", Positive UDS 10/2015  . Sexual Activity: Not on file   Other Topics Concern  . Not on file   Social History Narrative    Family Status  Relation Status Death Age  . Mother Alive   . Father Deceased    Family History  Problem Relation Age of Onset  . Diabetes Mother   . Hypertension Mother   . Heart disease Mother   . Cancer Mother     stomach cancer   . Diabetes Father   . Hypertension Father   . Heart disease Father   . Diabetes Sister   . Hypertension Sister   . Cancer Sister     breast cancer   . Diabetes Brother   . Hypertension Brother   . Heart disease Brother   . Stroke Brother   . Heart attack Mother   . Heart attack Father   . Heart attack Brother      ROS:  Full 14 point review of systems complete and found to be negative unless listed  above.  Physical Exam: Blood pressure 136/89, pulse 88, temperature 98.1 F (36.7 C), temperature source Oral, resp. rate 39, height 5\' 10"  (1.778 m), weight 170 lb (77.111 kg), SpO2 87 %.  General: Well developed, well nourished, male . Somnolent and tachypnic Head: Eyes PERRLA, No xanthomas.   Normocephalic and atraumatic, oropharynx without edema or exudate. Dentition:  Lungs: course breath sounds Heart: HRRR S1 S2, no rub/gallop, Heart irregular rate and rhythm with S1, S2  murmur. pulses are 2+ extrem.   Neck: No carotid bruits. No lymphadenopathy.  No JVD. Abdomen: Bowel sounds present, abdomen soft and non-tender without masses or hernias noted. Msk:  No spine or cva tenderness. No weakness, no joint deformities or effusions. Extremities: No clubbing or cyanosis. No edema.  Neuro: Alert and oriented X 3. No focal deficits noted. Psych:  Good affect, responds appropriately Skin: No rashes or lesions noted.  Labs:  Lab Results  Component Value Date   WBC 8.9 11/03/2015   HGB 13.8 11/03/2015   HCT 41.6 11/03/2015   MCV 85.2 11/03/2015   PLT 159 11/03/2015    Recent Labs  11/03/15 0401  INR 1.36    Recent Labs Lab 11/03/15 0105  NA 138  K 4.4  CL 103  CO2 25  BUN 19  CREATININE 1.34*  CALCIUM 8.9  GLUCOSE 255*   MAGNESIUM  Date Value Ref Range Status  10/15/2015 1.9 1.7 - 2.4 mg/dL Final    Recent Labs  10/17/2015 0105  TROPONINI 0.23*    Recent Labs  11/03/15 0004  TROPIPOC 0.30*   No results found for: PROBNP Lab Results  Component Value Date   CHOL 128 10/13/2015   HDL 35* 10/13/2015   LDLCALC 80 10/13/2015   TRIG 67 10/13/2015   Lab Results  Component Value Date   DDIMER 0.56* 10/13/2015  Echo: 10/14/2015 LV EF: 35% -  40% Study Conclusions - Left ventricle: The cavity size was normal. Wall thickness was increased in a pattern of mild LVH. Systolic function was moderately reduced. The estimated ejection fraction was in  the range of 35% to 40%. Severe hypokinesis of the apicalanteroseptal and apical myocardium. - Mitral valve: There was mild regurgitation. - Left atrium: The atrium was mildly dilated. - Right ventricle: The cavity size was normal. Wall thickness was mildly increased.  ECG:  HR 82: Sinus rhythm Right atrial enlargement Anterior infarct, old. LVH  Radiology:  Dg Chest 2 View  11/03/2015  CLINICAL DATA:  Central and RIGHT-sided chest pain or shortness of breath tonight. History of hypertension, diabetes, atrial fibrillation, pneumonia, renal failure, CHF. EXAM: CHEST  2 VIEW COMPARISON:  Chest radiograph October 15, 2015 FINDINGS: Cardiomediastinal silhouette is unremarkable. Diffuse interstitial prominence with patchy airspace opacities, predominant LEFT lung base. No pleural effusion. No pneumothorax. Soft tissue planes and included osseous structures are nonsuspicious. IMPRESSION: Interstitial prominence, with patchy airspace opacities concerning for bronchopneumonia. Followup PA and lateral chest X-ray is recommended in 3-4 weeks following trial of antibiotic therapy to ensure resolution and exclude underlying malignancy. Electronically Signed   By: Awilda Metro M.D.   On: 11/03/2015 00:35   Ct Angio Chest Aorta W/cm &/or Wo/cm  11/03/2015  CLINICAL DATA:  55 year old male with central chest pain. EXAM: CT ANGIOGRAPHY CHEST WITH CONTRAST TECHNIQUE: Multidetector CT imaging of the chest was performed using the standard protocol during bolus administration of intravenous contrast. Multiplanar CT image reconstructions and MIPs were obtained to evaluate the vascular anatomy. CONTRAST:  OMNIPAQUE IOHEXOL 350 MG/ML SOLN COMPARISON:  Radiograph dated 11/02/2015 FINDINGS: There are small bilateral pleural effusions. There is diffuse interstitial prominence with patchy areas of ground-glass airspace opacity predominantly involving the lower lobes compatible with pneumonia. Clinical  correlation and follow-up resolution is recommended. There is no pneumothorax. The central airways are patent. The thoracic aorta appears unremarkable. Evaluation of the pulmonary arteries is limited due to suboptimal opacification of the peripheral branches. No central pulmonary artery embolus identified. There is no cardiomegaly or pericardial effusion. Top-normal bilateral hilar lymph nodes. There is coronary vascular calcification. The visualized esophagus and thyroid gland appear grossly unremarkable. There is no axillary adenopathy. The chest wall soft tissues appear unremarkable. Mild degenerative changes of the spine. No acute fracture. The visualized upper abdomen is unremarkable. Review of the MIP images confirms the above findings. IMPRESSION: No CT evidence of pulmonary embolism or aortic dissection. Small bilateral pleural effusions and diffuse bilateral ground-glass airspace opacities most compatible with pneumonia. Clinical correlation and follow-up recommended. Electronically Signed   By: Elgie Collard M.D.   On: 11/03/2015 06:49    ASSESSMENT AND PLAN:    Principal Problem:   Acute respiratory failure with hypoxia (HCC) Active Problems:   Cardiomyopathy, ischemic   Chest pain   PAF (paroxysmal atrial fibrillation) (HCC)   Type 2 diabetes mellitus with vascular disease (HCC)  Sean Hinton is a 55 y.o. male with a history of CAD, ischemic CM (EF 35-40%), mod MR, PAFib/flutter on Xarelto, HTN, HLD, uncontrolled T2DM and ongoing tobacco abuse/cocaine use who presented to [facility] on 11/02/15 with chest pain and SOB   Chest pain/NSTEMI: will start IV NTG for chest pain and BP. Previous LHC with CTO of LAD and subsequent MRI with mostly scar in LAD territory. Not a candidate for revascularization. Scattered diffuse disease in the PDA and oblique marginal branches, medical therapy recommended. He  is currently on IV heparin. Okay to continue. We will follow troponin trend.   Chronic combined  systolic and diastolic heart failure: His BNP is slightly elevated but lower than at discharge in 09/2015. Will hold of diuretics for now. Okay to continue his home lasix. Continue spiro 25mg  daily. BBs previously stopped due to cocaine use and previously on Lisinopril 40mg  (not on anymore due to unclear reasons).  Possible PNA: on ABx per IM  PAFib/flutter: will stop dilt in the setting of LV dysfunction. Continue Xarelto and digoxin. He remains in NSR currently.   HTN: BPs not well controlled: will start IV NTG and labetalol 100 TID, titrate up for BP. As above stop dilt due to LV dysfunction  HLD: continue statin  Diabetes mellitus type 2 on oral anti-diabetics with improving control - Last hemoglobin A1c 8.4  Moderate mitral insufficiency: likely secondary to cardiomyopathy  Tobacco abuse: still smoking. Discussed cessation   Recurrent cocaine abuse: BBs previously stopped due to this. We will start on labetalol for now with plans to switch to Coreg at discharge. He needs to stop doing cocaine.   Signed , PA-C 11/03/2015 9:23 AM  Pager Janetta Hora  Co-Sign MD  I have seen and examined the patient along with 01/01/2016, PA-C.  I have reviewed the chart, notes and new data.  I agree with PA's note.  Key new complaints: sleepy after receiving morphine and lorazepam this AM. Does not appear to be in distress, although when he wakes up he reports he still has chest pain Key examination changes: very tachypneic (35-40/min) and hypoxic (85% on RA, 95% on 4L O2 by cannula); bilateral diffuse reduced breath sounds and some (dry?) crackles. No cardiac abnormalities on exam. Severely elevated BP, especially high DBP >110 mm Hg. Key new findings / data: CXR with "bronchopneumonia" (acute HF? Incipient pulmonary edema?). NSR. UDS +ve for cocaine (he reports using it on Friday) and opiates (took 2 x Tylenol #3 from his girlfriend). EF 35-40% by last echo in December  with known scar in territory of chronic LAD artery occlusion.  PLAN: 1. Add iv NTG for CHF/angina and HBP 2. Add labetalol for BP control 3. Notwithstanding history of rapid atrial flutter, diltiazem is contraindicated with moderately depressed EF. Will restart full beta blockade with a combine alpha beta blocker (carvedilol) in another couple of days to allow cocaine/metabolites to be eliminated. If needed, can use amiodarone for rate control. 4. I am not sure to what degree his hypoxemia is due to "bronchopneumonia" (no fever, normal WBC) versus pulmonary edema (cardiogenic, cocaine). 5. ECG without signs of new ischemia, marginal elevation in troponin, recent cocaine use (increased demand +/- vasospasm?). At this point, invasive evaluation is not planned.  Friday, MD, [facility] [phone] 11/03/2015, 11:36 AM

## 2015-11-03 NOTE — Progress Notes (Addendum)
ANTICOAGULATION CONSULT NOTE - Initial Consult  Pharmacy Consult for Heparin Indication: chest pain/ACS  No Known Allergies  Patient Measurements: Height: 5\' 10"  (177.8 cm) Weight: 170 lb (77.111 kg) IBW/kg (Calculated) : 73  Vital Signs: Temp: 98.1 F (36.7 C) (01/08 2336) Temp Source: Oral (01/08 2336) BP: 146/107 mmHg (01/09 0300) Pulse Rate: 77 (01/09 0300)  Labs:  Recent Labs  11/03/15 0006 11/03/15 0105  HGB 13.8  --   HCT 41.6  --   PLT 159  --   APTT 33  --   LABPROT 17.2*  --   INR 1.40  --   CREATININE  --  1.34*  TROPONINI  --  0.23*    Estimated Creatinine Clearance: 66.6 mL/min (by C-G formula based on Cr of 1.34).   Medical History: Past Medical History  Diagnosis Date  . Diabetes mellitus with complication (HCC)   . Hypertension   . Syncope     a. 02/2015 - felt 2/2 rapid AF/AFL.  03/2015 Atrial fibrillation and flutter (HCC)     a. Dx 02/2015 - placed on Xarelto; b. 09/2015 recurrence in setting of PNA.  . Ischemic cardiomyopathy     a. 09/2015 Echo: EF 35-40%.  10/2015 CAD (coronary artery disease)     a. Dx 02/2015 - 2V CAD with CTO LAD, diffuse dz in PDA, OM - med rx.  . Hypertensive heart disease   . Hyperlipidemia   . Mitral regurgitation     a. Mod by echo 02/2015,  . Tobacco abuse   . Chronic systolic CHF (congestive heart failure) (HCC)     a. 09/2015 Echo: EF 35-40%, sev apical/antlat, apical HK, mild MR, mildly dil LA.  10/2015 ARF (acute respiratory failure) (HCC)   . Pneumonia 09/2015  . GERD (gastroesophageal reflux disease)   . Headache   . Arthritis     ra    Medications:  Albuterol  Lipitor  Dexilant  Cardizem  Lasix  Glucotrol  Hydralazine  Duoneb  Imdur  Ntg  KCl  Xarelto  Aldactone    Assessment: 55 y.o. male admitted with chest pain, h/o Afib and Xarelto on hold, for heparin  Last dose of Xarelto 9 pm 1/8   Goal of Therapy:  Heparin level 0.3-0.7 units/ml Monitor platelets by anticoagulation protocol: Yes   Plan:  Start  heparin 1200 units/hr tonight at 8 pm  Check aPTT/heparin level 8 hours after starting.  F/U baseline labs  Clydie Dillen, 46 11/03/2015,3:15 AM  Addendum: Baseline labs resulted, and heparin level reported as < 0.1.  Will start heparin now with bolus.  01/01/2016, PharmD, BCPS 11/03/2015 5:10 AM

## 2015-11-03 NOTE — ED Notes (Signed)
resp therapy called for CPAP

## 2015-11-03 NOTE — ED Notes (Signed)
Pt CBG, 199. Nurse was notified.

## 2015-11-03 NOTE — Progress Notes (Signed)
ANTIBIOTIC CONSULT NOTE - INITIAL  Pharmacy Consult for Cefepime and vancomycin Indication: HAP  No Known Allergies  Patient Measurements: Height: 5\' 10"  (177.8 cm) Weight: 170 lb (77.111 kg) IBW/kg (Calculated) : 73  Vital Signs: Temp: 98.1 F (36.7 C) (01/08 2336) Temp Source: Oral (01/08 2336) BP: 136/89 mmHg (01/09 0800) Pulse Rate: 88 (01/09 0715) Intake/Output from previous day:   Intake/Output from this shift:    Labs:  Recent Labs  11/03/15 0006 11/03/15 0105  WBC 8.9  --   HGB 13.8  --   PLT 159  --   CREATININE  --  1.34*   Estimated Creatinine Clearance: 66.6 mL/min (by C-G formula based on Cr of 1.34). No results for input(s): VANCOTROUGH, VANCOPEAK, VANCORANDOM, GENTTROUGH, GENTPEAK, GENTRANDOM, TOBRATROUGH, TOBRAPEAK, TOBRARND, AMIKACINPEAK, AMIKACINTROU, AMIKACIN in the last 72 hours.   Microbiology: Recent Results (from the past 720 hour(s))  Culture, blood (routine x 2) Call MD if unable to obtain prior to antibiotics being given     Status: None   Collection Time: Sean/19/16  7:00 AM  Result Value Ref Range Status   Specimen Description BLOOD RIGHT FOREARM  Final   Special Requests BOTTLES DRAWN AEROBIC AND ANAEROBIC Sean/21/16  Final   Culture NO GROWTH 5 DAYS  Final   Report Status Sean/24/2016 FINAL  Final  Culture, blood (routine x 2) Call MD if unable to obtain prior to antibiotics being given     Status: None   Collection Time: Sean/19/16  7:20 AM  Result Value Ref Range Status   Specimen Description BLOOD LEFT HAND  Final   Special Requests BOTTLES DRAWN AEROBIC AND ANAEROBIC Sean/21/16  Final   Culture NO GROWTH 5 DAYS  Final   Report Status Sean/24/2016 FINAL  Final  MRSA PCR Screening     Status: None   Collection Time: Sean/19/16  1:Sean PM  Result Value Ref Range Status   MRSA by PCR NEGATIVE NEGATIVE Final    Comment:        The GeneXpert MRSA Assay (FDA approved for NASAL specimens only), is one component of a comprehensive MRSA  colonization surveillance program. It is not intended to diagnose MRSA infection nor to guide or monitor treatment for MRSA infections.   Culture, sputum-assessment     Status: None   Collection Time: Sean/20/16 Sean:43 AM  Result Value Ref Range Status   Specimen Description EXPECTORATED SPUTUM  Final   Special Requests NONE  Final   Sputum evaluation   Final    THIS SPECIMEN IS ACCEPTABLE. RESPIRATORY CULTURE REPORT TO FOLLOW.   Report Status Sean/20/2016 FINAL  Final  Culture, respiratory (NON-Expectorated)     Status: None   Collection Time: Sean/20/16 Sean:43 AM  Result Value Ref Range Status   Specimen Description EXPECTORATED SPUTUM  Final   Special Requests NONE  Final   Gram Stain   Final    FEW WBC PRESENT,BOTH PMN AND MONONUCLEAR FEW SQUAMOUS EPITHELIAL CELLS PRESENT FEW GRAM NEGATIVE RODS Performed at Sean/22/16    Culture   Final    NORMAL OROPHARYNGEAL FLORA Performed at [facility]    Report Status Sean/22/2016 FINAL  Final  MRSA PCR Screening     Status: None   Collection Time: Sean/20/16 10:48 AM  Result Value Ref Range Status   MRSA by PCR NEGATIVE NEGATIVE Final    Comment:        The GeneXpert MRSA Assay (FDA approved for NASAL specimens only), is one component of a  comprehensive MRSA colonization surveillance program. It is not intended to diagnose MRSA infection nor to guide or monitor treatment for MRSA infections.   Respiratory virus panel     Status: None   Collection Time: Sean/20/16  1:11 PM  Result Value Ref Range Status   Source - RVPAN NASAL SWAB  Corrected   Respiratory Syncytial Virus A Negative Negative Final   Respiratory Syncytial Virus B Negative Negative Final   Influenza A Negative Negative Final   Influenza B Negative Negative Final   Parainfluenza 1 Negative Negative Final   Parainfluenza 2 Negative Negative Final   Parainfluenza 3 Negative Negative Final   Metapneumovirus Negative Negative Final   Rhinovirus  Negative Negative Final   Adenovirus Negative Negative Final    Comment: (NOTE) Performed At: [facility] [address] [place], [place] [id number] Mila Homer MD EF:[id number]     Medical History: Past Medical History  Diagnosis Date  . Diabetes mellitus with complication (HCC)   . Hypertension   . Syncope     a. 02/2015 - felt 2/2 rapid AF/AFL.  Marland Kitchen Atrial fibrillation and flutter (HCC)     a. Dx 02/2015 - placed on Xarelto; b. Sean/2016 recurrence in setting of PNA.  . Ischemic cardiomyopathy     a. Sean/2016 Echo: EF 35-40%.  Marland Kitchen CAD (coronary artery disease)     a. Dx 02/2015 - 2V CAD with CTO LAD, diffuse dz in PDA, OM - med rx.  . Hypertensive heart disease   . Hyperlipidemia   . Mitral regurgitation     a. Mod by echo 02/2015,  . Tobacco abuse   . Chronic systolic CHF (congestive heart failure) (HCC)     a. Sean/2016 Echo: EF 35-40%, sev apical/antlat, apical HK, mild MR, mildly dil LA.  Marland Kitchen ARF (acute respiratory failure) (HCC)   . Pneumonia Sean/2016  . GERD (gastroesophageal reflux disease)   . Headache   . Arthritis     ra    Assessment: 55 yo Hinton presents on 1/8 with chest pain and SOB. Troponin found to be mildly elevated and EKG shows nonspecific changes. CXR shows infiltrates concerning for PNA. Recently hospitalized about a month ago. Pharmacy consulted for abx and heparin. Blood cx's drawn. Cefepime and vanc x 1 given in the ED. Afebrile, WBC wnl. SCr elevated at 1.34, CrCl ~23ml/min.   Goal of Therapy:  Vancomycin trough level 15-20 mcg/ml  Resolution of infection  Plan:  Continue cefepime 2g IV Q12 Continue vancomycin 1g IV Q12 Monitor clinical picture, renal function, VT prn F/U C&S, abx deescalation / LOT  Enzo Bi, PharmD, BCPS Clinical Pharmacist Pager [phone] 11/03/2015 9:Sean AM

## 2015-11-03 NOTE — ED Notes (Signed)
This RN took over care of the patient at this time.

## 2015-11-03 NOTE — Progress Notes (Signed)
Despite normalization of BP (now 116/83) on IV NTG and labetalol, he continues to have 5/10 chest pain, dyspnea/tachypnea and appears uncomfortable. Troponin increased markedly to 15, consistent with true NSTEMI. Little change on ECG which shows prominent LVH related repolarization changes. I think diagnostic coronary angio and PCI if needed is indicated. This procedure has been fully reviewed with the patient and written informed consent has been obtained. In additional tio usual concerns, we reviewed the increased risk of bleeding with rivaroxaban dosed about 24 hours ago.  [name], MD, [facility] [phone] office [phone] pager

## 2015-11-03 NOTE — ED Notes (Signed)
Bipap removed by patient and stopped by RT. MD Aware

## 2015-11-03 NOTE — H&P (Signed)
[facility] History and Physical  Sean Hinton LTJ:[id number] DOB: Jan 30, 1961 DOA: 11/02/2015  Referring physician: Dr.Nanvati. PCP: Lora Paula, MD  Specialists: Dr. Royann Shivers.  Chief Complaint: Chest pain shortness of breath.  HPI: Sean Hinton is a 55 y.o. male with history of CAD, systolic CHF, polysubstance abuse, paroxysmal defibrillation and diabetes mellitus presents to the ER because of chest pain and shortness of breath. Patient's symptoms started last evening. Patient states he had shovelled snow following which he had some lunch and was taking restaurant suddenly started developing chest pain which was pressure-like retrosternal nonradiating. Patient also had shortness of breath. He took all his home medication despite which patient's chest pain and shortness of breath persisted. Has been having some cough which patient states has been chronic. Denies any fever chills nausea vomiting abdominal pain. Since his symptoms did not get better he came to the ER. Troponin the ER is mildly elevated. EKG shows nonspecific changes. Chest x-ray shows infiltrates concerning for pneumonia. BNP is mildly elevated. Patient was initially placed on BiPAP for 2 hours following which patient's breathing improved. Patient still has chest pressure. Urine drug screen is positive for cocaine. Patient is being admitted for further management. Patient was recently admitted last month for CHF or pneumonia at the time patient also was in A. fib with RVR.   Review of Systems: As presented in the history of presenting illness, rest negative.  Past Medical History  Diagnosis Date  . Diabetes mellitus with complication (HCC)   . Hypertension   . Syncope     a. 02/2015 - felt 2/2 rapid AF/AFL.  Marland Kitchen Atrial fibrillation and flutter (HCC)     a. Dx 02/2015 - placed on Xarelto; b. 09/2015 recurrence in setting of PNA.  . Ischemic cardiomyopathy     a. 09/2015 Echo: EF 35-40%.  Marland Kitchen CAD (coronary artery  disease)     a. Dx 02/2015 - 2V CAD with CTO LAD, diffuse dz in PDA, OM - med rx.  . Hypertensive heart disease   . Hyperlipidemia   . Mitral regurgitation     a. Mod by echo 02/2015,  . Tobacco abuse   . Chronic systolic CHF (congestive heart failure) (HCC)     a. 09/2015 Echo: EF 35-40%, sev apical/antlat, apical HK, mild MR, mildly dil LA.  Marland Kitchen ARF (acute respiratory failure) (HCC)   . Pneumonia 09/2015  . GERD (gastroesophageal reflux disease)   . Headache   . Arthritis     ra   Past Surgical History  Procedure Laterality Date  . Right thumb surgery     . Cardiac catheterization N/A 02/24/2015    Procedure: Left Heart Cath and Coronary Angiography;  Surgeon: Marykay Lex, MD;  Location: [facility] INVASIVE CV LAB CUPID;  Service: Cardiovascular;  Laterality: N/A;  . Cardiac catheterization  02/24/2015    Procedure: Coronary/Bypass Graft CTO Intervention;  Surgeon: Marykay Lex, MD;  Location: [facility] INVASIVE CV LAB CUPID;  Service: Cardiovascular;;   Social History:  reports that he has been smoking Cigarettes.  He has a 7.5 pack-year smoking history. He has never used smokeless tobacco. He reports that he uses illicit drugs (Cocaine). He reports that he does not drink alcohol. Where does patient live home. Can patient participate in ADLs? Yes.  No Known Allergies  Family History:  Family History  Problem Relation Age of Onset  . Diabetes Mother   . Hypertension Mother   . Heart disease Mother   . Cancer Mother  stomach cancer   . Diabetes Father   . Hypertension Father   . Heart disease Father   . Diabetes Sister   . Hypertension Sister   . Cancer Sister     breast cancer   . Diabetes Brother   . Hypertension Brother   . Heart disease Brother   . Stroke Brother   . Heart attack Mother   . Heart attack Father   . Heart attack Brother       Prior to Admission medications   Medication Sig Start Date End Date Taking? Authorizing Provider  albuterol (PROVENTIL  HFA;VENTOLIN HFA) 108 (90 BASE) MCG/ACT inhaler Inhale 2 puffs into the lungs every 6 (six) hours as needed for wheezing or shortness of breath. 10/17/15  Yes Ripudeep Jenna Luo, MD  atorvastatin (LIPITOR) 80 MG tablet Take 1 tablet (80 mg total) by mouth every evening. 10/17/15  Yes Ripudeep Jenna Luo, MD  Blood Glucose Monitoring Suppl (TRUE METRIX METER) DEVI 1 Device by Does not apply route 3 (three) times daily. 10/24/15  Yes Jaclyn Shaggy, MD  Dexlansoprazole (DEXILANT) 30 MG capsule Take 1 capsule (30 mg total) by mouth daily. 12/30/14  Yes Josalyn Funches, MD  digoxin (LANOXIN) 0.125 MG tablet Take 1 tablet (0.125 mg total) by mouth daily. 10/17/15  Yes Ripudeep Jenna Luo, MD  diltiazem (CARDIZEM CD) 240 MG 24 hr capsule Take 1 capsule (240 mg total) by mouth daily. 10/17/15  Yes Ripudeep Jenna Luo, MD  furosemide (LASIX) 40 MG tablet Take 1 tablet (40 mg total) by mouth daily. 02/25/15  Yes Dayna N Dunn, PA-C  glipiZIDE (GLUCOTROL) 5 MG tablet Take 1 tablet (5 mg total) by mouth 2 (two) times daily before a meal. 10/17/15  Yes Ripudeep K Rai, MD  glucose blood (TRUE METRIX BLOOD GLUCOSE TEST) test strip Use as instructed 10/24/15  Yes Jaclyn Shaggy, MD  hydrALAZINE (APRESOLINE) 50 MG tablet Take 1.5 tablets (75 mg total) by mouth 3 (three) times daily. 10/17/15  Yes Ripudeep K Rai, MD  ipratropium-albuterol (DUONEB) 0.5-2.5 (3) MG/3ML SOLN Take 3 mLs by nebulization 2 (two) times daily. 10/17/15  Yes Ripudeep Jenna Luo, MD  isosorbide mononitrate (IMDUR) 30 MG 24 hr tablet Take 1 tablet (30 mg total) by mouth daily. 10/17/15  Yes Ripudeep Jenna Luo, MD  nitroGLYCERIN (NITROSTAT) 0.4 MG SL tablet Place 1 tablet (0.4 mg total) under the tongue every 5 (five) minutes as needed for chest pain (up to 3 doses). 02/25/15  Yes Dayna N Dunn, PA-C  potassium chloride SA (K-DUR,KLOR-CON) 20 MEQ tablet Take 1 tablet (20 mEq total) by mouth daily. 10/17/15  Yes Ripudeep Jenna Luo, MD  rivaroxaban (XARELTO) 20 MG TABS tablet Take 1 tablet (20  mg total) by mouth daily with supper. 02/25/15  Yes Dayna N Dunn, PA-C  spironolactone (ALDACTONE) 25 MG tablet Take 1 tablet (25 mg total) by mouth daily. 02/25/15  Yes Dayna N Dunn, PA-C  TRUEPLUS LANCETS 28G MISC 1 each by Does not apply route 3 (three) times daily. 10/24/15  Yes Jaclyn Shaggy, MD    Physical Exam: Filed Vitals:   11/03/15 0300 11/03/15 0330 11/03/15 0545 11/03/15 0600  BP: 146/107 152/109 144/98 137/96  Pulse: 77 83    Temp:      TempSrc:      Resp: 21 33 24 34  Height:      Weight:      SpO2: 100% 100%       General:  Moderately but nourished.  Eyes:  Anicteric no pallor.  ENT: No discharge from ears eyes nose or mouth.  Neck: No mass felt. No JVD appreciated.  Cardiovascular: S1-S2 heard.  Respiratory: No rhonchi or crepitations.  Abdomen: Soft nontender bowel sounds present.  Skin: No rash.  Musculoskeletal: No edema.  Psychiatric: Appears normal.  Neurologic: Alert awake oriented to time place and person. Moves all extremities.  Labs on Admission:  Basic Metabolic Panel:  Recent Labs Lab 10/28/15 1138 11/03/15 0105  NA 143 138  K 3.8 4.4  CL 109 103  CO2 25 25  GLUCOSE 238* 255*  BUN 20 19  CREATININE 1.14 1.34*  CALCIUM 8.5* 8.9   Liver Function Tests: No results for input(s): AST, ALT, ALKPHOS, BILITOT, PROT, ALBUMIN in the last 168 hours. No results for input(s): LIPASE, AMYLASE in the last 168 hours. No results for input(s): AMMONIA in the last 168 hours. CBC:  Recent Labs Lab 11/03/15 0006  WBC 8.9  NEUTROABS 6.8  HGB 13.8  HCT 41.6  MCV 85.2  PLT 159   Cardiac Enzymes:  Recent Labs Lab 11/03/15 0105  TROPONINI 0.23*    BNP (last 3 results)  Recent Labs  10/14/15 0830 10/15/15 0350 11/03/15 0004  BNP 1219.9* 606.7* 571.9*    ProBNP (last 3 results) No results for input(s): PROBNP in the last 8760 hours.  CBG: No results for input(s): GLUCAP in the last 168 hours.  Radiological Exams on  Admission: Dg Chest 2 View  11/03/2015  CLINICAL DATA:  Central and RIGHT-sided chest pain or shortness of breath tonight. History of hypertension, diabetes, atrial fibrillation, pneumonia, renal failure, CHF. EXAM: CHEST  2 VIEW COMPARISON:  Chest radiograph October 15, 2015 FINDINGS: Cardiomediastinal silhouette is unremarkable. Diffuse interstitial prominence with patchy airspace opacities, predominant LEFT lung base. No pleural effusion. No pneumothorax. Soft tissue planes and included osseous structures are nonsuspicious. IMPRESSION: Interstitial prominence, with patchy airspace opacities concerning for bronchopneumonia. Followup PA and lateral chest X-ray is recommended in 3-4 weeks following trial of antibiotic therapy to ensure resolution and exclude underlying malignancy. Electronically Signed   By: Awilda Metro M.D.   On: 11/03/2015 00:35    EKG: Independently reviewed. Normal sinus rhythm with ST changes comparable to the old EKG.  Assessment/Plan Principal Problem:   Acute respiratory failure with hypoxia (HCC) Active Problems:   Cardiomyopathy, ischemic   Chest pain   PAF (paroxysmal atrial fibrillation) (HCC)   Type 2 diabetes mellitus with vascular disease (HCC)   1. Acute respiratory failure with hypoxia - differentials include CHF versus pneumonia. Since patient is having persistent chest pain CT angiogram of the chest has been ordered. At this time we will continue with empiric antibiotics and if patient's procalcitonin levels are negative or CT does not show any evidence of pneumonia may discontinue antibiotics. Patient has been placed on Nitropatch. Continue with Lasix and spironolactone. Note that patient's creatinine is mildly elevated from his last. Closely follow intake output daily weights and metabolic panel. 2. Chronic systolic heart failure last year measured in December 2016 was 35-40% - patient is on hydralazine and Imdur (not on Ace inhibitors). Continue with  spironolactone and Lasix and closely follow intake and output. And metabolic panel and daily weights. See #1. 3. Chest pain with history of CAD with elevated troponin in the setting of cocaine use - CT angiogram of the chest has been ordered since patient has persistent pain. Patient has been placed on Nitropatch. Cycle cardiac markers. Continue aspirin. 4. Paroxysmal atrial fibrillation presently in  sinus rhythm - patient is to be on xarelto but at this time since patient is having active chest pain we will change it to heparin per pharmacy. Continue Cardizem digoxin. Chads 2 vasc score was more than 2. 5. Diabetes mellitus type 2 - on glipizide and I have placed patient on sliding scale coverage. 6. Polysubstance abuse - tobacco cessation counseling requested. Patient is also possible cocaine social work consult requested. 7. Mildly elevated renal failure from baseline - closely follow metabolic panel.   DVT Prophylaxis heparin.  Code Status: Full code.  Family Communication: Discussed with patient.  Disposition Plan: Admit to inpatient.    Darenda Fike N. [facility] Pager [phone].  If 7PM-7AM, please contact night-coverage [email or web address] Password [facility] 11/03/2015, 6:13 AM

## 2015-11-03 NOTE — Interval H&P Note (Signed)
Cath Lab Visit (complete for each Cath Lab visit)  Clinical Evaluation Leading to the Procedure:   ACS: Yes.    Non-ACS:    Anginal Classification: CCS IV  Anti-ischemic medical therapy: Minimal Therapy (1 class of medications)  Non-Invasive Test Results: No non-invasive testing performed  Prior CABG: No previous CABG      History and Physical Interval Note:  11/03/2015 6:00 PM  Sean Hinton  has presented today for surgery, with the diagnosis of c/p  The various methods of treatment have been discussed with the patient and family. After consideration of risks, benefits and other options for treatment, the patient has consented to  Procedure(s): Left Heart Cath and Coronary Angiography (N/A) as a surgical intervention .  The patient's history has been reviewed, patient examined, no change in status, stable for surgery.  I have reviewed the patient's chart and labs.  Questions were answered to the patient's satisfaction.     Tonny Bollman

## 2015-11-04 ENCOUNTER — Encounter (HOSPITAL_COMMUNITY): Payer: Self-pay | Admitting: Cardiovascular Disease

## 2015-11-04 ENCOUNTER — Inpatient Hospital Stay (HOSPITAL_COMMUNITY): Payer: Self-pay

## 2015-11-04 DIAGNOSIS — I214 Non-ST elevation (NSTEMI) myocardial infarction: Principal | ICD-10-CM

## 2015-11-04 DIAGNOSIS — I10 Essential (primary) hypertension: Secondary | ICD-10-CM | POA: Insufficient documentation

## 2015-11-04 DIAGNOSIS — I5043 Acute on chronic combined systolic (congestive) and diastolic (congestive) heart failure: Secondary | ICD-10-CM | POA: Insufficient documentation

## 2015-11-04 DIAGNOSIS — E1159 Type 2 diabetes mellitus with other circulatory complications: Secondary | ICD-10-CM

## 2015-11-04 DIAGNOSIS — I509 Heart failure, unspecified: Secondary | ICD-10-CM

## 2015-11-04 LAB — GLUCOSE, CAPILLARY
GLUCOSE-CAPILLARY: 273 mg/dL — AB (ref 65–99)
Glucose-Capillary: 261 mg/dL — ABNORMAL HIGH (ref 65–99)
Glucose-Capillary: 161 mg/dL — ABNORMAL HIGH (ref 65–99)
Glucose-Capillary: 215 mg/dL — ABNORMAL HIGH (ref 65–99)
Glucose-Capillary: 241 mg/dL — ABNORMAL HIGH (ref 65–99)

## 2015-11-04 LAB — BASIC METABOLIC PANEL
ANION GAP: 9 (ref 5–15)
BUN: 17 mg/dL (ref 6–20)
CHLORIDE: 99 mmol/L — AB (ref 101–111)
CO2: 25 mmol/L (ref 22–32)
Calcium: 8.6 mg/dL — ABNORMAL LOW (ref 8.9–10.3)
Creatinine, Ser: 1.19 mg/dL (ref 0.61–1.24)
GFR calc Af Amer: >60 mL/min (ref >60)
GFR calc non Af Amer: >60 mL/min (ref >60)
GLUCOSE: 273 mg/dL — AB (ref 65–99)
POTASSIUM: 4 mmol/L (ref 3.5–5.1)
Sodium: 133 mmol/L — ABNORMAL LOW (ref 135–145)

## 2015-11-04 LAB — CBC
HCT: 39.8 % (ref 39.0–52.0)
HEMOGLOBIN: 13.4 g/dL (ref 13.0–17.0)
MCH: 28.8 pg (ref 26.0–34.0)
MCHC: 33.7 g/dL (ref 30.0–36.0)
MCV: 85.6 fL (ref 78.0–100.0)
Platelets: 170 10*3/uL (ref 150–400)
RBC: 4.65 MIL/uL (ref 4.22–5.81)
RDW: 14.8 % (ref 11.5–15.5)
WBC: 10 10*3/uL (ref 4.0–10.5)

## 2015-11-04 MED ORDER — HYDRALAZINE HCL 50 MG PO TABS
100.0000 mg | ORAL_TABLET | Freq: Three times a day (TID) | ORAL | Status: DC
Start: 2015-11-04 — End: 2015-11-05
  Administered 2015-11-04 (×2): 100 mg via ORAL
  Filled 2015-11-04 (×2): qty 2

## 2015-11-04 MED ORDER — LABETALOL HCL 200 MG PO TABS
200.0000 mg | ORAL_TABLET | Freq: Three times a day (TID) | ORAL | Status: DC
  Administered 2015-11-04 (×3): 200 mg via ORAL
  Filled 2015-11-04 (×3): qty 1

## 2015-11-04 MED ORDER — LISINOPRIL 10 MG PO TABS
10.0000 mg | ORAL_TABLET | Freq: Every day | ORAL | Status: DC
  Administered 2015-11-04: 10 mg via ORAL
  Filled 2015-11-04: qty 1

## 2015-11-04 MED ORDER — ISOSORBIDE DINITRATE 20 MG PO TABS
20.0000 mg | ORAL_TABLET | Freq: Three times a day (TID) | ORAL | Status: DC
  Administered 2015-11-04 – 2015-11-06 (×7): 20 mg via ORAL
  Filled 2015-11-04 (×2): qty 1
  Filled 2015-11-04: qty 2
  Filled 2015-11-04 (×4): qty 1
  Filled 2015-11-04: qty 2
  Filled 2015-11-04: qty 1

## 2015-11-04 NOTE — Progress Notes (Signed)
TR band removed at 0115. Pressure dressing applied. Site is level 0. Pt instructed to still treat R arm as "broken." Will continue to monitor.

## 2015-11-04 NOTE — Progress Notes (Signed)
Inpatient Diabetes Program Recommendations  AACE/ADA: New Consensus Statement on Inpatient Glycemic Control (2015)  Target Ranges:  Prepandial:   less than 140 mg/dL      Peak postprandial:   less than 180 mg/dL (1-2 hours)      Critically ill patients:  140 - 180 mg/dL   Review of Glycemic Control Results for Sean Hinton, Sean Hinton (MRN [id number]) as of 11/04/2015 11:11  Ref. Range 11/03/2015 12:57 11/03/2015 22:31 11/04/2015 08:45  Glucose-Capillary Latest Ref Range: 65-99 mg/dL 638 (H) 453 (H) 646 (H)   Inpatient Diabetes Program Recommendations:  Insulin - Basal: add Lantus 15 units  Thank you  Piedad Climes BSN, RN,CDE Inpatient Diabetes Coordinator [phone] (team pager)

## 2015-11-04 NOTE — Progress Notes (Signed)
Patient Name: Sean Hinton Date of Encounter: 11/04/2015  Principal Problem:   NSTEMI (non-ST elevated myocardial infarction) [facility]) Active Problems:   Cardiomyopathy, ischemic   Chest pain   Acute respiratory failure with hypoxia (HCC)   PAF (paroxysmal atrial fibrillation) (HCC)   Type 2 diabetes mellitus with vascular disease (HCC)   Length of Stay: 1  SUBJECTIVE  Marked clinical improvement. Angina and dyspnea have resolved. No problems walking to the bathroom. Still on moderate-high dose of NTG IV. Troponin>65  CURRENT MEDS . aspirin  81 mg Oral Daily  . atorvastatin  80 mg Oral QPM  . clopidogrel  75 mg Oral Q breakfast  . digoxin  0.125 mg Oral Daily  . furosemide  40 mg Oral Daily  . glipiZIDE  5 mg Oral BID AC  . hydrALAZINE  75 mg Oral TID  . insulin aspart  0-9 Units Subcutaneous TID WC  . ipratropium-albuterol  3 mL Nebulization BID  . isosorbide dinitrate  20 mg Oral TID  . labetalol  200 mg Oral TID  . pantoprazole  40 mg Oral Daily  . potassium chloride SA  20 mEq Oral Daily  . rivaroxaban  20 mg Oral Daily  . sodium chloride  3 mL Intravenous Q12H  . sodium chloride  3 mL Intravenous Q12H  . sodium chloride  3 mL Intravenous Q12H  . spironolactone  25 mg Oral Daily    OBJECTIVE   Intake/Output Summary (Last 24 hours) at 11/04/15 0959 Last data filed at 11/04/15 0700  Gross per 24 hour  Intake 895.46 ml  Output   2075 ml  Net -1179.54 ml   Filed Weights   11/02/15 2336 11/04/15 0207  Weight: 170 lb (77.111 kg) 159 lb 6.3 oz (72.3 kg)    PHYSICAL EXAM Filed Vitals:   11/04/15 0500 11/04/15 0600 11/04/15 0700 11/04/15 0846  BP: 121/78 126/85 127/78 143/104  Pulse: 87 84 88 93  Temp:    100.1 F (37.8 C)  TempSrc:    Oral  Resp: 29 24 23 25   Height:      Weight:      SpO2: 100% 100% 97% 100%   General: Alert, oriented x3, no distress Head: no evidence of trauma, PERRL, EOMI, no exophtalmos or lid lag, no myxedema, no xanthelasma;  normal ears, nose and oropharynx Neck: normal jugular venous pulsations and no hepatojugular reflux; brisk carotid pulses without delay and no carotid bruits Chest: clear to auscultation, no signs of consolidation by percussion or palpation, normal fremitus, symmetrical and full respiratory excursions Cardiovascular: normal position and quality of the apical impulse, regular rhythm, normal first and second heart sounds, no rub, +ve S4 gallop, no murmur Abdomen: no tenderness or distention, no masses by palpation, no abnormal pulsatility or arterial bruits, normal bowel sounds, no hepatosplenomegaly Extremities: no clubbing, cyanosis or edema; 2+ radial, ulnar and brachial pulses bilaterally; 2+ right femoral, posterior tibial and dorsalis pedis pulses; 2+ left femoral, posterior tibial and dorsalis pedis pulses; no subclavian or femoral bruits Neurological: grossly nonfocal  LABS  CBC  Recent Labs  11/03/15 0006 11/03/15 1109 11/04/15 0345  WBC 8.9 10.5 10.0  NEUTROABS 6.8 8.3*  --   HGB 13.8 13.5 13.4  HCT 41.6 39.9 39.8  MCV 85.2 84.9 85.6  PLT 159 208 170   Basic Metabolic Panel  Recent Labs  11/03/15 1109 11/04/15 0345  NA 136 133*  K 3.8 4.0  CL 103 99*  CO2 25 25  GLUCOSE 261* 273*  BUN 16 17  CREATININE 1.11 1.19  CALCIUM 9.0 8.6*   Liver Function Tests  Recent Labs  11/03/15 1109  AST 101*  ALT 42  ALKPHOS 102  BILITOT 0.9  PROT 6.4*  ALBUMIN 3.0*   No results for input(s): LIPASE, AMYLASE in the last 72 hours. Cardiac Enzymes  Recent Labs  11/03/15 0105 11/03/15 1109 11/03/15 2232  TROPONINI 0.23* 15.62* >65.00*    Recent Labs  11/03/15 1109  TSH 0.447    Radiology Studies Imaging results have been reviewed and Dg Chest 2 View  11/03/2015  CLINICAL DATA:  Central and RIGHT-sided chest pain or shortness of breath tonight. History of hypertension, diabetes, atrial fibrillation, pneumonia, renal failure, CHF. EXAM: CHEST  2 VIEW COMPARISON:   Chest radiograph October 15, 2015 FINDINGS: Cardiomediastinal silhouette is unremarkable. Diffuse interstitial prominence with patchy airspace opacities, predominant LEFT lung base. No pleural effusion. No pneumothorax. Soft tissue planes and included osseous structures are nonsuspicious. IMPRESSION: Interstitial prominence, with patchy airspace opacities concerning for bronchopneumonia. Followup PA and lateral chest X-ray is recommended in 3-4 weeks following trial of antibiotic therapy to ensure resolution and exclude underlying malignancy. Electronically Signed   By: Awilda Metro M.D.   On: 11/03/2015 00:35   Ct Angio Chest Aorta W/cm &/or Wo/cm  11/03/2015  CLINICAL DATA:  55 year old male with central chest pain. EXAM: CT ANGIOGRAPHY CHEST WITH CONTRAST TECHNIQUE: Multidetector CT imaging of the chest was performed using the standard protocol during bolus administration of intravenous contrast. Multiplanar CT image reconstructions and MIPs were obtained to evaluate the vascular anatomy. CONTRAST:  OMNIPAQUE IOHEXOL 350 MG/ML SOLN COMPARISON:  Radiograph dated 11/02/2015 FINDINGS: There are small bilateral pleural effusions. There is diffuse interstitial prominence with patchy areas of ground-glass airspace opacity predominantly involving the lower lobes compatible with pneumonia. Clinical correlation and follow-up resolution is recommended. There is no pneumothorax. The central airways are patent. The thoracic aorta appears unremarkable. Evaluation of the pulmonary arteries is limited due to suboptimal opacification of the peripheral branches. No central pulmonary artery embolus identified. There is no cardiomegaly or pericardial effusion. Top-normal bilateral hilar lymph nodes. There is coronary vascular calcification. The visualized esophagus and thyroid gland appear grossly unremarkable. There is no axillary adenopathy. The chest wall soft tissues appear unremarkable. Mild degenerative changes  of the spine. No acute fracture. The visualized upper abdomen is unremarkable. Review of the MIP images confirms the above findings. IMPRESSION: No CT evidence of pulmonary embolism or aortic dissection. Small bilateral pleural effusions and diffuse bilateral ground-glass airspace opacities most compatible with pneumonia. Clinical correlation and follow-up recommended. Electronically Signed   By: Elgie Collard M.D.   On: 11/03/2015 06:49    TELE NSR  ECG NSR, LVH, QS V1-V3, improved lateral T wave changes  ASSESSMENT AND PLAN   1. Acute lateral NSTEMI due to thrombotic ramus intermedius occlusion s/p DES (and chronic LAD occlusion with anteroapical scar) - probably substantial myocardial injury, but also some degree of stunning - ASA+Plavix+Xarelto for 30 days, stop ASA after 30 days. Plavix + Xarelto minimum of 12 months, preferably lifelong - echo today - ideally will place back on true beta blocker, preferably carvedilol - normal renal function, restart ACEi  2. Acute on chronic combined systolic and diastolic HF - improved, probably close to euvolemia - on daily dose loop diuretic and spironolactone - plan long term carvedilol and ACEi, maybe Entresto if affordable - diltiazem contraindicated  3. History of atrial fibrillation and atrial flutter with 1:1 conduction (  leading to syncope) - avoid diltiazem and verapamil due to low EF, prefer beta blocker - on digoxin and Xarelto  4. Cocaine abuse - told him in clear stark terms that this is a lethal habit and that it will have a severe adverse interaction with the medications he needs for CAD and CHF, specifically his beta blocker. Combined alpha+beta blockade with carvedilol is preferable to metoprolol. - he seems sincere when he says he will never use cocaine again; I don't think he is an addict, but clearly makes poor choices.  5. Severe HTN - currently on multiple meds, ideally will gradually simplify to max dose ACEi/ARB or  Entresto, spironolactone and carvedilol. Target BP is 120s/70s. - wean off IV NTG today.  6. Bronchopneumonia - doubtful diagnosis, was probably all CHF. Recheck CXR?  7. Tobacco abuse/probable COPD - focus on cocaine abuse first, important to quit cigarettes also.  8. Possible OSA - needs sleep study  9. DM type 2 on OAD, poorly controlled - A1c was 9.6% in December  Hopefully today will wean off IV NTG and transfer out of ICU.  [name], MD, [facility] [phone] office [phone] pager 11/04/2015 9:59 AM

## 2015-11-04 NOTE — Progress Notes (Signed)
Echocardiogram 2D Echocardiogram has been performed.  Sean Hinton 11/04/2015, 1:26 PM

## 2015-11-04 NOTE — Progress Notes (Signed)
Pt declined ambulation, sts he feels nauseated and tired. Has had episodic CP today, he feels is related to food. Began discussing MI, stent, smoking cessation and diet. Pt receptive. Left materials for him to read. Will f/u tomorrow. [id number] Ethelda Chick CES, ACSM 3:06 PM 11/04/2015

## 2015-11-04 NOTE — Progress Notes (Signed)
[facility] PROGRESS NOTE  Sean Hinton XTG:[id number] DOB: 09/21/1961 DOA: 11/02/2015 PCP: Minerva Ends, MD  Assessment/Plan: 1. Non-ST segment elevation myocardial infarction -[name] having a history of coronary artery disease, having his last cardiac catheterization performed on 02/24/2015 that revealed severe multivessel disease; mid LAD 100% stenosed, first obtuse marginal branch 80% stenosed, right posterior descending artery-2 70% stenosed, right posterior descending artery-1 50% stenosed -He presents with chest pain having typical and atypical features. -Having significant troponin elevation from 0.23 to 15.62 -Cardiology consulted, he was started on IV heparin and IV nitroglycerin. He will likely be taken to the Cath lab this afternoon. Await further recs from cards.  -Aspirin 325 mg by mouth daily, Lipitor 80 mg by mouth daily, labetalol 100 mg by mouth 3 times a day -S/P cardiac catheterization on 11/03/2015, found to have severe 3 vessel disease with PCI to Ramus lesion with drug eluding stent. Now on dual agent antiplatelet therapy with ASA and Plavix.   2.  Question pneumonia. -Patient was treated for community-acquired pneumonia during hospitalization for 10/13/2015 through 10/17/2015, discharged on Ceftin.  -Initial chest x-ray showing patchy airspace opacities that could possibly reflect bronchopneumonia. He was initially started on Vancomycin and Cefepime.  -I suspect symptoms likely related to CHF and NSTEMI. He remains afebrile, WBC's within normal range, nontoxic appearing. Will observe off of antimicrobial therapy. Stopping Vanc and Cefepime.   3.  Acute on chronic systolic congestive heart failure -Patient having history of ischemic cardiomyopathy, last transthoracic echocardiogram was performed with 10/14/2015 that showed EF of 35-40% with severe hypokinesis in the apical lateral septal and apical myocardium -Presented with complaints of shortness of  breath. -I suspect NSTEMI precipitating acute CHF -He was given 40 mg of IV Lasix in the emergency department.  4.  Acute hypoxemic respiratory failure -Evidence by patient presented in respiratory distress, having a respiratory rate of 40, placed on noninvasive positive pressure ventilation -Likely secondary to acute on chronic systolic congestive heart failure in setting of non-ST segment elevation myocardial infarction -Respiratory status improved with initiation of Lasix -Chest x-ray showed a possible pneumonia however, I think symptoms likely secondary to CHF/NSTEMI. Will monitor him off of antimicrobial therapy.   5.  History of polysubstance abuse -Urine drug screen was positive for cocaine. Looking back he has had previous positive urine drug screen for cocaine (on 10/14/2015) -Patient counseled.   6.  Hypertension -He was started on NGT gtt by cardiology.  -Blood pressures improved, continue Labetalol 100 mg TID  7. Diabetes Mellitus -Blood sugars controlled continue glipizide 5 mg BID   Code Status: Full Code Family Communication: I spoke to his wife who was present at bedside. Disposition Plan: Monitor in SDU   Consultants:  Cardiology  Procedures  Cardiac catheterization Impression: 1. Severe 3 vessel CAD with total occlusion of the LAD (chronic), total thrombotic occlusion of the ramus intermedius (acute), and severe diffuse distal vessel disease in the RCA.  2. Severely elevated LVEDP with acute pulmonary edema, improved during the course of the procedure with IV furosemide and NTG and coronary reperfusion 3. A drug-eluting stent was placed. Post-stent angioplasty was performed. Maximum pressure: 14 atm. The post-interventional distal flow is normal (TIMI 3). The intervention was successful. No complications occurred at this lesion. There is total occlusion of a large intermediate branch with associated thrombus. Angiomax is used for anticoagulation. The patient is  given plavix 600 mg x 1. An XB-LAD guide is used and a [employer]er, retail is advanced beyond the lesion.  The lesion is dilated with a 2.5 mm balloon and TIMI-3 flow is restored  HPI/Subjective: Mr Sean Hinton is a 55 year old gentleman with a past medical history of ischemic cardiomyopathy, paroxysmal atrial fibrillation, chronically anticoagulate with Xarelto, coronary artery disease, presented to the emergency department on 11/01/2014 with complaints of chest pain that was associate with shortness of breath along with nausea and vomiting. Symptoms started the day prior while shoveling snow. Chest x-ray performed in the emergency department showed patchy airspace opacities that appear consistent with pneumonia. Initial troponin mildly elevated at 0.23 however markedly increased to 15.62 on repeat troponin. He was started on IV heparin and seen by cardiology. Given presence of ongoing chest pain he was started on IV nitroglycerin.  Objective: Filed Vitals:   11/04/15 0600 11/04/15 0700  BP: 126/85 127/78  Pulse: 84 88  Temp:    Resp: 24 23    Intake/Output Summary (Last 24 hours) at 11/04/15 0731 Last data filed at 11/04/15 0700  Gross per 24 hour  Intake 895.46 ml  Output   2075 ml  Net -1179.54 ml   Filed Weights   11/02/15 2336 11/04/15 0207  Weight: 77.111 kg (170 lb) 72.3 kg (159 lb 6.3 oz)    Exam:   General:  He looks better, presently denies CP or SOB  Cardiovascular: Tachycardic, regular rate and rhythm, 2/6 SEM  Respiratory: Has bilateral crackles and rhonchi. Appears dyspneic at rest.   Abdomen: Soft, nontender nondistended  Musculoskeletal: He has pain with palpation over anterior chest wall.   Data Reviewed: Basic Metabolic Panel:  Recent Labs Lab 10/28/15 1138 11/03/15 0105 11/03/15 1109 11/04/15 0345  NA 143 138 136 133*  K 3.8 4.4 3.8 4.0  CL 109 103 103 99*  CO2 25 25 25 25   GLUCOSE 238* 255* 261* 273*  BUN 20 19 16 17   CREATININE 1.14 1.34* 1.11 1.19   CALCIUM 8.5* 8.9 9.0 8.6*   Liver Function Tests:  Recent Labs Lab 11/03/15 1109  AST 101*  ALT 42  ALKPHOS 102  BILITOT 0.9  PROT 6.4*  ALBUMIN 3.0*   No results for input(s): LIPASE, AMYLASE in the last 168 hours. No results for input(s): AMMONIA in the last 168 hours. CBC:  Recent Labs Lab 11/03/15 0006 11/03/15 1109 11/04/15 0345  WBC 8.9 10.5 10.0  NEUTROABS 6.8 8.3*  --   HGB 13.8 13.5 13.4  HCT 41.6 39.9 39.8  MCV 85.2 84.9 85.6  PLT 159 208 170   Cardiac Enzymes:  Recent Labs Lab 11/03/15 0105 11/03/15 1109 11/03/15 2232  TROPONINI 0.23* 15.62* >65.00*   BNP (last 3 results)  Recent Labs  10/14/15 0830 10/15/15 0350 11/03/15 0004  BNP 1219.9* 606.7* 571.9*    ProBNP (last 3 results) No results for input(s): PROBNP in the last 8760 hours.  CBG:  Recent Labs Lab 11/03/15 1257 11/03/15 2231  GLUCAP 199* 273*    Recent Results (from the past 240 hour(s))  MRSA PCR Screening     Status: None   Collection Time: 11/03/15  5:14 PM  Result Value Ref Range Status   MRSA by PCR NEGATIVE NEGATIVE Final    Comment:        The GeneXpert MRSA Assay (FDA approved for NASAL specimens only), is one component of a comprehensive MRSA colonization surveillance program. It is not intended to diagnose MRSA infection nor to guide or monitor treatment for MRSA infections.      Studies: Dg Chest 2 View  11/03/2015  CLINICAL DATA:  Central and RIGHT-sided chest pain or shortness of breath tonight. History of hypertension, diabetes, atrial fibrillation, pneumonia, renal failure, CHF. EXAM: CHEST  2 VIEW COMPARISON:  Chest radiograph October 15, 2015 FINDINGS: Cardiomediastinal silhouette is unremarkable. Diffuse interstitial prominence with patchy airspace opacities, predominant LEFT lung base. No pleural effusion. No pneumothorax. Soft tissue planes and included osseous structures are nonsuspicious. IMPRESSION: Interstitial prominence, with patchy  airspace opacities concerning for bronchopneumonia. Followup PA and lateral chest X-ray is recommended in 3-4 weeks following trial of antibiotic therapy to ensure resolution and exclude underlying malignancy. Electronically Signed   By: Elon Alas M.D.   On: 11/03/2015 00:35   Ct Angio Chest Aorta W/cm &/or Wo/cm  11/03/2015  CLINICAL DATA:  55 year old male with central chest pain. EXAM: CT ANGIOGRAPHY CHEST WITH CONTRAST TECHNIQUE: Multidetector CT imaging of the chest was performed using the standard protocol during bolus administration of intravenous contrast. Multiplanar CT image reconstructions and MIPs were obtained to evaluate the vascular anatomy. CONTRAST:  116m OMNIPAQUE IOHEXOL 350 MG/ML SOLN COMPARISON:  Radiograph dated 11/02/2015 FINDINGS: There are small bilateral pleural effusions. There is diffuse interstitial prominence with patchy areas of ground-glass airspace opacity predominantly involving the lower lobes compatible with pneumonia. Clinical correlation and follow-up resolution is recommended. There is no pneumothorax. The central airways are patent. The thoracic aorta appears unremarkable. Evaluation of the pulmonary arteries is limited due to suboptimal opacification of the peripheral branches. No central pulmonary artery embolus identified. There is no cardiomegaly or pericardial effusion. Top-normal bilateral hilar lymph nodes. There is coronary vascular calcification. The visualized esophagus and thyroid gland appear grossly unremarkable. There is no axillary adenopathy. The chest wall soft tissues appear unremarkable. Mild degenerative changes of the spine. No acute fracture. The visualized upper abdomen is unremarkable. Review of the MIP images confirms the above findings. IMPRESSION: No CT evidence of pulmonary embolism or aortic dissection. Small bilateral pleural effusions and diffuse bilateral ground-glass airspace opacities most compatible with pneumonia. Clinical  correlation and follow-up recommended. Electronically Signed   By: AAnner CreteM.D.   On: 11/03/2015 06:49    Scheduled Meds: . aspirin  81 mg Oral Daily  . atorvastatin  80 mg Oral QPM  . ceFEPime (MAXIPIME) IV  2 g Intravenous Q12H  . clopidogrel  75 mg Oral Q breakfast  . digoxin  0.125 mg Oral Daily  . furosemide  40 mg Oral Daily  . glipiZIDE  5 mg Oral BID AC  . hydrALAZINE  75 mg Oral TID  . insulin aspart  0-9 Units Subcutaneous TID WC  . ipratropium-albuterol  3 mL Nebulization BID  . labetalol  100 mg Oral TID  . pantoprazole  40 mg Oral Daily  . potassium chloride SA  20 mEq Oral Daily  . rivaroxaban  20 mg Oral Daily  . sodium chloride  3 mL Intravenous Q12H  . sodium chloride  3 mL Intravenous Q12H  . sodium chloride  3 mL Intravenous Q12H  . spironolactone  25 mg Oral Daily  . vancomycin  1,000 mg Intravenous Q12H   Continuous Infusions: . nitroGLYCERIN 40 mcg/min (11/04/15 04259    Principal Problem:   NSTEMI (non-ST elevated myocardial infarction) ([facility] Active Problems:   Cardiomyopathy, ischemic   Chest pain   Acute respiratory failure with hypoxia (HCC)   PAF (parox[place]mal [place] fibrillation) ([place]   Type 2 diabetes mellitus with vascular disease ([facility]    Time spent: 35 min    ZKelvin Cellar [facility] Pager [phone] If  7PM-7AM, please contact night-coverage at [email or web address], password [facility] 11/04/2015, 7:31 AM  LOS: 1 day

## 2015-11-04 NOTE — Progress Notes (Signed)
Pt trf to floor via wc. States he has abdominal pain and asks for morphine - not due for another hour. Pt accepting. No other distress. See flowsheets for assessments. Pt oriented to room and call bell. Tech and this RN called to get pt on tele monitoring.

## 2015-11-04 NOTE — Progress Notes (Signed)
CRITICAL VALUE ALERT  Critical value received:  Troponin >65  Date of notification:  11/04/15  Time of notification:  11/04/15  Critical value read back:No. - no call received  Nurse who received alert:  Ann Maki  MD notified (1st page):  Benay Pillow  Time of first page:  0154  Responding MD:  Benay Pillow  Time MD responded:  [phone]

## 2015-11-04 NOTE — Hospital Discharge Follow-Up (Signed)
This patient has been followed at the [facility] at the [facility].  He has an appointment scheduled for 11/11/15@1000  with Dr Venetia Night.  Will continue to follow his hospitalization.

## 2015-11-04 NOTE — Care Management Note (Signed)
Case Management Note  Patient Details  Name: Sean Hinton MRN: [id number] Date of Birth: 08/04/1961  Subjective/Objective:  Adm w mi                 Action/Plan: lives w fam, pcp dr Baldomero Lamy   Expected Discharge Date:                  Expected Discharge Plan:     In-House Referral:     Discharge planning Services     Post Acute Care Choice:    Choice offered to:     DME Arranged:    DME Agency:     HH Arranged:    HH Agency:     Status of Service:     Medicare Important Message Given:    Date Medicare IM Given:    Medicare IM give by:    Date Additional Medicare IM Given:    Additional Medicare Important Message give by:     If discussed at Long Length of Stay Meetings, dates discussed:    Additional Comments: ur review done  Hanley Hays, RN 11/04/2015, 7:51 AM

## 2015-11-05 ENCOUNTER — Encounter (HOSPITAL_COMMUNITY): Payer: Self-pay | Admitting: General Practice

## 2015-11-05 DIAGNOSIS — R0600 Dyspnea, unspecified: Secondary | ICD-10-CM

## 2015-11-05 DIAGNOSIS — F1410 Cocaine abuse, uncomplicated: Secondary | ICD-10-CM

## 2015-11-05 DIAGNOSIS — Z720 Tobacco use: Secondary | ICD-10-CM

## 2015-11-05 DIAGNOSIS — K219 Gastro-esophageal reflux disease without esophagitis: Secondary | ICD-10-CM

## 2015-11-05 LAB — BASIC METABOLIC PANEL
Anion gap: 11 (ref 5–15)
BUN: 17 mg/dL (ref 6–20)
CO2: 25 mmol/L (ref 22–32)
Calcium: 8.7 mg/dL — ABNORMAL LOW (ref 8.9–10.3)
Chloride: 100 mmol/L — ABNORMAL LOW (ref 101–111)
Creatinine, Ser: 1.08 mg/dL (ref 0.61–1.24)
GFR calc Af Amer: >60 mL/min (ref >60)
GFR calc non Af Amer: >60 mL/min (ref >60)
Glucose, Bld: 179 mg/dL — ABNORMAL HIGH (ref 65–99)
Potassium: 3.9 mmol/L (ref 3.5–5.1)
Sodium: 136 mmol/L (ref 135–145)

## 2015-11-05 LAB — GLUCOSE, CAPILLARY
GLUCOSE-CAPILLARY: 217 mg/dL — AB (ref 65–99)
GLUCOSE-CAPILLARY: 304 mg/dL — AB (ref 65–99)
Glucose-Capillary: 125 mg/dL — ABNORMAL HIGH (ref 65–99)
Glucose-Capillary: 228 mg/dL — ABNORMAL HIGH (ref 65–99)

## 2015-11-05 LAB — PROCALCITONIN: Procalcitonin: 0.12 ng/mL

## 2015-11-05 LAB — CBC
HCT: 41 % (ref 39.0–52.0)
Hemoglobin: 13.8 g/dL (ref 13.0–17.0)
MCH: 28.6 pg (ref 26.0–34.0)
MCHC: 33.7 g/dL (ref 30.0–36.0)
MCV: 84.9 fL (ref 78.0–100.0)
Platelets: 163 10*3/uL (ref 150–400)
RBC: 4.83 MIL/uL (ref 4.22–5.81)
RDW: 14.6 % (ref 11.5–15.5)
WBC: 10.1 10*3/uL (ref 4.0–10.5)

## 2015-11-05 MED ORDER — HYDRALAZINE HCL 50 MG PO TABS
50.0000 mg | ORAL_TABLET | Freq: Three times a day (TID) | ORAL | Status: DC
  Administered 2015-11-05 – 2015-11-06 (×4): 50 mg via ORAL
  Filled 2015-11-05 (×4): qty 1

## 2015-11-05 MED ORDER — CARVEDILOL 12.5 MG PO TABS
12.5000 mg | ORAL_TABLET | Freq: Two times a day (BID) | ORAL | Status: DC
  Administered 2015-11-05 – 2015-11-06 (×3): 12.5 mg via ORAL
  Filled 2015-11-05: qty 4
  Filled 2015-11-05 (×2): qty 1

## 2015-11-05 MED ORDER — CALCIUM CARBONATE ANTACID 500 MG PO CHEW
1.0000 | CHEWABLE_TABLET | Freq: Three times a day (TID) | ORAL | Status: DC
  Administered 2015-11-05 – 2015-11-06 (×2): 200 mg via ORAL
  Filled 2015-11-05 (×2): qty 1

## 2015-11-05 MED ORDER — FAMOTIDINE 20 MG PO TABS
20.0000 mg | ORAL_TABLET | Freq: Two times a day (BID) | ORAL | Status: DC
  Administered 2015-11-05 – 2015-11-06 (×3): 20 mg via ORAL
  Filled 2015-11-05 (×3): qty 1

## 2015-11-05 MED ORDER — GLIPIZIDE 10 MG PO TABS
10.0000 mg | ORAL_TABLET | Freq: Two times a day (BID) | ORAL | Status: DC
  Administered 2015-11-05 – 2015-11-06 (×2): 10 mg via ORAL
  Filled 2015-11-05 (×2): qty 1

## 2015-11-05 MED ORDER — TRAMADOL HCL 50 MG PO TABS
50.0000 mg | ORAL_TABLET | Freq: Four times a day (QID) | ORAL | Status: DC | PRN
  Administered 2015-11-05 – 2015-11-06 (×2): 50 mg via ORAL
  Filled 2015-11-05 (×2): qty 1

## 2015-11-05 MED ORDER — GI COCKTAIL ~~LOC~~
30.0000 mL | Freq: Three times a day (TID) | ORAL | Status: DC | PRN
  Administered 2015-11-05 – 2015-11-06 (×2): 30 mL via ORAL
  Filled 2015-11-05 (×2): qty 30

## 2015-11-05 MED ORDER — LISINOPRIL 10 MG PO TABS
20.0000 mg | ORAL_TABLET | Freq: Every day | ORAL | Status: DC
  Administered 2015-11-05 – 2015-11-06 (×2): 20 mg via ORAL
  Filled 2015-11-05 (×2): qty 2

## 2015-11-05 NOTE — Progress Notes (Signed)
Patient Name: Sean Hinton Date of Encounter: 11/05/2015  Principal Problem:   NSTEMI (non-ST elevated myocardial infarction) [facility]) Active Problems:   Cardiomyopathy, ischemic   Chest pain   Acute respiratory failure with hypoxia (HCC)   PAF (paroxysmal atrial fibrillation) (HCC)   Type 2 diabetes mellitus with vascular disease (HCC)   Acute on chronic combined systolic and diastolic congestive heart failure (HCC)   Severe essential hypertension   Length of Stay: 2  SUBJECTIVE  No angina, no dyspnea. Has epigastric discomfort every time he tries to eat or drink, different from heart attack pain. Takes Dexilant at home, Protonix has not worked in the past.  No bleeding. ECHO shows EF down to 30-35% with new inferolateral wall motion abnormality.  CURRENT MEDS . aspirin  81 mg Oral Daily  . atorvastatin  80 mg Oral QPM  . clopidogrel  75 mg Oral Q breakfast  . digoxin  0.125 mg Oral Daily  . furosemide  40 mg Oral Daily  . glipiZIDE  5 mg Oral BID AC  . hydrALAZINE  100 mg Oral TID  . insulin aspart  0-9 Units Subcutaneous TID WC  . ipratropium-albuterol  3 mL Nebulization BID  . isosorbide dinitrate  20 mg Oral TID  . labetalol  200 mg Oral TID  . lisinopril  10 mg Oral Daily  . pantoprazole  40 mg Oral Daily  . potassium chloride SA  20 mEq Oral Daily  . rivaroxaban  20 mg Oral Daily  . sodium chloride  3 mL Intravenous Q12H  . sodium chloride  3 mL Intravenous Q12H  . sodium chloride  3 mL Intravenous Q12H  . spironolactone  25 mg Oral Daily    OBJECTIVE   Intake/Output Summary (Last 24 hours) at 11/05/15 0841 Last data filed at 11/04/15 2300  Gross per 24 hour  Intake 395.75 ml  Output   1101 ml  Net -705.25 ml   Filed Weights   11/02/15 2336 11/04/15 0207 11/05/15 0510  Weight: 170 lb (77.111 kg) 159 lb 6.3 oz (72.3 kg) 154 lb 15.7 oz (70.3 kg)    PHYSICAL EXAM Filed Vitals:   11/04/15 1500 11/04/15 1616 11/04/15 2116 11/05/15 0510  BP: 126/91  100/54 108/65 138/88  Pulse: 88 89 91 96  Temp:  99.4 F (37.4 C) 99 F (37.2 C) 99.1 F (37.3 C)  TempSrc:  Oral Oral Oral  Resp: 17 18 18 18   Height:      Weight:    154 lb 15.7 oz (70.3 kg)  SpO2: 98% 98% 97% 100%   General: Alert, oriented x3, no distress Head: no evidence of trauma, PERRL, EOMI, no exophtalmos or lid lag, no myxedema, no xanthelasma; normal ears, nose and oropharynx Neck: normal jugular venous pulsations and no hepatojugular reflux; brisk carotid pulses without delay and no carotid bruits Chest: clear to auscultation, no signs of consolidation by percussion or palpation, normal fremitus, symmetrical and full respiratory excursions Cardiovascular: normal position and quality of the apical impulse, regular rhythm, normal first and second heart sounds, no rubs +ve S4 gallop, no murmur Abdomen: no tenderness or distention, no masses by palpation, no abnormal pulsatility or arterial bruits, normal bowel sounds, no hepatosplenomegaly Extremities: no clubbing, cyanosis or edema; 2+ radial, ulnar and brachial pulses bilaterally; 2+ right femoral, posterior tibial and dorsalis pedis pulses; 2+ left femoral, posterior tibial and dorsalis pedis pulses; no subclavian or femoral bruits Neurological: grossly nonfocal  LABS  CBC  Recent Labs  11/03/15 0006 11/03/15  1109 11/04/15 0345 11/05/15 0435  WBC 8.9 10.5 10.0 10.1  NEUTROABS 6.8 8.3*  --   --   HGB 13.8 13.5 13.4 13.8  HCT 41.6 39.9 39.8 41.0  MCV 85.2 84.9 85.6 84.9  PLT 159 208 170 163   Basic Metabolic Panel  Recent Labs  11/04/15 0345 11/05/15 0435  NA 133* 136  K 4.0 3.9  CL 99* 100*  CO2 25 25  GLUCOSE 273* 179*  BUN 17 17  CREATININE 1.19 1.08  CALCIUM 8.6* 8.7*   Liver Function Tests  Recent Labs  11/03/15 1109  AST 101*  ALT 42  ALKPHOS 102  BILITOT 0.9  PROT 6.4*  ALBUMIN 3.0*   No results for input(s): LIPASE, AMYLASE in the last 72 hours. Cardiac Enzymes  Recent Labs   11/03/15 0105 11/03/15 1109 11/03/15 2232  TROPONINI 0.23* 15.62* >65.00*   Thyroid Function Tests  Recent Labs  11/03/15 1109  TSH 0.447    Radiology Studies Imaging results have been reviewed and No results found.  TELE Occ PACs and PVCs, no sustained arrhythmia  ECG NSR, LVH, no real change from yesterday  ASSESSMENT AND PLAN  1. Acute lateral NSTEMI due to thrombotic ramus intermedius occlusion s/p DES (and chronic LAD occlusion with anteroapical scar) - substantial myocardial injury, but also some degree of stunning - ASA+Plavix+Xarelto for 30 days, stop ASA after 30 days. Plavix + Xarelto minimum of 12 months, preferably lifelong - echo in 3 months  - ideally will place back on true beta blocker, preferably carvedilol - normal renal function, restart ACEi  2. Acute on chronic combined systolic and diastolic HF - improved, probably close to euvolemia - on daily dose loop diuretic and spironolactone - plan long term carvedilol and ACEi, maybe Entresto if affordable - increase lisinopril and decrease hydralazine, switch labetalol to carvedilol - diltiazem contraindicated - echo in 3 months to discuss need for AICD  3. History of atrial fibrillation and atrial flutter with 1:1 conduction (leading to syncope) - avoid diltiazem and verapamil due to low EF, prefer beta blocker - on digoxin and Xarelto - switch from labetalol to carvedilol today  4. Cocaine abuse - told him in clear stark terms that this is a lethal habit and that it will have a severe adverse interaction with the medications he needs for CAD and CHF, specifically his beta blocker. Combined alpha+beta blockade with carvedilol is preferable to metoprolol. - he seems sincere when he says he will never use cocaine again; I don't think he is an addict, but clearly makes poor choices.  5. Severe HTN - currently on multiple meds, ideally will gradually simplify to max dose ACEi/ARB or Entresto,  spironolactone and carvedilol. Target BP is 120s/70s. - multiple med changes today, will watch for another day  6. Bronchopneumonia - doubtful diagnosis, was probably all CHF. Recheck CXR.  7. Tobacco abuse/probable COPD - focus on cocaine abuse first, important to quit cigarettes also.  8. Possible OSA - needs sleep study  9. DM type 2 on OAD, poorly controlled - A1c was 9.6% in December  10. GERD - add H2 blocker   [name], MD, [facility] [phone] office [phone] pager 11/05/2015 8:41 AM

## 2015-11-05 NOTE — Hospital Discharge Follow-Up (Signed)
The patient has been followed in the [facility] ( [facility])  prior to his hospitalization.  Met with him to discuss his plans for follow up after discharge and he is agreeable to continuing to receive care at the TCC. He has an appointment scheduled for 11/11/15@ 1000 and he is aware of the appointment . The information is on the AVS.  He said that transportation is not a problem and he will be at his appointment.    He said that he has no problems obtaining his medications.   He said that he has all of the information that he needs for his [employer] application except the "letter of support" and he is working on that. He said that he is aware of the services provided by the financial counselors at the [facility]. He also noted that he has a meeting at [facility] on 11/07/15 to begin a medicaid application.   He reported no other problems/questions at this time.  Update provided to Marvetta Gibbons, RN CM.

## 2015-11-05 NOTE — Progress Notes (Signed)
Pateint refuses NTg S.L. And O2 / Will get EKG. PAteitn does not think it is his heart. R.N. Aware.

## 2015-11-05 NOTE — Progress Notes (Addendum)
[facility] PROGRESS NOTE  Sean Hinton WVP:[id number] DOB: 1961/04/28 DOA: 11/02/2015 PCP: Minerva Ends, MD  Assessment/Plan: Non-ST segment elevation myocardial infarction -Sean Hinton having a history of coronary artery disease, having his last cardiac catheterization performed on 02/24/2015 that revealed severe multivessel disease; mid LAD 100% stenosed, first obtuse marginal branch 80% stenosed, right posterior descending artery-2 70% stenosed, right posterior descending artery-1 50% stenosed -significant troponin elevation from 0.23 to 15.62 -Aspirin 325 mg by mouth daily, Lipitor 80 mg by mouth daily, labetalol 100 mg by mouth 3 times a day -S/P cardiac catheterization on 11/03/2015, found to have severe 3 vessel disease with PCI to Ramus lesion with drug eluding stent. Now on dual agent antiplatelet therapy with ASA and Plavix.   Question pneumonia. -Patient was treated for community-acquired pneumonia during hospitalization for 10/13/2015 through 10/17/2015, discharged on Ceftin.  -Initial chest x-ray showing patchy airspace opacities that could possibly reflect bronchopneumonia. He was initially started on Vancomycin and Cefepime.  -I suspect symptoms likely related to CHF and NSTEMI. He remains afebrile, WBC's within normal range, nontoxic appearing. D/c abx  Acute on chronic systolic congestive heart failure -Patient having history of ischemic cardiomyopathy, last transthoracic echocardiogram was performed with 10/14/2015 that showed EF of 35-40% with severe hypokinesis in the apical lateral septal and apical myocardium -Presented with complaints of shortness of breath. -I suspect NSTEMI precipitating acute CHF -He was given 40 mg of IV Lasix in the emergency department.  Acute hypoxemic respiratory failure -Evidence by patient presented in respiratory distress, having a respiratory rate of 40, placed on noninvasive positive pressure ventilation -Likely secondary to  acute on chronic systolic congestive heart failure in setting of non-ST segment elevation myocardial infarction -Respiratory status improved with initiation of Lasix -Chest x-ray showed a possible pneumonia however, I think symptoms likely secondary to CHF/NSTEMI. Will monitor him off of antimicrobial therapy.  -not on O2   History of polysubstance abuse -Urine drug screen was positive for cocaine. Looking back he has had previous positive urine drug screen for cocaine (on 10/14/2015) -Patient counseled.    Hypertension -He was started on NGT gtt by cardiology.  -Blood pressures improved, continue Labetalol 100 mg TID  Diabetes Mellitus -increase glipizide  Esophageal spasm -outpatient GI follow up  Code Status: Full Code Family Communication: no family Disposition Plan: d/c in AM?   Consultants:  Cardiology  Procedures  Cardiac catheterization Impression: 1. Severe 3 vessel CAD with total occlusion of the LAD (chronic), total thrombotic occlusion of the ramus intermedius (acute), and severe diffuse distal vessel disease in the RCA.  2. Severely elevated LVEDP with acute pulmonary edema, improved during the course of the procedure with IV furosemide and NTG and coronary reperfusion 3. A drug-eluting stent was placed. Post-stent angioplasty was performed. Maximum pressure: 14 atm. The post-interventional distal flow is normal (TIMI 3). The intervention was successful. No complications occurred at this lesion. There is total occlusion of a large intermediate branch with associated thrombus. Angiomax is used for anticoagulation. The patient is given plavix 600 mg x 1. An XB-LAD guide is used and a [employer], retail is advanced beyond the lesion. The lesion is dilated with a 2.5 mm balloon and TIMI-3 flow is restored  HPI/Subjective: Up walking around No CP Pain when swallowing  Objective: Filed Vitals:   11/05/15 0510 11/05/15 0945  BP: 138/88 120/71  Pulse: 96 100  Temp: 99.1 F  (37.3 C)   Resp: 18     Intake/Output Summary (Last 24 hours) at 11/05/15  [place] filed at 11/05/15 0951  Gross per 24 hour  Intake  363.1 ml  Output   1100 ml  Net -736.9 ml   Filed Weights   11/02/15 2336 11/04/15 0207 11/05/15 0510  Weight: 77.111 kg (170 lb) 72.3 kg (159 lb 6.3 oz) 70.3 kg (154 lb 15.7 oz)    Exam:   General:  NAD  Cardiovascular: Tachycardic, regular rate and rhythm, 2/6 SEM  Respiratory: Has bilateral crackles and rhonchi. Appears dyspneic at rest.   Abdomen: Soft, nontender nondistended  Musculoskeletal: He has pain with palpation over anterior chest wall.   Data Reviewed: Basic Metabolic Panel:  Recent Labs Lab 11/03/15 0105 11/03/15 1109 11/04/15 0345 11/05/15 0435  NA 138 136 133* 136  K 4.4 3.8 4.0 3.9  CL 103 103 99* 100*  CO2 25 25 25 25   GLUCOSE 255* 261* 273* 179*  BUN 19 16 17 17   CREATININE 1.34* 1.11 1.19 1.08  CALCIUM 8.9 9.0 8.6* 8.7*   Liver Function Tests:  Recent Labs Lab 11/03/15 1109  AST 101*  ALT 42  ALKPHOS 102  BILITOT 0.9  PROT 6.4*  ALBUMIN 3.0*   No results for input(s): LIPASE, AMYLASE in the last 168 hours. No results for input(s): AMMONIA in the last 168 hours. CBC:  Recent Labs Lab 11/03/15 0006 11/03/15 1109 11/04/15 0345 11/05/15 0435  WBC 8.9 10.5 10.0 10.1  NEUTROABS 6.8 8.3*  --   --   HGB 13.8 13.5 13.4 13.8  HCT 41.6 39.9 39.8 41.0  MCV 85.2 84.9 85.6 84.9  PLT 159 208 170 163   Cardiac Enzymes:  Recent Labs Lab 11/03/15 0105 11/03/15 1109 11/03/15 2232  TROPONINI 0.23* 15.62* >65.00*   BNP (last 3 results)  Recent Labs  10/14/15 0830 10/15/15 0350 11/03/15 0004  BNP 1219.9* 606.7* 571.9*    ProBNP (last 3 results) No results for input(s): PROBNP in the last 8760 hours.  CBG:  Recent Labs Lab 11/04/15 0845 11/04/15 1101 11/04/15 1622 11/04/15 2114 11/05/15 0632  GLUCAP 261* 161* 215* 241* 217*    Recent Results (from the past 240 hour(s))   Blood culture (routine x 2)     Status: None (Preliminary result)   Collection Time: 11/03/15  1:06 AM  Result Value Ref Range Status   Specimen Description BLOOD LEFT UPPER ARM  Final   Special Requests BOTTLES DRAWN AEROBIC AND ANAEROBIC 5CC   Final   Culture NO GROWTH 2 DAYS  Final   Report Status PENDING  Incomplete  Blood culture (routine x 2)     Status: None (Preliminary result)   Collection Time: 11/03/15  1:13 AM  Result Value Ref Range Status   Specimen Description BLOOD LEFT HAND  Final   Special Requests BOTTLES DRAWN AEROBIC AND ANAEROBIC 5CC  Final   Culture NO GROWTH 2 DAYS  Final   Report Status PENDING  Incomplete  MRSA PCR Screening     Status: None   Collection Time: 11/03/15  5:14 PM  Result Value Ref Range Status   MRSA by PCR NEGATIVE NEGATIVE Final    Comment:        The GeneXpert MRSA Assay (FDA approved for NASAL specimens only), is one component of a comprehensive MRSA colonization surveillance program. It is not intended to diagnose MRSA infection nor to guide or monitor treatment for MRSA infections.      Studies: No results found.  Scheduled Meds: . aspirin  81 mg Oral Daily  . atorvastatin  80 mg Oral QPM  . carvedilol  12.5 mg Oral BID WC  . clopidogrel  75 mg Oral Q breakfast  . digoxin  0.125 mg Oral Daily  . famotidine  20 mg Oral BID  . furosemide  40 mg Oral Daily  . glipiZIDE  5 mg Oral BID AC  . hydrALAZINE  50 mg Oral TID  . insulin aspart  0-9 Units Subcutaneous TID WC  . ipratropium-albuterol  3 mL Nebulization BID  . isosorbide dinitrate  20 mg Oral TID  . lisinopril  20 mg Oral Daily  . pantoprazole  40 mg Oral Daily  . potassium chloride SA  20 mEq Oral Daily  . rivaroxaban  20 mg Oral Daily  . sodium chloride  3 mL Intravenous Q12H  . sodium chloride  3 mL Intravenous Q12H  . sodium chloride  3 mL Intravenous Q12H  . spironolactone  25 mg Oral Daily   Continuous Infusions:    Principal Problem:   NSTEMI (non-ST  elevated myocardial infarction) ([place]) Active Problems:   Cardiomyopathy, ischemic   Chest pain   Acute respiratory failure with hypoxia (HCC)   PAF (paroxysmal atrial fibrillation) ([place])   Type 2 diabetes mellitus with vascular disease ([place])   Acute on chronic combined systolic and diastolic congestive heart failure ([place])   Severe essential hypertension    Time spent: 25 min    [facility] Pager [phone]. If 7PM-7AM, please contact night-coverage at [email or web address], password [facility] 11/05/2015, 11:56 AM  LOS: 2 days

## 2015-11-05 NOTE — Progress Notes (Signed)
Pain down to 5/10. Patient setting up eating sandwich," I got to put somethingI my stomach." Enc. Patient to stop eating and rest.

## 2015-11-05 NOTE — Progress Notes (Signed)
PatieNT has had 2 episodes Of epigastic / SScp That he feels is brought on after eating and drinking. PAIN 10/10, V.S.S., skin warm and dry.Got relief with Morphine 1 mg I.V. Each time. He states that Protonix does not work for his Reflux/GERD. He is o;n Dexilant 30 mg po daily at home. Patient was talking on phone after this last episode says pain down to 8/10. Also earlier tonight he was taking and eating sandwich from [employer] when he had his first episode [place]. R.N. Aware .t

## 2015-11-05 NOTE — Progress Notes (Signed)
RN paged Onalee Hua related to pt having 10 beats of Vtach. MD aware. Pt asymptomatic resting in bed. VSS. RN will continue to monitor.  Roselie Awkward, RN

## 2015-11-05 NOTE — Progress Notes (Signed)
CARDIAC REHAB PHASE I   PRE:  Rate/Rhythm: 97 SR    BP: sitting 116/90    SaO2: 96 RA  MODE:  Ambulation: 350 ft   POST:  Rate/Rhythm: 105 ST (115 briefly)    BP: sitting 102/80     SaO2: 93 RA  Pt slightly unsteady walking, ? Due to pain meds. First time out of bed. HR elevated, BP lower after walk. Some SOB but pt sts "I'm fine". Continues to complain of epigastric pain with drinking water. He was able to eat a lemon italian ice without problem (did discuss sugars in this). Ed completed. Somewhat receptive. Diet will probably not change much (discussed carb counting and sodium restrictions). Understands importance of meds/Plavix. Will refer to [facility] in [place] again (did last year). Encouraged more walking.   [id number]  Elissa Lovett Edison CES, ACSM 11/05/2015 11:04 AM

## 2015-11-06 ENCOUNTER — Other Ambulatory Visit: Payer: Self-pay | Admitting: Physician Assistant

## 2015-11-06 DIAGNOSIS — R0683 Snoring: Secondary | ICD-10-CM

## 2015-11-06 DIAGNOSIS — R7989 Other specified abnormal findings of blood chemistry: Secondary | ICD-10-CM

## 2015-11-06 DIAGNOSIS — I519 Heart disease, unspecified: Secondary | ICD-10-CM

## 2015-11-06 DIAGNOSIS — R079 Chest pain, unspecified: Secondary | ICD-10-CM

## 2015-11-06 DIAGNOSIS — E119 Type 2 diabetes mellitus without complications: Secondary | ICD-10-CM

## 2015-11-06 LAB — CBC
HCT: 39 % (ref 39.0–52.0)
Hemoglobin: 12.9 g/dL — ABNORMAL LOW (ref 13.0–17.0)
MCH: 28.4 pg (ref 26.0–34.0)
MCHC: 33.1 g/dL (ref 30.0–36.0)
MCV: 85.7 fL (ref 78.0–100.0)
PLATELETS: 156 10*3/uL (ref 150–400)
RBC: 4.55 MIL/uL (ref 4.22–5.81)
RDW: 14.8 % (ref 11.5–15.5)
WBC: 9.3 10*3/uL (ref 4.0–10.5)

## 2015-11-06 LAB — GLUCOSE, CAPILLARY
Glucose-Capillary: 150 mg/dL — ABNORMAL HIGH (ref 65–99)
Glucose-Capillary: 200 mg/dL — ABNORMAL HIGH (ref 65–99)

## 2015-11-06 MED ORDER — TRAMADOL HCL 50 MG PO TABS
50.0000 mg | ORAL_TABLET | Freq: Four times a day (QID) | ORAL | Status: DC | PRN
Start: 2015-11-06 — End: 2015-11-25

## 2015-11-06 MED ORDER — ASPIRIN 81 MG PO CHEW: 81.0000 mg | CHEWABLE_TABLET | Freq: Every day | ORAL | Status: DC

## 2015-11-06 MED ORDER — GLIPIZIDE 10 MG PO TABS: 10.0000 mg | ORAL_TABLET | Freq: Two times a day (BID) | ORAL | Status: DC

## 2015-11-06 MED ORDER — LISINOPRIL 20 MG PO TABS: 20.0000 mg | ORAL_TABLET | Freq: Every day | ORAL | Status: DC

## 2015-11-06 MED ORDER — ISOSORBIDE DINITRATE 20 MG PO TABS: 20.0000 mg | ORAL_TABLET | Freq: Three times a day (TID) | ORAL | Status: DC

## 2015-11-06 MED ORDER — CARVEDILOL 12.5 MG PO TABS
12.5000 mg | ORAL_TABLET | Freq: Two times a day (BID) | ORAL | Status: DC
Start: 2015-11-06 — End: 2015-11-19

## 2015-11-06 MED ORDER — HYDRALAZINE HCL 50 MG PO TABS: 50.0000 mg | ORAL_TABLET | Freq: Three times a day (TID) | ORAL | Status: DC

## 2015-11-06 MED ORDER — CLOPIDOGREL BISULFATE 75 MG PO TABS: 75.0000 mg | ORAL_TABLET | Freq: Every day | ORAL | Status: DC

## 2015-11-06 MED FILL — NITROSTAT 0.4 MG TABLET SL: 0.4 | 25 days supply | Qty: 25 | Fill #1

## 2015-11-06 MED FILL — hydrALAZINE HCL 50 MG TABS: 50 | 30 days supply | Qty: 90 | Fill #0

## 2015-11-06 MED FILL — ?CARVEDILOL 12.5 MG TABLET: 12.5 | 30 days supply | Qty: 60 | Fill #0

## 2015-11-06 MED FILL — ?LISINOPRIL 20 MG TABLET: 20 | 30 days supply | Qty: 30 | Fill #0

## 2015-11-06 MED FILL — ?GLIPIZIDE 10 MG TABLET: 10 | 30 days supply | Qty: 60 | Fill #0

## 2015-11-06 MED FILL — CLOPIDOGREL 75 MG TABLET: 75 | 30 days supply | Qty: 30 | Fill #0

## 2015-11-06 NOTE — Progress Notes (Signed)
Discussed with the patient and his wife and all questioned fully answered. He will call me if any problems arise. Pt able to teach back/verbalize living with heart failure instructions.   Leonidas Romberg, RN

## 2015-11-06 NOTE — Care Management Note (Signed)
Case Management Note Donn Pierini RN, BSN Unit 2W-Case Manager [phone]  Patient Details  Name: Sean Hinton MRN: [id number] Date of Birth: 20-Jan-1961  Subjective/Objective:    Pt admitted with NSTEMI                Action/Plan: PTA pt lived at home- followed by [facility] at [facility]- appointment made for 11/11/15 at the [facility]CC- pt has neb. Machine at home- also was assisted with medication through [facility] 10/17/15- and is not eligible for any further assistance at this time.   Expected Discharge Date:    11/06/15              Expected Discharge Plan:  Home/Self Care  In-House Referral:     Discharge planning Services  CM Consult  Post Acute Care Choice:    Choice offered to:     DME Arranged:    DME Agency:     HH Arranged:    HH Agency:     Status of Service:  Completed, signed off  Medicare Important Message Given:    Date Medicare IM Given:    Medicare IM give by:    Date Additional Medicare IM Given:    Additional Medicare Important Message give by:     If discussed at Long Length of Stay Meetings, dates discussed:    Discharge Disposition: Home/self care   Additional Comments:  Darrold Span, RN 11/06/2015, 3:52 PM

## 2015-11-06 NOTE — Discharge Summary (Addendum)
Physician Discharge Summary  Sean Hinton ZOX:[id number] DOB: [birth year] DOA: 11/02/2015  PCP: Lora Paula, MD  Admit date: 11/02/2015 Discharge date: 11/06/2015  Time spent: 35 minutes  Recommendations for Outpatient Follow-up:  1. Needs to stop cocaine 2. Needs to stop smoking 3. Echo 3 months with cards 4. Outpatient sleepy study ASA+Plavix+Xarelto for 30 days, stop ASA after 30 days. Plavix + Xarelto minimum of 12 months, preferably lifelong May need GI referral  Discharge Diagnoses:  Principal Problem:   NSTEMI (non-ST elevated myocardial infarction) (HCC) Active Problems:   Cardiomyopathy, ischemic   Chest pain   Acute respiratory failure with hypoxia (HCC)   PAF (paroxysmal atrial fibrillation) (HCC)   Type 2 diabetes mellitus with vascular disease (HCC)   Acute on chronic combined systolic and diastolic congestive heart failure (HCC)   Severe essential hypertension   Discharge Condition: improved  Diet recommendation: cardiac  Filed Weights   11/04/15 0207 11/05/15 0510 11/06/15 0614  Weight: 72.3 kg (159 lb 6.3 oz) 70.3 kg (154 lb 15.7 oz) 69.3 kg (152 lb 12.5 oz)    History of present illness:  Sean Hinton is a 54 y.o. male with history of CAD, systolic CHF, polysubstance abuse, paroxysmal defibrillation and diabetes mellitus presents to the ER because of chest pain and shortness of breath. Patient's symptoms started last evening. Patient states he had shovelled snow following which he had some lunch and was taking restaurant suddenly started developing chest pain which was pressure-like retrosternal nonradiating. Patient also had shortness of breath. He took all his home medication despite which patient's chest pain and shortness of breath persisted. Has been having some cough which patient states has been chronic. Denies any fever chills nausea vomiting abdominal pain. Since his symptoms did not get better he came to the ER. Troponin the ER is mildly  elevated. EKG shows nonspecific changes. Chest x-ray shows infiltrates concerning for pneumonia. BNP is mildly elevated. Patient was initially placed on BiPAP for 2 hours following which patient's breathing improved. Patient still has chest pressure. Urine drug screen is positive for cocaine. Patient is being admitted for further management. Patient was recently admitted last month for CHF or pneumonia at the time patient also was in A. fib with RVR.  Hospital Course:  1. Acute lateral NSTEMI due to thrombotic ramus intermedius occlusion s/p DES (and chronic LAD occlusion with anteroapical scar) - substantial myocardial injury, but also some degree of stunning - ASA+Plavix+Xarelto for 30 days, stop ASA after 30 days. Plavix + Xarelto minimum of 12 months, preferably lifelong - echo in 3 months  - no signs of problems after switched to carvedilol - normal renal function, restarted ACEi  2. Acute on chronic combined systolic and diastolic HF - improved, probably close to euvolemia, no evidence of high filling pressure on 1/10 echo - on daily dose loop diuretic and spironolactone - plan long term carvedilol and ACEi, maybe Entresto if affordable - still on "Bidil equivalent" - may try to simplify as we maximize ACEi/betabl doses - diltiazem contraindicated - echo in 3 months to discuss need for AICD  3. History of atrial fibrillation and atrial flutter with 1:1 conduction (leading to syncope) - avoid diltiazem and verapamil due to low EF, prefer beta blocker - on digoxin/carvedilol and Xarelto  4. Cocaine abuse - encouraged cessation  5. Severe HTN - currently on multiple meds, ideally will gradually simplify to max dose ACEi/ARB or Entresto, spironolactone and carvedilol. Target BP is 120s/70s. - multiple med changes recently, will  watch for another day  6. Tobacco abuse/probable COPD -encouraged cessation  7. GERD - better on H2 blocker; he seems to be able to distinguish it from  angina  8. Possible OSA  - needs sleep study  9. DM type 2 on OAD, poorly controlled - A1c was 9.6% in December  Procedures:    Consultations:  cardiology  Discharge Exam: Filed Vitals:   11/05/15 2019 11/06/15 0614  BP: 113/74 133/96  Pulse: 89 77  Temp: 98.4 F (36.9 C) 99.5 F (37.5 C)  Resp: 18 18     Discharge Instructions   Discharge Instructions    Amb Referral to Cardiac Rehabilitation    Complete by:  As directed   Diagnosis:   Myocardial Infarction PCI       Diet - low sodium heart healthy    Complete by:  As directed      Diet Carb Modified    Complete by:  As directed      Discharge instructions    Complete by:  As directed   Outpatient sleep study ASA+Plavix+Xarelto for 30 days, stop ASA after 30 days. Plavix + Xarelto minimum of 12 months, preferably lifelong     Increase activity slowly    Complete by:  As directed           Current Discharge Medication List    START taking these medications   Details  aspirin 81 MG chewable tablet Chew 1 tablet (81 mg total) by mouth daily. Qty: 30 tablet, Refills: 0    carvedilol (COREG) 12.5 MG tablet Take 1 tablet (12.5 mg total) by mouth 2 (two) times daily with a meal. Qty: 60 tablet, Refills: 0    clopidogrel (PLAVIX) 75 MG tablet Take 1 tablet (75 mg total) by mouth daily with breakfast. Qty: 30 tablet, Refills: 0    isosorbide dinitrate (ISORDIL) 20 MG tablet Take 1 tablet (20 mg total) by mouth 3 (three) times daily. Qty: 90 tablet, Refills: 0    lisinopril (PRINIVIL,ZESTRIL) 20 MG tablet Take 1 tablet (20 mg total) by mouth daily. Qty: 30 tablet, Refills: 0    traMADol (ULTRAM) 50 MG tablet Take 1 tablet (50 mg total) by mouth every 6 (six) hours as needed for severe pain. Qty: 10 tablet, Refills: 0      CONTINUE these medications which have CHANGED   Details  glipiZIDE (GLUCOTROL) 10 MG tablet Take 1 tablet (10 mg total) by mouth 2 (two) times daily before a meal. Qty: 60 tablet,  Refills: 0    hydrALAZINE (APRESOLINE) 50 MG tablet Take 1 tablet (50 mg total) by mouth 3 (three) times daily. Qty: 90 tablet, Refills: 0      CONTINUE these medications which have NOT CHANGED   Details  albuterol (PROVENTIL HFA;VENTOLIN HFA) 108 (90 BASE) MCG/ACT inhaler Inhale 2 puffs into the lungs every 6 (six) hours as needed for wheezing or shortness of breath. Qty: 1 Inhaler, Refills: 2    atorvastatin (LIPITOR) 80 MG tablet Take 1 tablet (80 mg total) by mouth every evening. Qty: 30 tablet, Refills: 3    Blood Glucose Monitoring Suppl (TRUE METRIX METER) DEVI 1 Device by Does not apply route 3 (three) times daily. Qty: 1 Device, Refills: 0    Dexlansoprazole (DEXILANT) 30 MG capsule Take 1 capsule (30 mg total) by mouth daily. Qty: 90 capsule, Refills: 3   Associated Diagnoses: Gastroesophageal reflux disease, esophagitis presence not specified    digoxin (LANOXIN) 0.125 MG tablet Take 1 tablet (  0.125 mg total) by mouth daily. Qty: 30 tablet, Refills: 3    furosemide (LASIX) 40 MG tablet Take 1 tablet (40 mg total) by mouth daily. Qty: 30 tablet, Refills: 3    glucose blood (TRUE METRIX BLOOD GLUCOSE TEST) test strip Use as instructed Qty: 100 each, Refills: 12    ipratropium-albuterol (DUONEB) 0.5-2.5 (3) MG/3ML SOLN Take 3 mLs by nebulization 2 (two) times daily. Qty: 360 mL, Refills: 10    nitroGLYCERIN (NITROSTAT) 0.4 MG SL tablet Place 1 tablet (0.4 mg total) under the tongue every 5 (five) minutes as needed for chest pain (up to 3 doses). Qty: 25 tablet, Refills: 3    potassium chloride SA (K-DUR,KLOR-CON) 20 MEQ tablet Take 1 tablet (20 mEq total) by mouth daily. Qty: 30 tablet, Refills: 3    rivaroxaban (XARELTO) 20 MG TABS tablet Take 1 tablet (20 mg total) by mouth daily with supper. Qty: 30 tablet, Refills: 3    spironolactone (ALDACTONE) 25 MG tablet Take 1 tablet (25 mg total) by mouth daily. Qty: 30 tablet, Refills: 3    TRUEPLUS LANCETS 28G MISC 1  each by Does not apply route 3 (three) times daily. Qty: 100 each, Refills: 12      STOP taking these medications     diltiazem (CARDIZEM CD) 240 MG 24 hr capsule      isosorbide mononitrate (IMDUR) 30 MG 24 hr tablet        No Known Allergies Follow-up Information    Follow up with [facility]. Go on 11/11/2015.   Specialty:  Internal Medicine   Why:  at 10:00am for an appointment in the [facility] with Dr Venetia Night.    Contact information:   [address] [id number] [address] [place] [address] [phone]      Please follow up.   Why:  echo 3 months      Schedule an appointment as soon as possible for a visit with [name], MD.   Specialty:  Cardiology   Contact information:   [address] [place] [address] [phone]        The results of significant diagnostics from this hospitalization (including imaging, microbiology, ancillary and laboratory) are listed below for reference.    Significant Diagnostic Studies: Dg Chest 2 View  11/03/2015  CLINICAL DATA:  Central and RIGHT-sided chest pain or shortness of breath tonight. History of hypertension, diabetes, atrial fibrillation, pneumonia, renal failure, CHF. EXAM: CHEST  2 VIEW COMPARISON:  Chest radiograph October 15, 2015 FINDINGS: Cardiomediastinal silhouette is unremarkable. Diffuse interstitial prominence with patchy airspace opacities, predominant LEFT lung base. No pleural effusion. No pneumothorax. Soft tissue planes and included osseous structures are nonsuspicious. IMPRESSION: Interstitial prominence, with patchy airspace opacities concerning for bronchopneumonia. Followup PA and lateral chest X-ray is recommended in 3-4 weeks following trial of antibiotic therapy to ensure resolution and exclude underlying malignancy. Electronically Signed   By: Awilda Metro M.D.   On: 11/03/2015 00:35   Dg Chest 2 View  10/15/2015  CLINICAL  DATA:  Shortness of breath, cough today EXAM: CHEST  2 VIEW COMPARISON:  10/13/2015 FINDINGS: Small layering effusions bilaterally. Heart is normal size. No confluent airspace opacities or acute bony abnormality. IMPRESSION: Small bilateral layering effusions. Electronically Signed   By: Charlett Nose M.D.   On: 10/15/2015 13:10   Dg Chest 2 View  10/13/2015  CLINICAL DATA:  Initial evaluation for acute chest pain for 1 month. EXAM: CHEST  2 VIEW  COMPARISON:  Prior radiograph from 02/20/2015. FINDINGS: Cardiomegaly is stable from previous study. Mediastinal silhouette within normal limits. Lung volumes within normal limits. There is parenchymal opacity within the left lower lobe, suspicious for possible pneumonia. Additional patchy infiltrate within the right perihilar region, likely within the right upper and lower lobes. Find a concerning for possible infection. Mild scattered interstitial prominence without pulmonary edema. No pleural effusion. No pneumothorax. Osseous structures demonstrate no acute abnormality. Mild multilevel degenerative changes within the visualized spine. IMPRESSION: Patchy multi focal opacities within the left lower lobe and right perihilar region, suspicious for possible multi focal pneumonia. Electronically Signed   By: Rise Mu M.D.   On: 10/13/2015 03:49   Dg Chest Port 1 View  10/13/2015  CLINICAL DATA:  Chest pain and shortness of breath for 1 day. Fever and cough. Pneumonia. EXAM: PORTABLE CHEST 1 VIEW COMPARISON:  Earlier today FINDINGS: Progression of airspace opacity diffusely, patchy and asymmetric to the left. There were kerley lines and fissural thickening on prior chest x-ray. No effusion or air leak. Normal heart size and vascular pedicle width. IMPRESSION: Marked progression of bilateral pneumonia. Possible superimposed pulmonary edema. Electronically Signed   By: Marnee Spring M.D.   On: 10/13/2015 22:17   Ct Angio Chest Aorta W/cm &/or  Wo/cm  11/03/2015  CLINICAL DATA:  55 year old male with central chest pain. EXAM: CT ANGIOGRAPHY CHEST WITH CONTRAST TECHNIQUE: Multidetector CT imaging of the chest was performed using the standard protocol during bolus administration of intravenous contrast. Multiplanar CT image reconstructions and MIPs were obtained to evaluate the vascular anatomy. CONTRAST:  OMNIPAQUE IOHEXOL 350 MG/ML SOLN COMPARISON:  Radiograph dated 11/02/2015 FINDINGS: There are small bilateral pleural effusions. There is diffuse interstitial prominence with patchy areas of ground-glass airspace opacity predominantly involving the lower lobes compatible with pneumonia. Clinical correlation and follow-up resolution is recommended. There is no pneumothorax. The central airways are patent. The thoracic aorta appears unremarkable. Evaluation of the pulmonary arteries is limited due to suboptimal opacification of the peripheral branches. No central pulmonary artery embolus identified. There is no cardiomegaly or pericardial effusion. Top-normal bilateral hilar lymph nodes. There is coronary vascular calcification. The visualized esophagus and thyroid gland appear grossly unremarkable. There is no axillary adenopathy. The chest wall soft tissues appear unremarkable. Mild degenerative changes of the spine. No acute fracture. The visualized upper abdomen is unremarkable. Review of the MIP images confirms the above findings. IMPRESSION: No CT evidence of pulmonary embolism or aortic dissection. Small bilateral pleural effusions and diffuse bilateral ground-glass airspace opacities most compatible with pneumonia. Clinical correlation and follow-up recommended. Electronically Signed   By: Elgie Collard M.D.   On: 11/03/2015 06:49    Microbiology: Recent Results (from the past 240 hour(s))  Blood culture (routine x 2)     Status: None (Preliminary result)   Collection Time: 11/03/15  1:06 AM  Result Value Ref Range Status   Specimen  Description BLOOD LEFT UPPER ARM  Final   Special Requests BOTTLES DRAWN AEROBIC AND ANAEROBIC 5CC   Final   Culture NO GROWTH 3 DAYS  Final   Report Status PENDING  Incomplete  Blood culture (routine x 2)     Status: None (Preliminary result)   Collection Time: 11/03/15  1:13 AM  Result Value Ref Range Status   Specimen Description BLOOD LEFT HAND  Final   Special Requests BOTTLES DRAWN AEROBIC AND ANAEROBIC 5CC  Final   Culture NO GROWTH 3 DAYS  Final   Report Status  PENDING  Incomplete  MRSA PCR Screening     Status: None   Collection Time: 11/03/15  5:14 PM  Result Value Ref Range Status   MRSA by PCR NEGATIVE NEGATIVE Final    Comment:        The GeneXpert MRSA Assay (FDA approved for NASAL specimens only), is one component of a comprehensive MRSA colonization surveillance program. It is not intended to diagnose MRSA infection nor to guide or monitor treatment for MRSA infections.      Labs: Basic Metabolic Panel:  Recent Labs Lab 11/03/15 0105 11/03/15 1109 11/04/15 0345 11/05/15 0435  NA 138 136 133* 136  K 4.4 3.8 4.0 3.9  CL 103 103 99* 100*  CO2 25 25 25 25   GLUCOSE 255* 261* 273* 179*  BUN 19 16 17 17   CREATININE 1.34* 1.11 1.19 1.08  CALCIUM 8.9 9.0 8.6* 8.7*   Liver Function Tests:  Recent Labs Lab 11/03/15 1109  AST 101*  ALT 42  ALKPHOS 102  BILITOT 0.9  PROT 6.4*  ALBUMIN 3.0*   No results for input(s): LIPASE, AMYLASE in the last 168 hours. No results for input(s): AMMONIA in the last 168 hours. CBC:  Recent Labs Lab 11/03/15 0006 11/03/15 1109 11/04/15 0345 11/05/15 0435 11/06/15 0254  WBC 8.9 10.5 10.0 10.1 9.3  NEUTROABS 6.8 8.3*  --   --   --   HGB 13.8 13.5 13.4 13.8 12.9*  HCT 41.6 39.9 39.8 41.0 39.0  MCV 85.2 84.9 85.6 84.9 85.7  PLT 159 208 170 163 156   Cardiac Enzymes:  Recent Labs Lab 11/03/15 0105 11/03/15 1109 11/03/15 2232  TROPONINI 0.23* 15.62* >65.00*   BNP: BNP (last 3 results)  Recent Labs   10/14/15 0830 10/15/15 0350 11/03/15 0004  BNP 1219.9* 606.7* 571.9*    ProBNP (last 3 results) No results for input(s): PROBNP in the last 8760 hours.  CBG:  Recent Labs Lab 11/05/15 1156 11/05/15 1627 11/05/15 2016 11/06/15 0602 11/06/15 1121  GLUCAP 304* 125* 228* 150* 200*       Signed:  01/04/16 MD.  [facility] 11/06/2015, 12:10 PM

## 2015-11-06 NOTE — Progress Notes (Signed)
Patient Name: Sean Hinton Date of Encounter: 11/06/2015  Principal Problem:   NSTEMI (non-ST elevated myocardial infarction) [facility]) Active Problems:   Cardiomyopathy, ischemic   Chest pain   Acute respiratory failure with hypoxia (HCC)   PAF (paroxysmal atrial fibrillation) (HCC)   Type 2 diabetes mellitus with vascular disease (HCC)   Acute on chronic combined systolic and diastolic congestive heart failure (HCC)   Severe essential hypertension   Length of Stay: 3  SUBJECTIVE  Feels well, no dyspnea lying flat in bed or walking in the hallway. No angina. BP control is good. Very brief NSVT, asymptomatic.  CURRENT MEDS . aspirin  81 mg Oral Daily  . atorvastatin  80 mg Oral QPM  . calcium carbonate  1 tablet Oral TID WC  . carvedilol  12.5 mg Oral BID WC  . clopidogrel  75 mg Oral Q breakfast  . digoxin  0.125 mg Oral Daily  . famotidine  20 mg Oral BID  . furosemide  40 mg Oral Daily  . glipiZIDE  10 mg Oral BID AC  . hydrALAZINE  50 mg Oral TID  . insulin aspart  0-9 Units Subcutaneous TID WC  . ipratropium-albuterol  3 mL Nebulization BID  . isosorbide dinitrate  20 mg Oral TID  . lisinopril  20 mg Oral Daily  . pantoprazole  40 mg Oral Daily  . potassium chloride SA  20 mEq Oral Daily  . rivaroxaban  20 mg Oral Daily  . sodium chloride  3 mL Intravenous Q12H  . sodium chloride  3 mL Intravenous Q12H  . sodium chloride  3 mL Intravenous Q12H  . spironolactone  25 mg Oral Daily    OBJECTIVE   Intake/Output Summary (Last 24 hours) at 11/06/15 1058 Last data filed at 11/05/15 2214  Gross per 24 hour  Intake    483 ml  Output      0 ml  Net    483 ml   Filed Weights   11/04/15 0207 11/05/15 0510 11/06/15 0614  Weight: 159 lb 6.3 oz (72.3 kg) 154 lb 15.7 oz (70.3 kg) 152 lb 12.5 oz (69.3 kg)    PHYSICAL EXAM Filed Vitals:   11/05/15 1650 11/05/15 2019 11/05/15 2020 11/06/15 0614  BP: 114/64 113/74  133/96  Pulse: 104 89  77  Temp:  98.4 F (36.9 C)   99.5 F (37.5 C)  TempSrc:  Oral  Oral  Resp:  18  18  Height:      Weight:    152 lb 12.5 oz (69.3 kg)  SpO2:  99% 98% 100%   General: Alert, oriented x3, no distress Head: no evidence of trauma, PERRL, EOMI, no exophtalmos or lid lag, no myxedema, no xanthelasma; normal ears, nose and oropharynx Neck: normal jugular venous pulsations and no hepatojugular reflux; brisk carotid pulses without delay and no carotid bruits Chest: clear to auscultation, no signs of consolidation by percussion or palpation, normal fremitus, symmetrical and full respiratory excursions Cardiovascular: normal position and quality of the apical impulse, regular rhythm, normal first and second heart sounds, no rubs +ve S4 gallop, no murmur Abdomen: no tenderness or distention, no masses by palpation, no abnormal pulsatility or arterial bruits, normal bowel sounds, no hepatosplenomegaly Extremities: no clubbing, cyanosis or edema; 2+ radial, ulnar and brachial pulses bilaterally; 2+ right femoral, posterior tibial and dorsalis pedis pulses; 2+ left femoral, posterior tibial and dorsalis pedis pulses; no subclavian or femoral bruits Neurological: grossly nonfocal  LABS  CBC  Recent Labs  11/03/15 1109  11/05/15 0435 11/06/15 0254  WBC 10.5  < > 10.1 9.3  NEUTROABS 8.3*  --   --   --   HGB 13.5  < > 13.8 12.9*  HCT 39.9  < > 41.0 39.0  MCV 84.9  < > 84.9 85.7  PLT 208  < > 163 156  < > = values in this interval not displayed. Basic Metabolic Panel  Recent Labs  11/04/15 0345 11/05/15 0435  NA 133* 136  K 4.0 3.9  CL 99* 100*  CO2 25 25  GLUCOSE 273* 179*  BUN 17 17  CREATININE 1.19 1.08  CALCIUM 8.6* 8.7*   Liver Function Tests  Recent Labs  11/03/15 1109  AST 101*  ALT 42  ALKPHOS 102  BILITOT 0.9  PROT 6.4*  ALBUMIN 3.0*   Cardiac Enzymes  Recent Labs  11/03/15 1109 11/03/15 2232  TROPONINI 15.62* >65.00*   Thyroid Function Tests  Recent Labs  11/03/15 1109  TSH 0.447     Radiology Studies Imaging results have been reviewed and No results found.  TELE NSR, on 10 beat NSVT episode, asymptomatic    ASSESSMENT AND PLAN  1. Acute lateral NSTEMI due to thrombotic ramus intermedius occlusion s/p DES (and chronic LAD occlusion with anteroapical scar) - substantial myocardial injury, but also some degree of stunning - ASA+Plavix+Xarelto for 30 days, stop ASA after 30 days. Plavix + Xarelto minimum of 12 months, preferably lifelong - echo in 3 months  - no signs of problems after we switched to carvedilol - normal renal function, restarted ACEi  2. Acute on chronic combined systolic and diastolic HF - improved, probably close to euvolemia, no evidence of high filling pressure on 1/10 echo - on daily dose loop diuretic and spironolactone - plan long term carvedilol and ACEi, maybe Entresto if affordable - still on "Bidil equivalent" - may try to simplify as we maximize ACEi/betabl doses - diltiazem contraindicated - echo in 3 months to discuss need for AICD  3. History of atrial fibrillation and atrial flutter with 1:1 conduction (leading to syncope) - avoid diltiazem and verapamil due to low EF, prefer beta blocker - on digoxin/carvedilol and Xarelto  4. Cocaine abuse - told him in clear stark terms that this is a lethal habit and that it will have a severe adverse interaction with the medications he needs for CAD and CHF, specifically his beta blocker. Combined alpha+beta blockade with carvedilol is preferable to metoprolol. - he seems sincere when he says he will never use cocaine again; I don't think he is an addict, but clearly makes poor choices. His girlfriend and family are willing to help keep him on a healthy path. - failure to abstain from cocaine would disqualify him from AICD in my opinion.  5. Severe HTN - currently on multiple meds, ideally will gradually simplify to max dose ACEi/ARB or Entresto, spironolactone and carvedilol. Target BP  is 120s/70s. - multiple med changes recently, will watch for another day  6. Tobacco abuse/probable COPD - focus on cocaine abuse first, important to quit cigarettes also. He is "smoking" a plastic tube to help with behavior addiction today.  7. GERD - better on H2 blocker; he seems to be able to distinguish it from angina  8. Possible OSA  - needs sleep study  9. DM type 2 on OAD, poorly controlled - A1c was 9.6% in December   DC today, needs transition of care appointment in one week, office visit with me  first available, outpt. Sleep study, reevaluate EF in 3 months to decide on AICD.  [name], MD, [facility] [phone] office [phone] pager 11/06/2015 10:58 AM

## 2015-11-07 ENCOUNTER — Telehealth: Payer: Self-pay

## 2015-11-07 DIAGNOSIS — F1410 Cocaine abuse, uncomplicated: Secondary | ICD-10-CM | POA: Insufficient documentation

## 2015-11-07 NOTE — Telephone Encounter (Signed)
Transitional Care Clinic Post-discharge Follow-Up Phone Call:  Date of Discharge: 11/06/15 Principal Discharge Diagnosis(es): NSTEMI Call Completed: Yes                   With Whom: Patient    Please check all that apply:  X  Patient is knowledgeable of his/her condition(s) and/or treatment. X  Patient is caring for self at home.  ? Patient is receiving assist at home from family and/or caregiver. Family and/or caregiver is knowledgeable of patient's condition(s) and/or treatment. ? Patient is receiving home health services. If so, name of agency.     Medication Reconciliation:  X  Medication list reviewed with patient. X  Patient obtained all discharge medications-NO. Patient's medication list reviewed. Patient informed that per discharge summary he is to STOP taking diltiazem 240 mg 24 hour capsule and imdur 30 mg tablet. Patient verbalized understanding.  Patient indicated he has all medications except isosorbide dinitrate 20 mg tablet (Patient to take 1 tablet (20 mg total) three times daily.) and new nitroglycerin tablets. He indicated he was told that isosorbide dinitrate tablets would be ordered by [facility] and [facility] pharmacy. This Case Manager also informed patient that he has refills available of nitroglycerin tablets.   This Case Manager spoke with [facility], Pharmacy tech, and informed her that patient needing refill of his nitroglycerin tablets. She indicated medication ready for him to pick up. In addition, informed her that patient would be picking up isosorbide dinitrate today. She indicated she had not ordered medication yesterday because patient informed her he had medication. She indicated she would order medication today, but it would not be ready for pick-up until Monday 11/10/15. Spoke with Dr. Venetia Night about patient not getting isosorbide dinitrate until 11/10/15. She indicated patient should take his previous dose of isosorbide mononitrate (Imdur) daily until  isosorbide dinitrate (Isordil) comes in and then stop taking Imdur once available.  Informed patient who verbalized understanding.    Patient also aware that he will be taking ASA+Plavix+Xarelto for 30 days, then stop ASA after 30 days.  Plavis + Xarelto will be continued.   Activities of Daily Living:  X  Independent ? Needs assist  ? Total Care    Community resources in place for patient:  X  None  ? Home Health/Home DME ? Assisted Living ? Support Group         Questions/Concerns discussed: Patient aware of Transitional Care Clinic appointment on 11/11/15 at 1000 with Dr. Venetia Night. He verified he would have transportation to his appointment. Inquired about patient's status. He denied chest pain or chest tightness but indicated he was "tired."  He indicated he did "too much, too soon" after last discharge and will be increasing his activity slowly.  He indicated he has been having stomach discomfort after eating and the Cardiologists were aware while hospitalized. He indicated has been taking Dexilant as prescribed. Patient indicated he will be meeting with Financial Counselor at the hospital today to complete Medicaid application. He indicated he completed disability form on 11/05/15. No additional needs/concerns identified.

## 2015-11-08 LAB — CULTURE, BLOOD (ROUTINE X 2)
Culture: NO GROWTH
Culture: NO GROWTH

## 2015-11-10 ENCOUNTER — Telehealth: Payer: Self-pay

## 2015-11-10 NOTE — Telephone Encounter (Signed)
Call placed to the patient to remind him of his appointment at the [facility] tomorrow, 11/11/15 @ 1000.  He said that he would be there and he has transportation.  Instructed him to bring his medications with him and he stated that he would. Informed by Linna Hoff [facility] Pharmacy Tech , that the isosorbide dinitrate did not arrive today.  Dr Venetia Night notified and she noted that the patient is to continue to take the isosorbide mononitrate until the isosorbide dinitrate is available.  Instructed the patient to continue to take the isosorbide mononitrate until the isosorbide dinitrate arrives and he verbalized understanding that he will continue with his current dose. Also instructed him to check with the [facility] pharmacy tomorrow when he comes to his appointment to see if the isosorbide dinitrate has been delivered and again he stated that he would follow up tomorrow.

## 2015-11-11 ENCOUNTER — Encounter: Payer: Self-pay | Admitting: Family Medicine

## 2015-11-11 ENCOUNTER — Ambulatory Visit: Payer: Self-pay | Attending: Family Medicine | Admitting: Family Medicine

## 2015-11-11 VITALS — BP 116/80 | HR 90 | Temp 99.0°F | Resp 13 | Ht 70.0 in | Wt 163.0 lb

## 2015-11-11 DIAGNOSIS — I480 Paroxysmal atrial fibrillation: Secondary | ICD-10-CM | POA: Insufficient documentation

## 2015-11-11 DIAGNOSIS — Z7901 Long term (current) use of anticoagulants: Secondary | ICD-10-CM | POA: Insufficient documentation

## 2015-11-11 DIAGNOSIS — F17200 Nicotine dependence, unspecified, uncomplicated: Secondary | ICD-10-CM

## 2015-11-11 DIAGNOSIS — E1159 Type 2 diabetes mellitus with other circulatory complications: Secondary | ICD-10-CM | POA: Insufficient documentation

## 2015-11-11 DIAGNOSIS — I255 Ischemic cardiomyopathy: Secondary | ICD-10-CM | POA: Insufficient documentation

## 2015-11-11 DIAGNOSIS — R1013 Epigastric pain: Secondary | ICD-10-CM

## 2015-11-11 DIAGNOSIS — F17210 Nicotine dependence, cigarettes, uncomplicated: Secondary | ICD-10-CM | POA: Insufficient documentation

## 2015-11-11 DIAGNOSIS — I10 Essential (primary) hypertension: Secondary | ICD-10-CM | POA: Insufficient documentation

## 2015-11-11 DIAGNOSIS — Z7902 Long term (current) use of antithrombotics/antiplatelets: Secondary | ICD-10-CM | POA: Insufficient documentation

## 2015-11-11 DIAGNOSIS — I5022 Chronic systolic (congestive) heart failure: Secondary | ICD-10-CM | POA: Insufficient documentation

## 2015-11-11 DIAGNOSIS — I25118 Atherosclerotic heart disease of native coronary artery with other forms of angina pectoris: Secondary | ICD-10-CM

## 2015-11-11 DIAGNOSIS — Z955 Presence of coronary angioplasty implant and graft: Secondary | ICD-10-CM | POA: Insufficient documentation

## 2015-11-11 DIAGNOSIS — I25119 Atherosclerotic heart disease of native coronary artery with unspecified angina pectoris: Secondary | ICD-10-CM | POA: Insufficient documentation

## 2015-11-11 DIAGNOSIS — Z7982 Long term (current) use of aspirin: Secondary | ICD-10-CM | POA: Insufficient documentation

## 2015-11-11 DIAGNOSIS — E1165 Type 2 diabetes mellitus with hyperglycemia: Secondary | ICD-10-CM | POA: Insufficient documentation

## 2015-11-11 DIAGNOSIS — Z720 Tobacco use: Secondary | ICD-10-CM

## 2015-11-11 DIAGNOSIS — E785 Hyperlipidemia, unspecified: Secondary | ICD-10-CM | POA: Insufficient documentation

## 2015-11-11 DIAGNOSIS — I2582 Chronic total occlusion of coronary artery: Secondary | ICD-10-CM | POA: Insufficient documentation

## 2015-11-11 LAB — GLUCOSE, POCT (MANUAL RESULT ENTRY): POC Glucose: 294 mg/dl — AB (ref 70–99)

## 2015-11-11 MED ORDER — ISOSORBIDE MONONITRATE ER 30 MG PO TB24: 30.0000 mg | ORAL_TABLET | Freq: Every day | ORAL | Status: AC

## 2015-11-11 NOTE — Progress Notes (Signed)
TRANSITIONAL CARE CLINIC  Dates of telephone encounter: 10/20/15, 11/07/15  Hospitalization dates : 10/13/15 - 10/17/15; 11/02/15-11/06/15  Date of service face-to-face visit: 10/28/15  PCP: Dr. Armen Pickup   Subjective:  Patient ID: Sean Hinton, male    DOB: [birth year]  Age: 55 y.o. MRN: [id number]  CC: Follow-up   HPI Sean Hinton is a 55 year old male with a history of systolic heart failure (EF 30-35%), atrial fibrillation (on anticoagulation with Xarelto), coronary artery disease, hypertension, diabetes mellitus (A1c 9.6), tobacco abuse who comes in to the transitional care clinic for follow-up visit after a recent hospitalization for ischemic cardiomyopathy.  He had presented to the ED with chest pains which started after shoveling snow off three people's driveway. Troponins were elevated (0.23 ,15.62, >65.00), EKG revealed nonspecific T-wave changes, septal infarct, BNP was 571.9, drug screen was positive for cocaine. He was admitted and placed on BiPAP. Cardiology was consulted and he was taken to cardiac cath which revealed severe three-vessel CAD with total occlusion of the LAD (chronic), total thrombotic occlusion of the ramus intermedius (acute), and severe diffuse distal vessel disease in the RCA. Severely elevated LVEDP with acute pulmonary edema, improved during the course of the procedure with IV furosemide and nitroglycerin coronary reperfusion. He underwent PCI of the ramus intermedius with a DES with resulting 0% residual stenosis post PCI. Recommendation was for aspirin, Plavix and Xarelto for the next 30 days and to continue Plavix and Xarelto for a minimum of 12 months, preferably lifelong. He was placed on carvedilol and counseled on cessation of cocaine and subsequently discharged with recommendations to follow-up outpatient.  Last month he had a hospitalization for community-acquired pneumonia and was in A. fib with RVR which was treated with a Cardizem  drip.  Interval history: At his last visit I had increased the  dose of his glipizide to 10 mg twice daily due to uncontrolled diabetes and he reports improvement in his blood sugars however his sugar today is 294 which he attributes to eating a bowl of oatmeal and skittles not long before his visit today. He complains of pain in the region of his epigastrium and lower sternum when eating extremely hot or cold foods but pain is absent when he is not eating. He noticed this ever since his hospitalization but notes that pain has improved by the day. He denies any recent cocaine use but continues to smoke 5-6 sticks of cigarettes eyes working on quitting.       Outpatient Prescriptions Prior to Visit  Medication Sig Dispense Refill  . albuterol (PROVENTIL HFA;VENTOLIN HFA) 108 (90 BASE) MCG/ACT inhaler Inhale 2 puffs into the lungs every 6 (six) hours as needed for wheezing or shortness of breath. 1 Inhaler 2  . aspirin 81 MG chewable tablet Chew 1 tablet (81 mg total) by mouth daily. 30 tablet 0  . atorvastatin (LIPITOR) 80 MG tablet Take 1 tablet (80 mg total) by mouth every evening. 30 tablet 3  . Blood Glucose Monitoring Suppl (TRUE METRIX METER) DEVI 1 Device by Does not apply route 3 (three) times daily. 1 Device 0  . carvedilol (COREG) 12.5 MG tablet Take 1 tablet (12.5 mg total) by mouth 2 (two) times daily with a meal. 60 tablet 0  . clopidogrel (PLAVIX) 75 MG tablet Take 1 tablet (75 mg total) by mouth daily with breakfast. 30 tablet 0  . Dexlansoprazole (DEXILANT) 30 MG capsule Take 1 capsule (30 mg total) by mouth daily. 90 capsule 3  . digoxin (LANOXIN) 0.125 MG  tablet Take 1 tablet (0.125 mg total) by mouth daily. 30 tablet 3  . furosemide (LASIX) 40 MG tablet Take 1 tablet (40 mg total) by mouth daily. 30 tablet 3  . glipiZIDE (GLUCOTROL) 10 MG tablet Take 1 tablet (10 mg total) by mouth 2 (two) times daily before a meal. 60 tablet 0  . glucose blood (TRUE METRIX BLOOD GLUCOSE  TEST) test strip Use as instructed 100 each 12  . hydrALAZINE (APRESOLINE) 50 MG tablet Take 1 tablet (50 mg total) by mouth 3 (three) times daily. 90 tablet 0  . ipratropium-albuterol (DUONEB) 0.5-2.5 (3) MG/3ML SOLN Take 3 mLs by nebulization 2 (two) times daily. 360 mL 10  . isosorbide dinitrate (ISORDIL) 20 MG tablet Take 1 tablet (20 mg total) by mouth 3 (three) times daily. 90 tablet 0  . lisinopril (PRINIVIL,ZESTRIL) 20 MG tablet Take 1 tablet (20 mg total) by mouth daily. 30 tablet 0  . nitroGLYCERIN (NITROSTAT) 0.4 MG SL tablet Place 1 tablet (0.4 mg total) under the tongue every 5 (five) minutes as needed for chest pain (up to 3 doses). 25 tablet 3  . potassium chloride SA (K-DUR,KLOR-CON) 20 MEQ tablet Take 1 tablet (20 mEq total) by mouth daily. 30 tablet 3  . rivaroxaban (XARELTO) 20 MG TABS tablet Take 1 tablet (20 mg total) by mouth daily with supper. 30 tablet 3  . spironolactone (ALDACTONE) 25 MG tablet Take 1 tablet (25 mg total) by mouth daily. 30 tablet 3  . traMADol (ULTRAM) 50 MG tablet Take 1 tablet (50 mg total) by mouth every 6 (six) hours as needed for severe pain. 10 tablet 0  . TRUEPLUS LANCETS 28G MISC 1 each by Does not apply route 3 (three) times daily. 100 each 12   No facility-administered medications prior to visit.    ROS Review of Systems  Constitutional: Negative for activity change and appetite change.  HENT: Negative for sinus pressure and sore throat.   Eyes: Negative for visual disturbance.  Respiratory: Negative for cough, chest tightness and shortness of breath.   Cardiovascular: Negative for chest pain and leg swelling.  Gastrointestinal: Positive for abdominal pain (epigastric pain with swallowing). Negative for diarrhea, constipation and abdominal distention.  Endocrine: Negative.   Genitourinary: Negative for dysuria.  Musculoskeletal: Negative for myalgias and joint swelling.  Skin: Negative for rash.  Allergic/Immunologic: Negative.    Neurological: Negative for weakness, light-headedness and numbness.  Psychiatric/Behavioral: Negative for suicidal ideas and dysphoric mood.    Objective:  BP 116/80 mmHg  Pulse 90  Temp(Src) 99 F (37.2 C)  Resp 13  Ht 5\' 10"  (1.778 m)  Wt 163 lb (73.936 kg)  BMI 23.39 kg/m2  SpO2 97%  BP/Weight 11/11/2015 11/06/2015 10/28/2015  Systolic BP 116 133 151  Diastolic BP 80 96 106  Wt. (Lbs) 163 152.78 169.2  BMI 23.39 21.92 24.28      Physical Exam  Constitutional: He is oriented to person, place, and time. He appears well-developed and well-nourished.  HENT:  Head: Normocephalic and atraumatic.  Right Ear: External ear normal.  Left Ear: External ear normal.  Eyes: Conjunctivae and EOM are normal. Pupils are equal, round, and reactive to light.  Neck: Normal range of motion. Neck supple. No tracheal deviation present.  Cardiovascular: Normal rate, regular rhythm and normal heart sounds.   No murmur heard. Pulmonary/Chest: Effort normal and breath sounds normal. No respiratory distress. He has no wheezes. He exhibits no tenderness.  Abdominal: Soft. Bowel sounds are normal. He exhibits  no mass. There is no tenderness.  Musculoskeletal: Normal range of motion. He exhibits no edema or tenderness.  Neurological: He is alert and oriented to person, place, and time.  Skin: Skin is warm and dry.  Psychiatric: He has a normal mood and affect.     Assessment & Plan:   1. Type 2 diabetes mellitus with other circulatory complication, without long-term current use of insulin (HCC) Uncontrolled with A1c of 9.6. He has made significant improvement since increasing his dose of glipizide. - Glucose (CBG)  2. Essential hypertension Controlled  3. Hyperlipidemia Continue statin  4. Smoking He is working on quitting  5. PAF (paroxysmal atrial fibrillation) (HCC) Currently on anticoagulation with Xarelto  6. Cardiomyopathy, ischemic He has been unable to obtain isosorbide  dinitrate which is currently on back order and so I have replaced it with isosorbide mononitrate. Continue daily weight checks, limit fluids to less than 2 L per day  7. Coronary artery disease involving native coronary artery of native heart with other form of angina pectoris (HCC) Status post drug-eluting stent placement Continue dual antiplatelet therapy; stop aspirin after 30 days and continue Plavix. He will remain on both Plavix and Xarelto for a minimum of 12 months  8. Epigastric discomfort He is on a PPI and symptoms have been improving gradually. Symptoms could be secondary to esophageal spasm.   No orders of the defined types were placed in this encounter.    Follow-up: Return in about 2 weeks (around 11/25/2015) for Transitional care clinic-follow-up on  diabetes mellitus.   Jaclyn Shaggy MD

## 2015-11-11 NOTE — Patient Instructions (Signed)
Secondhand Smoke  WHAT IS SECONDHAND SMOKE?  Secondhand smoke is smoke that comes from burning tobacco. It could be the smoke from a cigarette, a pipe, or a cigar. Even if you are not the one smoking, secondhand smoke exposes you to the dangers of smoking. This is called involuntary, or passive, smoking. There are two types of secondhand smoke:  · Sidestream smoke is the smoke that comes off the lighted end of a cigarette, pipe, or cigar.    This type of smoke has the highest amount of cancer-causing agents (carcinogens).    The particles in sidestream smoke are smaller. They get into your lungs more easily.  · Mainstream smoke is the smoke that is exhaled by a person who is smoking.    This type of smoke is also dangerous to your health.  HOW CAN SECONDHAND SMOKE AFFECT MY HEALTH?  Studies show that there is no safe level of secondhand smoke. This smoke contains thousands of chemicals. At least 69 of them are known to cause cancer. Secondhand smoke can also cause many other health problems. It has been linked to:  · Lung cancer.  · Cancer of the voice box (larynx) or throat.  · Cancer of the sinuses.  · Brain cancer.  · Bladder cancer.  · Stomach cancer.  · Breast cancer.   · White blood cell cancers (lymphoma and leukemia).  · Brain and liver tumors in children.  · Heart disease and stroke in adults.  · Pregnancy loss (miscarriage).  · Diseases in children, such as:    Asthma.    Lung infections.    Ear infections.    Sudden infant death syndrome (SIDS).    Slow growth.  WHERE CAN I BE AT RISK FOR EXPOSURE TO SECONDHAND SMOKE?   · For adults, the workplace is the main source of exposure to secondhand smoke.    Your workplace should have a policy separating smoking areas from nonsmoking areas.    Smoking areas should have a system for ventilating and cleaning the air.  · For children, the home may be the most dangerous place for exposure to secondhand smoke.    Children who live in apartment buildings may be at  risk from smoke drifting from hallways or other people's homes.  · For everyone, many public places are possible sources of exposure to secondhand smoke.    These places include restaurants, shopping centers, and parks.  HOW CAN I REDUCE MY RISK FOR EXPOSURE TO SECONDHAND SMOKE?  The most important thing you can do is not smoke. Discourage family members from smoking. Other ways to reduce exposure for you and your family include the following:  · Keep your home smoke free.  · Make sure your child care providers do not smoke.  · Warn your child about the dangers of smoking and secondhand smoke.  · Do not allow smoking in your car. When someone smokes in a car, all the damaging chemicals from the smoke are confined in a small area.  · Avoid public places where smoking is allowed.     This information is not intended to replace advice given to you by your health care provider. Make sure you discuss any questions you have with your health care provider.     Document Released: 11/18/2004 Document Revised: 11/01/2014 Document Reviewed: 01/25/2014  Elsevier Interactive Patient Education ©2016 [employer]

## 2015-11-11 NOTE — Progress Notes (Signed)
Here for hospital follow up Reports pain upon swallowing food and drink if it is hot or cold He complains of slight sob He is still taking isosorbide 30 mg tablets.  Hospital changed him to 20 mg tabs but he states they are on order

## 2015-11-12 ENCOUNTER — Telehealth: Payer: Self-pay

## 2015-11-12 NOTE — Telephone Encounter (Signed)
This Case Manager placed call to patient to check on status and to inform him that Dr. Venetia Night wants him to remain on isosorbide mononitrate 30 daily since he was unable to obtain isosorbide dinitrate.  Informed patient that Dr. Venetia Night wants him to remain on isosorbide mononitrate 30 mg daily and medication reordered to [facility] and [facility] pharmacy if medication needed. Patient verbalized understanding. Patient had no new complaints or health concerns at this time. Patient aware of Cardiology appointment on 11/13/15 at 0930 with Dr. Lisabeth Devoid.  In addition, this Case Manager reminded patient of Transitional Care Clinic follow-up appointment on 11/25/15 at 1000 with Dr. Venetia Night. Patient verbalized understanding. No additional needs/concerns identified.

## 2015-11-12 NOTE — Progress Notes (Signed)
Patient cancelled appointment.   This encounter was created in error - please disregard. 

## 2015-11-13 ENCOUNTER — Encounter: Payer: Self-pay | Admitting: Physician Assistant

## 2015-11-17 ENCOUNTER — Ambulatory Visit (INDEPENDENT_AMBULATORY_CARE_PROVIDER_SITE_OTHER): Payer: Self-pay | Admitting: Nurse Practitioner

## 2015-11-17 ENCOUNTER — Encounter: Payer: Self-pay | Admitting: Nurse Practitioner

## 2015-11-17 VITALS — BP 110/80 | HR 84 | Ht 70.0 in | Wt 168.2 lb

## 2015-11-17 DIAGNOSIS — F17200 Nicotine dependence, unspecified, uncomplicated: Secondary | ICD-10-CM

## 2015-11-17 DIAGNOSIS — I255 Ischemic cardiomyopathy: Secondary | ICD-10-CM

## 2015-11-17 DIAGNOSIS — Z720 Tobacco use: Secondary | ICD-10-CM

## 2015-11-17 DIAGNOSIS — I10 Essential (primary) hypertension: Secondary | ICD-10-CM

## 2015-11-17 LAB — BASIC METABOLIC PANEL
BUN: 25 mg/dL (ref 7–25)
CO2: 22 mmol/L (ref 20–31)
Calcium: 8.8 mg/dL (ref 8.6–10.3)
Chloride: 108 mmol/L (ref 98–110)
Creat: 1.21 mg/dL (ref 0.70–1.33)
Glucose, Bld: 157 mg/dL — ABNORMAL HIGH (ref 65–99)
Potassium: 4.4 mmol/L (ref 3.5–5.3)
Sodium: 141 mmol/L (ref 135–146)

## 2015-11-17 LAB — CBC
HCT: 37.4 % — ABNORMAL LOW (ref 39.0–52.0)
Hemoglobin: 12.1 g/dL — ABNORMAL LOW (ref 13.0–17.0)
MCH: 28 pg (ref 26.0–34.0)
MCHC: 32.4 g/dL (ref 30.0–36.0)
MCV: 86.6 fL (ref 78.0–100.0)
MPV: 10.6 fL (ref 8.6–12.4)
Platelets: 257 10*3/uL (ref 150–400)
RBC: 4.32 MIL/uL (ref 4.22–5.81)
RDW: 13.8 % (ref 11.5–15.5)
WBC: 6.6 10*3/uL (ref 4.0–10.5)

## 2015-11-17 NOTE — Progress Notes (Signed)
CARDIOLOGY OFFICE NOTE  Date:  11/17/2015    Sean Hinton Date of Birth: [birth year] Medical Record #[id number]  PCP:  Lora Paula, MD  Cardiologist:  Croitoru    Chief Complaint  Patient presents with  . Cardiomyopathy  . Coronary Artery Disease    Post hospital visit - seen for Dr. Royann Shivers    History of Present Illness: Sean Hinton is a 55 y.o. male who presents today for a post hospital visit. Seen for Dr. Royann Shivers. Was to have been TOC but he has already been seen by PCP.  He has a history of CAD, systolic CHF, polysubstance abuse, paroxysmal AF and diabetes mellitus.   He presented earlier this month to the ER because of chest pain and shortness of breath.  Since his symptoms did not get better he came to the ER. Troponin in the ER was elevated. EKG showed nonspecific changes. Chest x-ray showed infiltrates concerning for pneumonia. BNP was mildly elevated. Patient was initially placed on BiPAP for 2 hours and patient's breathing improved.  Urine drug screen was positive for cocaine.   He had a lateral NSTEMI due to thrombotic ramus intermdius occlusion - had DES stent placement. He was placed on ASA+Plavix+Xarelto for 30 days, then to stop ASA and continue Plavix and Xarelto for the next 12 months. Will need echo in 3 months for possible ICD. Switched to [employer]. ACE 1 started. His CCB therapy was stopped. Felt to need sleep study as well.   Comes back today. Here with his girlfriend. I see him mother. He says he is doing ok. Some mild DOE. Weight is up but he feels like it is stable. No swelling. Still smoking a few cigarettes. Says he is not using cocaine. No chest pain. Tolerating his medicines. No bleeding noted. Sugars are a little erratic. Not dizzy or lightheaded. No syncope.   Past Medical History  Diagnosis Date  . Diabetes mellitus with complication (HCC)   . Hypertension   . Syncope     a. 02/2015 - felt 2/2 rapid AF/AFL.  Marland Kitchen Atrial fibrillation and  flutter (HCC)     a. Dx 02/2015 - placed on Xarelto; b. 09/2015 recurrence in setting of PNA.  . Ischemic cardiomyopathy     a. 09/2015 Echo: EF 35-40%.  Marland Kitchen CAD (coronary artery disease)     a. Dx 02/2015 - 2V CAD with CTO LAD, diffuse dz in PDA, OM - med rx.  . Hypertensive heart disease   . Hyperlipidemia   . Mitral regurgitation     a. Mod by echo 02/2015,  . Tobacco abuse   . Chronic systolic CHF (congestive heart failure) (HCC)     a. 09/2015 Echo: EF 35-40%, sev apical/antlat, apical HK, mild MR, mildly dil LA.  Marland Kitchen ARF (acute respiratory failure) (HCC)   . Pneumonia 09/2015  . GERD (gastroesophageal reflux disease)   . Headache   . Arthritis     ra  . NSTEMI (non-ST elevated myocardial infarction) (HCC) 10/2015    Past Surgical History  Procedure Laterality Date  . Right thumb surgery     . Cardiac catheterization N/A 02/24/2015    Procedure: Left Heart Cath and Coronary Angiography;  Surgeon: Marykay Lex, MD;  Location: [facility] INVASIVE CV LAB CUPID;  Service: Cardiovascular;  Laterality: N/A;  . Cardiac catheterization  02/24/2015    Procedure: Coronary/Bypass Graft CTO Intervention;  Surgeon: Marykay Lex, MD;  Location: [facility] INVASIVE CV LAB CUPID;  Service: Cardiovascular;;  .  Cardiac catheterization N/A 11/03/2015    Procedure: Left Heart Cath and Coronary Angiography;  Surgeon: Tonny Bollman, MD;  Location: [facility] INVASIVE CV LAB;  Service: Cardiovascular;  Laterality: N/A;  . Cardiac catheterization N/A 11/03/2015    Procedure: Coronary Stent Intervention;  Surgeon: Tonny Bollman, MD;  Location: [facility] INVASIVE CV LAB;  Service: Cardiovascular;  Laterality: N/A;  RAMUS     Medications: Current Outpatient Prescriptions  Medication Sig Dispense Refill  . albuterol (PROVENTIL HFA;VENTOLIN HFA) 108 (90 BASE) MCG/ACT inhaler Inhale 2 puffs into the lungs every 6 (six) hours as needed for wheezing or shortness of breath. 1 Inhaler 2  . aspirin 81 MG chewable tablet Chew 1 tablet (81 mg  total) by mouth daily. 30 tablet 0  . atorvastatin (LIPITOR) 80 MG tablet Take 1 tablet (80 mg total) by mouth every evening. 30 tablet 3  . Blood Glucose Monitoring Suppl (TRUE METRIX METER) DEVI 1 Device by Does not apply route 3 (three) times daily. 1 Device 0  . carvedilol (COREG) 12.5 MG tablet Take 1 tablet (12.5 mg total) by mouth 2 (two) times daily with a meal. 60 tablet 0  . clopidogrel (PLAVIX) 75 MG tablet Take 1 tablet (75 mg total) by mouth daily with breakfast. 30 tablet 0  . Dexlansoprazole (DEXILANT) 30 MG capsule Take 1 capsule (30 mg total) by mouth daily. 90 capsule 3  . digoxin (LANOXIN) 0.125 MG tablet Take 1 tablet (0.125 mg total) by mouth daily. 30 tablet 3  . furosemide (LASIX) 40 MG tablet Take 1 tablet (40 mg total) by mouth daily. 30 tablet 3  . glipiZIDE (GLUCOTROL) 10 MG tablet Take 1 tablet (10 mg total) by mouth 2 (two) times daily before a meal. 60 tablet 0  . glucose blood (TRUE METRIX BLOOD GLUCOSE TEST) test strip Use as instructed 100 each 12  . hydrALAZINE (APRESOLINE) 50 MG tablet Take 1 tablet (50 mg total) by mouth 3 (three) times daily. 90 tablet 0  . ipratropium-albuterol (DUONEB) 0.5-2.5 (3) MG/3ML SOLN Take 3 mLs by nebulization 2 (two) times daily. 360 mL 10  . isosorbide mononitrate (IMDUR) 30 MG 24 hr tablet Take 1 tablet (30 mg total) by mouth daily. 30 tablet 2  . lisinopril (PRINIVIL,ZESTRIL) 20 MG tablet Take 1 tablet (20 mg total) by mouth daily. 30 tablet 0  . nitroGLYCERIN (NITROSTAT) 0.4 MG SL tablet Place 1 tablet (0.4 mg total) under the tongue every 5 (five) minutes as needed for chest pain (up to 3 doses). 25 tablet 3  . potassium chloride SA (K-DUR,KLOR-CON) 20 MEQ tablet Take 1 tablet (20 mEq total) by mouth daily. 30 tablet 3  . rivaroxaban (XARELTO) 20 MG TABS tablet Take 1 tablet (20 mg total) by mouth daily with supper. 30 tablet 3  . spironolactone (ALDACTONE) 25 MG tablet Take 1 tablet (25 mg total) by mouth daily. 30 tablet 3  .  traMADol (ULTRAM) 50 MG tablet Take 1 tablet (50 mg total) by mouth every 6 (six) hours as needed for severe pain. 10 tablet 0  . TRUEPLUS LANCETS 28G MISC 1 each by Does not apply route 3 (three) times daily. 100 each 12   No current facility-administered medications for this visit.    Allergies: No Known Allergies  Social History: The patient  reports that he has been smoking Cigarettes.  He has a 7.5 pack-year smoking history. He has never used smokeless tobacco. He reports that he uses illicit drugs (Cocaine). He reports that he does not  drink alcohol.   Family History: The patient's family history includes Cancer in his mother and sister; Diabetes in his brother, father, mother, and sister; Heart attack in his brother, father, and mother; Heart disease in his brother, father, and mother; Hypertension in his brother, father, mother, and sister; Stroke in his brother.   Review of Systems: Please see the history of present illness.   Otherwise, the review of systems is positive for mild DOE.   All other systems are reviewed and negative.   Physical Exam: VS:  BP 110/80 mmHg  Pulse 84  Ht 5\' 10"  (1.778 m)  Wt 168 lb 3.2 oz (76.295 kg)  BMI 24.13 kg/m2 .  BMI Body mass index is 24.13 kg/(m^2).  Wt Readings from Last 3 Encounters:  11/17/15 168 lb 3.2 oz (76.295 kg)  11/11/15 163 lb (73.936 kg)  11/06/15 152 lb 12.5 oz (69.3 kg)    General: Pleasant. He is alert. He smells of tobacco. He is in no acute distress.  HEENT: Normal. Neck: Supple, no JVD, carotid bruits, or masses noted.  Cardiac: Regular rate and rhythm. Probable gallop noted on exam. No edema.  Respiratory:  Lungs are clear to auscultation bilaterally with normal work of breathing.  GI: Soft and nontender.  MS: No deformity or atrophy. Gait and ROM intact. Skin: Warm and dry. Color is normal.  Neuro:  Strength and sensation are intact and no gross focal deficits noted.  Psych: Alert, appropriate and with normal  affect.   LABORATORY DATA:  EKG:  EKG is not ordered today.  Lab Results  Component Value Date   WBC 9.3 11/06/2015   HGB 12.9* 11/06/2015   HCT 39.0 11/06/2015   PLT 156 11/06/2015   GLUCOSE 179* 11/05/2015   CHOL 128 10/13/2015   TRIG 67 10/13/2015   HDL 35* 10/13/2015   LDLCALC 80 10/13/2015   Hinton 42 11/03/2015   AST 101* 11/03/2015   NA 136 11/05/2015   K 3.9 11/05/2015   CL 100* 11/05/2015   CREATININE 1.08 11/05/2015   BUN 17 11/05/2015   CO2 25 11/05/2015   TSH 0.447 11/03/2015   INR 1.36 11/03/2015   HGBA1C 9.6* 10/13/2015    BNP (last 3 results)  Recent Labs  10/14/15 0830 10/15/15 0350 11/03/15 0004  BNP 1219.9* 606.7* 571.9*    ProBNP (last 3 results) No results for input(s): PROBNP in the last 8760 hours.   Other Studies Reviewed Today: Cardiac Cath Conclusion     Dist RCA lesion, 50% stenosed.  RPDA lesion, 80% stenosed.  1st RPLB lesion, 70% stenosed.  Mid RCA lesion, 50% stenosed.  Prox LAD lesion, 100% stenosed.  Ramus lesion, 100% stenosed. Post intervention, there is a 0% residual stenosis.  1. Severe 3 vessel CAD with total occlusion of the LAD (chronic), total thrombotic occlusion of the ramus intermedius (acute), and severe diffuse distal vessel disease in the RCA.  2. Severely elevated LVEDP with acute pulmonary edema, improved during the course of the procedure with IV furosemide and NTG and coronary reperfusion  Plan - admit to CCU. Consider ASA, clopidogrel, and Xarelto (paroxysmal atrial fibrillation) x 3 months, then stop ASA.    Echo Study Conclusions from 10/2015  - Left ventricle: The cavity size was normal. Wall thickness was increased in a pattern of moderate LVH. Systolic function was moderately to severely reduced. The estimated ejection fraction was in the range of 30% to 35%. Anterolateral and inferolateral severe hypokinesis. Apical septal, apical inferior, apical anterior, and  apical akinesis.  Peri-apical thinning. The inferolateral and the lateral papillary muscle appear hyperechoic and edematous, without thinning, suggestive of recent ischemia/infarction. Doppler parameters are consistent with abnormal left ventricular relaxation (grade 1 diastolic dysfunction). No evidence of thrombus. - Aortic valve: Structurally normal valve. Trileaflet. Transvalvular velocity was within the normal range. There was no stenosis. - Mitral valve: There was trivial regurgitation. - Left atrium: The atrium was moderately dilated. - Right ventricle: The cavity size was normal. Systolic function was normal. - Tricuspid valve: Peak RV-RA gradient (S): 32 mm Hg. - Pulmonary arteries: PA peak pressure: 35 mm Hg (S). - Inferior vena cava: The vessel was normal in size. The respirophasic diameter changes were in the normal range (= 50%), consistent with normal central venous pressure.  Impressions:  - Normal LV size with moderate LV hypertrophy. EF 30-35% with wall motion abnormalities as noted above. Normal RV size and systolic function. No significant valvular abnormalities. Compared to the previous study, LV systolic function is worse due to a new inferolateral wall motion abnormality.   Assessment/Plan: 1. NSTEMI with PCI with DES - doing well clinically. On triple therapy with ASA+Plavix+Xarelto for next 30 days, then stopping aspirin.  2. Ischemic CM - EF of 30% - needs echo in 3 months - already orderly. BP looks good on his current regimen. Would favor trying to titrate either Coreg or ACE on return. We discussed possible ICD but again, he was told that he would need to be off cocaine. To consider Entresto in the future. Recheck his labs today.   3. Chronic systolic and diastolic HF - weight is up but he really does not look like he has volume overload. Will recheck labs today. He will continue to weigh, avoid salt, etc. But he did eat at [employer] last  night :(  4. Polysubstance abuse - discussed at length the need for total abstinence.   5. PAF/flutter - on Xarelto and beta blocker.   6. HTN - BP good on current regimen.   7. OSA - will arrange sleep study - message sent to the scheduler.  Current medicines are reviewed with the patient today.  The patient does not have concerns regarding medicines other than what has been noted above.  The following changes have been made:  See above.  Labs/ tests ordered today include:    Orders Placed This Encounter  Procedures  . Brain natriuretic peptide  . Basic metabolic panel  . CBC     Disposition:   FU with   Patient is agreeable to this plan and will call if any problems develop in the interim.   Signed: Rosalio Macadamia, RN, ANP-C 11/17/2015 11:02 AM  [facility] [address] [place], [place]  [address] Phone: [phone] Fax: [phone]

## 2015-11-17 NOTE — Patient Instructions (Addendum)
We will be checking the following labs today - BNP, BMET and CBC   Medication Instructions:    Continue with your current medicines.     Testing/Procedures To Be Arranged:  N/A  Follow-Up:   See Dr. Royann Shivers as planned    Other Special Instructions:   Will send the scheduler a message about getting a sleep study arranged  Think about what we talked about today  Daily weights  Restrict salt  Extra dose of Lasix if gain 2 or more pounds overnight.     If you need a refill on your cardiac medications before your next appointment, please call your pharmacy.   Call the [facility] office at [phone] if you have any questions, problems or concerns.

## 2015-11-18 ENCOUNTER — Other Ambulatory Visit: Payer: Self-pay | Admitting: *Deleted

## 2015-11-18 DIAGNOSIS — G4733 Obstructive sleep apnea (adult) (pediatric): Secondary | ICD-10-CM

## 2015-11-18 LAB — BRAIN NATRIURETIC PEPTIDE: Brain Natriuretic Peptide: 693.2 pg/mL — ABNORMAL HIGH (ref 0.0–100.0)

## 2015-11-19 ENCOUNTER — Encounter (HOSPITAL_COMMUNITY): Payer: Self-pay | Admitting: Emergency Medicine

## 2015-11-19 ENCOUNTER — Inpatient Hospital Stay (HOSPITAL_COMMUNITY)
Admission: EM | Admit: 2015-11-19 | Discharge: 2015-11-23 | DRG: 166 | Disposition: A | Discharge status: Home / Self Care | Payer: Self-pay | Attending: Internal Medicine | Admitting: Internal Medicine

## 2015-11-19 ENCOUNTER — Telehealth: Payer: Self-pay | Admitting: Nurse Practitioner

## 2015-11-19 ENCOUNTER — Inpatient Hospital Stay (HOSPITAL_COMMUNITY): Payer: MEDICAID

## 2015-11-19 ENCOUNTER — Emergency Department (HOSPITAL_COMMUNITY): Payer: Self-pay

## 2015-11-19 ENCOUNTER — Inpatient Hospital Stay (HOSPITAL_COMMUNITY): Payer: Self-pay

## 2015-11-19 DIAGNOSIS — I272 Other secondary pulmonary hypertension: Secondary | ICD-10-CM | POA: Diagnosis present

## 2015-11-19 DIAGNOSIS — K0889 Other specified disorders of teeth and supporting structures: Secondary | ICD-10-CM | POA: Diagnosis present

## 2015-11-19 DIAGNOSIS — I509 Heart failure, unspecified: Secondary | ICD-10-CM

## 2015-11-19 DIAGNOSIS — K5900 Constipation, unspecified: Secondary | ICD-10-CM | POA: Diagnosis present

## 2015-11-19 DIAGNOSIS — J189 Pneumonia, unspecified organism: Secondary | ICD-10-CM | POA: Diagnosis present

## 2015-11-19 DIAGNOSIS — J690 Pneumonitis due to inhalation of food and vomit: Secondary | ICD-10-CM | POA: Diagnosis present

## 2015-11-19 DIAGNOSIS — R51 Headache: Secondary | ICD-10-CM

## 2015-11-19 DIAGNOSIS — R042 Hemoptysis: Secondary | ICD-10-CM

## 2015-11-19 DIAGNOSIS — R519 Headache, unspecified: Secondary | ICD-10-CM

## 2015-11-19 DIAGNOSIS — I5023 Acute on chronic systolic (congestive) heart failure: Secondary | ICD-10-CM | POA: Diagnosis present

## 2015-11-19 DIAGNOSIS — J9601 Acute respiratory failure with hypoxia: Secondary | ICD-10-CM

## 2015-11-19 DIAGNOSIS — Y95 Nosocomial condition: Secondary | ICD-10-CM | POA: Diagnosis present

## 2015-11-19 DIAGNOSIS — Z4659 Encounter for fitting and adjustment of other gastrointestinal appliance and device: Secondary | ICD-10-CM | POA: Insufficient documentation

## 2015-11-19 DIAGNOSIS — I340 Nonrheumatic mitral (valve) insufficiency: Secondary | ICD-10-CM | POA: Diagnosis present

## 2015-11-19 DIAGNOSIS — K219 Gastro-esophageal reflux disease without esophagitis: Secondary | ICD-10-CM | POA: Diagnosis present

## 2015-11-19 DIAGNOSIS — E1165 Type 2 diabetes mellitus with hyperglycemia: Secondary | ICD-10-CM | POA: Diagnosis present

## 2015-11-19 DIAGNOSIS — Z7982 Long term (current) use of aspirin: Secondary | ICD-10-CM

## 2015-11-19 DIAGNOSIS — I214 Non-ST elevation (NSTEMI) myocardial infarction: Secondary | ICD-10-CM | POA: Diagnosis present

## 2015-11-19 DIAGNOSIS — D649 Anemia, unspecified: Secondary | ICD-10-CM | POA: Diagnosis present

## 2015-11-19 DIAGNOSIS — N179 Acute kidney failure, unspecified: Secondary | ICD-10-CM | POA: Diagnosis present

## 2015-11-19 DIAGNOSIS — H5710 Ocular pain, unspecified eye: Secondary | ICD-10-CM | POA: Diagnosis present

## 2015-11-19 DIAGNOSIS — I252 Old myocardial infarction: Secondary | ICD-10-CM

## 2015-11-19 DIAGNOSIS — H540 Blindness, both eyes: Secondary | ICD-10-CM | POA: Diagnosis present

## 2015-11-19 DIAGNOSIS — J9602 Acute respiratory failure with hypercapnia: Principal | ICD-10-CM | POA: Diagnosis present

## 2015-11-19 DIAGNOSIS — I2582 Chronic total occlusion of coronary artery: Secondary | ICD-10-CM | POA: Diagnosis present

## 2015-11-19 DIAGNOSIS — F419 Anxiety disorder, unspecified: Secondary | ICD-10-CM | POA: Diagnosis present

## 2015-11-19 DIAGNOSIS — I2510 Atherosclerotic heart disease of native coronary artery without angina pectoris: Secondary | ICD-10-CM | POA: Diagnosis present

## 2015-11-19 DIAGNOSIS — R079 Chest pain, unspecified: Secondary | ICD-10-CM

## 2015-11-19 DIAGNOSIS — I110 Hypertensive heart disease with heart failure: Secondary | ICD-10-CM | POA: Diagnosis present

## 2015-11-19 DIAGNOSIS — J811 Chronic pulmonary edema: Secondary | ICD-10-CM

## 2015-11-19 DIAGNOSIS — I480 Paroxysmal atrial fibrillation: Secondary | ICD-10-CM | POA: Diagnosis present

## 2015-11-19 DIAGNOSIS — N189 Chronic kidney disease, unspecified: Secondary | ICD-10-CM | POA: Diagnosis present

## 2015-11-19 DIAGNOSIS — I10 Essential (primary) hypertension: Secondary | ICD-10-CM | POA: Diagnosis present

## 2015-11-19 DIAGNOSIS — I4892 Unspecified atrial flutter: Secondary | ICD-10-CM | POA: Diagnosis present

## 2015-11-19 DIAGNOSIS — E876 Hypokalemia: Secondary | ICD-10-CM | POA: Diagnosis present

## 2015-11-19 DIAGNOSIS — E1159 Type 2 diabetes mellitus with other circulatory complications: Secondary | ICD-10-CM | POA: Diagnosis present

## 2015-11-19 DIAGNOSIS — J9600 Acute respiratory failure, unspecified whether with hypoxia or hypercapnia: Secondary | ICD-10-CM

## 2015-11-19 DIAGNOSIS — Z7902 Long term (current) use of antithrombotics/antiplatelets: Secondary | ICD-10-CM

## 2015-11-19 DIAGNOSIS — Z955 Presence of coronary angioplasty implant and graft: Secondary | ICD-10-CM

## 2015-11-19 DIAGNOSIS — I255 Ischemic cardiomyopathy: Secondary | ICD-10-CM | POA: Diagnosis present

## 2015-11-19 DIAGNOSIS — F1410 Cocaine abuse, uncomplicated: Secondary | ICD-10-CM | POA: Diagnosis present

## 2015-11-19 DIAGNOSIS — E785 Hyperlipidemia, unspecified: Secondary | ICD-10-CM | POA: Diagnosis present

## 2015-11-19 DIAGNOSIS — F17210 Nicotine dependence, cigarettes, uncomplicated: Secondary | ICD-10-CM | POA: Diagnosis present

## 2015-11-19 LAB — I-STAT ARTERIAL BLOOD GAS, ED
Acid-Base Excess: 3 mmol/L — ABNORMAL HIGH (ref 0.0–2.0)
Bicarbonate: 25.6 mEq/L — ABNORMAL HIGH (ref 20.0–24.0)
Bicarbonate: 28.9 mEq/L — ABNORMAL HIGH (ref 20.0–24.0)
O2 SAT: 100 %
O2 SAT: 100 %
PCO2 ART: 46 mmHg — AB (ref 35.0–45.0)
PCO2 ART: 47.3 mmHg — AB (ref 35.0–45.0)
PH ART: 7.395 (ref 7.350–7.450)
PO2 ART: 324 mmHg — AB (ref 80.0–100.0)
TCO2: 27 mmol/L (ref 0–100)
TCO2: 30 mmol/L (ref 0–100)
pH, Arterial: 7.354 (ref 7.350–7.450)
pO2, Arterial: 206 mmHg — ABNORMAL HIGH (ref 80.0–100.0)

## 2015-11-19 LAB — GLUCOSE, CAPILLARY
GLUCOSE-CAPILLARY: 269 mg/dL — AB (ref 65–99)
GLUCOSE-CAPILLARY: 257 mg/dL — AB (ref 65–99)
Glucose-Capillary: 324 mg/dL — ABNORMAL HIGH (ref 65–99)

## 2015-11-19 LAB — CBC WITH DIFFERENTIAL/PLATELET
Basophils Absolute: 0 10*3/uL (ref 0.0–0.1)
Basophils Relative: 0 %
EOS ABS: 0 10*3/uL (ref 0.0–0.7)
EOS PCT: 0 %
HCT: 40.1 % (ref 39.0–52.0)
Hemoglobin: 13.7 g/dL (ref 13.0–17.0)
LYMPHS ABS: 1 10*3/uL (ref 0.7–4.0)
Lymphocytes Relative: 6 %
MCH: 29 pg (ref 26.0–34.0)
MCHC: 34.2 g/dL (ref 30.0–36.0)
MCV: 85 fL (ref 78.0–100.0)
MONOS PCT: 6 %
Monocytes Absolute: 0.9 10*3/uL (ref 0.1–1.0)
Neutro Abs: 15.1 10*3/uL — ABNORMAL HIGH (ref 1.7–7.7)
Neutrophils Relative %: 88 %
PLATELETS: 222 10*3/uL (ref 150–400)
RBC: 4.72 MIL/uL (ref 4.22–5.81)
RDW: 13.9 % (ref 11.5–15.5)
WBC: 17.1 10*3/uL — AB (ref 4.0–10.5)

## 2015-11-19 LAB — COMPREHENSIVE METABOLIC PANEL
ALK PHOS: 102 U/L (ref 38–126)
ALT: 23 U/L (ref 17–63)
AST: 21 U/L (ref 15–41)
Albumin: 2.8 g/dL — ABNORMAL LOW (ref 3.5–5.0)
Anion gap: 13 (ref 5–15)
BUN: 22 mg/dL — ABNORMAL HIGH (ref 6–20)
CALCIUM: 9.1 mg/dL (ref 8.9–10.3)
CO2: 23 mmol/L (ref 22–32)
CREATININE: 1.26 mg/dL — AB (ref 0.61–1.24)
Chloride: 104 mmol/L (ref 101–111)
GFR calc non Af Amer: >60 mL/min (ref >60)
Glucose, Bld: 260 mg/dL — ABNORMAL HIGH (ref 65–99)
Potassium: 3.6 mmol/L (ref 3.5–5.1)
SODIUM: 140 mmol/L (ref 135–145)
Total Bilirubin: 0.7 mg/dL (ref 0.3–1.2)
Total Protein: 6.8 g/dL (ref 6.5–8.1)

## 2015-11-19 LAB — CBG MONITORING, ED: GLUCOSE-CAPILLARY: 346 mg/dL — AB (ref 65–99)

## 2015-11-19 LAB — RAPID URINE DRUG SCREEN, HOSP PERFORMED
Amphetamines: NOT DETECTED
BARBITURATES: POSITIVE — AB
Benzodiazepines: NOT DETECTED
Cocaine: POSITIVE — AB
Opiates: NOT DETECTED
Tetrahydrocannabinol: NOT DETECTED

## 2015-11-19 LAB — TROPONIN I
TROPONIN I: 0.41 ng/mL — AB (ref <0.031)
TROPONIN I: 0.41 ng/mL — AB (ref <0.031)
Troponin I: 0.46 ng/mL — ABNORMAL HIGH (ref <0.031)
Troponin I: 0.43 ng/mL — ABNORMAL HIGH (ref <0.031)

## 2015-11-19 LAB — PROCALCITONIN
Procalcitonin: 0.5 ng/mL
Procalcitonin: 0.89 ng/mL

## 2015-11-19 LAB — BRAIN NATRIURETIC PEPTIDE: B NATRIURETIC PEPTIDE 5: 1227.4 pg/mL — AB (ref 0.0–100.0)

## 2015-11-19 LAB — DIGOXIN LEVEL: Digoxin Level: <0.2 ng/mL — ABNORMAL LOW (ref 0.8–2.0)

## 2015-11-19 LAB — MRSA PCR SCREENING: MRSA by PCR: NEGATIVE

## 2015-11-19 LAB — STREP PNEUMONIAE URINARY ANTIGEN: STREP PNEUMO URINARY ANTIGEN: NEGATIVE

## 2015-11-19 LAB — TRIGLYCERIDES: TRIGLYCERIDES: 69 mg/dL (ref <150)

## 2015-11-19 MED ORDER — LISINOPRIL 20 MG PO TABS: 20.0000 mg | ORAL_TABLET | Freq: Every day | ORAL | Status: DC

## 2015-11-19 MED ORDER — VANCOMYCIN HCL IN DEXTROSE 1-5 GM/200ML-% IV SOLN
1000.0000 mg | Freq: Two times a day (BID) | INTRAVENOUS | Status: DC
  Administered 2015-11-19: 1000 mg via INTRAVENOUS
  Filled 2015-11-19 (×3): qty 200

## 2015-11-19 MED ORDER — POTASSIUM CHLORIDE 20 MEQ/15ML (10%) PO SOLN
40.0000 meq | Freq: Two times a day (BID) | ORAL | Status: DC
  Administered 2015-11-19: 40 meq
  Filled 2015-11-19: qty 30

## 2015-11-19 MED ORDER — ANTISEPTIC ORAL RINSE SOLUTION (CORINZ)
7.0000 mL | Freq: Four times a day (QID) | OROMUCOSAL | Status: DC
  Administered 2015-11-19 – 2015-11-20 (×2): 7 mL via OROMUCOSAL

## 2015-11-19 MED ORDER — ACETAMINOPHEN 325 MG PO TABS
650.0000 mg | ORAL_TABLET | Freq: Four times a day (QID) | ORAL | Status: DC | PRN
  Administered 2015-11-20 – 2015-11-21 (×2): 650 mg via ORAL
  Filled 2015-11-19 (×2): qty 2

## 2015-11-19 MED ORDER — ALPRAZOLAM 0.25 MG PO TABS
0.2500 mg | ORAL_TABLET | Freq: Two times a day (BID) | ORAL | Status: DC | PRN
  Filled 2015-11-19: qty 1

## 2015-11-19 MED ORDER — FENTANYL CITRATE (PF) 100 MCG/2ML IJ SOLN
INTRAMUSCULAR | Status: AC
  Filled 2015-11-19: qty 2

## 2015-11-19 MED ORDER — FENTANYL CITRATE (PF) 100 MCG/2ML IJ SOLN
INTRAMUSCULAR | Status: DC | PRN
  Administered 2015-11-19: 100 ug via INTRAVENOUS

## 2015-11-19 MED ORDER — TRAMADOL HCL 50 MG PO TABS: 50.0000 mg | ORAL_TABLET | Freq: Four times a day (QID) | ORAL | Status: DC | PRN

## 2015-11-19 MED ORDER — FENTANYL CITRATE (PF) 100 MCG/2ML IJ SOLN
100.0000 ug | INTRAMUSCULAR | Status: DC | PRN
  Administered 2015-11-19: 100 ug via INTRAVENOUS

## 2015-11-19 MED ORDER — CHLORHEXIDINE GLUCONATE 0.12% ORAL RINSE (MEDLINE KIT)
15.0000 mL | Freq: Two times a day (BID) | OROMUCOSAL | Status: DC
  Administered 2015-11-19: 15 mL via OROMUCOSAL

## 2015-11-19 MED ORDER — VITAL HIGH PROTEIN PO LIQD: 1000.0000 mL | ORAL | Status: DC

## 2015-11-19 MED ORDER — INSULIN ASPART 100 UNIT/ML ~~LOC~~ SOLN: 0.0000 [IU] | Freq: Every day | SUBCUTANEOUS | Status: DC

## 2015-11-19 MED ORDER — PROPOFOL 10 MG/ML IV BOLUS
INTRAVENOUS | Status: DC | PRN
  Administered 2015-11-19: 80 mg via INTRAVENOUS
  Administered 2015-11-19: 50 mg via INTRAVENOUS

## 2015-11-19 MED ORDER — ALBUTEROL SULFATE (2.5 MG/3ML) 0.083% IN NEBU
5.0000 mg | INHALATION_SOLUTION | Freq: Once | RESPIRATORY_TRACT | Status: AC
  Administered 2015-11-19: 5 mg via RESPIRATORY_TRACT
  Filled 2015-11-19: qty 6

## 2015-11-19 MED ORDER — PANTOPRAZOLE SODIUM 40 MG IV SOLR
40.0000 mg | INTRAVENOUS | Status: DC
  Administered 2015-11-20: 40 mg via INTRAVENOUS
  Filled 2015-11-19: qty 40

## 2015-11-19 MED ORDER — ONDANSETRON HCL 4 MG PO TABS: 4.0000 mg | ORAL_TABLET | Freq: Four times a day (QID) | ORAL | Status: DC | PRN

## 2015-11-19 MED ORDER — BISACODYL 10 MG RE SUPP: 10.0000 mg | Freq: Every day | RECTAL | Status: DC | PRN

## 2015-11-19 MED ORDER — FUROSEMIDE 10 MG/ML IJ SOLN
60.0000 mg | Freq: Three times a day (TID) | INTRAMUSCULAR | Status: DC
  Administered 2015-11-19 – 2015-11-20 (×2): 60 mg via INTRAVENOUS
  Filled 2015-11-19 (×2): qty 6

## 2015-11-19 MED ORDER — CHLORHEXIDINE GLUCONATE 0.12% ORAL RINSE (MEDLINE KIT)
15.0000 mL | Freq: Two times a day (BID) | OROMUCOSAL | Status: DC
  Administered 2015-11-20 – 2015-11-23 (×3): 15 mL via OROMUCOSAL

## 2015-11-19 MED ORDER — ASPIRIN 81 MG PO CHEW
81.0000 mg | CHEWABLE_TABLET | Freq: Every day | ORAL | Status: DC
  Administered 2015-11-20 – 2015-11-23 (×5): 81 mg via ORAL
  Filled 2015-11-19 (×4): qty 1

## 2015-11-19 MED ORDER — HYDRALAZINE HCL 50 MG PO TABS
50.0000 mg | ORAL_TABLET | Freq: Three times a day (TID) | ORAL | Status: DC
  Administered 2015-11-19 – 2015-11-20 (×5): 50 mg via ORAL
  Filled 2015-11-19 (×5): qty 1

## 2015-11-19 MED ORDER — POTASSIUM CHLORIDE CRYS ER 20 MEQ PO TBCR: 20.0000 meq | EXTENDED_RELEASE_TABLET | Freq: Every day | ORAL | Status: DC

## 2015-11-19 MED ORDER — NITROGLYCERIN IN D5W 200-5 MCG/ML-% IV SOLN
0.0000 ug/min | INTRAVENOUS | Status: DC
  Administered 2015-11-19: 5 ug/min via INTRAVENOUS
  Filled 2015-11-19: qty 250

## 2015-11-19 MED ORDER — SODIUM CHLORIDE 0.9 % IV SOLN
INTRAVENOUS | Status: DC
  Administered 2015-11-19: 2.1 [IU]/h via INTRAVENOUS
  Filled 2015-11-19: qty 2.5

## 2015-11-19 MED ORDER — ACETAMINOPHEN 650 MG RE SUPP: 650.0000 mg | Freq: Four times a day (QID) | RECTAL | Status: DC | PRN

## 2015-11-19 MED ORDER — ANTISEPTIC ORAL RINSE SOLUTION (CORINZ): 7.0000 mL | Freq: Four times a day (QID) | OROMUCOSAL | Status: DC

## 2015-11-19 MED ORDER — DEXTROSE 10 % IV SOLN: INTRAVENOUS | Status: DC | PRN

## 2015-11-19 MED ORDER — INSULIN ASPART 100 UNIT/ML ~~LOC~~ SOLN: 2.0000 [IU] | SUBCUTANEOUS | Status: DC

## 2015-11-19 MED ORDER — ATORVASTATIN CALCIUM 80 MG PO TABS
80.0000 mg | ORAL_TABLET | Freq: Every evening | ORAL | Status: DC
  Administered 2015-11-19 – 2015-11-23 (×5): 80 mg via ORAL
  Filled 2015-11-19 (×5): qty 1

## 2015-11-19 MED ORDER — MIDAZOLAM HCL 2 MG/2ML IJ SOLN
INTRAMUSCULAR | Status: AC
  Administered 2015-11-19: 2 mg
  Filled 2015-11-19: qty 2

## 2015-11-19 MED ORDER — MIDAZOLAM HCL 5 MG/5ML IJ SOLN
INTRAMUSCULAR | Status: DC | PRN
  Administered 2015-11-19: 2 mg via INTRAVENOUS

## 2015-11-19 MED ORDER — SODIUM CHLORIDE 0.9% FLUSH
3.0000 mL | Freq: Two times a day (BID) | INTRAVENOUS | Status: DC
  Administered 2015-11-19 – 2015-11-22 (×7): 3 mL via INTRAVENOUS

## 2015-11-19 MED ORDER — SODIUM CHLORIDE 0.9 % IV SOLN: 250.0000 mL | INTRAVENOUS | Status: DC | PRN

## 2015-11-19 MED ORDER — IPRATROPIUM-ALBUTEROL 0.5-2.5 (3) MG/3ML IN SOLN: 3.0000 mL | Freq: Two times a day (BID) | RESPIRATORY_TRACT | Status: DC

## 2015-11-19 MED ORDER — DIGOXIN 125 MCG PO TABS
0.1250 mg | ORAL_TABLET | Freq: Every day | ORAL | Status: DC
  Administered 2015-11-20: 0.125 mg via ORAL
  Filled 2015-11-19: qty 1

## 2015-11-19 MED ORDER — SODIUM CHLORIDE 0.9% FLUSH
3.0000 mL | Freq: Two times a day (BID) | INTRAVENOUS | Status: DC
  Administered 2015-11-19 – 2015-11-23 (×5): 3 mL via INTRAVENOUS

## 2015-11-19 MED ORDER — DEXTROSE 5 % IV SOLN
1.0000 g | Freq: Three times a day (TID) | INTRAVENOUS | Status: DC
  Administered 2015-11-19 – 2015-11-20 (×3): 1 g via INTRAVENOUS
  Filled 2015-11-19 (×6): qty 1

## 2015-11-19 MED ORDER — PROPOFOL 10 MG/ML IV BOLUS
INTRAVENOUS | Status: AC
  Filled 2015-11-19: qty 20

## 2015-11-19 MED ORDER — HEPARIN SODIUM (PORCINE) 5000 UNIT/ML IJ SOLN
5000.0000 [IU] | Freq: Three times a day (TID) | INTRAMUSCULAR | Status: DC
  Administered 2015-11-19 – 2015-11-23 (×11): 5000 [IU] via SUBCUTANEOUS
  Filled 2015-11-19 (×11): qty 1

## 2015-11-19 MED ORDER — PANTOPRAZOLE SODIUM 40 MG PO TBEC: 40.0000 mg | DELAYED_RELEASE_TABLET | Freq: Every day | ORAL | Status: DC

## 2015-11-19 MED ORDER — SPIRONOLACTONE 25 MG PO TABS
25.0000 mg | ORAL_TABLET | Freq: Every day | ORAL | Status: DC
  Administered 2015-11-19: 25 mg via ORAL
  Filled 2015-11-19: qty 1

## 2015-11-19 MED ORDER — SODIUM CHLORIDE 0.9 % IV SOLN
1500.0000 mg | Freq: Once | INTRAVENOUS | Status: AC
  Administered 2015-11-19: 1500 mg via INTRAVENOUS
  Filled 2015-11-19: qty 1500

## 2015-11-19 MED ORDER — PROPOFOL 1000 MG/100ML IV EMUL
0.0000 ug/kg/min | INTRAVENOUS | Status: DC
  Administered 2015-11-19 – 2015-11-20 (×4): 50 ug/kg/min via INTRAVENOUS
  Filled 2015-11-19 (×4): qty 100

## 2015-11-19 MED ORDER — ALBUTEROL SULFATE HFA 108 (90 BASE) MCG/ACT IN AERS: 2.0000 | INHALATION_SPRAY | Freq: Four times a day (QID) | RESPIRATORY_TRACT | Status: DC | PRN

## 2015-11-19 MED ORDER — ACETAMINOPHEN 325 MG PO TABS: 650.0000 mg | ORAL_TABLET | ORAL | Status: DC | PRN

## 2015-11-19 MED ORDER — CLOPIDOGREL BISULFATE 75 MG PO TABS
75.0000 mg | ORAL_TABLET | Freq: Every day | ORAL | Status: DC
  Administered 2015-11-20 – 2015-11-23 (×4): 75 mg via ORAL
  Filled 2015-11-19 (×4): qty 1

## 2015-11-19 MED ORDER — FUROSEMIDE 10 MG/ML IJ SOLN: 40.0000 mg | Freq: Once | INTRAMUSCULAR | Status: DC

## 2015-11-19 MED ORDER — SODIUM CHLORIDE 0.9% FLUSH: 3.0000 mL | INTRAVENOUS | Status: DC | PRN

## 2015-11-19 MED ORDER — FUROSEMIDE 10 MG/ML IJ SOLN
40.0000 mg | Freq: Two times a day (BID) | INTRAMUSCULAR | Status: DC
Start: 2015-11-19 — End: 2015-11-19
  Administered 2015-11-19: 40 mg via INTRAVENOUS
  Filled 2015-11-19 (×2): qty 4

## 2015-11-19 MED ORDER — ROCURONIUM BROMIDE 50 MG/5ML IV SOLN
INTRAVENOUS | Status: DC | PRN
  Administered 2015-11-19: 50 mg via INTRAVENOUS

## 2015-11-19 MED ORDER — ALBUTEROL SULFATE (2.5 MG/3ML) 0.083% IN NEBU
2.5000 mg | INHALATION_SOLUTION | RESPIRATORY_TRACT | Status: DC | PRN
  Administered 2015-11-22 – 2015-11-23 (×2): 2.5 mg via RESPIRATORY_TRACT
  Filled 2015-11-19 (×2): qty 3

## 2015-11-19 MED ORDER — GI COCKTAIL ~~LOC~~: 30.0000 mL | Freq: Four times a day (QID) | ORAL | Status: DC | PRN

## 2015-11-19 MED ORDER — PIPERACILLIN-TAZOBACTAM 3.375 G IVPB 30 MIN
3.3750 g | Freq: Once | INTRAVENOUS | Status: AC
  Administered 2015-11-19: 3.375 g via INTRAVENOUS
  Filled 2015-11-19: qty 50

## 2015-11-19 MED ORDER — INSULIN ASPART 100 UNIT/ML ~~LOC~~ SOLN: 0.0000 [IU] | Freq: Three times a day (TID) | SUBCUTANEOUS | Status: DC

## 2015-11-19 MED ORDER — ISOSORBIDE MONONITRATE ER 30 MG PO TB24
30.0000 mg | ORAL_TABLET | Freq: Every day | ORAL | Status: DC
  Administered 2015-11-19: 30 mg via ORAL
  Filled 2015-11-19: qty 1

## 2015-11-19 MED ORDER — FENTANYL CITRATE (PF) 100 MCG/2ML IJ SOLN
100.0000 ug | INTRAMUSCULAR | Status: DC | PRN
  Filled 2015-11-19: qty 2

## 2015-11-19 MED ORDER — ROCURONIUM BROMIDE 50 MG/5ML IV SOLN: 50.0000 mg | Freq: Once | INTRAVENOUS | Status: DC

## 2015-11-19 MED ORDER — MIDAZOLAM HCL 2 MG/2ML IJ SOLN
2.0000 mg | INTRAMUSCULAR | Status: DC | PRN
  Administered 2015-11-19: 2 mg via INTRAVENOUS
  Filled 2015-11-19 (×5): qty 2

## 2015-11-19 MED ORDER — ONDANSETRON HCL 4 MG/2ML IJ SOLN
4.0000 mg | Freq: Four times a day (QID) | INTRAMUSCULAR | Status: DC | PRN
  Administered 2015-11-22: 4 mg via INTRAVENOUS
  Filled 2015-11-19: qty 2

## 2015-11-19 MED ORDER — PROPOFOL 1000 MG/100ML IV EMUL
INTRAVENOUS | Status: AC
  Administered 2015-11-19: 1000 mg
  Filled 2015-11-19: qty 100

## 2015-11-19 MED ORDER — IPRATROPIUM-ALBUTEROL 0.5-2.5 (3) MG/3ML IN SOLN
3.0000 mL | Freq: Two times a day (BID) | RESPIRATORY_TRACT | Status: DC
  Administered 2015-11-19 – 2015-11-23 (×8): 3 mL via RESPIRATORY_TRACT
  Filled 2015-11-19 (×8): qty 3

## 2015-11-19 NOTE — ED Notes (Signed)
MD at bedside. 

## 2015-11-19 NOTE — ED Notes (Signed)
Pt cleaned of urine and very small amot stool.

## 2015-11-19 NOTE — ED Notes (Signed)
Pt becomes agitated with bronch visualization.

## 2015-11-19 NOTE — H&P (Addendum)
[facility] History and Physical  Tyress Loden GQB:[id number] DOB: 10-07-1961 DOA: 11/19/2015  Referring physician: Dr Rubin Payor - [facility] PCP: Lora Paula, MD   Chief Complaint: SOB  HPI: Sean Hinton is a 55 y.o. male  Shortness of breath. Started 2 days ago. Initially intermittent but now constant. Recently admitted at the beginning of January for NSTEMI. Shortness breath is associated with productive cough with some blood. Denies fevers. Chest pain started in the ED and is substernal without radiation. Denies any lower extremity swelling. Patient's symptoms became worse after smoking a cigarette. Denies any recent drug use.   Breathing treatment w/ mild improvement.   Review of Systems:  Constitutional:  No weight loss, night sweats, Fevers, HEENT:  No headaches, Difficulty swallowing,Tooth/dental problems,Sore throat, Cardio-vascular: Per HPi GI:  No heartburn, indigestion, abdominal pain, nausea, vomiting, diarrhea, change in bowel habits, loss of appetite  Resp: Per [facility]Pi Skin:  no rash or lesions.  GU:  no dysuria, change in color of urine, no urgency or frequency. No flank pain.  Musculoskeletal:   No joint pain or swelling. No decreased range of motion. No back pain.  Psych:  No change in mood or affect. No depression or anxiety. No memory loss.  Neuro:  No change in sensation, unilateral strength, or cognitive abilities  All other systems were reviewed and are negative.  Past Medical History  Diagnosis Date  . Diabetes mellitus with complication (HCC)   . Hypertension   . Syncope     a. 02/2015 - felt 2/2 rapid AF/AFL.  Marland Kitchen Atrial fibrillation and flutter (HCC)     a. Dx 02/2015 - placed on Xarelto; b. 09/2015 recurrence in setting of PNA.  . Ischemic cardiomyopathy     a. 09/2015 Echo: EF 35-40%.  Marland Kitchen CAD (coronary artery disease)     a. Dx 02/2015 - 2V CAD with CTO LAD, diffuse dz in PDA, OM - med rx.  . Hypertensive heart disease   .  Hyperlipidemia   . Mitral regurgitation     a. Mod by echo 02/2015,  . Tobacco abuse   . Chronic systolic CHF (congestive heart failure) (HCC)     a. 09/2015 Echo: EF 35-40%, sev apical/antlat, apical HK, mild MR, mildly dil LA.  Marland Kitchen ARF (acute respiratory failure) (HCC)   . Pneumonia 09/2015  . GERD (gastroesophageal reflux disease)   . Headache   . Arthritis     ra  . NSTEMI (non-ST elevated myocardial infarction) (HCC) 10/2015   Past Surgical History  Procedure Laterality Date  . Right thumb surgery     . Cardiac catheterization N/A 02/24/2015    Procedure: Left Heart Cath and Coronary Angiography;  Surgeon: Marykay Lex, MD;  Location: [facility] INVASIVE CV LAB CUPID;  Service: Cardiovascular;  Laterality: N/A;  . Cardiac catheterization  02/24/2015    Procedure: Coronary/Bypass Graft CTO Intervention;  Surgeon: Marykay Lex, MD;  Location: [facility] INVASIVE CV LAB CUPID;  Service: Cardiovascular;;  . Cardiac catheterization N/A 11/03/2015    Procedure: Left Heart Cath and Coronary Angiography;  Surgeon: Tonny Bollman, MD;  Location: [facility] INVASIVE CV LAB;  Service: Cardiovascular;  Laterality: N/A;  . Cardiac catheterization N/A 11/03/2015    Procedure: Coronary Stent Intervention;  Surgeon: Tonny Bollman, MD;  Location: [facility] INVASIVE CV LAB;  Service: Cardiovascular;  Laterality: N/A;  RAMUS   Social History:  reports that he has been smoking Cigarettes.  He has a 7.5 pack-year smoking history. He has never used smokeless tobacco. He  reports that he uses illicit drugs (Cocaine). He reports that he does not drink alcohol.  No Known Allergies  Family History  Problem Relation Age of Onset  . Diabetes Mother   . Hypertension Mother   . Heart disease Mother   . Cancer Mother     stomach cancer   . Diabetes Father   . Hypertension Father   . Heart disease Father   . Diabetes Sister   . Hypertension Sister   . Cancer Sister     breast cancer   . Diabetes Brother   . Hypertension Brother   .  Heart disease Brother   . Stroke Brother   . Heart attack Mother   . Heart attack Father   . Heart attack Brother      Prior to Admission medications   Medication Sig Start Date End Date Taking? Authorizing Provider  albuterol (PROVENTIL HFA;VENTOLIN HFA) 108 (90 BASE) MCG/ACT inhaler Inhale 2 puffs into the lungs every 6 (six) hours as needed for wheezing or shortness of breath. 10/17/15  Yes Ripudeep Jenna Luo, MD  aspirin 81 MG chewable tablet Chew 1 tablet (81 mg total) by mouth daily. 11/06/15  Yes Joseph Art, DO  atorvastatin (LIPITOR) 80 MG tablet Take 1 tablet (80 mg total) by mouth every evening. 10/17/15  Yes Ripudeep Jenna Luo, MD  carvedilol (COREG) 12.5 MG tablet Take 1 tablet (12.5 mg total) by mouth 2 (two) times daily with a meal. 11/06/15  Yes Joseph Art, DO  clopidogrel (PLAVIX) 75 MG tablet Take 1 tablet (75 mg total) by mouth daily with breakfast. 11/06/15  Yes Joseph Art, DO  Dexlansoprazole (DEXILANT) 30 MG capsule Take 1 capsule (30 mg total) by mouth daily. 12/30/14  Yes Josalyn Funches, MD  digoxin (LANOXIN) 0.125 MG tablet Take 1 tablet (0.125 mg total) by mouth daily. 10/17/15  Yes Ripudeep Jenna Luo, MD  furosemide (LASIX) 40 MG tablet Take 1 tablet (40 mg total) by mouth daily. 02/25/15  Yes Dayna N Dunn, PA-C  glipiZIDE (GLUCOTROL) 10 MG tablet Take 1 tablet (10 mg total) by mouth 2 (two) times daily before a meal. Patient taking differently: Take 5 mg by mouth daily before breakfast.  11/06/15  Yes Joseph Art, DO  hydrALAZINE (APRESOLINE) 50 MG tablet Take 1 tablet (50 mg total) by mouth 3 (three) times daily. 11/06/15  Yes Jessica U Vann, DO  ipratropium-albuterol (DUONEB) 0.5-2.5 (3) MG/3ML SOLN Take 3 mLs by nebulization 2 (two) times daily. 10/17/15  Yes Ripudeep Jenna Luo, MD  isosorbide mononitrate (IMDUR) 30 MG 24 hr tablet Take 1 tablet (30 mg total) by mouth daily. 11/11/15  Yes Jaclyn Shaggy, MD  lisinopril (PRINIVIL,ZESTRIL) 20 MG tablet Take 1 tablet (20 mg  total) by mouth daily. 11/06/15  Yes Joseph Art, DO  nitroGLYCERIN (NITROSTAT) 0.4 MG SL tablet Place 1 tablet (0.4 mg total) under the tongue every 5 (five) minutes as needed for chest pain (up to 3 doses). 02/25/15  Yes Dayna N Dunn, PA-C  potassium chloride SA (K-DUR,KLOR-CON) 20 MEQ tablet Take 1 tablet (20 mEq total) by mouth daily. 10/17/15  Yes Ripudeep Jenna Luo, MD  rivaroxaban (XARELTO) 20 MG TABS tablet Take 1 tablet (20 mg total) by mouth daily with supper. Patient taking differently: Take 20 mg by mouth daily.  02/25/15  Yes Dayna N Dunn, PA-C  spironolactone (ALDACTONE) 25 MG tablet Take 1 tablet (25 mg total) by mouth daily. 02/25/15  Yes Laurann Montana, PA-C  traMADol (ULTRAM) 50 MG tablet Take 1 tablet (50 mg total) by mouth every 6 (six) hours as needed for severe pain. 11/06/15  Yes Joseph Art, DO   Physical Exam: Filed Vitals:   11/19/15 0800 11/19/15 0830 11/19/15 0845 11/19/15 1102  BP: 121/79 130/107 142/94 143/99  Pulse: 108 107 109 106  Temp:      Resp:    41  SpO2: 88% 92% 93% 100%    Wt Readings from Last 3 Encounters:  11/17/15 76.295 kg (168 lb 3.2 oz)  11/11/15 73.936 kg (163 lb)  11/06/15 69.3 kg (152 lb 12.5 oz)    General:  Appears calm and comfortable Eyes:  PERRL, EOMI, normal lids, iris ENT:  grossly normal hearing, lips & tongue Neck:  no LAD, masses or thyromegaly Cardiovascular:  RRR, no m/r/g. No LE edema.  Respiratory: Coarse breath sounds throughout, decreased aeration in bases, speaks in short sentences.  Abdomen:  soft, ntnd Skin:  no rash or induration seen on limited exam Musculoskeletal:  grossly normal tone BUE/BLE Psychiatric:  grossly normal mood and affect, speech fluent and appropriate Neurologic:  CN 2-12 grossly intact, moves all extremities in coordinated fashion.          Labs on Admission:  Basic Metabolic Panel:  Recent Labs Lab 11/17/15 1106 11/19/15 0804  NA 141 140  K 4.4 3.6  CL 108 104  CO2 22 23  GLUCOSE 157*  260*  BUN 25 22*  CREATININE 1.21 1.26*  CALCIUM 8.8 9.1   Liver Function Tests:  Recent Labs Lab 11/19/15 0804  AST 21  ALT 23  ALKPHOS 102  BILITOT 0.7  PROT 6.8  ALBUMIN 2.8*   No results for input(s): LIPASE, AMYLASE in the last 168 hours. No results for input(s): AMMONIA in the last 168 hours. CBC:  Recent Labs Lab 11/17/15 1106 11/19/15 0804  WBC 6.6 17.1*  NEUTROABS  --  15.1*  HGB 12.1* 13.7  HCT 37.4* 40.1  MCV 86.6 85.0  PLT 257 222   Cardiac Enzymes:  Recent Labs Lab 11/19/15 0804  TROPONINI 0.46*    BNP (last 3 results)  Recent Labs  10/15/15 0350 11/03/15 0004 11/19/15 0804  BNP 606.7* 571.9* 1227.4*    ProBNP (last 3 results) No results for input(s): PROBNP in the last 8760 hours.   CREATININE: 1.26 mg/dL ABNORMAL (46/27/03 5009) Estimated creatinine clearance - 70.8 mL/min  CBG: No results for input(s): GLUCAP in the last 168 hours.  Radiological Exams on Admission: Dg Chest 2 View  11/19/2015  CLINICAL DATA:  Shortness of breath. EXAM: CHEST  2 VIEW COMPARISON:  11/03/2015. FINDINGS: Normal sized heart. Interval mild patchy opacity in the right upper lobe and superior segment of the right lower lobe. Decreased prominence of the interstitial markings. The hila are more prominent, rounded and denser, specially on the right. Mild thoracic spine degenerative changes. IMPRESSION: 1. Interval probable mild pneumonia in the right upper lobe and superior segment of the right lower lobe. 2. Interval possible bilateral hilar adenopathy, greater than on the right. 3. Improving interstitial pulmonary edema. Electronically Signed   By: Beckie Salts M.D.   On: 11/19/2015 07:41     Assessment/Plan Principal Problem:   Acute respiratory failure (HCC) Active Problems:   Essential hypertension   Hyperlipidemia   Acute on chronic systolic CHF (congestive heart failure) (HCC)   PAF (paroxysmal atrial fibrillation) (HCC)   Type 2 diabetes mellitus  with vascular disease (HCC)   NSTEMI (non-ST elevated  myocardial infarction) (HCC)   Cocaine abuse   HCAP (healthcare-associated pneumonia)   Acute respiratory failure: Likely secondary to acute CHF decompensation, HCAP. No formal Dx of COPD but likely contributing. BNP 1227, WBC 17.1, O2 88% on room air, tachycardic. Afebrile. Associated with bloody sputum which is likely secondary to recently being placed on aspirin and Plavix and Xarelto vs PE. - Admit - Lasix 40 IV BID (PO 40 at home) - outpt PFT - procalcitonin - Sputum Cx, BCX, legionella and strep ag  Elevated troponin: Associated w/ CP. Recetn admit for NSTEMI. Troponin as high as >65. Suspect downward trend but cannot r/o new strain/ischemia. - Trend Trop - EKG in AM - tele - CTA chest (on ASA, plavix, and xarelto but question compliance and w/ acute onset of CP and hemoptysis)  - Stat Echo (concern for myocardial injury fro previous STEMI w/ resulting decompensation or early rupture) - Stop bblocker due to ongoing cocaine abuse - Cardiology consult  CKD: Cr 1.26. At baseline. Anticipate elevation with increased diuresis - BMET in am  Substance abuse: Recently positive for cocaine and opioids. Patient adamantly denies. - UDS  HTN: - continue imdur, coreg, hydralazine, lisinopril  CHF: acute decompensation as above. Last Echo showing EF 35%.  - continue ACEi - Stop bblocker due to ongoing cocaine abuse - Diuresis as above - continue spironolactone, digoxin  Afib: currently rate controlled - continue digoxin  DM:  - SSI - A1c  Chronic Pain: - continue tramadol  HLD: - continue statin  GERD: - continue dexilant.   Code Status: FULL  DVT Prophylaxis: xarelto Family Communication: wife Disposition Plan: Pending Improvement    MERRELL, DAVID Shela Commons, MD Family Medicine [facility] [email or web address] Password [email or web address]

## 2015-11-19 NOTE — ED Notes (Signed)
Pt placed on 2l/ after resp rx.

## 2015-11-19 NOTE — ED Notes (Signed)
Pt arrives with [facility] onset two days ago, states cough with productive white sputum. States similar to when [facility] with pneumonia last month. Appears uncomfortable, no use of accessory muscles.

## 2015-11-19 NOTE — Consult Note (Signed)
PULMONARY / CRITICAL CARE MEDICINE   Name: Sean Hinton MRN: [id number] DOB: 1961-04-21    ADMISSION DATE:  11/19/2015 CONSULTATION DATE:  11/18/14  REFERRING MD:  Dr. Rubin Payor   CHIEF COMPLAINT:  SOB   HISTORY OF PRESENT ILLNESS:   55 y/o M, tobacco abuse / polysubstance abuse (cocaine), with PMH of uncontrolled DM, HTN, AF / Flutter on Xarelto, ICM, CAD, HLD, Mitral Regurgitation, CHF, (EF 30-35% 11/04/15), GERD, headaches and recent admit from 1/8-1/12/17 for NSTEMI with acute respiratory failure who presented to [facility] on 11/19/15 with shortness of breath and cough with bloody sputum production.    On admit, the patient initially denied drug abuse.  However, UDS was positive for barbiturates and cocaine.  After further discussion, he admitted to use.  In the ER, he began having substernal chest pain without radiation.  He was given a breathing treatment with mild improvement in symptoms.  The patient was initially admitted by [facility] and seen in consultation by Cardiology.  Initial troponin 0.46, BNP 1227 and creatinine 1.26.  EKG with NSR, non-specific T wave abnormality.  CXR demonstrated pulmonary edema with concern for RUL, RLL PNA.  He later developed worsening shortness of breath, chest pain, hypertension and diaphoresis.  He decompensated with respiratory distress and required intubation in the ER.   PCCM called for ICU admission.  PAST MEDICAL HISTORY :  He  has a past medical history of Diabetes mellitus with complication (HCC); Hypertension; Syncope; Atrial fibrillation and flutter (HCC); Ischemic cardiomyopathy; CAD (coronary artery disease); Hypertensive heart disease; Hyperlipidemia; Mitral regurgitation; Tobacco abuse; Chronic systolic CHF (congestive heart failure) (HCC); ARF (acute respiratory failure) (HCC); Pneumonia (09/2015); GERD (gastroesophageal reflux disease); Headache; Arthritis; and NSTEMI (non-ST elevated myocardial infarction) (HCC) (10/2015).  PAST SURGICAL  HISTORY: He  has past surgical history that includes right thumb surgery ; Cardiac catheterization (N/A, 02/24/2015); Cardiac catheterization (02/24/2015); Cardiac catheterization (N/A, 11/03/2015); and Cardiac catheterization (N/A, 11/03/2015).  No Known Allergies  No current facility-administered medications on file prior to encounter.   Current Outpatient Prescriptions on File Prior to Encounter  Medication Sig  . albuterol (PROVENTIL HFA;VENTOLIN HFA) 108 (90 BASE) MCG/ACT inhaler Inhale 2 puffs into the lungs every 6 (six) hours as needed for wheezing or shortness of breath.  Marland Kitchen aspirin 81 MG chewable tablet Chew 1 tablet (81 mg total) by mouth daily.  Marland Kitchen atorvastatin (LIPITOR) 80 MG tablet Take 1 tablet (80 mg total) by mouth every evening.  . clopidogrel (PLAVIX) 75 MG tablet Take 1 tablet (75 mg total) by mouth daily with breakfast.  . Dexlansoprazole (DEXILANT) 30 MG capsule Take 1 capsule (30 mg total) by mouth daily.  . digoxin (LANOXIN) 0.125 MG tablet Take 1 tablet (0.125 mg total) by mouth daily.  . furosemide (LASIX) 40 MG tablet Take 1 tablet (40 mg total) by mouth daily.  Marland Kitchen glipiZIDE (GLUCOTROL) 10 MG tablet Take 1 tablet (10 mg total) by mouth 2 (two) times daily before a meal. (Patient taking differently: Take 5 mg by mouth daily before breakfast. )  . hydrALAZINE (APRESOLINE) 50 MG tablet Take 1 tablet (50 mg total) by mouth 3 (three) times daily.  Marland Kitchen ipratropium-albuterol (DUONEB) 0.5-2.5 (3) MG/3ML SOLN Take 3 mLs by nebulization 2 (two) times daily.  . isosorbide mononitrate (IMDUR) 30 MG 24 hr tablet Take 1 tablet (30 mg total) by mouth daily.  Marland Kitchen lisinopril (PRINIVIL,ZESTRIL) 20 MG tablet Take 1 tablet (20 mg total) by mouth daily.  . nitroGLYCERIN (NITROSTAT) 0.4 MG SL tablet Place 1  tablet (0.4 mg total) under the tongue every 5 (five) minutes as needed for chest pain (up to 3 doses).  . potassium chloride SA (K-DUR,KLOR-CON) 20 MEQ tablet Take 1 tablet (20 mEq total) by mouth  daily.  . rivaroxaban (XARELTO) 20 MG TABS tablet Take 1 tablet (20 mg total) by mouth daily with supper. (Patient taking differently: Take 20 mg by mouth daily. )  . spironolactone (ALDACTONE) 25 MG tablet Take 1 tablet (25 mg total) by mouth daily.  . traMADol (ULTRAM) 50 MG tablet Take 1 tablet (50 mg total) by mouth every 6 (six) hours as needed for severe pain.    FAMILY HISTORY:  His indicated that his mother is alive. He indicated that his father is deceased.   SOCIAL HISTORY: He  reports that he has been smoking Cigarettes.  He has a 7.5 pack-year smoking history. He has never used smokeless tobacco. He reports that he uses illicit drugs (Cocaine). He reports that he does not drink alcohol.  REVIEW OF SYSTEMS:   Unable to complete as patient is altered on mechanical ventilation.   SUBJECTIVE:   VITAL SIGNS: BP 143/99 mmHg  Pulse 68  Temp(Src) 97.7 F (36.5 C)  Resp 33  SpO2 85%  HEMODYNAMICS:    VENTILATOR SETTINGS: Vent Mode:  [-]  FiO2 (%):  [40 %] 40 %  INTAKE / OUTPUT:    PHYSICAL EXAMINATION: General:  Thin adult male sedate on vent  Neuro:  Sedate, no distress.  Prior to intubation, normal neuro status HEENT:  ETT, MM pink/moist, poor dentition  Cardiovascular:  s1s2 rrr, tachy, no m/r/g Lungs:  Even/non-labored, lungs bilaterally with crackles  Abdomen:  NTND, bsx4 active  Musculoskeletal:  No acute deformities  Skin:  Warm/dry, no edema   LABS:  BMET  Recent Labs Lab 11/17/15 1106 11/19/15 0804  NA 141 140  K 4.4 3.6  CL 108 104  CO2 22 23  BUN 25 22*  CREATININE 1.21 1.26*  GLUCOSE 157* 260*    Electrolytes  Recent Labs Lab 11/17/15 1106 11/19/15 0804  CALCIUM 8.8 9.1    CBC  Recent Labs Lab 11/17/15 1106 11/19/15 0804  WBC 6.6 17.1*  HGB 12.1* 13.7  HCT 37.4* 40.1  PLT 257 222    Coag's No results for input(s): APTT, INR in the last 168 hours.  Sepsis Markers No results for input(s): LATICACIDVEN, PROCALCITON,  O2SATVEN in the last 168 hours.  ABG  Recent Labs Lab 11/19/15 1228  PHART 7.354  PCO2ART 46.0*  PO2ART 206.0*    Liver Enzymes  Recent Labs Lab 11/19/15 0804  AST 21  ALT 23  ALKPHOS 102  BILITOT 0.7  ALBUMIN 2.8*    Cardiac Enzymes  Recent Labs Lab 11/19/15 0804  TROPONINI 0.46*    Glucose No results for input(s): GLUCAP in the last 168 hours.  Imaging Dg Chest 2 View  11/19/2015  CLINICAL DATA:  Shortness of breath. EXAM: CHEST  2 VIEW COMPARISON:  11/03/2015. FINDINGS: Normal sized heart. Interval mild patchy opacity in the right upper lobe and superior segment of the right lower lobe. Decreased prominence of the interstitial markings. The hila are more prominent, rounded and denser, specially on the right. Mild thoracic spine degenerative changes. IMPRESSION: 1. Interval probable mild pneumonia in the right upper lobe and superior segment of the right lower lobe. 2. Interval possible bilateral hilar adenopathy, greater than on the right. 3. Improving interstitial pulmonary edema. Electronically Signed   By: Zada Finders.D.  On: 11/19/2015 07:41     STUDIES:  1/25  ECHO >>   CULTURES: Sputum 1/25 >> BCx2 1/25 >>   ANTIBIOTICS: Vanco 1/25 >>  Maxipime 1/25 >>   SIGNIFICANT EVENTS: 1/25  Admit with chest pain, SOB.  Cocaine +   LINES/TUBES: ETT 1/25 >>   DISCUSSION: 55 y/o M with PMH of cocaine abuse and recent admit for NSTEMI admitted 1/25 with SOB, chest pain and hemoptysis.  Developed respiratory failure, intubated in ER.    ASSESSMENT / PLAN:  PULMONARY A: Acute Respiratory Failure - in setting of acute pulmonary edema, hemoptysis (on anticoagulation) and cocaine abuse Pulmonary Edema  ? RUL, RLL Infiltrate Hemoptysis - cough, pulmonary edema in setting of cocaine abuse  Tobacco Abuse  P:   PRVC 8cc/kg Wean PEEP / FiO2 for sats > 92% Intermittent CXR  PRN albuterol  Lasix 60 mg Q8 Monitor hemoptysis / bleeding    CARDIOVASCULAR A:  Hypertension  Cocaine Abuse  Recent NSTEMI - on ASA, Plavix Hx AF on Xarelto  P:  Hold xarelto  Continue ASA / Plavix  Cardiology following  Trend BNP, troponin  Reassess ECHO   RENAL A:   At Risk AKI - in setting of diuresis  P:   Trend BMP / UOP  Replace electrolytes as indicated  KCL BID with scheduled lasix   GASTROINTESTINAL A:   No acute issues  P:   NPO  OGT  Begin TF  PPI for SUP   HEMATOLOGIC A:   Leukocytosis  P:  Monitor CBC SCD's for DVT prophylaxis  HOLD xarelto for now, consider restart in am if no further  INFECTIOUS A:   ? PNA - RUL / RLL airspace disease, edema vs infiltrate P:   ABX as above Trend PCT Follow cultures as above   ENDOCRINE A:   No acute issues   P:   Monitor glucose   NEUROLOGIC A:   No acute issues - normal mental status prior to intubation P:   RASS goal: -1 to -2 Propofol for sedation  PRN fentanyl   FAMILY  - Updates: Family updated at bedside  - Inter-disciplinary family meet or Palliative Care meeting due by:  1/31   Canary Brim, NP-C [facility] Pgr: [phone] or if no answer [phone] 11/19/2015, 12:53 PM  STAFF NOTE: I, Rory Percy, MD FACP have personally reviewed patient's available data, including medical history, events of note, physical examination and test results as part of my evaluation. I have discussed with resident/NP and other care providers such as pharmacist, RN and RRT. In addition, I personally evaluated patient and elicited key findings of:  Distress severe, frothy bloody pulm edema looking secretions excessive, ronchi diffuse severe, tachycardia, recent stent, MI now again coaine use, concern is pulm edema, r/o new ischemic complication from prior infarct, he requires intubation with severe distress, accessory muscle use, stress ischemia risk and concern bloody worsening secretions, d/w wife, place ett, then 8 cc/kg rate 18, 100% peep 5,  then repeat abg, with edema may be veyr peep responsive if required, c/w cards, NTG agree, requires heavy deeper sedation , rass goal -3, not impressed infection, no need abx, hold anticoaguation with bloody secretions, if DAH noted will bronch, repeat pcxr stat, get stat echo The patient is critically ill with multiple organ systems failure and requires high complexity decision making for assessment and support, frequent evaluation and titration of therapies, application of advanced monitoring technologies and extensive interpretation of multiple databases.  Critical Care Time devoted to patient care services described in this note is 45 Minutes. This time reflects time of care of this signee: Rory Percy, MD FACP. This critical care time does not reflect procedure time, or teaching time or supervisory time of PA/NP/Med student/Med Resident etc but could involve care discussion time. Rest per NP/medical resident whose note is outlined above and that I agree with  Sean Hinton. Sean Alias, MD, FACP Pgr: [phone] [facility] 11/19/2015 4:27 PM

## 2015-11-19 NOTE — ED Notes (Signed)
Pt given sandwich, crackers, and gingerale 

## 2015-11-19 NOTE — ED Notes (Signed)
Dr. Kelby Aline and Celestia Khat contacted with resp statusand desat to 85%

## 2015-11-19 NOTE — Code Documentation (Signed)
Patient being bag.

## 2015-11-19 NOTE — ED Notes (Signed)
Bilatr breath sounds noted.

## 2015-11-19 NOTE — Progress Notes (Signed)
ANTIBIOTIC CONSULT NOTE - INITIAL  Pharmacy Consult for vancomycin Indication: rule out pneumonia  No Known Allergies  Patient Measurements:   Adjusted Body Weight:   Vital Signs: Temp: 97.7 F (36.5 C) (01/25 0713) BP: 142/94 mmHg (01/25 0845) Pulse Rate: 109 (01/25 0845) Intake/Output from previous day:   Intake/Output from this shift:    Labs:  Recent Labs  11/17/15 1106 11/19/15 0804  WBC 6.6 17.1*  HGB 12.1* 13.7  PLT 257 222  CREATININE 1.21 1.26*   Estimated Creatinine Clearance: 70.8 mL/min (by C-G formula based on Cr of 1.26). No results for input(s): VANCOTROUGH, VANCOPEAK, VANCORANDOM, GENTTROUGH, GENTPEAK, GENTRANDOM, TOBRATROUGH, TOBRAPEAK, TOBRARND, AMIKACINPEAK, AMIKACINTROU, AMIKACIN in the last 72 hours.   Microbiology: Recent Results (from the past 720 hour(s))  Blood culture (routine x 2)     Status: None   Collection Time: 11/03/15  1:06 AM  Result Value Ref Range Status   Specimen Description BLOOD LEFT UPPER ARM  Final   Special Requests BOTTLES DRAWN AEROBIC AND ANAEROBIC 5CC   Final   Culture NO GROWTH 5 DAYS  Final   Report Status 11/08/2015 FINAL  Final  Blood culture (routine x 2)     Status: None   Collection Time: 11/03/15  1:13 AM  Result Value Ref Range Status   Specimen Description BLOOD LEFT HAND  Final   Special Requests BOTTLES DRAWN AEROBIC AND ANAEROBIC 5CC  Final   Culture NO GROWTH 5 DAYS  Final   Report Status 11/08/2015 FINAL  Final  MRSA PCR Screening     Status: None   Collection Time: 11/03/15  5:14 PM  Result Value Ref Range Status   MRSA by PCR NEGATIVE NEGATIVE Final    Comment:        The GeneXpert MRSA Assay (FDA approved for NASAL specimens only), is one component of a comprehensive MRSA colonization surveillance program. It is not intended to diagnose MRSA infection nor to guide or monitor treatment for MRSA infections.     Medical History: Past Medical History  Diagnosis Date  . Diabetes  mellitus with complication (HCC)   . Hypertension   . Syncope     a. 02/2015 - felt 2/2 rapid AF/AFL.  Marland Kitchen Atrial fibrillation and flutter (HCC)     a. Dx 02/2015 - placed on Xarelto; b. 09/2015 recurrence in setting of PNA.  . Ischemic cardiomyopathy     a. 09/2015 Echo: EF 35-40%.  Marland Kitchen CAD (coronary artery disease)     a. Dx 02/2015 - 2V CAD with CTO LAD, diffuse dz in PDA, OM - med rx.  . Hypertensive heart disease   . Hyperlipidemia   . Mitral regurgitation     a. Mod by echo 02/2015,  . Tobacco abuse   . Chronic systolic CHF (congestive heart failure) (HCC)     a. 09/2015 Echo: EF 35-40%, sev apical/antlat, apical HK, mild MR, mildly dil LA.  Marland Kitchen ARF (acute respiratory failure) (HCC)   . Pneumonia 09/2015  . GERD (gastroesophageal reflux disease)   . Headache   . Arthritis     ra  . NSTEMI (non-ST elevated myocardial infarction) (HCC) 10/2015    Medications:  Anti-infectives    Start     Dose/Rate Route Frequency Ordered Stop   11/19/15 2200  vancomycin (VANCOCIN) IVPB 1000 mg/200 mL premix     1,000 mg 200 mL/hr over 60 Minutes Intravenous Every 12 hours 11/19/15 0911     11/19/15 0815  vancomycin (VANCOCIN) 1,500 mg in  sodium chloride 0.9 % 500 mL IVPB     1,500 mg 250 mL/hr over 120 Minutes Intravenous  Once 11/19/15 0801     11/19/15 0800  piperacillin-tazobactam (ZOSYN) IVPB 3.375 g     3.375 g 100 mL/hr over 30 Minutes Intravenous  Once 11/19/15 0756       Assessment: 54 yom presented to the ED with SOB. To start empiric vancomycin for possible pneumonia. Pt is afebrile but WBC is elevated at 17.1. Scr is 1.26.  Vanc 1/25>> Zosyn x 1/25  Goal of Therapy:  Vancomycin trough level 15-20 mcg/ml  Plan:  - Vancomycin 1500mg  IV x 1 then 1gm IV Q12H - F/u renal fxn, C&S, clinical status and trough at [facility] - F/u continuation of zosyn or other gram negative coverage  Madaline Lefeber, [facility] 11/19/2015,9:11 AM

## 2015-11-19 NOTE — ED Notes (Signed)
Pt again suctioned with

## 2015-11-19 NOTE — Code Documentation (Signed)
Patient Sats 100% bagging.

## 2015-11-19 NOTE — Telephone Encounter (Signed)
Pt wanted to know what medication we increased yesterday during lab result.  S/w pt said it does not matter today pt is currently admitted and the hospital will take care of medications.  Will send to [place] to [place].

## 2015-11-19 NOTE — Code Documentation (Signed)
Et tube placed Stats 100% postive color change Equal rise.

## 2015-11-19 NOTE — ED Notes (Signed)
Pt call to room. C/o SHOB and increased difficulty breathing. Pt has bilat course sounds at bases.

## 2015-11-19 NOTE — Telephone Encounter (Signed)
New Message    Pt is calling to speak to Lawson Fiscal  He is unsure of which medication that he needs to   take a half pill of he is being admitted into [facility] for CHF and Pneumonia

## 2015-11-19 NOTE — ED Notes (Signed)
2 cc propofol bolus given for movement

## 2015-11-19 NOTE — Progress Notes (Signed)
Echocardiogram 2D Echocardiogram has been performed.  Dorothey Baseman 11/19/2015, 5:57 PM

## 2015-11-19 NOTE — ED Notes (Signed)
Report to [place] in icu. Some coughing noted. Pt pt remains calm.

## 2015-11-19 NOTE — ED Notes (Signed)
Pt again suctioned mouth and tube.

## 2015-11-19 NOTE — ED Notes (Signed)
MD at bedside.MD Mannam in with bronch

## 2015-11-19 NOTE — Consult Note (Signed)
CARDIOLOGY CONSULT NOTE   Patient ID: Sean Hinton MRN: [id number] DOB/AGE: [birth year] 55 y.o.  Admit date: 11/19/2015  Primary Physician   Lora Paula, MD Primary Cardiologist  Dr. Royann Shivers Reason for Consultation   Chest pain Requesting Physician Dr.  Konrad Dolores  HPI: Sean Hinton is a 55 y.o. male with a history of HTN, CAD, systolic CHF, ICM (Ef of 35-40%), ongoing polysubstance abuse, paroxysmal AF, MR and uncontrolled diabetes mellitus who came to [facility] ED for evaluation of SOB x 2 days.   He was admitted December 2016 for acute respiratory failure in the setting of multilobar pneumonia, CHF exacerbation, atrial fibrillation with RVR and recent cocaine use. Troponin was elevated but consistent with demand ischemia. He was placed on digoxin to help control heart rate.   He presented to [facility] on 1/8 after started having chest pain and shortness of breath while shoveling snow. He was found to have NSTEMI. Cath showed severe 3 vessel CAD with total occlusion of the LAD (chronic), total thrombotic occlusion of the ramus intermedius (acute), and severe diffuse distal vessel disease in the RCA. Severely elevated LVEDP with acute pulmonary edema, improved during the course of the procedure with IV furosemide and NTG and coronary reperfusion. S/p DES to Remus. He was placed on ASA+Plavix+Xarelto for 30 days (cath report listed as 3 months), then to stop ASA and continue Plavix and Xarelto for the next 12 months. Will need echo in 3 months for possible ICD. Switched to [employer]. ACE 1 started. Seen by Norma Fredrickson 11/17/15 in clinic.   Since then patient started to having shortness of breath which has been progressively getting worse and now has productive cough with some blood. Felt similar to prior to admission of pneumonia December 2016. He uses cocaine last night. He came to ED for worsening shortness of breath with tobacco smoking. While in the ED he had a substernal chest  pain which he described as a sharp. Currently rates 8 out of 10 Chest pain.   Urine drug screen positive for cocaine and barbiturates. troponin I of 0.46, during last admission it was greater than 65. BNP of 1227.4 today which is increased from 693.21/23/17. creatinine of 1.26. EKG showed  normal sinus rhythm at rate of 106bpm with nonspecific T-wave abnormality.   Past Medical History  Diagnosis Date  . Diabetes mellitus with complication (HCC)   . Hypertension   . Syncope     a. 02/2015 - felt 2/2 rapid AF/AFL.  Marland Kitchen Atrial fibrillation and flutter (HCC)     a. Dx 02/2015 - placed on Xarelto; b. 09/2015 recurrence in setting of PNA.  . Ischemic cardiomyopathy     a. 09/2015 Echo: EF 35-40%.  Marland Kitchen CAD (coronary artery disease)     a. Dx 02/2015 - 2V CAD with CTO LAD, diffuse dz in PDA, OM - med rx.  . Hypertensive heart disease   . Hyperlipidemia   . Mitral regurgitation     a. Mod by echo 02/2015,  . Tobacco abuse   . Chronic systolic CHF (congestive heart failure) (HCC)     a. 09/2015 Echo: EF 35-40%, sev apical/antlat, apical HK, mild MR, mildly dil LA.  Marland Kitchen ARF (acute respiratory failure) (HCC)   . Pneumonia 09/2015  . GERD (gastroesophageal reflux disease)   . Headache   . Arthritis     ra  . NSTEMI (non-ST elevated myocardial infarction) (HCC) 10/2015     Past Surgical History  Procedure Laterality Date  .  Right thumb surgery     . Cardiac catheterization N/A 02/24/2015    Procedure: Left Heart Cath and Coronary Angiography;  Surgeon: Marykay Lex, MD;  Location: [facility] INVASIVE CV LAB CUPID;  Service: Cardiovascular;  Laterality: N/A;  . Cardiac catheterization  02/24/2015    Procedure: Coronary/Bypass Graft CTO Intervention;  Surgeon: Marykay Lex, MD;  Location: [facility] INVASIVE CV LAB CUPID;  Service: Cardiovascular;;  . Cardiac catheterization N/A 11/03/2015    Procedure: Left Heart Cath and Coronary Angiography;  Surgeon: Tonny Bollman, MD;  Location: [facility] INVASIVE CV LAB;  Service:  Cardiovascular;  Laterality: N/A;  . Cardiac catheterization N/A 11/03/2015    Procedure: Coronary Stent Intervention;  Surgeon: Tonny Bollman, MD;  Location: [facility] INVASIVE CV LAB;  Service: Cardiovascular;  Laterality: N/A;  RAMUS    No Known Allergies  I have reviewed the patient's current medications . aspirin  81 mg Oral Daily  . atorvastatin  80 mg Oral QPM  . [START ON 11/20/2015] clopidogrel  75 mg Oral Q breakfast  . digoxin  0.125 mg Oral Daily  . furosemide  40 mg Intravenous BID  . hydrALAZINE  50 mg Oral TID  . insulin aspart  0-5 Units Subcutaneous QHS  . insulin aspart  0-9 Units Subcutaneous TID WC  . ipratropium-albuterol  3 mL Nebulization BID  . isosorbide mononitrate  30 mg Oral Daily  . lisinopril  20 mg Oral Daily  . pantoprazole  40 mg Oral Daily  . potassium chloride SA  20 mEq Oral Daily  . sodium chloride flush  3 mL Intravenous Q12H  . sodium chloride flush  3 mL Intravenous Q12H  . spironolactone  25 mg Oral Daily   . sodium chloride    . ceFEPime (MAXIPIME) IV    . vancomycin     sodium chloride, acetaminophen **OR** acetaminophen, acetaminophen, albuterol, ALPRAZolam, gi cocktail, ondansetron **OR** ondansetron (ZOFRAN) IV, sodium chloride flush, traMADol  Prior to Admission medications   Medication Sig Start Date End Date Taking? Authorizing Provider  albuterol (PROVENTIL HFA;VENTOLIN HFA) 108 (90 BASE) MCG/ACT inhaler Inhale 2 puffs into the lungs every 6 (six) hours as needed for wheezing or shortness of breath. 10/17/15  Yes Ripudeep Jenna Luo, MD  aspirin 81 MG chewable tablet Chew 1 tablet (81 mg total) by mouth daily. 11/06/15  Yes Joseph Art, DO  atorvastatin (LIPITOR) 80 MG tablet Take 1 tablet (80 mg total) by mouth every evening. 10/17/15  Yes Ripudeep Jenna Luo, MD  clopidogrel (PLAVIX) 75 MG tablet Take 1 tablet (75 mg total) by mouth daily with breakfast. 11/06/15  Yes Joseph Art, DO  Dexlansoprazole (DEXILANT) 30 MG capsule Take 1 capsule (30  mg total) by mouth daily. 12/30/14  Yes Josalyn Funches, MD  digoxin (LANOXIN) 0.125 MG tablet Take 1 tablet (0.125 mg total) by mouth daily. 10/17/15  Yes Ripudeep Jenna Luo, MD  furosemide (LASIX) 40 MG tablet Take 1 tablet (40 mg total) by mouth daily. 02/25/15  Yes Dayna N Dunn, PA-C  glipiZIDE (GLUCOTROL) 10 MG tablet Take 1 tablet (10 mg total) by mouth 2 (two) times daily before a meal. Patient taking differently: Take 5 mg by mouth daily before breakfast.  11/06/15  Yes Joseph Art, DO  hydrALAZINE (APRESOLINE) 50 MG tablet Take 1 tablet (50 mg total) by mouth 3 (three) times daily. 11/06/15  Yes Jessica U Vann, DO  ipratropium-albuterol (DUONEB) 0.5-2.5 (3) MG/3ML SOLN Take 3 mLs by nebulization 2 (two) times daily. 10/17/15  Yes Ripudeep Jenna Luo, MD  isosorbide mononitrate (IMDUR) 30 MG 24 hr tablet Take 1 tablet (30 mg total) by mouth daily. 11/11/15  Yes Jaclyn Shaggy, MD  lisinopril (PRINIVIL,ZESTRIL) 20 MG tablet Take 1 tablet (20 mg total) by mouth daily. 11/06/15  Yes Joseph Art, DO  nitroGLYCERIN (NITROSTAT) 0.4 MG SL tablet Place 1 tablet (0.4 mg total) under the tongue every 5 (five) minutes as needed for chest pain (up to 3 doses). 02/25/15  Yes Dayna N Dunn, PA-C  potassium chloride SA (K-DUR,KLOR-CON) 20 MEQ tablet Take 1 tablet (20 mEq total) by mouth daily. 10/17/15  Yes Ripudeep Jenna Luo, MD  rivaroxaban (XARELTO) 20 MG TABS tablet Take 1 tablet (20 mg total) by mouth daily with supper. Patient taking differently: Take 20 mg by mouth daily.  02/25/15  Yes Dayna N Dunn, PA-C  spironolactone (ALDACTONE) 25 MG tablet Take 1 tablet (25 mg total) by mouth daily. 02/25/15  Yes Dayna N Dunn, PA-C  traMADol (ULTRAM) 50 MG tablet Take 1 tablet (50 mg total) by mouth every 6 (six) hours as needed for severe pain. 11/06/15  Yes Joseph Art, DO     Social History   Social History  . Marital Status: Single    Spouse Name: N/A  . Number of Children: N/A  . Years of Education: N/A   Occupational  History  . Not on file.   Social History Main Topics  . Smoking status: Current Every Day Smoker -- 0.25 packs/day for 30 years    Types: Cigarettes  . Smokeless tobacco: Never Used  . Alcohol Use: No  . Drug Use: Yes    Special: Cocaine     Comment: "last use 1991", Positive UDS 10/2015  . Sexual Activity: Not on file   Other Topics Concern  . Not on file   Social History Narrative    Family Status  Relation Status Death Age  . Mother Alive   . Father Deceased    Family History  Problem Relation Age of Onset  . Diabetes Mother   . Hypertension Mother   . Heart disease Mother   . Cancer Mother     stomach cancer   . Diabetes Father   . Hypertension Father   . Heart disease Father   . Diabetes Sister   . Hypertension Sister   . Cancer Sister     breast cancer   . Diabetes Brother   . Hypertension Brother   . Heart disease Brother   . Stroke Brother   . Heart attack Mother   . Heart attack Father   . Heart attack Brother       ROS:  Full 14 point review of systems complete and found to be negative unless listed above.  Physical Exam: Blood pressure 143/99, pulse 106, temperature 97.7 F (36.5 C), resp. rate 41, SpO2 100 %.  General: Well developed, well nourished, male in mild acute distress Head: Eyes PERRLA, No xanthomas. Normocephalic and atraumatic, oropharynx without edema or exudate.  Lungs: Resp mild labored. Bibasilar rales.  Heart: RR with tachycardia no s3, s4, or murmurs..   Neck: No carotid bruits. No lymphadenopathy. No JVD. Abdomen: Bowel sounds present, abdomen soft and non-tender without masses or hernias noted. Msk:  No spine or cva tenderness. No weakness, no joint deformities or effusions. Extremities: No clubbing, cyanosis or edema. DP/PT/Radials 2+ and equal bilaterally. Neuro: Alert and oriented X 3. No focal deficits noted. Psych:  Good affect, responds appropriately Skin:  No rashes or lesions noted.  Labs:   Lab Results    Component Value Date   WBC 17.1* 11/19/2015   HGB 13.7 11/19/2015   HCT 40.1 11/19/2015   MCV 85.0 11/19/2015   PLT 222 11/19/2015   No results for input(s): INR in the last 72 hours.  Recent Labs Lab 11/19/15 0804  NA 140  K 3.6  CL 104  CO2 23  BUN 22*  CREATININE 1.26*  CALCIUM 9.1  PROT 6.8  BILITOT 0.7  ALKPHOS 102  ALT 23  AST 21  GLUCOSE 260*  ALBUMIN 2.8*   MAGNESIUM  Date Value Ref Range Status  10/15/2015 1.9 1.7 - 2.4 mg/dL Final    Recent Labs  09/81/19 0804  TROPONINI 0.46*   No results for input(s): TROPIPOC in the last 72 hours. No results found for: PROBNP Lab Results  Component Value Date   CHOL 128 10/13/2015   HDL 35* 10/13/2015   LDLCALC 80 10/13/2015   TRIG 67 10/13/2015   Lab Results  Component Value Date   DDIMER 0.56* 10/13/2015   LIPASE  Date/Time Value Ref Range Status  12/19/2014 02:55 PM 24 11 - 59 U/L Final   TSH  Date/Time Value Ref Range Status  11/03/2015 11:09 AM 0.447 0.350 - 4.500 uIU/mL Final  02/20/2015 10:45 PM 1.538 0.350 - 4.500 uIU/mL Final   Cath 11/03/2015 Coronary Stent Intervention    Left Heart Cath and Coronary Angiography    Conclusion     Dist RCA lesion, 50% stenosed.  RPDA lesion, 80% stenosed.  1st RPLB lesion, 70% stenosed.  Mid RCA lesion, 50% stenosed.  Prox LAD lesion, 100% stenosed.  Ramus lesion, 100% stenosed. Post intervention, there is a 0% residual stenosis.  1. Severe 3 vessel CAD with total occlusion of the LAD (chronic), total thrombotic occlusion of the ramus intermedius (acute), and severe diffuse distal vessel disease in the RCA.  2. Severely elevated LVEDP with acute pulmonary edema, improved during the course of the procedure with IV furosemide and NTG and coronary reperfusion  Plan - admit to CCU. Consider ASA, clopidogrel, and Xarelto (paroxysmal atrial fibrillation) x 3 months, then stop ASA.    Echo: 11/04/2015 LV EF: 30% -   35%  ------------------------------------------------------------------- Indications:   CHF - 428.0.  ------------------------------------------------------------------- History:  PMH:  Atrial fibrillation. Coronary artery disease. Ischemic cardiomyopathy. PMH:  Myocardial infarction. Risk factors: Current tobacco use. Hypertension. Diabetes mellitus.  ------------------------------------------------------------------- Study Conclusions  - Left ventricle: The cavity size was normal. Wall thickness was increased in a pattern of moderate LVH. Systolic function was moderately to severely reduced. The estimated ejection fraction was in the range of 30% to 35%. Anterolateral and inferolateral severe hypokinesis. Apical septal, apical inferior, apical anterior, and apical akinesis. Peri-apical thinning. The inferolateral and the lateral papillary muscle appear hyperechoic and edematous, without thinning, suggestive of recent ischemia/infarction. Doppler parameters are consistent with abnormal left ventricular relaxation (grade 1 diastolic dysfunction). No evidence of thrombus. - Aortic valve: Structurally normal valve. Trileaflet. Transvalvular velocity was within the normal range. There was no stenosis. - Mitral valve: There was trivial regurgitation. - Left atrium: The atrium was moderately dilated. - Right ventricle: The cavity size was normal. Systolic function was normal. - Tricuspid valve: Peak RV-RA gradient (S): 32 mm Hg. - Pulmonary arteries: PA peak pressure: 35 mm Hg (S). - Inferior vena cava: The vessel was normal in size. The respirophasic diameter changes were in the normal range (= 50%), consistent with normal central  venous pressure.  Impressions:  - Normal LV size with moderate LV hypertrophy. EF 30-35% with wall motion abnormalities as noted above. Normal RV size and systolic function. No significant valvular  abnormalities. Compared to the previous study, LV systolic function is worse due to a new inferolateral wall motion abnormality.  ECG:   Vent. rate 108 BPM PR interval 132 ms QRS duration 95 ms QT/QTc 352/472 ms P-R-T axes 78 44 95  Radiology:  Dg Chest 2 View  11/19/2015  CLINICAL DATA:  Shortness of breath. EXAM: CHEST  2 VIEW COMPARISON:  11/03/2015. FINDINGS: Normal sized heart. Interval mild patchy opacity in the right upper lobe and superior segment of the right lower lobe. Decreased prominence of the interstitial markings. The hila are more prominent, rounded and denser, specially on the right. Mild thoracic spine degenerative changes. IMPRESSION: 1. Interval probable mild pneumonia in the right upper lobe and superior segment of the right lower lobe. 2. Interval possible bilateral hilar adenopathy, greater than on the right. 3. Improving interstitial pulmonary edema. Electronically Signed   By: Beckie Salts M.D.   On: 11/19/2015 07:41    ASSESSMENT AND PLAN:    Principal Problem:   Acute respiratory failure (HCC) Active Problems:   Essential hypertension   Hyperlipidemia   Acute on chronic systolic CHF (congestive heart failure) (HCC)   PAF (paroxysmal atrial fibrillation) (HCC)   Type 2 diabetes mellitus with vascular disease (HCC)   NSTEMI (non-ST elevated myocardial infarction) (HCC)   Cocaine abuse   HCAP (healthcare-associated pneumonia)   Plan: His chest pain is likely from pneumonia and cocaine induced vasospasm. Stop beta blocker. Elevated troponin is likely trending down from recent non-STEMI. He is on triple therapy for one month (ASA, plavix and xarelto). Now has a hemoptysis, likely due to pneumonia. IV diuresis for acute on chronic systolic. Strict I&O and daily weight. He needs to stop smoking tobacco and using cocaine. He has PAF and maintaining sinus rhythm.   This patients CHA2DS2-VASc Score and unadjusted Ischemic Stroke Rate (% per year) is equal to 4.8 %  stroke rate/year from a score of 4  Above score calculated as 1 point each if present [CHF, HTN, DM, Vascular=MI/PAD/Aortic Plaque, Age if 65-74, or Male] Above score calculated as 2 points each if present [Age > 75, or Stroke/TIA/TE]   Signed: Bhagat,Bhavinkumar, PA 11/19/2015, 11:32 AM Pager [phone]  Co-Sign MD As above Patient seen and examined. Briefly he is a 55 year old male with past medical history of ischemic cardiomyopathy, chronic systolic congestive heart failure, hypertension, coronary disease, paroxysmal atrial fibrillation, substance abuse, diabetes mellitus for evaluation of dyspnea, pulmonary edema, chest pain and elevated troponin. Patient recently discharged following an admission for non-ST elevation myocardial infarction. Cardiac catheterization showed total occlusion of the LAD which was chronic, occlusion of the ramus intermedius, 80% PDA. Patient had PCI of his ramus intermedius. Echocardiogram January 10 showed ejection fraction 30-35%. There was grade 1 diastolic dysfunction and moderate left atrial enlargement. Patient states he has been compliant with medications. Patient developed acute dyspnea yesterday which has slowly progressed over the past 24 hours. He denies fevers or chills and has had hemoptysis. Today he developed substernal and right-sided chest pain described as sharp pain increased with inspiration. He presented to the emergency room. At time of evaluation patient in severe respiratory distress. Sats were initially 80%. He was placed on 100% rebreather with improvement. Chest x-ray shows possible pneumonia right upper lobe and right lower lobe as well as bilateral hilar  adenopathy. BNP 1227. Troponin 0.46. White blood cell count 17.1. Drug screen positive for cocaine. Electrocardiogram shows sinus tachycardia, biatrial enlargement, septal infarct, nonspecific ST changes. 1 acute pulmonary edema/respiratory failure-Possibly precipitated by cocaine use  yesterday. Plan to diurese. Follow renal function. Add IV nitroglycerin. Note patient is also on xarelto. He has hemoptysis and this medication will be held. There is also a question of pneumonia and I will leave antibiotics to primary care. 2 Elevated troponin-there are no diagnostic ST changes on his electrocardiogram. Continue to cycle enzymes. If no clear trend up would not pursue further ischemia evaluation. 3 status post recent PCI-continue aspirin and Plavix if possible. Continue statin. 4 hypertension-blood pressure is elevated at time of evaluation. We will treat with diuretics and IV nitroglycerin. Given recent cocaine use and acute congestive heart failure we will hold his beta blocker. Resume hydralazine and lisinopril as blood pressure allows. 5 ischemic cardiomyopathy-hold beta blocker as described above. Resume ACE inhibitor and treat with nitroglycerin and Lasix. Continue digoxin for now. 6 cocaine use-Patient counseled on discontinuing. 7 paroxysmal atrial fibrillation-patient remains in sinus. Hold xarelto given hemoptysis Plan admit to CCU. Patient is extremely dyspneic. Milus Height

## 2015-11-19 NOTE — Code Documentation (Signed)
PA suction patient.

## 2015-11-19 NOTE — ED Notes (Signed)
Pt given urinal for specimen.  

## 2015-11-19 NOTE — ED Notes (Signed)
Pt reports chest pain 7/10. Denies increased shob. Same reported to PA. EKG obtained.

## 2015-11-19 NOTE — ED Notes (Signed)
Dr. Jaquelyn Bitter notified of ETco2 24-26. Some movement and chewing of tube noted. Propofol increased to 50 mcg/kg

## 2015-11-19 NOTE — ED Provider Notes (Signed)
CSN: [id number]     Arrival date & time 11/19/15  9147 History   First MD Initiated Contact with Patient 11/19/15 [phone]     Chief Complaint  Patient presents with  . Shortness of Breath    Patient is a 55 y.o. male presenting with shortness of breath. The history is provided by the patient.  Shortness of Breath Associated symptoms: chest pain and cough   Associated symptoms: no abdominal pain and no headaches    patient presents with shortness of breath and cough the last 2 days. Recent admission to hospital for non-STEMI. Has an EF of 30%. Seen by cardiology 2 days ago. Since then he's been coughing more this increased sputum production. States that his sputum and blood. No fevers. States he feels some chest tightness but does not feel like his anginal pain. No fevers. No swelling in his legs. States his volume is good. No sick contacts. He states he did smoke 1 cigarette got worse. Patient knows that he is not supposed to smoke any more cigarettes. he has denied further cocaine use.  Past Medical History  Diagnosis Date  . Diabetes mellitus with complication (HCC)   . Hypertension   . Syncope     a. 02/2015 - felt 2/2 rapid AF/AFL.  Marland Kitchen Atrial fibrillation and flutter (HCC)     a. Dx 02/2015 - placed on Xarelto; b. 09/2015 recurrence in setting of PNA.  . Ischemic cardiomyopathy     a. 09/2015 Echo: EF 35-40%.  Marland Kitchen CAD (coronary artery disease)     a. Dx 02/2015 - 2V CAD with CTO LAD, diffuse dz in PDA, OM - med rx.  . Hypertensive heart disease   . Hyperlipidemia   . Mitral regurgitation     a. Mod by echo 02/2015,  . Tobacco abuse   . Chronic systolic CHF (congestive heart failure) (HCC)     a. 09/2015 Echo: EF 35-40%, sev apical/antlat, apical HK, mild MR, mildly dil LA.  Marland Kitchen ARF (acute respiratory failure) (HCC)   . Pneumonia 09/2015  . GERD (gastroesophageal reflux disease)   . Headache   . Arthritis     ra  . NSTEMI (non-ST elevated myocardial infarction) (HCC) 10/2015   Past  Surgical History  Procedure Laterality Date  . Right thumb surgery     . Cardiac catheterization N/A 02/24/2015    Procedure: Left Heart Cath and Coronary Angiography;  Surgeon: Marykay Lex, MD;  Location: [facility] INVASIVE CV LAB CUPID;  Service: Cardiovascular;  Laterality: N/A;  . Cardiac catheterization  02/24/2015    Procedure: Coronary/Bypass Graft CTO Intervention;  Surgeon: Marykay Lex, MD;  Location: [facility] INVASIVE CV LAB CUPID;  Service: Cardiovascular;;  . Cardiac catheterization N/A 11/03/2015    Procedure: Left Heart Cath and Coronary Angiography;  Surgeon: Tonny Bollman, MD;  Location: [facility] INVASIVE CV LAB;  Service: Cardiovascular;  Laterality: N/A;  . Cardiac catheterization N/A 11/03/2015    Procedure: Coronary Stent Intervention;  Surgeon: Tonny Bollman, MD;  Location: [facility] INVASIVE CV LAB;  Service: Cardiovascular;  Laterality: N/A;  RAMUS   Family History  Problem Relation Age of Onset  . Diabetes Mother   . Hypertension Mother   . Heart disease Mother   . Cancer Mother     stomach cancer   . Diabetes Father   . Hypertension Father   . Heart disease Father   . Diabetes Sister   . Hypertension Sister   . Cancer Sister     breast cancer   .  Diabetes Brother   . Hypertension Brother   . Heart disease Brother   . Stroke Brother   . Heart attack Mother   . Heart attack Father   . Heart attack Brother    Social History  Substance Use Topics  . Smoking status: Current Every Day Smoker -- 0.25 packs/day for 30 years    Types: Cigarettes  . Smokeless tobacco: Never Used  . Alcohol Use: No    Review of Systems  Constitutional: Positive for appetite change.  HENT: Negative for facial swelling.   Respiratory: Positive for cough and shortness of breath.   Cardiovascular: Positive for chest pain. Negative for leg swelling.  Gastrointestinal: Negative for abdominal pain.  Genitourinary: Negative for flank pain.  Musculoskeletal: Negative for back pain.  Skin: Negative for  wound.  Neurological: Negative for weakness and headaches.      Allergies  Review of patient's allergies indicates no known allergies.  Home Medications   Prior to Admission medications   Medication Sig Start Date End Date Taking? Authorizing Provider  albuterol (PROVENTIL HFA;VENTOLIN HFA) 108 (90 BASE) MCG/ACT inhaler Inhale 2 puffs into the lungs every 6 (six) hours as needed for wheezing or shortness of breath. 10/17/15  Yes Ripudeep Jenna Luo, MD  aspirin 81 MG chewable tablet Chew 1 tablet (81 mg total) by mouth daily. 11/06/15  Yes Joseph Art, DO  atorvastatin (LIPITOR) 80 MG tablet Take 1 tablet (80 mg total) by mouth every evening. 10/17/15  Yes Ripudeep Jenna Luo, MD  carvedilol (COREG) 12.5 MG tablet Take 1 tablet (12.5 mg total) by mouth 2 (two) times daily with a meal. 11/06/15  Yes Joseph Art, DO  clopidogrel (PLAVIX) 75 MG tablet Take 1 tablet (75 mg total) by mouth daily with breakfast. 11/06/15  Yes Joseph Art, DO  Dexlansoprazole (DEXILANT) 30 MG capsule Take 1 capsule (30 mg total) by mouth daily. 12/30/14  Yes Josalyn Funches, MD  digoxin (LANOXIN) 0.125 MG tablet Take 1 tablet (0.125 mg total) by mouth daily. 10/17/15  Yes Ripudeep Jenna Luo, MD  furosemide (LASIX) 40 MG tablet Take 1 tablet (40 mg total) by mouth daily. 02/25/15  Yes Dayna N Dunn, PA-C  glipiZIDE (GLUCOTROL) 10 MG tablet Take 1 tablet (10 mg total) by mouth 2 (two) times daily before a meal. Patient taking differently: Take 5 mg by mouth daily before breakfast.  11/06/15  Yes Joseph Art, DO  hydrALAZINE (APRESOLINE) 50 MG tablet Take 1 tablet (50 mg total) by mouth 3 (three) times daily. 11/06/15  Yes Jessica U Vann, DO  ipratropium-albuterol (DUONEB) 0.5-2.5 (3) MG/3ML SOLN Take 3 mLs by nebulization 2 (two) times daily. 10/17/15  Yes Ripudeep Jenna Luo, MD  isosorbide mononitrate (IMDUR) 30 MG 24 hr tablet Take 1 tablet (30 mg total) by mouth daily. 11/11/15  Yes Jaclyn Shaggy, MD  lisinopril (PRINIVIL,ZESTRIL)  20 MG tablet Take 1 tablet (20 mg total) by mouth daily. 11/06/15  Yes Joseph Art, DO  nitroGLYCERIN (NITROSTAT) 0.4 MG SL tablet Place 1 tablet (0.4 mg total) under the tongue every 5 (five) minutes as needed for chest pain (up to 3 doses). 02/25/15  Yes Dayna N Dunn, PA-C  potassium chloride SA (K-DUR,KLOR-CON) 20 MEQ tablet Take 1 tablet (20 mEq total) by mouth daily. 10/17/15  Yes Ripudeep Jenna Luo, MD  rivaroxaban (XARELTO) 20 MG TABS tablet Take 1 tablet (20 mg total) by mouth daily with supper. Patient taking differently: Take 20 mg by mouth daily.  02/25/15  Yes Dayna N Dunn, PA-C  spironolactone (ALDACTONE) 25 MG tablet Take 1 tablet (25 mg total) by mouth daily. 02/25/15  Yes Dayna N Dunn, PA-C  traMADol (ULTRAM) 50 MG tablet Take 1 tablet (50 mg total) by mouth every 6 (six) hours as needed for severe pain. 11/06/15  Yes Jessica U Vann, DO   BP 142/94 mmHg  Pulse 109  Temp(Src) 97.7 F (36.5 C)  Resp 22  SpO2 93% Physical Exam  Constitutional: He appears well-developed.  HENT:  Head: Atraumatic.  Neck: Neck supple.  Cardiovascular:  Tachycardia  Pulmonary/Chest: Effort normal.  Frequent coughing. No wheezes rales or rhonchi.  Abdominal: Soft. There is no tenderness.  Musculoskeletal: He exhibits no edema.  Neurological: He is alert.  Skin: Skin is warm.    ED Course  Procedures (including critical care time) Labs Review Labs Reviewed  COMPREHENSIVE METABOLIC PANEL - Abnormal; Notable for the following:    Glucose, Bld 260 (*)    BUN 22 (*)    Creatinine, Ser 1.26 (*)    Albumin 2.8 (*)    All other components within normal limits  TROPONIN I - Abnormal; Notable for the following:    Troponin I 0.46 (*)    All other components within normal limits  CBC WITH DIFFERENTIAL/PLATELET - Abnormal; Notable for the following:    WBC 17.1 (*)    Neutro Abs 15.1 (*)    All other components within normal limits  BRAIN NATRIURETIC PEPTIDE - Abnormal; Notable for the following:     B Natriuretic Peptide 1227.4 (*)    All other components within normal limits  DIGOXIN LEVEL - Abnormal; Notable for the following:    Digoxin Level <0.2 (*)    All other components within normal limits  URINE RAPID DRUG SCREEN, HOSP PERFORMED    Imaging Review Dg Chest 2 View  11/19/2015  CLINICAL DATA:  Shortness of breath. EXAM: CHEST  2 VIEW COMPARISON:  11/03/2015. FINDINGS: Normal sized heart. Interval mild patchy opacity in the right upper lobe and superior segment of the right lower lobe. Decreased prominence of the interstitial markings. The hila are more prominent, rounded and denser, specially on the right. Mild thoracic spine degenerative changes. IMPRESSION: 1. Interval probable mild pneumonia in the right upper lobe and superior segment of the right lower lobe. 2. Interval possible bilateral hilar adenopathy, greater than on the right. 3. Improving interstitial pulmonary edema. Electronically Signed   By: Beckie Salts M.D.   On: 11/19/2015 07:41   I have personally reviewed and evaluated these images and lab results as part of my medical decision-making.   EKG Interpretation   Date/Time:  Wednesday November 19 2015 07:04:24 EST Ventricular Rate:  108 PR Interval:  132 QRS Duration: 95 QT Interval:  352 QTC Calculation: 472 R Axis:   44 Text Interpretation:  Sinus tachycardia Ventricular premature complex  Biatrial enlargement Anterior infarct, old Nonspecific T abnormalities,  lateral leads baseline wander makes interpetation difficult. Confirmed by  Rubin Payor  MD, Harrold Donath [phone]) on 11/19/2015 7:20:50 AM      MDM   Final diagnoses:  HCAP (healthcare-associated pneumonia)    Patient presents with shortness of breath cough and productive sputum. Recent admission for CHF and non-STEMI. Found to have pneumonia on x-ray. White count is elevated. BNP however is also elevated. Likely there is a pneumonia with sputum production. Troponin is mildly elevated and also some dull  chest pain but states does not feel like his previous heart problems.  Troponin was very elevated around 2 weeks ago and this still could be trending down. Will admit to internal medicine.    Benjiman Core, MD 11/19/15 [phone]

## 2015-11-19 NOTE — Code Documentation (Signed)
8.5 ET tube 23@ lip.

## 2015-11-19 NOTE — Procedures (Signed)
Intubation Procedure Note Burgess Sheriff [id number] November 17, 1960  Procedure: Intubation Indications: Respiratory insufficiency  Procedure Details Consent: Risks of procedure as well as the alternatives and risks of each were explained to the (patient/caregiver).  Consent for procedure obtained. Time Out: Verified patient identification, verified procedure, site/side was marked, verified correct patient position, special equipment/implants available, medications/allergies/relevent history reviewed, required imaging and test results available.  Performed  Maximum sterile technique was used including gloves, gown, hand hygiene and mask.  MAC and 4    Evaluation Hemodynamic Status: BP stable throughout; O2 sats: stable throughout Patient's Current Condition: stable Complications: No apparent complications Patient did tolerate procedure well. Chest X-ray ordered to verify placement.  CXR: pending.   Nelda Bucks 11/19/2015  Frothy edema in airway  Mcarthur Rossetti. Tyson Alias, MD, FACP Pgr: [phone] [facility]

## 2015-11-19 NOTE — ED Notes (Signed)
pcxr done

## 2015-11-19 NOTE — ED Notes (Signed)
CBG 346  

## 2015-11-19 NOTE — Procedures (Addendum)
PCCM Bronchoscopy Procedure Note  Unable to get consent due to emergent nature.  Indication: Checking position of ETT  Post Procedure Diagnosis: ET tube too high  Condition pre procedure: Stable  Medications for procedure: Propofol, versed  Procedure description: The bronchoscope was introduced through the endotracheal tube and passed to the bilateral lungs to the level of the subsegmental bronchi throughout the tracheobronchial tree.  Airway exam revealed ET tube too high up above the carina. Advanced ET tube by 6 cm and rechecked position again. Now ~5 cm above the carina. Position at lip now is 29. No overt signs of bleed in airways.  Procedures performed: RLL bronchial washings.   Specimens sent: Cultures, cell count, cytology.   Condition post procedure:Stable  EBL: 0  Complications: None.  Chilton Greathouse MD [facility] Pager [phone] If no answer or after 3pm call: [phone] 11/19/2015, 4:21 PM

## 2015-11-20 ENCOUNTER — Inpatient Hospital Stay (HOSPITAL_COMMUNITY): Payer: Self-pay

## 2015-11-20 DIAGNOSIS — I10 Essential (primary) hypertension: Secondary | ICD-10-CM

## 2015-11-20 DIAGNOSIS — I5031 Acute diastolic (congestive) heart failure: Secondary | ICD-10-CM

## 2015-11-20 LAB — GLUCOSE, CAPILLARY
GLUCOSE-CAPILLARY: 132 mg/dL — AB (ref 65–99)
GLUCOSE-CAPILLARY: 119 mg/dL — AB (ref 65–99)
GLUCOSE-CAPILLARY: 200 mg/dL — AB (ref 65–99)
GLUCOSE-CAPILLARY: 130 mg/dL — AB (ref 65–99)
GLUCOSE-CAPILLARY: 187 mg/dL — AB (ref 65–99)
GLUCOSE-CAPILLARY: 275 mg/dL — AB (ref 65–99)
Glucose-Capillary: 127 mg/dL — ABNORMAL HIGH (ref 65–99)
Glucose-Capillary: 190 mg/dL — ABNORMAL HIGH (ref 65–99)
Glucose-Capillary: 135 mg/dL — ABNORMAL HIGH (ref 65–99)
Glucose-Capillary: 145 mg/dL — ABNORMAL HIGH (ref 65–99)
Glucose-Capillary: 181 mg/dL — ABNORMAL HIGH (ref 65–99)
Glucose-Capillary: 103 mg/dL — ABNORMAL HIGH (ref 65–99)
Glucose-Capillary: 137 mg/dL — ABNORMAL HIGH (ref 65–99)
Glucose-Capillary: 151 mg/dL — ABNORMAL HIGH (ref 65–99)
Glucose-Capillary: 197 mg/dL — ABNORMAL HIGH (ref 65–99)

## 2015-11-20 LAB — COMPREHENSIVE METABOLIC PANEL
ALK PHOS: 83 U/L (ref 38–126)
ALT: 22 U/L (ref 17–63)
AST: 15 U/L (ref 15–41)
Albumin: 2.6 g/dL — ABNORMAL LOW (ref 3.5–5.0)
Anion gap: 11 (ref 5–15)
BILIRUBIN TOTAL: 0.9 mg/dL (ref 0.3–1.2)
BUN: 18 mg/dL (ref 6–20)
CALCIUM: 8.8 mg/dL — AB (ref 8.9–10.3)
CO2: 27 mmol/L (ref 22–32)
CREATININE: 1.42 mg/dL — AB (ref 0.61–1.24)
Chloride: 106 mmol/L (ref 101–111)
GFR calc Af Amer: >60 mL/min (ref >60)
GFR, EST NON AFRICAN AMERICAN: 55 mL/min — AB (ref >60)
Glucose, Bld: 138 mg/dL — ABNORMAL HIGH (ref 65–99)
Potassium: 3.6 mmol/L (ref 3.5–5.1)
Sodium: 144 mmol/L (ref 135–145)
TOTAL PROTEIN: 6.7 g/dL (ref 6.5–8.1)

## 2015-11-20 LAB — CBC
HEMATOCRIT: 37.4 % — AB (ref 39.0–52.0)
Hemoglobin: 12.4 g/dL — ABNORMAL LOW (ref 13.0–17.0)
MCH: 28.4 pg (ref 26.0–34.0)
MCHC: 33.2 g/dL (ref 30.0–36.0)
MCV: 85.6 fL (ref 78.0–100.0)
Platelets: 219 10*3/uL (ref 150–400)
RBC: 4.37 MIL/uL (ref 4.22–5.81)
RDW: 14.3 % (ref 11.5–15.5)
WBC: 11.1 10*3/uL — AB (ref 4.0–10.5)

## 2015-11-20 LAB — PROCALCITONIN: PROCALCITONIN: 1.73 ng/mL

## 2015-11-20 LAB — HEMOGLOBIN A1C
Hgb A1c MFr Bld: 9.9 % — ABNORMAL HIGH (ref 4.8–5.6)
Mean Plasma Glucose: 237 mg/dL

## 2015-11-20 LAB — HIV ANTIBODY (ROUTINE TESTING W REFLEX): HIV Screen 4th Generation wRfx: NONREACTIVE

## 2015-11-20 LAB — MAGNESIUM: Magnesium: 1.9 mg/dL (ref 1.7–2.4)

## 2015-11-20 MED ORDER — INSULIN ASPART 100 UNIT/ML ~~LOC~~ SOLN
0.0000 [IU] | Freq: Three times a day (TID) | SUBCUTANEOUS | Status: DC
  Administered 2015-11-20 – 2015-11-21 (×3): 11 [IU] via SUBCUTANEOUS
  Administered 2015-11-22: 4 [IU] via SUBCUTANEOUS
  Administered 2015-11-22 – 2015-11-23 (×3): 7 [IU] via SUBCUTANEOUS

## 2015-11-20 MED ORDER — INSULIN ASPART 100 UNIT/ML ~~LOC~~ SOLN: 0.0000 [IU] | Freq: Three times a day (TID) | SUBCUTANEOUS | Status: DC

## 2015-11-20 MED ORDER — INSULIN GLARGINE 100 UNIT/ML ~~LOC~~ SOLN: 10.0000 [IU] | Freq: Every day | SUBCUTANEOUS | Status: DC

## 2015-11-20 MED ORDER — POTASSIUM CHLORIDE 10 MEQ/100ML IV SOLN
10.0000 meq | INTRAVENOUS | Status: DC
Start: 2015-11-20 — End: 2015-11-20
  Administered 2015-11-20: 10 meq via INTRAVENOUS
  Filled 2015-11-20: qty 100

## 2015-11-20 MED ORDER — TRAMADOL HCL 50 MG PO TABS
50.0000 mg | ORAL_TABLET | Freq: Four times a day (QID) | ORAL | Status: DC | PRN
  Administered 2015-11-21 – 2015-11-22 (×4): 50 mg via ORAL
  Filled 2015-11-20 (×4): qty 1

## 2015-11-20 MED ORDER — FUROSEMIDE 10 MG/ML IJ SOLN
40.0000 mg | Freq: Two times a day (BID) | INTRAMUSCULAR | Status: DC
  Administered 2015-11-20: 40 mg via INTRAVENOUS
  Filled 2015-11-20: qty 4

## 2015-11-20 MED ORDER — POTASSIUM CHLORIDE 20 MEQ/15ML (10%) PO SOLN
30.0000 meq | Freq: Once | ORAL | Status: AC
  Administered 2015-11-20: 30 meq via ORAL
  Filled 2015-11-20: qty 30

## 2015-11-20 MED ORDER — INSULIN GLARGINE 100 UNIT/ML ~~LOC~~ SOLN
10.0000 [IU] | Freq: Every day | SUBCUTANEOUS | Status: DC
  Administered 2015-11-20: 10 [IU] via SUBCUTANEOUS
  Filled 2015-11-20 (×2): qty 0.1

## 2015-11-20 MED ORDER — AMIODARONE LOAD VIA INFUSION
150.0000 mg | Freq: Once | INTRAVENOUS | Status: AC
  Administered 2015-11-20: 150 mg via INTRAVENOUS
  Filled 2015-11-20: qty 83.34

## 2015-11-20 MED ORDER — SODIUM CHLORIDE 0.9 % IV SOLN
3.0000 g | Freq: Three times a day (TID) | INTRAVENOUS | Status: DC
  Administered 2015-11-20 – 2015-11-21 (×3): 3 g via INTRAVENOUS
  Filled 2015-11-20 (×5): qty 3

## 2015-11-20 MED ORDER — INSULIN ASPART 100 UNIT/ML ~~LOC~~ SOLN
2.0000 [IU] | Freq: Three times a day (TID) | SUBCUTANEOUS | Status: DC
  Administered 2015-11-20: 4 [IU] via SUBCUTANEOUS

## 2015-11-20 MED ORDER — AMIODARONE HCL IN DEXTROSE 360-4.14 MG/200ML-% IV SOLN
60.0000 mg/h | INTRAVENOUS | Status: AC
  Administered 2015-11-20 (×2): 60 mg/h via INTRAVENOUS
  Filled 2015-11-20 (×2): qty 200

## 2015-11-20 MED ORDER — AMIODARONE HCL IN DEXTROSE 360-4.14 MG/200ML-% IV SOLN
30.0000 mg/h | INTRAVENOUS | Status: DC
  Administered 2015-11-20: 60 mg/h via INTRAVENOUS
  Administered 2015-11-20 – 2015-11-22 (×4): 30 mg/h via INTRAVENOUS
  Filled 2015-11-20 (×4): qty 200

## 2015-11-20 MED ORDER — INSULIN ASPART 100 UNIT/ML ~~LOC~~ SOLN: 2.0000 [IU] | SUBCUTANEOUS | Status: DC

## 2015-11-20 NOTE — Procedures (Signed)
Extubation Procedure Note  Patient Details:   Name: Sean Hinton DOB: Mar 01, 1961 MRN: [id number]   Airway Documentation:     Evaluation  O2 sats: stable throughout Complications: No apparent complications Patient did tolerate procedure well. Bilateral Breath Sounds: Rhonchi Suctioning: Airway Yes   Patient extubated to 2L nasal cannula per MD order.  Positive cuff leak noted.  No evidence of stridor.  Sats currently 97%.  Vitals are stable.  Patient able to speak post extubation.  No apparent complications.    Durwin Glaze 11/20/2015, 9:57 AM

## 2015-11-20 NOTE — Progress Notes (Signed)
Inpatient Diabetes Program Recommendations  AACE/ADA: New Consensus Statement on Inpatient Glycemic Control (2015)  Target Ranges:  Prepandial:   less than 140 mg/dL      Peak postprandial:   less than 180 mg/dL (1-2 hours)      Critically ill patients:  140 - 180 mg/dL   Review of Glycemic Control:  Results for SALVADORE, VALVANO (MRN [id number]) as of 11/20/2015 11:03  Ref. Range 11/20/2015 03:37 11/20/2015 04:41 11/20/2015 05:36 11/20/2015 06:40  Glucose-Capillary Latest Ref Range: 65-99 mg/dL 283 (H) 151 (H) 761 (H) 200 (H)  Results for JAMIERE, GULAS (MRN [id number]) as of 11/20/2015 11:03  Ref. Range 11/19/2015 12:03  Hemoglobin A1C Latest Ref Range: 4.8-5.6 % 9.9 (H)   Diabetes history: Type 2 diabetes Outpatient Diabetes medications: Glucotrol 5 mg daily Current orders for Inpatient glycemic control:  Lantus 10 units daily (prior to d/c of insulin drip), Novolog 2-4-6 q 4 hours  Inpatient Diabetes Program Recommendations:    Note patient is transitioning off insulin drip.  Lantus 10 units started today.  Based on A1C it appears that patient does need changes in home medications prior to discharge.  Will follow.  Thanks, Beryl Meager, RN, BC-ADM Inpatient Diabetes Coordinator Pager [phone] (8a-5p)

## 2015-11-20 NOTE — Care Management Note (Addendum)
Case Management Note  Patient Details  Name: Sean Hinton MRN: [id number] Date of Birth: 1960-11-08  Subjective/Objective:          Adm w resp failure, vent          Action/Plan: lives w fam   Expected Discharge Date:                  Expected Discharge Plan:     In-House Referral:     Discharge planning Services     Post Acute Care Choice:    Choice offered to:     DME Arranged:    DME Agency:     HH Arranged:    HH Agency:     Status of Service:     Medicare Important Message Given:    Date Medicare IM Given:    Medicare IM give by:    Date Additional Medicare IM Given:    Additional Medicare Important Message give by:     If discussed at Long Length of Stay Meetings, dates discussed:    Additional Comments: ur review done, chart states followed by transitional care clinic at [facility] wellness [facility]  Hanley Hays, RN 11/20/2015, 7:27 AM

## 2015-11-20 NOTE — Progress Notes (Signed)
SLP Cancellation Note  Patient Details Name: Sean Hinton MRN: [id number] DOB: 23-Apr-1961   Cancelled treatment:       Reason Eval/Treat Not Completed: Other (comment). Spoke with RN regarding readiness for swallow evaluation. Per RN, patient provided with ice chips and water at bedside without difficulty, po diet initiated and patient without complaints or overt indication of dysphagia/aspiration. Reviewed chart and noted only brief (less than 24 hour intubation). Per RN, order to be cancelled. SLP in agreement. Please contact however should concerns arise.   Ferdinand Lango MA, CCC-SLP [phone]    Ferdinand Lango Meryl 11/20/2015, 2:52 PM

## 2015-11-20 NOTE — Progress Notes (Signed)
Patient started getting tachy fluctuating 110-170s , EKG obtained, ST with BP 125/82, called Elink doc orders to check Magnesium, will continue to monitor closely.

## 2015-11-20 NOTE — Progress Notes (Signed)
eLink Physician-Brief Progress Note Patient Name: Landrum Carbonell DOB: 1961/07/21 MRN: [id number]   Date of Service  11/20/2015  HPI/Events of Note  Called for acute tachyarrythmia, in and out of afib with RVR HR as high as 180  eICU Interventions  Will start amiodorone bolus and infusion, check MaG level        Erin Fulling 11/20/2015, 6:32 AM

## 2015-11-20 NOTE — Progress Notes (Signed)
eLink Physician-Brief Progress Note Patient Name: Sean Hinton DOB: August 06, 1961 MRN: [id number]   Date of Service  11/20/2015  HPI/Events of Note  Hyperglycemia on standard ssi  eICU Interventions  Change to resistant scale     Intervention Category Major Interventions: Hyperglycemia - active titration of insulin therapy  Sandrea Hughs 11/20/2015, 5:09 PM

## 2015-11-20 NOTE — Progress Notes (Signed)
Post extubation patient states that his "crown on left front tooth is missing and his tooth feels rough." Left front tooth is intact. No bleeding is noted. Pt states he has no mouth pain at this time. Will continue to monitor.

## 2015-11-20 NOTE — Progress Notes (Signed)
ANTIBIOTIC CONSULT NOTE - INITIAL  Pharmacy Consult for Unaysn Indication: rule out pneumonia  No Known Allergies  Patient Measurements: Height: 6' (182.9 cm) Weight: 155 lb 3.3 oz (70.4 kg) IBW/kg (Calculated) : 77.6 Adjusted Body Weight:   Vital Signs: Temp: 99.7 F (37.6 C) (01/26 0830) Temp Source: Core (Comment) (01/26 0400) BP: 140/110 mmHg (01/26 1046) Pulse Rate: 102 (01/26 1046) Intake/Output from previous day: 01/25 0701 - 01/26 0700 In: 958.7 [I.V.:658.7; IV Piggyback:300] Out: 3820 [Urine:3820] Intake/Output from this shift: Total I/O In: -  Out: 750 [Urine:400; Emesis/NG output:350]  Labs:  Recent Labs  11/17/15 1106 11/19/15 0804 11/20/15 0338  WBC 6.6 17.1* 11.1*  HGB 12.1* 13.7 12.4*  PLT 257 222 219  CREATININE 1.21 1.26* 1.42*   Estimated Creatinine Clearance: 60.6 mL/min (by C-G formula based on Cr of 1.42). No results for input(s): VANCOTROUGH, VANCOPEAK, VANCORANDOM, GENTTROUGH, GENTPEAK, GENTRANDOM, TOBRATROUGH, TOBRAPEAK, TOBRARND, AMIKACINPEAK, AMIKACINTROU, AMIKACIN in the last 72 hours.   Microbiology: Recent Results (from the past 720 hour(s))  Blood culture (routine x 2)     Status: None   Collection Time: 11/03/15  1:06 AM  Result Value Ref Range Status   Specimen Description BLOOD LEFT UPPER ARM  Final   Special Requests BOTTLES DRAWN AEROBIC AND ANAEROBIC 5CC   Final   Culture NO GROWTH 5 DAYS  Final   Report Status 11/08/2015 FINAL  Final  Blood culture (routine x 2)     Status: None   Collection Time: 11/03/15  1:13 AM  Result Value Ref Range Status   Specimen Description BLOOD LEFT HAND  Final   Special Requests BOTTLES DRAWN AEROBIC AND ANAEROBIC 5CC  Final   Culture NO GROWTH 5 DAYS  Final   Report Status 11/08/2015 FINAL  Final  MRSA PCR Screening     Status: None   Collection Time: 11/03/15  5:14 PM  Result Value Ref Range Status   MRSA by PCR NEGATIVE NEGATIVE Final    Comment:        The GeneXpert MRSA Assay  (FDA approved for NASAL specimens only), is one component of a comprehensive MRSA colonization surveillance program. It is not intended to diagnose MRSA infection nor to guide or monitor treatment for MRSA infections.   MRSA PCR Screening     Status: None   Collection Time: 11/19/15  8:03 PM  Result Value Ref Range Status   MRSA by PCR NEGATIVE NEGATIVE Final    Comment:        The GeneXpert MRSA Assay (FDA approved for NASAL specimens only), is one component of a comprehensive MRSA colonization surveillance program. It is not intended to diagnose MRSA infection nor to guide or monitor treatment for MRSA infections.     Medical History: Past Medical History  Diagnosis Date  . Diabetes mellitus with complication (HCC)   . Hypertension   . Syncope     a. 02/2015 - felt 2/2 rapid AF/AFL.  Marland Kitchen Atrial fibrillation and flutter (HCC)     a. Dx 02/2015 - placed on Xarelto; b. 09/2015 recurrence in setting of PNA.  . Ischemic cardiomyopathy     a. 09/2015 Echo: EF 35-40%.  Marland Kitchen CAD (coronary artery disease)     a. Dx 02/2015 - 2V CAD with CTO LAD, diffuse dz in PDA, OM - med rx.  . Hypertensive heart disease   . Hyperlipidemia   . Mitral regurgitation     a. Mod by echo 02/2015,  . Tobacco abuse   .  Chronic systolic CHF (congestive heart failure) (HCC)     a. 09/2015 Echo: EF 35-40%, sev apical/antlat, apical HK, mild MR, mildly dil LA.  Marland Kitchen ARF (acute respiratory failure) (HCC)   . Pneumonia 09/2015  . GERD (gastroesophageal reflux disease)   . Headache   . Arthritis     ra  . NSTEMI (non-ST elevated myocardial infarction) (HCC) 10/2015    Medications:  Anti-infectives    Start     Dose/Rate Route Frequency Ordered Stop   11/20/15 1430  Ampicillin-Sulbactam (UNASYN) 3 g in sodium chloride 0.9 % 100 mL IVPB     3 g 100 mL/hr over 60 Minutes Intravenous Every 8 hours 11/20/15 1012     11/19/15 2200  vancomycin (VANCOCIN) IVPB 1000 mg/200 mL premix  Status:  Discontinued      1,000 mg 200 mL/hr over 60 Minutes Intravenous Every 12 hours 11/19/15 0911 11/20/15 0856   11/19/15 1500  ceFEPIme (MAXIPIME) 1 g in dextrose 5 % 50 mL IVPB  Status:  Discontinued     1 g 100 mL/hr over 30 Minutes Intravenous 3 times per day 11/19/15 1102 11/20/15 0942   11/19/15 0815  vancomycin (VANCOCIN) 1,500 mg in sodium chloride 0.9 % 500 mL IVPB     1,500 mg 250 mL/hr over 120 Minutes Intravenous  Once 11/19/15 0801 11/19/15 1531   11/19/15 0800  piperacillin-tazobactam (ZOSYN) IVPB 3.375 g     3.375 g 100 mL/hr over 30 Minutes Intravenous  Once 11/19/15 0756 11/19/15 5170     Assessment: 54 yom presented to the ED with SOB, concern for potential aspiration. Vanc/Cefepime D#2  - Tmax 100.9, WBC 11.1, PCT 0.89 > 1.73 - recent admission 1/8  Unasyn 1/26 >>  Vanc 1/25>>1/26  Cefepime 1/25>>1/26  Zosyn x 1 1/25   1/25 BCx x2 >>  1/25 MRSA pcr neg  Plan:  - d/c vanc/cefepime - Unasyn 3g q8h - F/u renal fxn, C&S, clinical status  - will likely D/C abx tomorrow   Arcola Jansky, PharmD Clinical Pharmacy Resident Pager: [phone] 11/20/2015,11:01 AM

## 2015-11-20 NOTE — Progress Notes (Signed)
    Subjective:  Intubated; sedated   Objective:  Filed Vitals:   11/20/15 0658 11/20/15 0700 11/20/15 0722 11/20/15 0727  BP: 95/70 102/78 104/71   Pulse:   134   Temp: 99.7 F (37.6 C) 99.7 F (37.6 C)    TempSrc:      Resp: 18 18 18    Height:      Weight:      SpO2:   100% 100%    Intake/Output from previous day:  Intake/Output Summary (Last 24 hours) at 11/20/15 0733 Last data filed at 11/20/15 0700  Gross per 24 hour  Intake  958.7 ml  Output   3820 ml  Net -2861.3 ml    Physical Exam: Physical exam: Well-developed well-nourished intubated Skin is warm and dry.  HEENT is normal.  Neck is supple.  Chest is clear to auscultation with normal expansion.  Cardiovascular exam is regular rate and rhythm.  Abdominal exam nontender or distended. No masses palpated. Extremities show no edema. neuro grossly intact    Lab Results: Basic Metabolic Panel:  Recent Labs  11/22/15 0804 11/20/15 0338  NA 140 144  K 3.6 3.6  CL 104 106  CO2 23 27  GLUCOSE 260* 138*  BUN 22* 18  CREATININE 1.26* 1.42*  CALCIUM 9.1 8.8*   CBC:  Recent Labs  11/19/15 0804 11/20/15 0338  WBC 17.1* 11.1*  NEUTROABS 15.1*  --   HGB 13.7 12.4*  HCT 40.1 37.4*  MCV 85.0 85.6  PLT 222 219   Cardiac Enzymes:  Recent Labs  11/19/15 1203 11/19/15 1356 11/19/15 1825  TROPONINI 0.41* 0.43* 0.41*     Assessment/Plan:  1 acute pulmonary edema/respiratory failure-Possibly precipitated by cocaine use. Plan to continue diuresis but change lasix to 40 BID. Follow renal function. Continue hydralazine, nitrates and ACEI; hold spironolactone. Further med adjustment after extubated. Antibiotics for possible pneumonia per CCM. 2 Elevated troponin-there are no diagnostic ST changes on his electrocardiogram. Trend not consistent with acute coronary syndrome. No plans for further ischemia eval. 3 status post recent PCI-continue aspirin and Plavix. Continue statin. 4  hypertension-Continue present meds; beta blocker held due to recent cocaine. 5 ischemic cardiomyopathy-hold beta blocker as described above. Continue ACEI, hydralazine and nitrates 6 cocaine use-Patient previously counseled on discontinuing. 7 paroxysmal atrial fibrillation-patient with PAF last PM; continue IV amiodarone for now. Hold xarelto given recent hemoptysis. 8 MR-severe on echo; will need fu studies in the future.  11/21/15 11/20/2015, 7:33 AM

## 2015-11-20 NOTE — Progress Notes (Addendum)
PULMONARY / CRITICAL CARE MEDICINE   Name: Sean Hinton MRN: [id number] DOB: 1961/08/13    ADMISSION DATE:  11/19/2015 CONSULTATION DATE:  11/19/15  REFERRING MD :  [facility]  PRIMARY SERVICE: PCCM   BRIEF PATIENT DESCRIPTION: 55 yo M with cocaine abuse and recent hospitalization for NSTEMI who presented with hemotypsis and acute respiratory failure requiring emergent intubation.    SIGNIFICANT EVENTS / STUDIES:  1/25 Admitted for acute respiratory failure requiring emergent intubation 1/26 Started on amio drip for afib with RVR   LINES / TUBES: 1/25 ETT >> 1/25 OG >> 1/25 Foley >>  CULTURES: 1/25 MRSA >> neg 1/25 Blood >> 1/25 Sputum (not yet collected) >>  1/25 Strep pneumo >> neg 1/25 Legionella >>  ANTIBIOTICS: 1/25 Vanc >> 1/25 Zosyn >>1/25 (1 dose) 1/25 Cefepime  >>   SUBJECTIVE: Pt with low-grade fever overnight up to 100.6 and started on vanc and cefepime. This morning tachycardic to 130's with possible afib on monitor. Pt started on amio this AM. Currently with SpO2 of 100% on FIO2 40% and PEEP 5 with RR 18.   VITAL SIGNS: Temp:  [98.2 F (36.8 C)-100.9 F (38.3 C)] 99.7 F (37.6 C) (01/26 0700) Pulse Rate:  [36-134] 134 (01/26 0722) Resp:  [0-41] 18 (01/26 0722) BP: (54-162)/(32-121) 104/71 mmHg (01/26 0722) SpO2:  [85 %-100 %] 100 % (01/26 0727) FiO2 (%):  [35 %-100 %] 40 % (01/26 0727) Weight:  [155 lb 3.3 oz (70.4 kg)-161 lb 2.5 oz (73.1 kg)] 155 lb 3.3 oz (70.4 kg) (01/26 0500) HEMODYNAMICS:   VENTILATOR SETTINGS: Vent Mode:  [-] PRVC FiO2 (%):  [35 %-100 %] 40 % Set Rate:  [18 bmp] 18 bmp Vt Set:  [590 mL-630 mL] 630 mL PEEP:  [5 cmH20] 5 cmH20 Plateau Pressure:  [19 cmH20-23 cmH20] 19 cmH20 INTAKE / OUTPUT: Intake/Output      01/25 0701 - 01/26 0700 01/26 0701 - 01/27 0700   I.V. (mL/kg) 658.7 (9.4)    IV Piggyback 300    Total Intake(mL/kg) 958.7 (13.6)    Urine (mL/kg/hr) 3820 (2.3)    Total Output 3820     Net -2861.3             PHYSICAL EXAMINATION: General:  Sedated on vent Neuro:  RAAS -2 on propofol  HEENT:  Normocephalic, OG in place Cardiovascular:  Tachycardic with irregularly irregular rhythm  Lungs:  Ronchi and coarse in anterior fields Abdomen:  Soft, non-tender, non-distended, normal BS Musculoskeletal:  No LE edema Skin:  No rashes  LABS:  CBC  Recent Labs Lab 11/17/15 1106 11/19/15 0804 11/20/15 0338  WBC 6.6 17.1* 11.1*  HGB 12.1* 13.7 12.4*  HCT 37.4* 40.1 37.4*  PLT 257 222 219   Coag's No results for input(s): APTT, INR in the last 168 hours. BMET  Recent Labs Lab 11/17/15 1106 11/19/15 0804 11/20/15 0338  NA 141 140 144  K 4.4 3.6 3.6  CL 108 104 106  CO2 22 23 27   BUN 25 22* 18  CREATININE 1.21 1.26* 1.42*  GLUCOSE 157* 260* 138*   Electrolytes  Recent Labs Lab 11/17/15 1106 11/19/15 0804 11/20/15 0338  CALCIUM 8.8 9.1 8.8*   Sepsis Markers  Recent Labs Lab 11/19/15 1203 11/19/15 1356 11/20/15 0338  PROCALCITON 0.50 0.89 1.73   ABG  Recent Labs Lab 11/19/15 1228 11/19/15 1448  PHART 7.354 7.395  PCO2ART 46.0* 47.3*  PO2ART 206.0* 324.0*   Liver Enzymes  Recent Labs Lab 11/19/15 0804 11/20/15 2111  AST 21 15  ALT 23 22  ALKPHOS 102 83  BILITOT 0.7 0.9  ALBUMIN 2.8* 2.6*   Cardiac Enzymes  Recent Labs Lab 11/19/15 1203 11/19/15 1356 11/19/15 1825  TROPONINI 0.41* 0.43* 0.41*   Glucose  Recent Labs Lab 11/20/15 0146 11/20/15 0242 11/20/15 0337 11/20/15 0441 11/20/15 0536 11/20/15 0640  GLUCAP 132* 119* 145* 135* 181* 200*    Imaging Dg Chest 2 View  11/19/2015  CLINICAL DATA:  Shortness of breath. EXAM: CHEST  2 VIEW COMPARISON:  11/03/2015. FINDINGS: Normal sized heart. Interval mild patchy opacity in the right upper lobe and superior segment of the right lower lobe. Decreased prominence of the interstitial markings. The hila are more prominent, rounded and denser, specially on the right. Mild thoracic spine  degenerative changes. IMPRESSION: 1. Interval probable mild pneumonia in the right upper lobe and superior segment of the right lower lobe. 2. Interval possible bilateral hilar adenopathy, greater than on the right. 3. Improving interstitial pulmonary edema. Electronically Signed   By: Beckie Salts M.D.   On: 11/19/2015 07:41   Dg Chest Port 1 View  11/20/2015  CLINICAL DATA:  Acute respiratory failure. EXAM: PORTABLE CHEST 1 VIEW COMPARISON:  11/19/2015. FINDINGS: Endotracheal tube and NG tube in stable position. Mediastinum and hilar structures are normal. Heart size normal. Interval near complete clearing of right lung infiltrate. No pleural effusion or pneumothorax. IMPRESSION: 1. Endotracheal tube and NG tube in stable position. 2. Interim near complete clearing of right lung infiltrate. Electronically Signed   By: Maisie Fus  Register   On: 11/20/2015 07:09   Dg Chest Port 1 View  11/19/2015  CLINICAL DATA:  Check endotracheal tube position EXAM: PORTABLE CHEST 1 VIEW COMPARISON:  11/19/2015 . FINDINGS: Endotracheal tube has been advanced and now lies 5.2 cm above the carina in satisfactory position. Improved aeration is noted within the lungs bilaterally but particularly on the left. Some residual changes in the right lung are noted. Cardiac shadow is within normal limits. No other focal abnormality is seen. IMPRESSION: Improved endotracheal tube position. Improved aeration on the left with persistent airspace disease in the right lung. Electronically Signed   By: Alcide Clever M.D.   On: 11/19/2015 16:27   Dg Chest Port 1 View  11/19/2015  CLINICAL DATA:  Respiratory distress.  Endotracheal tube placement. EXAM: PORTABLE CHEST 1 VIEW COMPARISON:  Earlier film, same date. FINDINGS: The endotracheal tube is above the thoracic inlet, 13 cm above the carina. This should be advanced at least 8 cm. Persistent bilateral airspace process. No pleural effusion or pneumothorax. IMPRESSION: The endotracheal tube is  13 cm above the carina and should be advanced at least 8 cm. Persistent bilateral airspace process. Electronically Signed   By: Rudie Meyer M.D.   On: 11/19/2015 13:39   Dg Abd Portable 1v  11/19/2015  CLINICAL DATA:  Orogastric tube placement. Congestive heart failure and atrial fibrillation. Myocardial infarct. EXAM: PORTABLE ABDOMEN - 1 VIEW COMPARISON:  None. FINDINGS: Orogastric tube is seen with tip overlying the distal gastric antrum. No evidence of dilated bowel loops. IMPRESSION: Orogastric tube tip overlies the distal gastric antrum. Electronically Signed   By: Myles Rosenthal M.D.   On: 11/19/2015 18:32     CXR: See above  ASSESSMENT / PLAN:  PULMONARY A: Acute hypercapnic respiratory failure requiring mechanical ventilation Hemoptysis - resolved  Possible HCAP with right lung infiltrate - improved Moderate pulmonary hypertension  Tobacco abuse  P:   Full vent support  Wean FIO2/PEEP  to maintain SpO2 >92% Albuterol neb Q 3 hr PRN Dunebs duonebs BID Monitor CXR   See ID SBT weaning cpap 5 ps 5   CARDIOVASCULAR A: Afib with RVR - at home on xarelto  History of aflutter Elevated troponin in setting of demand ischemia  Acute on chronic CHF EF 40-45% on 11/19/15 net -3.2 L  CAD with recent NSTEMI Coronary spasm in setting of cocaine abuse  Mitral regurgitation  Hypertension  Hyperlipidemia  P:  Amio drip Wean nitro drip, edema resolved dc  Hold AC for now Aspirin 81 mg daily and plavix 75 mg daily   IV lasix 40 mg BID, hold Hydralazine 50 mg TID and digoxin 0.125 mg daily Hold home lisinopril 20 mg daily and carvedilol 12.5 mg BID Lipitor 80 mg daily  Counsel on cocaine abuse  RENAL A:  Non oliguric AKI - Cr 1.42 Hypokalemia  P:   Monitor BMP Daily weights and strict I & O's  Avoid nephrotoxins  Hold home lisinopril 20 mg daily  Replete K  GASTROINTESTINAL A: Nutrition  GERD Constipation P:   IV protonix 40 mg daily Tube feeds if not  extubated Ducolax suppository daily PRN  HEMATOLOGIC A:  Chronic AC in setting of PAF on xarelto- Hg stable  Hemoptysis - resolved DVT Ppx P:  Monitor CBC  Transfuse if Hg<7 Hold AC for now  SQ heparin TID   INFECTIOUS A:  Possible HCAP with  right lung infiltrate - improved - edema Leukocytosis - improved  Slight PCT elevation in setting rise crt P:   Day 2 vanc and cefepime, consider d'c vanc Monitor CBC and fever curve Monitor procalcitonin done, unimpressed  ENDOCRINE A:  Hyperglycemia Non insulin dependent Type 2 DM - A1c 9.9 on 11/19/15 P:   D/c Insulin drip Start lantus 10 U daily  SSI Q 4 hr  Goal CBG <180 Hold home glipizide    NEUROLOGIC A:  Sedation on vent Anxiety  P:   Propofol infusion  Fentanyl and versed PRN WUA daily RAAS goal -2 Xanax 0.25 mg BID PRN   Otis Brace MD PGY-3 IMTS Pager [phone]  11/20/2015, 7:37 AM   STAFF NOTE: Cindi Carbon, MD FACP have personally reviewed patient's available data, including medical history, events of note, physical examination and test results as part of my evaluation. I have discussed with resident/NP and other care providers such as pharmacist, RN and RRT. In addition, I personally evaluated patient and elicited key findings of: pcxr improved, rochi resolved, pulm edema resolved, slight bump in renal fxn, weaning to goal extubation, cpap 5 ps 5 goal 30 min, even balance goals , saline kvo, unimpressed pna, dc vanc, likely to dc cefepime in am , restart anti plat as no further bloody secretions, I feel he is LIKELY NOT a candidate for systemic anticoagulation as such high risk bleed with cocaine, fall etc, feed if not extubated, WUA, dc prop, goal to dc uinsulin drip The patient is critically ill with multiple organ systems failure and requires high complexity decision making for assessment and support, frequent evaluation and titration of therapies, application of advanced monitoring technologies and  extensive interpretation of multiple databases.   Critical Care Time devoted to patient care services described in this note is35 Minutes. This time reflects time of care of this signee: Rory Percy, MD FACP. This critical care time does not reflect procedure time, or teaching time or supervisory time of PA/NP/Med student/Med Resident etc but could involve care discussion time. Rest  per NP/medical resident whose note is outlined above and that I agree with   Mcarthur Rossetti. Tyson Alias, MD, FACP Pgr: [phone] [facility] 11/20/2015 8:52 AM

## 2015-11-21 ENCOUNTER — Inpatient Hospital Stay (HOSPITAL_COMMUNITY): Payer: Self-pay

## 2015-11-21 ENCOUNTER — Telehealth: Payer: Self-pay | Admitting: *Deleted

## 2015-11-21 DIAGNOSIS — I480 Paroxysmal atrial fibrillation: Secondary | ICD-10-CM

## 2015-11-21 DIAGNOSIS — I5020 Unspecified systolic (congestive) heart failure: Secondary | ICD-10-CM

## 2015-11-21 LAB — LEGIONELLA ANTIGEN, URINE

## 2015-11-21 LAB — CBC WITH DIFFERENTIAL/PLATELET
BASOS ABS: 0 10*3/uL (ref 0.0–0.1)
BASOS PCT: 0 %
Eosinophils Absolute: 0.3 10*3/uL (ref 0.0–0.7)
Eosinophils Relative: 3 %
HEMATOCRIT: 39.5 % (ref 39.0–52.0)
Hemoglobin: 13.3 g/dL (ref 13.0–17.0)
LYMPHS PCT: 29 %
Lymphs Abs: 2.8 10*3/uL (ref 0.7–4.0)
MCH: 28.7 pg (ref 26.0–34.0)
MCHC: 33.7 g/dL (ref 30.0–36.0)
MCV: 85.3 fL (ref 78.0–100.0)
MONO ABS: 0.8 10*3/uL (ref 0.1–1.0)
Monocytes Relative: 8 %
NEUTROS ABS: 5.9 10*3/uL (ref 1.7–7.7)
NEUTROS PCT: 60 %
Platelets: 197 10*3/uL (ref 150–400)
RBC: 4.63 MIL/uL (ref 4.22–5.81)
RDW: 14.6 % (ref 11.5–15.5)
WBC: 9.8 10*3/uL (ref 4.0–10.5)

## 2015-11-21 LAB — GLUCOSE, CAPILLARY
GLUCOSE-CAPILLARY: 174 mg/dL — AB (ref 65–99)
GLUCOSE-CAPILLARY: 262 mg/dL — AB (ref 65–99)
Glucose-Capillary: 258 mg/dL — ABNORMAL HIGH (ref 65–99)
Glucose-Capillary: 257 mg/dL — ABNORMAL HIGH (ref 65–99)
Glucose-Capillary: 90 mg/dL (ref 65–99)

## 2015-11-21 LAB — BASIC METABOLIC PANEL
ANION GAP: 10 (ref 5–15)
BUN: 20 mg/dL (ref 6–20)
CHLORIDE: 105 mmol/L (ref 101–111)
CO2: 25 mmol/L (ref 22–32)
Calcium: 8.4 mg/dL — ABNORMAL LOW (ref 8.9–10.3)
Creatinine, Ser: 1.52 mg/dL — ABNORMAL HIGH (ref 0.61–1.24)
GFR, EST AFRICAN AMERICAN: 59 mL/min — AB (ref >60)
GFR, EST NON AFRICAN AMERICAN: 51 mL/min — AB (ref >60)
Glucose, Bld: 266 mg/dL — ABNORMAL HIGH (ref 65–99)
POTASSIUM: 3.2 mmol/L — AB (ref 3.5–5.1)
SODIUM: 140 mmol/L (ref 135–145)

## 2015-11-21 LAB — PHOSPHORUS: PHOSPHORUS: 3.8 mg/dL (ref 2.5–4.6)

## 2015-11-21 LAB — MAGNESIUM: Magnesium: 1.9 mg/dL (ref 1.7–2.4)

## 2015-11-21 LAB — PROCALCITONIN: PROCALCITONIN: 1.03 ng/mL

## 2015-11-21 MED ORDER — HYDRALAZINE HCL 25 MG PO TABS
25.0000 mg | ORAL_TABLET | Freq: Three times a day (TID) | ORAL | Status: DC
  Administered 2015-11-21 – 2015-11-23 (×8): 25 mg via ORAL
  Filled 2015-11-21 (×8): qty 1

## 2015-11-21 MED ORDER — DIGOXIN 125 MCG PO TABS
0.0625 mg | ORAL_TABLET | Freq: Every day | ORAL | Status: DC
Start: 2015-11-21 — End: 2015-11-23
  Administered 2015-11-21 – 2015-11-23 (×3): 0.0625 mg via ORAL
  Filled 2015-11-21 (×3): qty 1

## 2015-11-21 MED ORDER — INSULIN ASPART 100 UNIT/ML ~~LOC~~ SOLN
3.0000 [IU] | Freq: Three times a day (TID) | SUBCUTANEOUS | Status: DC
  Administered 2015-11-21 – 2015-11-23 (×5): 3 [IU] via SUBCUTANEOUS

## 2015-11-21 MED ORDER — POTASSIUM CHLORIDE CRYS ER 20 MEQ PO TBCR
30.0000 meq | EXTENDED_RELEASE_TABLET | ORAL | Status: AC
  Administered 2015-11-21 (×2): 30 meq via ORAL
  Filled 2015-11-21 (×2): qty 1

## 2015-11-21 MED ORDER — ISOSORBIDE MONONITRATE ER 30 MG PO TB24
30.0000 mg | ORAL_TABLET | Freq: Every day | ORAL | Status: DC
  Administered 2015-11-21 – 2015-11-23 (×3): 30 mg via ORAL
  Filled 2015-11-21 (×3): qty 1

## 2015-11-21 MED ORDER — INSULIN GLARGINE 100 UNIT/ML ~~LOC~~ SOLN
16.0000 [IU] | Freq: Every day | SUBCUTANEOUS | Status: DC
  Administered 2015-11-21 – 2015-11-22 (×2): 16 [IU] via SUBCUTANEOUS
  Filled 2015-11-21 (×3): qty 0.16

## 2015-11-21 MED ORDER — PANTOPRAZOLE SODIUM 40 MG PO TBEC
40.0000 mg | DELAYED_RELEASE_TABLET | Freq: Every day | ORAL | Status: DC
  Administered 2015-11-21 – 2015-11-23 (×3): 40 mg via ORAL
  Filled 2015-11-21 (×3): qty 1

## 2015-11-21 NOTE — Hospital Discharge Follow-Up (Signed)
[facility] at [place]:  Patient known to the [facility] [place]. Met with patient at bedside to discuss plans for follow-up after discharge, and patient is agreeable to continuing medical follow-up with the [facility]. Patient aware of his upcoming appointment on 11/25/15 at [facility] with Dr. Jarold Song. Appointment time on AVS.  No transportation barriers noted. Patient aware he will receive a post-discharge follow-up phone call from Transitional Care Case Manager 24-48 hours after discharge.  In addition, patient aware of documentation needed to complete [employer] application. He indicated he plans to follow-up with Development worker, community at [facility] and [facility] after discharge. Elissa Hefty, RN CM updated. Will continue to follow patient's clinical progress closely.

## 2015-11-21 NOTE — Progress Notes (Signed)
CRITICAL VALUE ALERT  Critical value received:  Potassium 3.2  Date of notification:  11/21/15  Time of notification:  5:30  Critical value read back:No. Lab didn't call.  Nurse who received alert:  Nelly Laurence, RN  MD notified (1st page):  Pola Corn

## 2015-11-21 NOTE — Progress Notes (Signed)
Inpatient Diabetes Program Recommendations  AACE/ADA: New Consensus Statement on Inpatient Glycemic Control (2015)  Target Ranges:  Prepandial:   less than 140 mg/dL      Peak postprandial:   less than 180 mg/dL (1-2 hours)      Critically ill patients:  140 - 180 mg/dL   Review of Glycemic Control:   Results for ADAN, BAEHR (MRN [id number]) as of 11/21/2015 09:51  Ref. Range 11/21/2015 03:33  Glucose Latest Ref Range: 65-99 mg/dL 220 (H)   Diabetes history: Type 2 diabetes Outpatient Diabetes medications: Glucotrol 5 mg with breakfast Current orders for Inpatient glycemic control:  Lantus 10 units daily at HS, Novolog 3 units tid with meals, Novolog resistant tid with meals Inpatient Diabetes Program Recommendations:    Consider increasing Lantus to 16 units daily.    Thanks, Beryl Meager, RN, BC-ADM Inpatient Diabetes Coordinator Pager [phone] (8a-5p)

## 2015-11-21 NOTE — Progress Notes (Signed)
[facility] ADULT ICU REPLACEMENT PROTOCOL FOR AM LAB REPLACEMENT ONLY  The patient does apply for the [facility] Adult ICU Electrolyte Replacment Protocol based on the criteria listed below:   1. Is GFR >/= 40 ml/min? Yes.    Patient's GFR today is 51 2. Is urine output >/= 0.5 ml/kg/hr for the last 6 hours? Yes.   Patient's UOP is 0.55 ml/kg/hr 3. Is BUN < 60 mg/dL? Yes.    Patient's BUN today is 20 4. Abnormal electrolyte  K 3.2 5. Ordered repletion with: per protocol 6. If a panic level lab has been reported, has the CCM MD in charge been notified? Yes.  .   Physician:  E Deterding  [place] 11/21/2015 5:43 AM

## 2015-11-21 NOTE — Telephone Encounter (Signed)
Signed order for cardiac rehab faxed to [facility] 11/13/15.

## 2015-11-21 NOTE — Progress Notes (Signed)
Patient transferred via wheelchair to unit 2W.

## 2015-11-21 NOTE — Progress Notes (Signed)
Pt received from 2H. Pt oriented to room and unit. Vital signs WDL. Pt expressed pain r/t old infiltrated IV site on R arm. Pt given Tylenol. Call bell within reach.   Leonidas Romberg, RN

## 2015-11-21 NOTE — Progress Notes (Signed)
PULMONARY / CRITICAL CARE MEDICINE   Name: Sean Hinton MRN: [id number] DOB: 01/18/1961    ADMISSION DATE:  11/19/2015 CONSULTATION DATE:  11/19/15  REFERRING MD :  [facility]  PRIMARY SERVICE: PCCM   BRIEF PATIENT DESCRIPTION: 55 yo M with cocaine abuse and recent hospitalization for NSTEMI who presented with possible hemotypsis and acute respiratory failure requiring emergent intubation.    SIGNIFICANT EVENTS / STUDIES:  1/25 Admitted for acute respiratory failure requiring emergent intubation. On nitro drip. 1/26 Started on amio drip for afib with RVR  1/26 Off nitro drip, extubated    LINES / TUBES: 1/25 ETT >>1/26 1/25 OG >>1/26 1/25 Foley >>1/26  CULTURES: 1/25 MRSA >> neg 1/25 Blood >> 1/25 Strep pneumo >> neg 1/25 Legionella >>  ANTIBIOTICS: 1/25 Vanc >>1/26 1/25 Zosyn >>1/25 (1 dose) 1/25 Cefepime  >>1/26 1/26 Unasyn >>1/27  SUBJECTIVE: Pt afebrile overnight. Extubated yesterday now on room air. Tachycardic this morning, still on amio drip. Reports tooth pain after intubation. Would like to go home today.     VITAL SIGNS: Temp:  [97.5 F (36.4 C)-101.8 F (38.8 C)] 97.5 F (36.4 C) (01/27 0400) Pulse Rate:  [31-134] 77 (01/27 0600) Resp:  [14-30] 19 (01/27 0600) BP: (104-161)/(71-111) 106/84 mmHg (01/27 0600) SpO2:  [89 %-100 %] 99 % (01/27 0600) FiO2 (%):  [40 %] 40 % (01/26 0727) Weight:  [159 lb 6.3 oz (72.3 kg)] 159 lb 6.3 oz (72.3 kg) (01/27 0500) HEMODYNAMICS:   VENTILATOR SETTINGS: Vent Mode:  [-] PRVC FiO2 (%):  [40 %] 40 % Set Rate:  [18 bmp] 18 bmp Vt Set:  [630 mL] 630 mL PEEP:  [5 cmH20] 5 cmH20 Plateau Pressure:  [19 cmH20] 19 cmH20 INTAKE / OUTPUT: Intake/Output      01/26 0701 - 01/27 0700   P.O. 1200   I.V. (mL/kg) 3069 (42.4)   IV Piggyback 300   Total Intake(mL/kg) 4569 (63.2)   Urine (mL/kg/hr) 1525 (0.9)   Emesis/NG output 350 (0.2)   Stool 0 (0)   Total Output 1875   Net +2694       Urine Occurrence 1 x   Stool  Occurrence 1 x     PHYSICAL EXAMINATION: General:  NAD Neuro: A & O x 3  HEENT:  Normocephalic Cardiovascular:  Tachycardic mild  with regular rhythm  Lungs: Clear in anterior fields with occasional expiratory wheezing Abdomen:  Soft, non-tender, non-distended, normal BS Musculoskeletal:  No LE edema Skin:  No rashes  LABS:  CBC  Recent Labs Lab 11/19/15 0804 11/20/15 0338 11/21/15 0333  WBC 17.1* 11.1* 9.8  HGB 13.7 12.4* 13.3  HCT 40.1 37.4* 39.5  PLT 222 219 197   Coag's No results for input(s): APTT, INR in the last 168 hours. BMET  Recent Labs Lab 11/19/15 0804 11/20/15 0338 11/21/15 0333  NA 140 144 140  K 3.6 3.6 3.2*  CL 104 106 105  CO2 23 27 25   BUN 22* 18 20  CREATININE 1.26* 1.42* 1.52*  GLUCOSE 260* 138* 266*   Electrolytes  Recent Labs Lab 11/19/15 0804 11/20/15 0338 11/21/15 0333  CALCIUM 9.1 8.8* 8.4*  MG  --  1.9 1.9  PHOS  --   --  3.8   Sepsis Markers  Recent Labs Lab 11/19/15 1356 11/20/15 0338 11/21/15 0333  PROCALCITON 0.89 1.73 1.03   ABG  Recent Labs Lab 11/19/15 1228 11/19/15 1448  PHART 7.354 7.395  PCO2ART 46.0* 47.3*  PO2ART 206.0* 324.0*   Liver Enzymes  Recent Labs Lab 11/19/15 0804 11/20/15 0338  AST 21 15  ALT 23 22  ALKPHOS 102 83  BILITOT 0.7 0.9  ALBUMIN 2.8* 2.6*   Cardiac Enzymes  Recent Labs Lab 11/19/15 1203 11/19/15 1356 11/19/15 1825  TROPONINI 0.41* 0.43* 0.41*   Glucose  Recent Labs Lab 11/20/15 0949 11/20/15 1112 11/20/15 1215 11/20/15 1322 11/20/15 1647 11/20/15 2208  GLUCAP 137* 103* 130* 187* 275* 174*    Imaging Dg Chest 2 View  11/19/2015  CLINICAL DATA:  Shortness of breath. EXAM: CHEST  2 VIEW COMPARISON:  11/03/2015. FINDINGS: Normal sized heart. Interval mild patchy opacity in the right upper lobe and superior segment of the right lower lobe. Decreased prominence of the interstitial markings. The hila are more prominent, rounded and denser, specially on  the right. Mild thoracic spine degenerative changes. IMPRESSION: 1. Interval probable mild pneumonia in the right upper lobe and superior segment of the right lower lobe. 2. Interval possible bilateral hilar adenopathy, greater than on the right. 3. Improving interstitial pulmonary edema. Electronically Signed   By: Beckie Salts M.D.   On: 11/19/2015 07:41   Dg Chest Port 1 View  11/20/2015  CLINICAL DATA:  Acute respiratory failure. EXAM: PORTABLE CHEST 1 VIEW COMPARISON:  11/19/2015. FINDINGS: Endotracheal tube and NG tube in stable position. Mediastinum and hilar structures are normal. Heart size normal. Interval near complete clearing of right lung infiltrate. No pleural effusion or pneumothorax. IMPRESSION: 1. Endotracheal tube and NG tube in stable position. 2. Interim near complete clearing of right lung infiltrate. Electronically Signed   By: Maisie Fus  Register   On: 11/20/2015 07:09   Dg Chest Port 1 View  11/19/2015  CLINICAL DATA:  Check endotracheal tube position EXAM: PORTABLE CHEST 1 VIEW COMPARISON:  11/19/2015 . FINDINGS: Endotracheal tube has been advanced and now lies 5.2 cm above the carina in satisfactory position. Improved aeration is noted within the lungs bilaterally but particularly on the left. Some residual changes in the right lung are noted. Cardiac shadow is within normal limits. No other focal abnormality is seen. IMPRESSION: Improved endotracheal tube position. Improved aeration on the left with persistent airspace disease in the right lung. Electronically Signed   By: Alcide Clever M.D.   On: 11/19/2015 16:27   Dg Chest Port 1 View  11/19/2015  CLINICAL DATA:  Respiratory distress.  Endotracheal tube placement. EXAM: PORTABLE CHEST 1 VIEW COMPARISON:  Earlier film, same date. FINDINGS: The endotracheal tube is above the thoracic inlet, 13 cm above the carina. This should be advanced at least 8 cm. Persistent bilateral airspace process. No pleural effusion or pneumothorax.  IMPRESSION: The endotracheal tube is 13 cm above the carina and should be advanced at least 8 cm. Persistent bilateral airspace process. Electronically Signed   By: Rudie Meyer M.D.   On: 11/19/2015 13:39   Dg Abd Portable 1v  11/19/2015  CLINICAL DATA:  Orogastric tube placement. Congestive heart failure and atrial fibrillation. Myocardial infarct. EXAM: PORTABLE ABDOMEN - 1 VIEW COMPARISON:  None. FINDINGS: Orogastric tube is seen with tip overlying the distal gastric antrum. No evidence of dilated bowel loops. IMPRESSION: Orogastric tube tip overlies the distal gastric antrum. Electronically Signed   By: Myles Rosenthal M.D.   On: 11/19/2015 18:32     ASSESSMENT / PLAN:  PULMONARY A: Acute hypercapnic respiratory failure requiring mechanical ventilation - extubated on 1/26 Possible aspiration PNA with right lung infiltrate - resolved  Moderate pulmonary hypertension  Tobacco abuse  P:  Oxygen therapy to keep SpO2 >92% Albuterol neb Q 3 hr PRN Dunebs duonebs BID Discontinue unasyn  Even to neg balance  CARDIOVASCULAR A: Afib with RVR - at home on xarelto  History of aflutter Elevated troponin in setting of demand ischemia  Acute on chronic CHF EF 40-45% on 11/19/15 net -3.2 L  CAD with recent NSTEMI Coronary spasm in setting of cocaine abuse  Mitral regurgitation  Hypertension  Hyperlipidemia  P:  Transition amio drip to PO tomm per cards  Hold AC for now, questionable if candidate in setting of noncompliance- per cards Aspirin 81 mg daily and plavix 75 mg daily   Hydralazine 50 mg TID to 25 mg TID per cards , consider dc Start imdur 30 mg daily per cards  Digoxin 0.125 mg daily Hold home lisinopril 20 mg daily, lasix 40 mg daily, and carvedilol 12.5 mg BID Lipitor 80 mg daily   RENAL A:  Non oliguric AKI - Cr 1.52 Hypokalemia - 3.2  P:   Replete K Monitor BMP Daily weights and strict I & O's  Avoid nephrotoxins  Hold home lisinopril 20 mg daily - hold with crt Lasix  consider hold  GASTROINTESTINAL A: Nutrition  GERD Constipation P:   Heart healthy/carb modified  IV protonix 40 mg daily home med, to oral Ducolax suppository daily PRN  HEMATOLOGIC A:  Chronic AC in setting of PAF on xarelto- Hg stable  DVT Ppx P:  Monitor CBC  Transfuse if Hg<7 Hold AC for now, questionable if candidate in setting of noncompliance SQ heparin TID   INFECTIOUS A:  Possible Aspiration PNA with right lung infiltrate - resolved Leukocytosis -resolved  P:   D/c unasyn   ENDOCRINE A:  Hyperglycemia Non insulin dependent Type 2 DM - A1c 9.9 on 11/19/15 P:   Lantus 10 U daily  Resistant SSI with meals   Add Novolog 3 U TID with meals  Goal CBG <180 Hold home glipizide    NEUROLOGIC A:  Anxiety  Tooth pain P:   Xanax 0.25 mg BID PRN Tramadol 50 Q 6 hr PRN   Otis Brace MD PGY-3 IMTS Pager [phone]  11/21/2015, 7:00 AM   STAFF NOTE: Cindi Carbon, MD FACP have personally reviewed patient's available data, including medical history, events of note, physical examination and test results as part of my evaluation. I have discussed with resident/NP and other care providers such as pharmacist, RN and RRT. In addition, I personally evaluated patient and elicited key findings of: extubated, no distress, lungs clear, not impressed PNA, dc all abx, no sig hemoptysis, amio drip per cards consider to bid, follow renal fxn , no acei, diet, ppi home med, k supp To [facility], tele if take amio none titrate  Mcarthur Rossetti. Tyson Alias, MD, FACP Pgr: [phone] [facility] 11/21/2015 9:15 AM

## 2015-11-21 NOTE — Progress Notes (Signed)
    Subjective:  Extubated; denies dyspnea or chest pain   Objective:  Filed Vitals:   11/21/15 0300 11/21/15 0400 11/21/15 0500 11/21/15 0600  BP: 120/90 118/92 122/93 106/84  Pulse: 83 91 93 77  Temp:  97.5 F (36.4 C)    TempSrc:  Oral    Resp: 19 14 21 19   Height:      Weight:   159 lb 6.3 oz (72.3 kg)   SpO2: 100% 94% 97% 99%    Intake/Output from previous day:  Intake/Output Summary (Last 24 hours) at 11/21/15 0634 Last data filed at 11/21/15 0400  Gross per 24 hour  Intake 4627.09 ml  Output   1875 ml  Net 2752.09 ml    Physical Exam: Physical exam: Well-developed well-nourished intubated Skin is warm and dry.  HEENT is normal.  Neck is supple.  Chest is clear to auscultation with normal expansion.  Cardiovascular exam is regular rate and rhythm. 2/6 systolic murmur apex Abdominal exam nontender or distended. No masses palpated. Extremities show no edema. neuro grossly intact    Lab Results: Basic Metabolic Panel:  Recent Labs  11/23/15 0338 11/21/15 0333  NA 144 140  K 3.6 3.2*  CL 106 105  CO2 27 25  GLUCOSE 138* 266*  BUN 18 20  CREATININE 1.42* 1.52*  CALCIUM 8.8* 8.4*  MG 1.9 1.9  PHOS  --  3.8   CBC:  Recent Labs  11/19/15 0804 11/20/15 0338 11/21/15 0333  WBC 17.1* 11.1* 9.8  NEUTROABS 15.1*  --  5.9  HGB 13.7 12.4* 13.3  HCT 40.1 37.4* 39.5  MCV 85.0 85.6 85.3  PLT 222 219 197   Cardiac Enzymes:  Recent Labs  11/19/15 1203 11/19/15 1356 11/19/15 1825  TROPONINI 0.41* 0.43* 0.41*     Assessment/Plan:  1 acute pulmonary edema/respiratory failure-Likely precipitated by cocaine use. Pt appears to be euvolemic and Cr mildly increased; hold lasix today. Recheck renal function in AM. Change hydralazine to 25 mg TID and add imdur; resume ACEI when renal function stable. Antibiotics for possible pneumonia per CCM. 2 Elevated troponin-there are no diagnostic ST changes on his electrocardiogram. Trend not consistent with  acute coronary syndrome. No plans for further ischemia eval. 3 status post recent PCI-continue aspirin and Plavix. Continue statin. 4 hypertension-Med adjustments as outlined under number 1; beta blocker held due to recent cocaine. 5 ischemic cardiomyopathy-hold beta blocker as described above. Hydralazine nitrates; add ACEI when renal function stable. 6 cocaine use-Patient previously counseled on discontinuing. 7 paroxysmal atrial fibrillation-patient again with PAF last PM; continue IV amiodarone for now; transition to po in AM. Hold xarelto given recent hemoptysis. 8 MR-severe on echo; will need fu studies in the future. 9 Hypokalemia-supplement  11/21/15 11/21/2015, 6:34 AM

## 2015-11-22 ENCOUNTER — Inpatient Hospital Stay (HOSPITAL_COMMUNITY): Payer: Self-pay

## 2015-11-22 DIAGNOSIS — J189 Pneumonia, unspecified organism: Secondary | ICD-10-CM

## 2015-11-22 DIAGNOSIS — H547 Unspecified visual loss: Secondary | ICD-10-CM

## 2015-11-22 DIAGNOSIS — R519 Headache, unspecified: Secondary | ICD-10-CM | POA: Insufficient documentation

## 2015-11-22 DIAGNOSIS — H5711 Ocular pain, right eye: Secondary | ICD-10-CM

## 2015-11-22 DIAGNOSIS — H5710 Ocular pain, unspecified eye: Secondary | ICD-10-CM | POA: Insufficient documentation

## 2015-11-22 DIAGNOSIS — I214 Non-ST elevation (NSTEMI) myocardial infarction: Secondary | ICD-10-CM

## 2015-11-22 DIAGNOSIS — E1159 Type 2 diabetes mellitus with other circulatory complications: Secondary | ICD-10-CM

## 2015-11-22 DIAGNOSIS — R51 Headache: Secondary | ICD-10-CM

## 2015-11-22 LAB — RENAL FUNCTION PANEL
ANION GAP: 8 (ref 5–15)
Albumin: 2.3 g/dL — ABNORMAL LOW (ref 3.5–5.0)
BUN: 22 mg/dL — AB (ref 6–20)
CO2: 25 mmol/L (ref 22–32)
Calcium: 8.6 mg/dL — ABNORMAL LOW (ref 8.9–10.3)
Chloride: 108 mmol/L (ref 101–111)
Creatinine, Ser: 1.28 mg/dL — ABNORMAL HIGH (ref 0.61–1.24)
GFR calc non Af Amer: >60 mL/min (ref >60)
Glucose, Bld: 242 mg/dL — ABNORMAL HIGH (ref 65–99)
POTASSIUM: 3.6 mmol/L (ref 3.5–5.1)
Phosphorus: 2.8 mg/dL (ref 2.5–4.6)
Sodium: 141 mmol/L (ref 135–145)

## 2015-11-22 LAB — GLUCOSE, CAPILLARY
GLUCOSE-CAPILLARY: 248 mg/dL — AB (ref 65–99)
GLUCOSE-CAPILLARY: 196 mg/dL — AB (ref 65–99)
GLUCOSE-CAPILLARY: 208 mg/dL — AB (ref 65–99)
GLUCOSE-CAPILLARY: 253 mg/dL — AB (ref 65–99)

## 2015-11-22 LAB — MAGNESIUM: Magnesium: 1.8 mg/dL (ref 1.7–2.4)

## 2015-11-22 MED ORDER — MORPHINE SULFATE (PF) 2 MG/ML IV SOLN
2.0000 mg | INTRAVENOUS | Status: DC | PRN
  Administered 2015-11-22 – 2015-11-23 (×7): 2 mg via INTRAVENOUS
  Filled 2015-11-22 (×7): qty 1

## 2015-11-22 MED ORDER — AMIODARONE HCL 200 MG PO TABS
400.0000 mg | ORAL_TABLET | Freq: Two times a day (BID) | ORAL | Status: DC
  Administered 2015-11-22 (×2): 400 mg via ORAL
  Filled 2015-11-22: qty 2

## 2015-11-22 NOTE — Progress Notes (Signed)
[facility]                                                                              Patient Demographics  Sean Hinton, is a 55 y.o. male, DOB - 10-Mar-1961, ZOX:[id number]  Admit date - 11/19/2015   Admitting Physician Sean Rocks, MD  Outpatient Primary MD for the patient is Sean Paula, MD  LOS - 3   Chief Complaint  Patient presents with  . Shortness of Breath      HPI on 11/19/2015 by Dr. Leeann Must Hinton is a 55 y.o. male  Shortness of breath. Started 2 days ago. Initially intermittent but now constant. Recently admitted at the beginning of January for NSTEMI. Shortness breath is associated with productive cough with some blood. Denies fevers. Chest pain started in the ED and is substernal without radiation. Denies any lower extremity swelling. Patient's symptoms became worse after smoking a cigarette. Denies any recent drug use.   Breathing treatment w/ mild improvement.   Assessment & Plan   Eye pain/Blindness -patient complains of hearing a pop around his left eye, but complains of pain around there right eye.  He states he cannot see peripherally out of either eye, but can follow my voice. -Will order CT head and orbitals -Ophthalmology, Sean Hinton, consulted and appreciated  Acute hypercapnic respiratory failure -patient required intubation and was extubated on 11/20/2015 -Possibly secondary to aspiration pneumonia as patient did have right lung infiltrate, this has resolved  Aspiration pneumonia -Appears to have resolved, patient completed Unasyn course -Chest x-ray on 11/21/2015 shows no acute cardiopulmonary disease, interim clearing of previously identified right lung infiltrate  Polysubstance abuse and Tobacco abuse -Toxicology screen positive for barbiturates and cocaine -Discussed smoking cessation  Paroxsymal Atrial fibrillation with RVR -Patient was on Xarelto at home -Cardiac consulted and appreciated -Required  amiodarone drip, cardiology transitioning to oral -Xarelto held due to question of compliance and questionable hemoptysis  -Continue digoxin.   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  Time Spent in minutes   45 minutes  Procedures/Significant Events  1/25 Admitted for acute respiratory failure requiring emergent intubation. On nitro drip. 1/25 Echocardiogram 1/26 Started on amio drip for afib with RVR  1/26 Off nitro drip, extubated   Consults   [facility]  DVT Prophylaxis  Heparin  Lab Results  Component Value Date   PLT 197 11/21/2015    Medications  Scheduled Meds: . aspirin  81 mg Oral Daily  . atorvastatin  80 mg Oral QPM  . chlorhexidine gluconate  15 mL Mouth Rinse BID  . clopidogrel  75 mg Oral Q breakfast  . digoxin  0.0625 mg Oral Daily  . heparin subcutaneous  5,000 Units Subcutaneous 3 times per day  . hydrALAZINE  25 mg Oral TID  . insulin aspart  0-20 Units Subcutaneous TID WC  . insulin aspart  3 Units Subcutaneous TID WC  . insulin glargine  16 Units Subcutaneous QHS  .  ipratropium-albuterol  3 mL Nebulization BID  . isosorbide mononitrate  30 mg Oral Daily  . pantoprazole  40 mg Oral Daily  . sodium chloride flush  3 mL Intravenous Q12H  . sodium chloride flush  3 mL Intravenous Q12H   Continuous Infusions: . amiodarone 30 mg/hr (11/22/15 0250)   PRN Meds:.sodium chloride, acetaminophen **OR** acetaminophen, albuterol, ALPRAZolam, bisacodyl, morphine injection, ondansetron **OR** ondansetron (ZOFRAN) IV, sodium chloride flush, traMADol  Antibiotics    Anti-infectives    Start     Dose/Rate Route Frequency Ordered Stop   11/20/15 1430  Ampicillin-Sulbactam (UNASYN) 3 g in sodium chloride 0.9 % 100 mL IVPB  Status:  Discontinued     3 g 100 mL/hr over 60 Minutes Intravenous Every 8 hours 11/20/15 1012 11/21/15 0916   11/19/15 2200  vancomycin (VANCOCIN) IVPB 1000 mg/200 mL premix  Status:  Discontinued     1,000 mg 200 mL/hr over 60 Minutes Intravenous Every 12 hours 11/19/15 0911 11/20/15 0856   11/19/15 1500  ceFEPIme (MAXIPIME) 1 g in dextrose 5 % 50 mL IVPB  Status:  Discontinued     1 g 100 mL/hr over 30 Minutes Intravenous 3 times per day 11/19/15 1102 11/20/15 0942   11/19/15 0815  vancomycin (VANCOCIN) 1,500 mg in sodium chloride 0.9 % 500 mL IVPB     1,500 mg 250 mL/hr over 120 Minutes Intravenous  Once 11/19/15 0801 11/19/15 1531   11/19/15 0800  piperacillin-tazobactam (ZOSYN) IVPB 3.375 g     3.375 g 100 mL/hr over 30 Minutes Intravenous  Once 11/19/15 0756 11/19/15 0921      Subjective:   Sean Hinton seen and examined today.  patient complains of headache and particularly pain around his right eye as well as his teeth. He states he heard a pop around his left eye and cannot see peripherally. Patient can follow my voice and tracks me around the room. Patient denies any chest pain or shortness of breath at this time.   Objective:   Filed Vitals:   11/21/15 1818 11/21/15 2120 11/21/15 2129 11/22/15 0501  BP: 123/82 121/83   117/79  Pulse: 93 92  95  Temp: 98.4 F (36.9 C) 98.2 F (36.8 C)  98.5 F (36.9 C)  TempSrc: Oral Oral  Oral  Resp: 18 20  18   Height:      Weight:    73.392 kg (161 lb 12.8 oz)  SpO2: 100% 94% 95% 90%    Wt Readings from Last 3 Encounters:  11/22/15 73.392 kg (161 lb 12.8 oz)  11/17/15 76.295 kg (168 lb 3.2 oz)  11/11/15 73.936 kg (163 lb)     Intake/Output Summary (Last 24 hours) at 11/22/15 1005 Last data filed at 11/22/15 0252  Gross per 24 hour  Intake  872.1 ml  Output    300 ml  Net  572.1 ml    Exam  General: Well developed, well nourished, NAD, appears stated  age  HEENT: NCAT, PERRLA, EOMI, Anicteic Sclera, mucous membranes moist.   Cardiovascular: S1 S2 auscultated, no rubs, murmurs or gallops. Regular rate and rhythm.  Respiratory: Clear to auscultation   Abdomen: Soft, nontender, nondistended, + bowel sounds  Extremities: warm dry without cyanosis clubbing or edema  Neuro: AAOx3, nonfocal,    Skin: Without rashes exudates or nodules  Psych: Anxious  Data Review   Micro Results Recent Results (from the past 240 hour(s))  Culture, blood (routine x 2) Call MD if unable to obtain prior to antibiotics being given     Status: None (Preliminary result)   Collection Time: 11/19/15 11:30 AM  Result Value Ref Range Status   Specimen Description BLOOD RIGHT ANTECUBITAL  Final   Special Requests BOTTLES DRAWN AEROBIC AND ANAEROBIC 5CC  Final   Culture NO GROWTH 2 DAYS  Final   Report Status PENDING  Incomplete  Culture, blood (routine x 2) Call MD if unable to obtain prior to antibiotics being given     Status: None (Preliminary result)   Collection Time: 11/19/15 11:49 AM  Result Value Ref Range Status   Specimen Description BLOOD RIGHT FOREARM  Final   Special Requests IN PEDIATRIC BOTTLE 2CC  Final   Culture NO GROWTH 2 DAYS  Final   Report Status PENDING  Incomplete  MRSA PCR Screening     Status: None   Collection Time: 11/19/15  8:03 PM    Result Value Ref Range Status   MRSA by PCR NEGATIVE NEGATIVE Final    Comment:        The GeneXpert MRSA Assay (FDA approved for NASAL specimens only), is one component of a comprehensive MRSA colonization surveillance program. It is not intended to diagnose MRSA infection nor to guide or monitor treatment for MRSA infections.     Radiology Reports Dg Chest 2 View  11/19/2015  CLINICAL DATA:  Shortness of breath. EXAM: CHEST  2 VIEW COMPARISON:  11/03/2015. FINDINGS: Normal sized heart. Interval mild patchy opacity in the right upper lobe and superior segment of the right lower lobe. Decreased prominence of the interstitial markings. The hila are more prominent, rounded and denser, specially on the right. Mild thoracic spine degenerative changes. IMPRESSION: 1. Interval probable mild pneumonia in the right upper lobe and superior segment of the right lower lobe. 2. Interval possible bilateral hilar adenopathy, greater than on the right. 3. Improving interstitial pulmonary edema. Electronically Signed   By: Beckie Salts M.D.   On: 11/19/2015 07:41   Dg Chest 2 View  11/03/2015  CLINICAL DATA:  Central and RIGHT-sided chest pain or shortness of breath tonight. History of hypertension, diabetes, atrial fibrillation, pneumonia, renal failure, CHF. EXAM: CHEST  2 VIEW COMPARISON:  Chest radiograph October 15, 2015 FINDINGS: Cardiomediastinal silhouette is unremarkable. Diffuse interstitial prominence with patchy airspace opacities, predominant LEFT lung base. No pleural effusion. No pneumothorax. Soft tissue planes and included osseous structures are nonsuspicious. IMPRESSION: Interstitial prominence, with patchy airspace opacities concerning for bronchopneumonia. Followup PA and lateral chest X-ray is recommended in 3-4 weeks following trial of antibiotic therapy to ensure resolution and exclude underlying malignancy. Electronically Signed   By: Awilda Metro M.D.   On: 11/03/2015 00:35   Dg  Chest Port 1 View  11/21/2015  CLINICAL DATA:  CHF. EXAM: PORTABLE CHEST 1 VIEW COMPARISON:  11/20/2015.  11/19/2015. FINDINGS: Interim extubation removal of NG tube. Mediastinum and hilar structures normal. Heart size stable. No pulmonary venous congestion. No focal infiltrate. No pleural effusion  or pneumothorax. IMPRESSION: 1. Interim extubation removal of NG tube. 2. No acute cardiopulmonary disease. Interim clearing of previously identified right lung infiltrate. Electronically Signed   By: Maisie Fus  Register   On: 11/21/2015 07:30   Dg Chest Port 1 View  11/20/2015  CLINICAL DATA:  Acute respiratory failure. EXAM: PORTABLE CHEST 1 VIEW COMPARISON:  11/19/2015. FINDINGS: Endotracheal tube and NG tube in stable position. Mediastinum and hilar structures are normal. Heart size normal. Interval near complete clearing of right lung infiltrate. No pleural effusion or pneumothorax. IMPRESSION: 1. Endotracheal tube and NG tube in stable position. 2. Interim near complete clearing of right lung infiltrate. Electronically Signed   By: Maisie Fus  Register   On: 11/20/2015 07:09   Dg Chest Port 1 View  11/19/2015  CLINICAL DATA:  Check endotracheal tube position EXAM: PORTABLE CHEST 1 VIEW COMPARISON:  11/19/2015 . FINDINGS: Endotracheal tube has been advanced and now lies 5.2 cm above the carina in satisfactory position. Improved aeration is noted within the lungs bilaterally but particularly on the left. Some residual changes in the right lung are noted. Cardiac shadow is within normal limits. No other focal abnormality is seen. IMPRESSION: Improved endotracheal tube position. Improved aeration on the left with persistent airspace disease in the right lung. Electronically Signed   By: Alcide Clever M.D.   On: 11/19/2015 16:27   Dg Chest Port 1 View  11/19/2015  CLINICAL DATA:  Respiratory distress.  Endotracheal tube placement. EXAM: PORTABLE CHEST 1 VIEW COMPARISON:  Earlier film, same date. FINDINGS: The  endotracheal tube is above the thoracic inlet, 13 cm above the carina. This should be advanced at least 8 cm. Persistent bilateral airspace process. No pleural effusion or pneumothorax. IMPRESSION: The endotracheal tube is 13 cm above the carina and should be advanced at least 8 cm. Persistent bilateral airspace process. Electronically Signed   By: Rudie Meyer M.D.   On: 11/19/2015 13:39   Dg Abd Portable 1v  11/19/2015  CLINICAL DATA:  Orogastric tube placement. Congestive heart failure and atrial fibrillation. Myocardial infarct. EXAM: PORTABLE ABDOMEN - 1 VIEW COMPARISON:  None. FINDINGS: Orogastric tube is seen with tip overlying the distal gastric antrum. No evidence of dilated bowel loops. IMPRESSION: Orogastric tube tip overlies the distal gastric antrum. Electronically Signed   By: Myles Rosenthal M.D.   On: 11/19/2015 18:32   Ct Angio Chest Aorta W/cm &/or Wo/cm  11/03/2015  CLINICAL DATA:  55 year old male with central chest pain. EXAM: CT ANGIOGRAPHY CHEST WITH CONTRAST TECHNIQUE: Multidetector CT imaging of the chest was performed using the standard protocol during bolus administration of intravenous contrast. Multiplanar CT image reconstructions and MIPs were obtained to evaluate the vascular anatomy. CONTRAST:  OMNIPAQUE IOHEXOL 350 MG/ML SOLN COMPARISON:  Radiograph dated 11/02/2015 FINDINGS: There are small bilateral pleural effusions. There is diffuse interstitial prominence with patchy areas of ground-glass airspace opacity predominantly involving the lower lobes compatible with pneumonia. Clinical correlation and follow-up resolution is recommended. There is no pneumothorax. The central airways are patent. The thoracic aorta appears unremarkable. Evaluation of the pulmonary arteries is limited due to suboptimal opacification of the peripheral branches. No central pulmonary artery embolus identified. There is no cardiomegaly or pericardial effusion. Top-normal bilateral hilar lymph nodes.  There is coronary vascular calcification. The visualized esophagus and thyroid gland appear grossly unremarkable. There is no axillary adenopathy. The chest wall soft tissues appear unremarkable. Mild degenerative changes of the spine. No acute fracture. The visualized upper abdomen is unremarkable. Review of  the MIP images confirms the above findings. IMPRESSION: No CT evidence of pulmonary embolism or aortic dissection. Small bilateral pleural effusions and diffuse bilateral ground-glass airspace opacities most compatible with pneumonia. Clinical correlation and follow-up recommended. Electronically Signed   By: Elgie Collard M.D.   On: 11/03/2015 06:49    CBC  Recent Labs Lab 11/17/15 1106 11/19/15 0804 11/20/15 0338 11/21/15 0333  WBC 6.6 17.1* 11.1* 9.8  HGB 12.1* 13.7 12.4* 13.3  HCT 37.4* 40.1 37.4* 39.5  PLT 257 222 219 197  MCV 86.6 85.0 85.6 85.3  MCH 28.0 29.0 28.4 28.7  MCHC 32.4 34.2 33.2 33.7  RDW 13.8 13.9 14.3 14.6  LYMPHSABS  --  1.0  --  2.8  MONOABS  --  0.9  --  0.8  EOSABS  --  0.0  --  0.3  BASOSABS  --  0.0  --  0.0    Chemistries   Recent Labs Lab 11/17/15 1106 11/19/15 0804 11/20/15 0338 11/21/15 0333 11/22/15 0257  NA 141 140 144 140 141  K 4.4 3.6 3.6 3.2* 3.6  CL 108 104 106 105 108  CO2 22 23 27 25 25   GLUCOSE 157* 260* 138* 266* 242*  BUN 25 22* 18 20 22*  CREATININE 1.21 1.26* 1.42* 1.52* 1.28*  CALCIUM 8.8 9.1 8.8* 8.4* 8.6*  MG  --   --  1.9 1.9 1.8  AST  --  21 15  --   --   ALT  --  23 22  --   --   ALKPHOS  --  102 83  --   --   BILITOT  --  0.7 0.9  --   --    ------------------------------------------------------------------------------------------------------------------ estimated creatinine clearance is 70.1 mL/min (by C-G formula based on Cr of 1.28). ------------------------------------------------------------------------------------------------------------------  Recent Labs  11/19/15 1203  HGBA1C 9.9*    ------------------------------------------------------------------------------------------------------------------  Recent Labs  11/19/15 1356  TRIG 69   ------------------------------------------------------------------------------------------------------------------ No results for input(s): TSH, T4TOTAL, T3FREE, THYROIDAB in the last 72 hours.  Invalid input(s): FREET3 ------------------------------------------------------------------------------------------------------------------ No results for input(s): VITAMINB12, FOLATE, FERRITIN, TIBC, IRON, RETICCTPCT in the last 72 hours.  Coagulation profile No results for input(s): INR, PROTIME in the last 168 hours.  No results for input(s): DDIMER in the last 72 hours.  Cardiac Enzymes  Recent Labs Lab 11/19/15 1203 11/19/15 1356 11/19/15 1825  TROPONINI 0.41* 0.43* 0.41*   ------------------------------------------------------------------------------------------------------------------ Invalid input(s): POCBNP    Meklit Cotta D.O. on 11/22/2015 at 10:05 AM  Between 7am to 7pm - Pager - [phone]  After 7pm go to [email or web address] - password [email or web address]  And look for the night coverage person covering for me after hours  [facility] Office  [phone]

## 2015-11-22 NOTE — Progress Notes (Addendum)
Subjective:  The patient is in distress, he felt a pain in his head about 30 minutes ago and lost his vision in the left eye.  Marland Kitchen aspirin  81 mg Oral Daily  . atorvastatin  80 mg Oral QPM  . chlorhexidine gluconate  15 mL Mouth Rinse BID  . clopidogrel  75 mg Oral Q breakfast  . digoxin  0.0625 mg Oral Daily  . heparin subcutaneous  5,000 Units Subcutaneous 3 times per day  . hydrALAZINE  25 mg Oral TID  . insulin aspart  0-20 Units Subcutaneous TID WC  . insulin aspart  3 Units Subcutaneous TID WC  . insulin glargine  16 Units Subcutaneous QHS  . ipratropium-albuterol  3 mL Nebulization BID  . isosorbide mononitrate  30 mg Oral Daily  . pantoprazole  40 mg Oral Daily  . sodium chloride flush  3 mL Intravenous Q12H  . sodium chloride flush  3 mL Intravenous Q12H   Objective:  Filed Vitals:   11/21/15 1818 11/21/15 2120 11/21/15 2129 11/22/15 0501  BP: 123/82 121/83  117/79  Pulse: 93 92  95  Temp: 98.4 F (36.9 C) 98.2 F (36.8 C)  98.5 F (36.9 C)  TempSrc: Oral Oral  Oral  Resp: 18 20  18   Height:      Weight:    161 lb 12.8 oz (73.392 kg)  SpO2: 100% 94% 95% 90%    Intake/Output from previous day:  Intake/Output Summary (Last 24 hours) at 11/22/15 0900 Last data filed at 11/22/15 0252  Gross per 24 hour  Intake  888.8 ml  Output    300 ml  Net  588.8 ml   Physical Exam: General: in acute distress. Neuro: Alert and oriented X 3. Moves all extremities spontaneously. Cant see in his left eye. Psych: Normal affect. HEENT:  Normal  Neck: Supple without bruits or JVD. Lungs:  Resp regular and unlabored, minimal crackles in the bases. Heart: RRR no s3, s4, or murmurs. Abdomen: Soft, non-tender, non-distended, BS + x 4.  Extremities: No clubbing, cyanosis or edema. DP/PT/Radials 2+ and equal bilaterally.  Lab Results: Basic Metabolic Panel:  Recent Labs  11/24/15 0333 11/22/15 0257  NA 140 141  K 3.2* 3.6  CL 105 108  CO2 25 25  GLUCOSE 266* 242*    BUN 20 22*  CREATININE 1.52* 1.28*  CALCIUM 8.4* 8.6*  MG 1.9 1.8  PHOS 3.8 2.8   CBC:  Recent Labs  11/20/15 0338 11/21/15 0333  WBC 11.1* 9.8  NEUTROABS  --  5.9  HGB 12.4* 13.3  HCT 37.4* 39.5  MCV 85.6 85.3  PLT 219 197   Cardiac Enzymes:  Recent Labs  11/19/15 1203 11/19/15 1356 11/19/15 1825  TROPONINI 0.41* 0.43* 0.41*     Assessment/Plan:   1 acute pulmonary edema/respiratory failure-Likely precipitated by cocaine use. Pt appears to be euvolemic and Cr mildly increased but is improving; hold lasix today. Recheck renal function in AM. Change hydralazine to 25 mg TID and add imdur; resume ACEI when renal function stable. Antibiotics for possible pneumonia per CCM.  2 Elevated troponin-there are no diagnostic ST changes on his electrocardiogram. Trend not consistent with acute coronary syndrome. No plans for further ischemia eval.  3 status post recent PCI-continue aspirin and Plavix. Continue statin.  4 hypertension-Med adjustments as outlined under number 1, BP now controlled, ; beta blocker held due to recent cocaine.  5 ischemic cardiomyopathy-hold beta blocker as described above. Hydralazine nitrates; add ACEI  when renal function stable.  6 cocaine use-Patient previously counseled on discontinuing.  7 paroxysmal atrial fibrillation-patient again with PAF on 1/26, off IV amiodarone for now;continue PO. Hold xarelto given recent hemoptysis. Now with new visual changes, if CT abnormal, we will consider TEE and anticoagulaton will need to be restarted.   8 MR-severe on echo; will need fu studies in the future.  9 Hypokalemia-supplement   10. New loss of vision - awaiting head CT  We will follow.  Sean Hinton H 11/22/2015, 9:00 AM

## 2015-11-22 NOTE — Consult Note (Signed)
Chief Complaint  Patient presents with  . Shortness of Breath  : HPI     Ophthalmology HPI: This is a 55 y.o.  male with a past ocular history listed below that presents with sudden painless loss of vision in both eyes. This occurred this morning in the hospital. "I cant see anything, Where are you?"  He thought he heard a "pop" in the left eye and then the vision went out. He says "Things got blurry and now i can't see." "Like, take that TV, I can see that it says 'RCA', but I can't see the power button."      Past Ocular History:  Refractive Errror    Last Eye Exam:  1 year ago, while in prison    Primary Eye Care: None, Mother sees Dr. Dione Booze   Past Medical History  Diagnosis Date  . Diabetes mellitus with complication (HCC)   . Hypertension   . Syncope     a. 02/2015 - felt 2/2 rapid AF/AFL.  Marland Kitchen Atrial fibrillation and flutter (HCC)     a. Dx 02/2015 - placed on Xarelto; b. 09/2015 recurrence in setting of PNA.  . Ischemic cardiomyopathy     a. 09/2015 Echo: EF 35-40%.  Marland Kitchen CAD (coronary artery disease)     a. Dx 02/2015 - 2V CAD with CTO LAD, diffuse dz in PDA, OM - med rx.  . Hypertensive heart disease   . Hyperlipidemia   . Mitral regurgitation     a. Mod by echo 02/2015,  . Tobacco abuse   . Chronic systolic CHF (congestive heart failure) (HCC)     a. 09/2015 Echo: EF 35-40%, sev apical/antlat, apical HK, mild MR, mildly dil LA.  Marland Kitchen ARF (acute respiratory failure) (HCC)   . Pneumonia 09/2015  . GERD (gastroesophageal reflux disease)   . Headache   . Arthritis     ra  . NSTEMI (non-ST elevated myocardial infarction) (HCC) 10/2015     Past Surgical History  Procedure Laterality Date  . Right thumb surgery     . Cardiac catheterization N/A 02/24/2015    Procedure: Left Heart Cath and Coronary Angiography;  Surgeon: Marykay Lex, MD;  Location: [facility] INVASIVE CV LAB CUPID;  Service: Cardiovascular;  Laterality: N/A;  . Cardiac catheterization  02/24/2015     Procedure: Coronary/Bypass Graft CTO Intervention;  Surgeon: Marykay Lex, MD;  Location: [facility] INVASIVE CV LAB CUPID;  Service: Cardiovascular;;  . Cardiac catheterization N/A 11/03/2015    Procedure: Left Heart Cath and Coronary Angiography;  Surgeon: Tonny Bollman, MD;  Location: [facility] INVASIVE CV LAB;  Service: Cardiovascular;  Laterality: N/A;  . Cardiac catheterization N/A 11/03/2015    Procedure: Coronary Stent Intervention;  Surgeon: Tonny Bollman, MD;  Location: [facility] INVASIVE CV LAB;  Service: Cardiovascular;  Laterality: N/A;  RAMUS     Social History   Social History  . Marital Status: Single    Spouse Name: N/A  . Number of Children: N/A  . Years of Education: N/A   Occupational History  . Not on file.   Social History Main Topics  . Smoking status: Current Every Day Smoker -- 0.25 packs/day for 30 years    Types: Cigarettes  . Smokeless tobacco: Never Used  . Alcohol Use: No  . Drug Use: Yes    Special: Cocaine     Comment: "last use 1991", Positive UDS 10/2015  . Sexual Activity: Not on file   Other Topics Concern  . Not on  file   Social History Narrative     No Known Allergies   No current facility-administered medications on file prior to encounter.   Current Outpatient Prescriptions on File Prior to Encounter  Medication Sig Dispense Refill  . albuterol (PROVENTIL HFA;VENTOLIN HFA) 108 (90 BASE) MCG/ACT inhaler Inhale 2 puffs into the lungs every 6 (six) hours as needed for wheezing or shortness of breath. 1 Inhaler 2  . aspirin 81 MG chewable tablet Chew 1 tablet (81 mg total) by mouth daily. 30 tablet 0  . atorvastatin (LIPITOR) 80 MG tablet Take 1 tablet (80 mg total) by mouth every evening. 30 tablet 3  . clopidogrel (PLAVIX) 75 MG tablet Take 1 tablet (75 mg total) by mouth daily with breakfast. 30 tablet 0  . Dexlansoprazole (DEXILANT) 30 MG capsule Take 1 capsule (30 mg total) by mouth daily. 90 capsule 3  . digoxin (LANOXIN) 0.125 MG tablet Take 1  tablet (0.125 mg total) by mouth daily. 30 tablet 3  . furosemide (LASIX) 40 MG tablet Take 1 tablet (40 mg total) by mouth daily. 30 tablet 3  . glipiZIDE (GLUCOTROL) 10 MG tablet Take 1 tablet (10 mg total) by mouth 2 (two) times daily before a meal. (Patient taking differently: Take 5 mg by mouth daily before breakfast. ) 60 tablet 0  . hydrALAZINE (APRESOLINE) 50 MG tablet Take 1 tablet (50 mg total) by mouth 3 (three) times daily. 90 tablet 0  . ipratropium-albuterol (DUONEB) 0.5-2.5 (3) MG/3ML SOLN Take 3 mLs by nebulization 2 (two) times daily. 360 mL 10  . isosorbide mononitrate (IMDUR) 30 MG 24 hr tablet Take 1 tablet (30 mg total) by mouth daily. 30 tablet 2  . lisinopril (PRINIVIL,ZESTRIL) 20 MG tablet Take 1 tablet (20 mg total) by mouth daily. 30 tablet 0  . nitroGLYCERIN (NITROSTAT) 0.4 MG SL tablet Place 1 tablet (0.4 mg total) under the tongue every 5 (five) minutes as needed for chest pain (up to 3 doses). 25 tablet 3  . potassium chloride SA (K-DUR,KLOR-CON) 20 MEQ tablet Take 1 tablet (20 mEq total) by mouth daily. 30 tablet 3  . rivaroxaban (XARELTO) 20 MG TABS tablet Take 1 tablet (20 mg total) by mouth daily with supper. (Patient taking differently: Take 20 mg by mouth daily. ) 30 tablet 3  . spironolactone (ALDACTONE) 25 MG tablet Take 1 tablet (25 mg total) by mouth daily. 30 tablet 3  . traMADol (ULTRAM) 50 MG tablet Take 1 tablet (50 mg total) by mouth every 6 (six) hours as needed for severe pain. 10 tablet 0     ROS    Exam:  General: Awake, Alert, Oriented *3  Vision (near): without correction   OD: 14 point OU   OS: 14 Point OU   Confrontational Field:   ""I can't see your fingers"   Responds to stimuli in all four hemispheres.   Extraocular Motility:  Full ductions and versions, both eyes  [place]:   Unable to perform due to poor patient understanding  External:   Normal Symmetry, V1-3 intact, infraorbital nerve appears intact.   Hertel:         18/18 109  Pupils  OD: 75mm to 86mm reactive without afferent pupillary defect (APD)  OS: 74mm to 24mm reactive without afferent pupillary defect (APD)    Slit Lamp Exam:  Lids/Lashes  OD: Normal Lids and lashes, No lesion or injury  OS: Normal lids and lashes, nor lesion or injury  Conjucntiva/Sclera  OD: White and quiet,  Melanosis   OS: White and quiet, Melanosis  Cornea  OD: Clear without abrasion or defect  OS: Clear without abrasion or defect  Anterior Chamber  OD: Deep and quiet  OS: Deep and quiet  Iris  OD: Normal iris architecture  OS: Normal Iris Architecture   Lens  OD: Trace NS  OS: Trace NS  Anterior Vitreous  OD: Clear, without cell  OS: Clear without cell   POSTERIOR POLE EXAM (Dialated with phenylephrine and tropicamide.Dilation may last up to 24 hours)  View:   OD: 20/20 view without opacities  OS: 20/20 view without opacities  Vitreous:   OD: Clear, no cell  OS: Clear, no cell  Disc:   OD: flat, sharp margin, with appropriate color  OS: flat, sharp margin, with appropriate color  C:D Ratio:   OD: 0.5  OS: 0.5  Macula  OD: Flat, with appropriate light reflex  OS: Flat with appropriate light reflex  Vessels  OD: Normal vasculature  OS: Normal vasculature  Periphery  OD: Flat 360 degrees without tear, hole or detachment  OS: Flat 360 degrees without tear, hole or detachment   CT Scan Reviewed Likley small vessel chronic ischemia.   Assessment and Plan:   This is 55 y.o.  male with subjective visual disturbance.   There is no anatomical reason for his vision complaints. CT does not explain vision loss.  no bilateral occipital lobe strokes  Recommend: No acute ophthalmic intervention Patient can follow- up as outpatient for automated static perimetry and refraction.   Please do not hesitate to contact me with any questions or concerns.   Mack Hook, M.D.  [facility] [address] [place], [place]  [address] [phone] ([phone]

## 2015-11-22 NOTE — Progress Notes (Signed)
After giving report to day shift RN pt complaining of lost of vision in his left eye. Pt also reports extreme headache on right side of head. Neuro intact. MD aware. Pt given PRN pain medication. Oncoming nurse aware. Pt in bed with call bell within reach.   Roselie Awkward, RN

## 2015-11-23 DIAGNOSIS — E785 Hyperlipidemia, unspecified: Secondary | ICD-10-CM

## 2015-11-23 DIAGNOSIS — R042 Hemoptysis: Secondary | ICD-10-CM

## 2015-11-23 LAB — BASIC METABOLIC PANEL
Anion gap: 8 (ref 5–15)
BUN: 18 mg/dL (ref 6–20)
CALCIUM: 8.7 mg/dL — AB (ref 8.9–10.3)
CHLORIDE: 108 mmol/L (ref 101–111)
CO2: 23 mmol/L (ref 22–32)
CREATININE: 1.18 mg/dL (ref 0.61–1.24)
Glucose, Bld: 226 mg/dL — ABNORMAL HIGH (ref 65–99)
Potassium: 3.4 mmol/L — ABNORMAL LOW (ref 3.5–5.1)
SODIUM: 139 mmol/L (ref 135–145)

## 2015-11-23 LAB — CBC
HCT: 37.5 % — ABNORMAL LOW (ref 39.0–52.0)
HEMOGLOBIN: 12.6 g/dL — AB (ref 13.0–17.0)
MCH: 28.8 pg (ref 26.0–34.0)
MCHC: 33.6 g/dL (ref 30.0–36.0)
MCV: 85.6 fL (ref 78.0–100.0)
PLATELETS: 278 10*3/uL (ref 150–400)
RBC: 4.38 MIL/uL (ref 4.22–5.81)
RDW: 14.3 % (ref 11.5–15.5)
WBC: 8.7 10*3/uL (ref 4.0–10.5)

## 2015-11-23 LAB — GLUCOSE, CAPILLARY
GLUCOSE-CAPILLARY: 203 mg/dL — AB (ref 65–99)
GLUCOSE-CAPILLARY: 251 mg/dL — AB (ref 65–99)

## 2015-11-23 MED ORDER — AMIODARONE HCL 200 MG PO TABS
400.0000 mg | ORAL_TABLET | Freq: Two times a day (BID) | ORAL | Status: DC
  Administered 2015-11-23 (×2): 400 mg via ORAL
  Filled 2015-11-23 (×2): qty 2

## 2015-11-23 MED ORDER — AMIODARONE HCL 200 MG PO TABS: 200.0000 mg | ORAL_TABLET | Freq: Every day | ORAL | Status: AC

## 2015-11-23 MED ORDER — AMIODARONE HCL 400 MG PO TABS: 400.0000 mg | ORAL_TABLET | Freq: Every day | ORAL | Status: DC

## 2015-11-23 MED ORDER — HYDRALAZINE HCL 25 MG PO TABS
25.0000 mg | ORAL_TABLET | Freq: Three times a day (TID) | ORAL | Status: DC
Start: 2015-11-23 — End: 2015-12-01

## 2015-11-23 MED ORDER — POTASSIUM CHLORIDE CRYS ER 20 MEQ PO TBCR
40.0000 meq | EXTENDED_RELEASE_TABLET | Freq: Once | ORAL | Status: AC
  Administered 2015-11-23: 40 meq via ORAL
  Filled 2015-11-23: qty 2

## 2015-11-23 MED ORDER — METOPROLOL TARTRATE 25 MG PO TABS
25.0000 mg | ORAL_TABLET | Freq: Two times a day (BID) | ORAL | Status: DC
  Administered 2015-11-23: 25 mg via ORAL
  Filled 2015-11-23: qty 1

## 2015-11-23 MED ORDER — RIVAROXABAN 20 MG PO TABS
20.0000 mg | ORAL_TABLET | Freq: Every day | ORAL | Status: DC
  Administered 2015-11-23: 20 mg via ORAL
  Filled 2015-11-23: qty 1

## 2015-11-23 MED ORDER — METOPROLOL TARTRATE 25 MG PO TABS: 25.0000 mg | ORAL_TABLET | Freq: Two times a day (BID) | ORAL | Status: DC

## 2015-11-23 NOTE — Discharge Summary (Signed)
Physician Discharge Summary  Sean Hinton ZOX:[id number] DOB: 1960-11-15 DOA: 11/19/2015  PCP: Lora Paula, MD  Admit date: 11/19/2015 Discharge date: 11/23/2015  Time spent: 45 minutes  Recommendations for Outpatient Follow-up:  Patient will be discharged to home.  Patient will need to follow up with primary care provider within one week of discharge, repeat BMP.  Follow up with cardiology.  Patient should continue medications as prescribed.  Patient should follow a heart healthy/carb modified diet.  Advised to stop smoking and refrain from cocaine use.    Discharge Diagnoses:  Eye pain/Blindness Acute hypercapnic respiratory failure Aspiration pneumonia Polysubstance abuse and Tobacco abuse Paroxsymal Atrial fibrillation with RVR Elevated troponin/demand ischemia/coronary artery disease Acute on chronic CHF Acute kidney injury Hypokalemia GERD  Constipation Normocytic Anemia  Diabetes mellitus, type II Anxiety Tooth pain/ head pain/ eye pain  Discharge Condition: Stable  Diet recommendation: heart healthy/carb modified diet  Filed Weights   11/21/15 0500 11/22/15 0501 11/23/15 0528  Weight: 72.3 kg (159 lb 6.3 oz) 73.392 kg (161 lb 12.8 oz) 73.936 kg (163 lb)    History of present illness:  on 11/19/2015 by Dr. Leeann Must [name] is a 55 y.o. male  Shortness of breath. Started 2 days ago. Initially intermittent but now constant. Recently admitted at the beginning of January for NSTEMI. Shortness breath is associated with productive cough with some blood. Denies fevers. Chest pain started in the ED and is substernal without radiation. Denies any lower extremity swelling. Patient's symptoms became worse after smoking a cigarette. Denies any recent drug use.   Breathing treatment w/ mild improvement.   Hospital Course:  Eye pain/Blindness -patient complains of hearing a pop around his left eye, but complains of pain around there right eye. He states  he cannot see peripherally out of either eye, but can follow my voice. -CT head: no acute intracranial abnormality -CT orbits: no evidence of bony or soft tissue or focal abnormality -Ophthalmology, Dr. Genia Del, consulted and appreciated- subjective visual disturbance, no anatomical reason for vision complaints, recommended outpatient follow up, no acute ophthalmic intervention -Of note, as I walked into the room this morning, patient was using his cell phone (not on a phone call)  Acute hypercapnic respiratory failure -patient required intubation and was extubated on 11/20/2015 -Possibly secondary to aspiration pneumonia as patient did have right lung infiltrate, this has resolved  Aspiration pneumonia -Appears to have resolved, patient completed Unasyn course -Chest x-ray on 11/21/2015 shows no acute cardiopulmonary disease, interim clearing of previously identified right lung infiltrate  Polysubstance abuse and Tobacco abuse -Toxicology screen positive for barbiturates and cocaine -Discussed smoking cessation  Paroxsymal Atrial fibrillation with RVR -Patient was on Xarelto at home -Cardiac consulted and appreciated -Required amiodarone drip, cardiology transitioned to oral: Continue 400mg  BID today, 400mg  daily starting 1/30 for 7 days, followed by 200mg  daily thereafter -Xarelto held due to question of compliance and questionable hemoptysis  -Spoke with Dr. , Will hold digoxin for now.  -Continue plavix (for 1 months post PCI), xarelto, aspirin  Elevated troponin/demand ischemia/coronary artery disease -Troponin peaked at 0.46 -Continue aspirin, Plavix, Imdur, hydralazine -Cardiology consulted and appreciated -Home medications of lisinopril, Lasix and Coreg held -Continue statin -Per cardiology, hold ACEi for now  Acute on chronic CHF -Echocardiogram: EF 40-45% -Beta blocker held due to cocaine abuse, patient counseled at length by myself and several other doctors -ACE  inhibitor held due to AKI  Acute kidney injury -Resolved, Creatinine improving, currently 1.18 -Repeat BMP in one week  Hypokalemia -Replaced -Repeat BMP in one week  GERD  -Continue PPI  Constipation -Continue dulcolax   Normocytic Anemia  -Resolved, Hb 13.3 -Continue to monitor CBC  Diabetes mellitus, type II -Hemoglobin A1c 9.9 on 11/19/2015 -Continue lantus, ISS and CBG Monitoring   Anxiety -Continue Xanax  Tooth pain/ head pain/ eye pain -Continue tramadol   Procedures/Significant Events  1/25 Admitted for acute respiratory failure requiring emergent intubation. On nitro drip. 1/25 Echocardiogram 1/26 Started on amio drip for afib with RVR  1/26 Off nitro drip, extubated   Consults  [facility] Cardiology Ophthalmology  Discharge Exam: Filed Vitals:   11/22/15 2157 11/23/15 0528  BP:  150/91  Pulse:  106  Temp: 97.9 F (36.6 C) 98.6 F (37 C)  Resp:  18    Exam  General: Well developed, well nourished, NAD  HEENT: NCAT, mucous membranes moist.   Cardiovascular: S1 S2 auscultated, RRR, no murmurs  Respiratory: Clear to auscultation  Abdomen: Soft, nontender, nondistended, + bowel sounds  Extremities: warm dry without cyanosis clubbing or edema  Neuro: AAOx3, nonfocal  Psych: Appropriate mood and affect  Discharge Instructions      Discharge Instructions    Discharge instructions    Complete by:  As directed   Patient will be discharged to home.  Patient will need to follow up with primary care provider within one week of discharge, repeat BMP.  Follow up with cardiology.  Patient should continue medications as prescribed.  Patient should follow a heart healthy/carb modified diet.  Advised to stop smoking and refrain from cocaine use.            Medication List    STOP taking these medications        digoxin 0.125 MG tablet  Commonly known as:  LANOXIN     lisinopril 20 MG tablet  Commonly known as:  PRINIVIL,ZESTRIL      spironolactone 25 MG tablet  Commonly known as:  ALDACTONE      TAKE these medications        albuterol 108 (90 Base) MCG/ACT inhaler  Commonly known as:  PROVENTIL HFA;VENTOLIN HFA  Inhale 2 puffs into the lungs every 6 (six) hours as needed for wheezing or shortness of breath.     amiodarone 400 MG tablet  Commonly known as:  PACERONE  Take 1 tablet (400 mg total) by mouth daily.  Start taking on:  11/24/2015     aspirin 81 MG chewable tablet  Chew 1 tablet (81 mg total) by mouth daily.     atorvastatin 80 MG tablet  Commonly known as:  LIPITOR  Take 1 tablet (80 mg total) by mouth every evening.     clopidogrel 75 MG tablet  Commonly known as:  PLAVIX  Take 1 tablet (75 mg total) by mouth daily with breakfast.     Dexlansoprazole 30 MG capsule  Commonly known as:  DEXILANT  Take 1 capsule (30 mg total) by mouth daily.     furosemide 40 MG tablet  Commonly known as:  LASIX  Take 1 tablet (40 mg total) by mouth daily.     glipiZIDE 10 MG tablet  Commonly known as:  GLUCOTROL  Take 1 tablet (10 mg total) by mouth 2 (two) times daily before a meal.     hydrALAZINE 25 MG tablet  Commonly known as:  APRESOLINE  Take 1 tablet (25 mg total) by mouth 3 (three) times daily.     ipratropium-albuterol 0.5-2.5 (3) MG/3ML Soln  Commonly  known as:  DUONEB  Take 3 mLs by nebulization 2 (two) times daily.     isosorbide mononitrate 30 MG 24 hr tablet  Commonly known as:  IMDUR  Take 1 tablet (30 mg total) by mouth daily.     metoprolol tartrate 25 MG tablet  Commonly known as:  LOPRESSOR  Take 1 tablet (25 mg total) by mouth 2 (two) times daily.     nitroGLYCERIN 0.4 MG SL tablet  Commonly known as:  NITROSTAT  Place 1 tablet (0.4 mg total) under the tongue every 5 (five) minutes as needed for chest pain (up to 3 doses).     potassium chloride SA 20 MEQ tablet  Commonly known as:  K-DUR,KLOR-CON  Take 1 tablet (20 mEq total) by mouth daily.     rivaroxaban 20 MG Tabs  tablet  Commonly known as:  XARELTO  Take 1 tablet (20 mg total) by mouth daily with supper.     traMADol 50 MG tablet  Commonly known as:  ULTRAM  Take 1 tablet (50 mg total) by mouth every 6 (six) hours as needed for severe pain.       No Known Allergies Follow-up Information    Follow up with [facility] On 11/25/2015.   Why:  [facility] appointment on 11/25/15 at 9:45 am with Dr. Venetia Night.   Contact information:   [address] [place] [address] [phone]      Follow up with Lars Masson, MD. Schedule an appointment as soon as possible for a visit in 1 week.   Specialty:  Cardiology   Why:  Hospital follow up   Contact information:   [address] [place] [address] [phone]       Schedule an appointment as soon as possible for a visit with Marcelline Deist, MD.   Specialty:  Ophthalmology   Why:  As needed, Eye   Contact information:   [address] [place] [address] [phone]        The results of significant diagnostics from this hospitalization (including imaging, microbiology, ancillary and laboratory) are listed below for reference.    Significant Diagnostic Studies: Dg Chest 2 View  11/19/2015  CLINICAL DATA:  Shortness of breath. EXAM: CHEST  2 VIEW COMPARISON:  11/03/2015. FINDINGS: Normal sized heart. Interval mild patchy opacity in the right upper lobe and superior segment of the right lower lobe. Decreased prominence of the interstitial markings. The hila are more prominent, rounded and denser, specially on the right. Mild thoracic spine degenerative changes. IMPRESSION: 1. Interval probable mild pneumonia in the right upper lobe and superior segment of the right lower lobe. 2. Interval possible bilateral hilar adenopathy, greater than on the right. 3. Improving interstitial pulmonary edema. Electronically Signed   By: Beckie Salts M.D.   On:  11/19/2015 07:41   Dg Chest 2 View  11/03/2015  CLINICAL DATA:  Central and RIGHT-sided chest pain or shortness of breath tonight. History of hypertension, diabetes, atrial fibrillation, pneumonia, renal failure, CHF. EXAM: CHEST  2 VIEW COMPARISON:  Chest radiograph October 15, 2015 FINDINGS: Cardiomediastinal silhouette is unremarkable. Diffuse interstitial prominence with patchy airspace opacities, predominant LEFT lung base. No pleural effusion. No pneumothorax. Soft tissue planes and included osseous structures are nonsuspicious. IMPRESSION: Interstitial prominence, with patchy airspace opacities concerning for bronchopneumonia. Followup PA and lateral chest X-ray is recommended in 3-4 weeks following trial of antibiotic therapy to ensure resolution and exclude  underlying malignancy. Electronically Signed   By: Awilda Metro M.D.   On: 11/03/2015 00:35   Ct Head Wo Contrast  11/22/2015  CLINICAL DATA:  Felt a pop in left eye.  Severe headache. EXAM: CT HEAD WITHOUT CONTRAST TECHNIQUE: Contiguous axial images were obtained from the base of the skull through the vertex without intravenous contrast. COMPARISON:  None. FINDINGS: Low-density throughout the deep white matter, likely chronic microvascular disease. No acute intracranial abnormality. Specifically, no hemorrhage, hydrocephalus, mass lesion, acute infarction, or significant intracranial injury. No acute calvarial abnormality. Visualized paranasal sinuses and mastoids clear. Orbital soft tissues unremarkable. IMPRESSION: Low-density throughout the deep white matter, likely chronic small vessel disease. Electronically Signed   By: Charlett Nose M.D.   On: 11/22/2015 10:36   Dg Chest Port 1 View  11/21/2015  CLINICAL DATA:  CHF. EXAM: PORTABLE CHEST 1 VIEW COMPARISON:  11/20/2015.  11/19/2015. FINDINGS: Interim extubation removal of NG tube. Mediastinum and hilar structures normal. Heart size stable. No pulmonary venous congestion. No focal  infiltrate. No pleural effusion or pneumothorax. IMPRESSION: 1. Interim extubation removal of NG tube. 2. No acute cardiopulmonary disease. Interim clearing of previously identified right lung infiltrate. Electronically Signed   By: Maisie Fus  Register   On: 11/21/2015 07:30   Dg Chest Port 1 View  11/20/2015  CLINICAL DATA:  Acute respiratory failure. EXAM: PORTABLE CHEST 1 VIEW COMPARISON:  11/19/2015. FINDINGS: Endotracheal tube and NG tube in stable position. Mediastinum and hilar structures are normal. Heart size normal. Interval near complete clearing of right lung infiltrate. No pleural effusion or pneumothorax. IMPRESSION: 1. Endotracheal tube and NG tube in stable position. 2. Interim near complete clearing of right lung infiltrate. Electronically Signed   By: Maisie Fus  Register   On: 11/20/2015 07:09   Dg Chest Port 1 View  11/19/2015  CLINICAL DATA:  Check endotracheal tube position EXAM: PORTABLE CHEST 1 VIEW COMPARISON:  11/19/2015 . FINDINGS: Endotracheal tube has been advanced and now lies 5.2 cm above the carina in satisfactory position. Improved aeration is noted within the lungs bilaterally but particularly on the left. Some residual changes in the right lung are noted. Cardiac shadow is within normal limits. No other focal abnormality is seen. IMPRESSION: Improved endotracheal tube position. Improved aeration on the left with persistent airspace disease in the right lung. Electronically Signed   By: Alcide Clever M.D.   On: 11/19/2015 16:27   Dg Chest Port 1 View  11/19/2015  CLINICAL DATA:  Respiratory distress.  Endotracheal tube placement. EXAM: PORTABLE CHEST 1 VIEW COMPARISON:  Earlier film, same date. FINDINGS: The endotracheal tube is above the thoracic inlet, 13 cm above the carina. This should be advanced at least 8 cm. Persistent bilateral airspace process. No pleural effusion or pneumothorax. IMPRESSION: The endotracheal tube is 13 cm above the carina and should be advanced at least  8 cm. Persistent bilateral airspace process. Electronically Signed   By: Rudie Meyer M.D.   On: 11/19/2015 13:39   Dg Abd Portable 1v  11/19/2015  CLINICAL DATA:  Orogastric tube placement. Congestive heart failure and atrial fibrillation. Myocardial infarct. EXAM: PORTABLE ABDOMEN - 1 VIEW COMPARISON:  None. FINDINGS: Orogastric tube is seen with tip overlying the distal gastric antrum. No evidence of dilated bowel loops. IMPRESSION: Orogastric tube tip overlies the distal gastric antrum. Electronically Signed   By: Myles Rosenthal M.D.   On: 11/19/2015 18:32   Ct Angio Chest Aorta W/cm &/or Wo/cm  11/03/2015  CLINICAL DATA:  55 year old male  with central chest pain. EXAM: CT ANGIOGRAPHY CHEST WITH CONTRAST TECHNIQUE: Multidetector CT imaging of the chest was performed using the standard protocol during bolus administration of intravenous contrast. Multiplanar CT image reconstructions and MIPs were obtained to evaluate the vascular anatomy. CONTRAST:  OMNIPAQUE IOHEXOL 350 MG/ML SOLN COMPARISON:  Radiograph dated 11/02/2015 FINDINGS: There are small bilateral pleural effusions. There is diffuse interstitial prominence with patchy areas of ground-glass airspace opacity predominantly involving the lower lobes compatible with pneumonia. Clinical correlation and follow-up resolution is recommended. There is no pneumothorax. The central airways are patent. The thoracic aorta appears unremarkable. Evaluation of the pulmonary arteries is limited due to suboptimal opacification of the peripheral branches. No central pulmonary artery embolus identified. There is no cardiomegaly or pericardial effusion. Top-normal bilateral hilar lymph nodes. There is coronary vascular calcification. The visualized esophagus and thyroid gland appear grossly unremarkable. There is no axillary adenopathy. The chest wall soft tissues appear unremarkable. Mild degenerative changes of the spine. No acute fracture. The visualized upper  abdomen is unremarkable. Review of the MIP images confirms the above findings. IMPRESSION: No CT evidence of pulmonary embolism or aortic dissection. Small bilateral pleural effusions and diffuse bilateral ground-glass airspace opacities most compatible with pneumonia. Clinical correlation and follow-up recommended. Electronically Signed   By: Elgie Collard M.D.   On: 11/03/2015 06:49   Ct Orbitss W/o Cm  11/22/2015  CLINICAL DATA:  Felt a pop in left eye. Severe headache. Blurred vision in left eye. Photosensitivity. EXAM: CT ORBITS WITHOUT CONTRAST TECHNIQUE: Multidetector CT imaging of the orbits was performed following the standard protocol without intravenous contrast. COMPARISON:  None. FINDINGS: Orbital soft tissues are unremarkable. Globes are intact. No orbital fracture. Visualized facial bones are intact. Paranasal sinuses are clear. Mastoid air cells are clear. IMPRESSION: No evidence of bony or soft tissue or focal abnormality. Electronically Signed   By: Charlett Nose M.D.   On: 11/22/2015 10:39    Microbiology: Recent Results (from the past 240 hour(s))  Culture, blood (routine x 2) Call MD if unable to obtain prior to antibiotics being given     Status: None (Preliminary result)   Collection Time: 11/19/15 11:30 AM  Result Value Ref Range Status   Specimen Description BLOOD RIGHT ANTECUBITAL  Final   Special Requests BOTTLES DRAWN AEROBIC AND ANAEROBIC 5CC  Final   Culture NO GROWTH 3 DAYS  Final   Report Status PENDING  Incomplete  Culture, blood (routine x 2) Call MD if unable to obtain prior to antibiotics being given     Status: None (Preliminary result)   Collection Time: 11/19/15 11:49 AM  Result Value Ref Range Status   Specimen Description BLOOD RIGHT FOREARM  Final   Special Requests IN PEDIATRIC BOTTLE 2CC  Final   Culture NO GROWTH 3 DAYS  Final   Report Status PENDING  Incomplete  MRSA PCR Screening     Status: None   Collection Time: 11/19/15  8:03 PM  Result  Value Ref Range Status   MRSA by PCR NEGATIVE NEGATIVE Final    Comment:        The GeneXpert MRSA Assay (FDA approved for NASAL specimens only), is one component of a comprehensive MRSA colonization surveillance program. It is not intended to diagnose MRSA infection nor to guide or monitor treatment for MRSA infections.      Labs: Basic Metabolic Panel:  Recent Labs Lab 11/19/15 0804 11/20/15 0338 11/21/15 0333 11/22/15 0257 11/23/15 0400  NA 140 144 140 141  139  K 3.6 3.6 3.2* 3.6 3.4*  CL 104 106 105 108 108  CO2 23 27 25 25 23   GLUCOSE 260* 138* 266* 242* 226*  BUN 22* 18 20 22* 18  CREATININE 1.26* 1.42* 1.52* 1.28* 1.18  CALCIUM 9.1 8.8* 8.4* 8.6* 8.7*  MG  --  1.9 1.9 1.8  --   PHOS  --   --  3.8 2.8  --    Liver Function Tests:  Recent Labs Lab 11/19/15 0804 11/20/15 0338 11/22/15 0257  AST 21 15  --   ALT 23 22  --   ALKPHOS 102 83  --   BILITOT 0.7 0.9  --   PROT 6.8 6.7  --   ALBUMIN 2.8* 2.6* 2.3*   No results for input(s): LIPASE, AMYLASE in the last 168 hours. No results for input(s): AMMONIA in the last 168 hours. CBC:  Recent Labs Lab 11/17/15 1106 11/19/15 0804 11/20/15 0338 11/21/15 0333 11/23/15 0400  WBC 6.6 17.1* 11.1* 9.8 8.7  NEUTROABS  --  15.1*  --  5.9  --   HGB 12.1* 13.7 12.4* 13.3 12.6*  HCT 37.4* 40.1 37.4* 39.5 37.5*  MCV 86.6 85.0 85.6 85.3 85.6  PLT 257 222 219 197 278   Cardiac Enzymes:  Recent Labs Lab 11/19/15 0804 11/19/15 1203 11/19/15 1356 11/19/15 1825  TROPONINI 0.46* 0.41* 0.43* 0.41*   BNP: BNP (last 3 results)  Recent Labs  10/15/15 0350 11/03/15 0004 11/19/15 0804  BNP 606.7* 571.9* 1227.4*    ProBNP (last 3 results) No results for input(s): PROBNP in the last 8760 hours.  CBG:  Recent Labs Lab 11/22/15 0603 11/22/15 1113 11/22/15 1611 11/22/15 2119 11/23/15 0603  GLUCAP 248* 196* 208* 253* 203*       Signed:  11/25/15  [facility] 11/23/2015, 11:32  AM

## 2015-11-23 NOTE — Progress Notes (Signed)
Pt had short burst of a flutter this morning only lasting about a minute.  Pt asymptomatic, RN will cont to monitor pt.  Erenest Rasher, RN

## 2015-11-23 NOTE — Progress Notes (Signed)
Discharged to home with family office visits in place teaching done  

## 2015-11-23 NOTE — Progress Notes (Addendum)
Subjective:  The patient feels good this am, his vision has improved. He went into a-fib with RVR this am but he denies ay chest pain, SOB or palpitations. No hemoptysis.  Marland Kitchen amiodarone  400 mg Oral BID  . aspirin  81 mg Oral Daily  . atorvastatin  80 mg Oral QPM  . chlorhexidine gluconate  15 mL Mouth Rinse BID  . clopidogrel  75 mg Oral Q breakfast  . digoxin  0.0625 mg Oral Daily  . heparin subcutaneous  5,000 Units Subcutaneous 3 times per day  . hydrALAZINE  25 mg Oral TID  . insulin aspart  0-20 Units Subcutaneous TID WC  . insulin aspart  3 Units Subcutaneous TID WC  . insulin glargine  16 Units Subcutaneous QHS  . ipratropium-albuterol  3 mL Nebulization BID  . isosorbide mononitrate  30 mg Oral Daily  . pantoprazole  40 mg Oral Daily  . potassium chloride  40 mEq Oral Once  . sodium chloride flush  3 mL Intravenous Q12H  . sodium chloride flush  3 mL Intravenous Q12H   Objective:  Filed Vitals:   11/22/15 2128 11/22/15 2157 11/23/15 0528 11/23/15 0749  BP:   150/91   Pulse: 96  106   Temp:  97.9 F (36.6 C) 98.6 F (37 C)   TempSrc:  Oral Oral   Resp: 18  18   Height:      Weight:   163 lb (73.936 kg)   SpO2:  95% 91% 93%   Intake/Output from previous day: No intake or output data in the 24 hours ending 11/23/15 0917 Physical Exam: General: NAD Neuro: Alert and oriented X 3. Moves all extremities spontaneously.  Psych: Normal affect. HEENT:  Normal  Neck: Supple without bruits or JVD. Lungs:  Resp regular and unlabored, minimal crackles in the bases. Heart: RRR no s3, s4, or murmurs. Abdomen: Soft, non-tender, non-distended, BS + x 4.  Extremities: No clubbing, cyanosis or edema. DP/PT/Radials 2+ and equal bilaterally.  Lab Results: Basic Metabolic Panel:  Recent Labs  32/44/01 0333 11/22/15 0257 11/23/15 0400  NA 140 141 139  K 3.2* 3.6 3.4*  CL 105 108 108  CO2 25 25 23   GLUCOSE 266* 242* 226*  BUN 20 22* 18  CREATININE 1.52* 1.28* 1.18    CALCIUM 8.4* 8.6* 8.7*  MG 1.9 1.8  --   PHOS 3.8 2.8  --    CBC:  Recent Labs  11/21/15 0333 11/23/15 0400  WBC 9.8 8.7  NEUTROABS 5.9  --   HGB 13.3 12.6*  HCT 39.5 37.5*  MCV 85.3 85.6  PLT 197 278   Cardiac Enzymes: No results for input(s): CKTOTAL, CKMB, CKMBINDEX, TROPONINI in the last 72 hours.   Assessment/Plan:   1 Paroxysmal a-fib with RVR - add metoprolol 25 mg po BID to his regimen, trial of restarting Xarelto with an early follow up considering he had possible TIA yesterday. He is being loaded on amiodarone.  The plan would be to discharge on xarelto, aspirin, plavix until 12/04/15 (1 months post PCI), then discontinue aspirin.  Continue loading with amiodarone 400 mg po BID today, then 400 mg po daily x 7 days then 200 mg po daily.   2. acute pulmonary edema/respiratory failure-Likely precipitated by cocaine use. Pt appears to be euvolemic, improved creatinine. Continue hydralazine 25 mg TID and, continue imdur, hold ACEI.   3. Elevated troponin-there are no diagnostic ST changes on his electrocardiogram. Trend not consistent with acute  coronary syndrome. No plans for further ischemia eval.  4. status post recent PCI-continue aspirin and Plavix. Continue statin.  5.  hypertension-Med adjustments as outlined under number 1, adding metoprolol, he has to stay away from cocaine  6 ischemic cardiomyopathy-adding beta blocker as described above. Hydralazine nitrates.  7 cocaine use-Patient previously counseled on discontinuing.  8 MR-severe on echo; will need fu studies in the future.  9 Hypokalemia-supplement   We will follow.  Lars Masson 11/23/2015, 9:17 AM

## 2015-11-23 NOTE — Discharge Instructions (Signed)

## 2015-11-24 ENCOUNTER — Telehealth: Payer: Self-pay | Admitting: Pharmacist

## 2015-11-24 ENCOUNTER — Telehealth: Payer: Self-pay

## 2015-11-24 LAB — CULTURE, BLOOD (ROUTINE X 2)
CULTURE: NO GROWTH
Culture: NO GROWTH

## 2015-11-24 MED FILL — AMIODARONE HCL 200 MG TAB: 200 | 7 days supply | Qty: 14 | Fill #0

## 2015-11-24 NOTE — Telephone Encounter (Signed)
[facility] Post-discharge Follow-Up Phone Call: Attempt # 1  Date of Discharge: 11/23/2015 Principal Discharge Diagnosis(es): Acute respiratory failure, Aspiration pneumonia, eye pain/blindness, polysubstance abuse Post-discharge Communication: (Clearly document all attempts clearly and date contact made)  Call placed to # [phone] (H) and the patient's mother answered and noted that the patient was not with her.  She stated that she would give him the message and have him return the call.  Call then placed to # [phone] (M) and a HIPAA compliant voice mail message was left requesting a call back to # [phone] or [phone].   Call Completed: Y  N                     .

## 2015-11-24 NOTE — Telephone Encounter (Signed)
Called patient to see if he had received the flu shot this year or if he was interested in getting it. Patient is not interested. Will document in chart.

## 2015-11-25 ENCOUNTER — Encounter: Payer: Self-pay | Admitting: Family Medicine

## 2015-11-25 ENCOUNTER — Ambulatory Visit: Payer: Self-pay | Admitting: Family Medicine

## 2015-11-25 ENCOUNTER — Ambulatory Visit: Payer: Self-pay | Attending: Family Medicine | Admitting: Family Medicine

## 2015-11-25 VITALS — BP 99/68 | HR 93 | Temp 98.7°F | Resp 16 | Ht 70.0 in | Wt 170.0 lb

## 2015-11-25 DIAGNOSIS — I10 Essential (primary) hypertension: Secondary | ICD-10-CM | POA: Insufficient documentation

## 2015-11-25 DIAGNOSIS — H5340 Unspecified visual field defects: Secondary | ICD-10-CM | POA: Insufficient documentation

## 2015-11-25 DIAGNOSIS — Z7901 Long term (current) use of anticoagulants: Secondary | ICD-10-CM | POA: Insufficient documentation

## 2015-11-25 DIAGNOSIS — Z9119 Patient's noncompliance with other medical treatment and regimen: Secondary | ICD-10-CM | POA: Insufficient documentation

## 2015-11-25 DIAGNOSIS — M25562 Pain in left knee: Secondary | ICD-10-CM | POA: Insufficient documentation

## 2015-11-25 DIAGNOSIS — I5022 Chronic systolic (congestive) heart failure: Secondary | ICD-10-CM | POA: Insufficient documentation

## 2015-11-25 DIAGNOSIS — E119 Type 2 diabetes mellitus without complications: Secondary | ICD-10-CM | POA: Insufficient documentation

## 2015-11-25 DIAGNOSIS — I4892 Unspecified atrial flutter: Secondary | ICD-10-CM | POA: Insufficient documentation

## 2015-11-25 DIAGNOSIS — J690 Pneumonitis due to inhalation of food and vomit: Secondary | ICD-10-CM

## 2015-11-25 DIAGNOSIS — I2510 Atherosclerotic heart disease of native coronary artery without angina pectoris: Secondary | ICD-10-CM | POA: Insufficient documentation

## 2015-11-25 DIAGNOSIS — Z79899 Other long term (current) drug therapy: Secondary | ICD-10-CM | POA: Insufficient documentation

## 2015-11-25 DIAGNOSIS — F1410 Cocaine abuse, uncomplicated: Secondary | ICD-10-CM | POA: Insufficient documentation

## 2015-11-25 DIAGNOSIS — W06XXXA Fall from bed, initial encounter: Secondary | ICD-10-CM | POA: Insufficient documentation

## 2015-11-25 DIAGNOSIS — I25118 Atherosclerotic heart disease of native coronary artery with other forms of angina pectoris: Secondary | ICD-10-CM

## 2015-11-25 DIAGNOSIS — Z7982 Long term (current) use of aspirin: Secondary | ICD-10-CM | POA: Insufficient documentation

## 2015-11-25 DIAGNOSIS — Z9114 Patient's other noncompliance with medication regimen: Secondary | ICD-10-CM | POA: Insufficient documentation

## 2015-11-25 DIAGNOSIS — I5023 Acute on chronic systolic (congestive) heart failure: Secondary | ICD-10-CM | POA: Insufficient documentation

## 2015-11-25 LAB — GLUCOSE, POCT (MANUAL RESULT ENTRY): POC GLUCOSE: 380 mg/dL — AB (ref 70–99)

## 2015-11-25 MED ORDER — TRAMADOL HCL 50 MG PO TABS: 50.0000 mg | ORAL_TABLET | Freq: Four times a day (QID) | ORAL | Status: DC | PRN

## 2015-11-25 NOTE — Progress Notes (Signed)
TRANSITIONAL [facility]ARE CLINIC  Date of telephone encounter: 11/23/15  Admit date: 11/19/15 Discharge date: 11/23/15  PCP: Dr Armen Pickup Subjective:  Patient ID: Sean Hinton, male    DOB: 18-May-1961  Age: 55 y.o. MRN: [id number]  CC: Hospitalization Follow-up   HPI Sean Hinton is a 55 year old male with a history of systolic heart failure (EF 30-35%), atrial fibrillation (on anticoagulation with Xarelto), coronary artery disease, hypertension, diabetes mellitus (A1c 9.6), tobacco abuse who comes in to the transitional care clinic for follow-up visit after a recent hospitalization for acute hypercapnic respiratory failure and aspiration pneumonia.  He had presented to the ED with shortness of breath and cough productive of sputum with blood and required intubation and admission to the ICU. Chest x-ray revealed right lung infiltrates and he was placed on IV antibiotics. He did have mildly elevated troponins which peaked at 0.46 and was seen by cardiology; 2-D echo revealed EF of 40-45%, mildly to moderately reduced systolic function, severe mitral valve regurg, severely dilated left atrium increased pulmonary artery pressures. He was on an amiodarone drip for A. fib with RVR. During his hospital course he had complained of bitemporal visual field loss for which he was evaluated by ophthalmology and had a CT scan of the head with no anatomical for his symptoms and was subsequently discharged with recommendations to follow-up with ophthalmology outpatient.  Interval history: Today he complains of left knee pain which he sustained after a fall while getting out of bed during hospitalization and he reports pain while walking but no swelling of the knee joint. He also complains of persisting temporal visual field loss in both eyes and was scheduled to see ophthalmology but missed his appointment. His blood sugar is elevated today at 380 and he admits to taking 5 mg of glipizide twice daily rather than  10 mg because he had an episode of hypoglycemia with sugar of 38.  Outpatient Prescriptions Prior to Visit  Medication Sig Dispense Refill  . albuterol (PROVENTIL HFA;VENTOLIN HFA) 108 (90 BASE) MCG/ACT inhaler Inhale 2 puffs into the lungs every 6 (six) hours as needed for wheezing or shortness of breath. 1 Inhaler 2  . [START ON 12/01/2015] amiodarone (PACERONE) 200 MG tablet Take 1 tablet (200 mg total) by mouth daily. 30 tablet 0  . amiodarone (PACERONE) 400 MG tablet Take 1 tablet (400 mg total) by mouth daily. 7 tablet 0  . aspirin 81 MG chewable tablet Chew 1 tablet (81 mg total) by mouth daily. 30 tablet 0  . atorvastatin (LIPITOR) 80 MG tablet Take 1 tablet (80 mg total) by mouth every evening. 30 tablet 3  . clopidogrel (PLAVIX) 75 MG tablet Take 1 tablet (75 mg total) by mouth daily with breakfast. 30 tablet 0  . Dexlansoprazole (DEXILANT) 30 MG capsule Take 1 capsule (30 mg total) by mouth daily. 90 capsule 3  . furosemide (LASIX) 40 MG tablet Take 1 tablet (40 mg total) by mouth daily. 30 tablet 3  . glipiZIDE (GLUCOTROL) 10 MG tablet Take 1 tablet (10 mg total) by mouth 2 (two) times daily before a meal. (Patient taking differently: Take 5 mg by mouth daily before breakfast. ) 60 tablet 0  . hydrALAZINE (APRESOLINE) 25 MG tablet Take 1 tablet (25 mg total) by mouth 3 (three) times daily. 90 tablet 0  . ipratropium-albuterol (DUONEB) 0.5-2.5 (3) MG/3ML SOLN Take 3 mLs by nebulization 2 (two) times daily. 360 mL 10  . isosorbide mononitrate (IMDUR) 30 MG 24 hr tablet Take 1 tablet (  30 mg total) by mouth daily. 30 tablet 2  . metoprolol tartrate (LOPRESSOR) 25 MG tablet Take 1 tablet (25 mg total) by mouth 2 (two) times daily. 60 tablet 0  . nitroGLYCERIN (NITROSTAT) 0.4 MG SL tablet Place 1 tablet (0.4 mg total) under the tongue every 5 (five) minutes as needed for chest pain (up to 3 doses). 25 tablet 3  . potassium chloride SA (K-DUR,KLOR-CON) 20 MEQ tablet Take 1 tablet (20 mEq total)  by mouth daily. 30 tablet 3  . rivaroxaban (XARELTO) 20 MG TABS tablet Take 1 tablet (20 mg total) by mouth daily with supper. (Patient taking differently: Take 20 mg by mouth daily. ) 30 tablet 3  . traMADol (ULTRAM) 50 MG tablet Take 1 tablet (50 mg total) by mouth every 6 (six) hours as needed for severe pain. 10 tablet 0   No facility-administered medications prior to visit.    ROS Review of Systems  Constitutional: Negative for activity change and appetite change.  HENT: Negative for sinus pressure and sore throat.   Eyes: Positive for visual disturbance.  Respiratory: Negative for cough, chest tightness and shortness of breath.   Cardiovascular: Negative for chest pain and leg swelling.  Gastrointestinal: Negative for abdominal pain, diarrhea, constipation and abdominal distention.  Endocrine: Negative.   Genitourinary: Negative for dysuria.  Musculoskeletal: Negative for myalgias and joint swelling.       Left knee pain  Skin: Negative for rash.  Allergic/Immunologic: Negative.   Neurological: Negative for weakness, light-headedness and numbness.  Psychiatric/Behavioral: Negative for suicidal ideas and dysphoric mood.    Objective:  BP 99/68 mmHg  Pulse 93  Temp(Src) 98.7 F (37.1 C) (Oral)  Resp 16  Ht 5\' 10"  (1.778 m)  Wt 170 lb (77.111 kg)  BMI 24.39 kg/m2  SpO2 96%  BP/Weight 11/25/2015 11/23/2015 11/17/2015  Systolic BP 99 136 110  Diastolic BP 68 80 80  Wt. (Lbs) 170 163 168.2  BMI 24.39 22.1 24.13      Physical Exam  Constitutional: He is oriented to person, place, and time. He appears well-developed and well-nourished.  HENT:  Head: Normocephalic and atraumatic.  Right Ear: External ear normal.  Left Ear: External ear normal.  Eyes: Conjunctivae and EOM are normal. Pupils are equal, round, and reactive to light.  Neck: Normal range of motion. Neck supple. No tracheal deviation present.  Cardiovascular: Normal rate, regular rhythm and normal heart  sounds.   No murmur heard. Pulmonary/Chest: Effort normal and breath sounds normal. No respiratory distress. He has no wheezes. He exhibits no tenderness.  Abdominal: Soft. Bowel sounds are normal. He exhibits no mass. There is no tenderness.  Musculoskeletal: Normal range of motion. He exhibits tenderness (tenderness on range of motion of the left knee). He exhibits no edema.  Neurological: He is alert and oriented to person, place, and time.  Skin: Skin is warm and dry.  Psychiatric: He has a normal mood and affect.   CLINICAL DATA: Felt a pop in left eye. Severe headache.  EXAM: CT HEAD WITHOUT CONTRAST  TECHNIQUE: Contiguous axial images were obtained from the base of the skull through the vertex without intravenous contrast.  COMPARISON: None.  FINDINGS: Low-density throughout the deep white matter, likely chronic microvascular disease. No acute intracranial abnormality. Specifically, no hemorrhage, hydrocephalus, mass lesion, acute infarction, or significant intracranial injury. No acute calvarial abnormality. Visualized paranasal sinuses and mastoids clear. Orbital soft tissues unremarkable.  IMPRESSION: Low-density throughout the deep white matter, likely chronic small vessel disease.  Electronically Signed  By: Charlett Nose M.D.  On: 11/22/2015 10:36   Assessment & Plan:   1. Diabetes mellitus without complication (HCC) A1c 9.9 from 10/2014, CBG is 318 (he had breakfast 2 hours ago) Poor glycemic control partly due to noncompliance as the patient is taking 5 rather than 10 mg of glipizide. Advised to take 10 mg of glipizide and I will review his blood sugar log at his next visit. - Glucose (CBG)  2. Essential hypertension Controlled  3. Atrial flutter with rapid ventricular response (HCC) Currently on amiodarone  4. Coronary artery disease involving native coronary artery of native heart with other form of angina pectoris (HCC) Stable  5.  Acute on chronic systolic CHF (congestive heart failure) (HCC) EF of 40-45%, mildly to moderately reduced systolic function from 2-D echo 10/2015  6. Cocaine abuse Patient counseled on cessation  7. Left knee pain Apply ice Left knee brace provided - traMADol (ULTRAM) 50 MG tablet; Take 1 tablet (50 mg total) by mouth every 6 (six) hours as needed for severe pain.  Dispense: 30 tablet; Refill: 0  8. Aspiration pneumonia of right upper lobe, unspecified aspiration pneumonia type (HCC) Resolved  9. Visual field loss He missed his appointment with ophthalmology and has been advised to call them to reschedule   Meds ordered this encounter  Medications  . traMADol (ULTRAM) 50 MG tablet    Sig: Take 1 tablet (50 mg total) by mouth every 6 (six) hours as needed for severe pain.    Dispense:  30 tablet    Refill:  0    Follow-up: Return in about 1 week (around 12/02/2015) for TCC-follow-up on diabetes mellitus.   Jaclyn Shaggy MD

## 2015-11-25 NOTE — Progress Notes (Signed)
Patient's here for TCC f/up for Hospitalization. Patient reports having pain today, rated at 8/10.  Patient reports syncope on Saturday with injury to left knee with swelling and pain.  Patient also report pain in head on right side causing a throbbing sensation that radiates down the base of his neck and wraps around to the shoulders.

## 2015-11-25 NOTE — Patient Instructions (Signed)
Diabetes Mellitus and Food It is important for you to manage your blood sugar (glucose) level. Your blood glucose level can be greatly affected by what you eat. Eating healthier foods in the appropriate amounts throughout the day at about the same time each day will help you control your blood glucose level. It can also help slow or prevent worsening of your diabetes mellitus. Healthy eating may even help you improve the level of your blood pressure and reach or maintain a healthy weight.  General recommendations for healthful eating and cooking habits include:  Eating meals and snacks regularly. Avoid going long periods of time without eating to lose weight.  Eating a diet that consists mainly of plant-based foods, such as fruits, vegetables, nuts, legumes, and whole grains.  Using low-heat cooking methods, such as baking, instead of high-heat cooking methods, such as deep frying. Work with your dietitian to make sure you understand how to use the Nutrition Facts information on food labels. HOW CAN FOOD AFFECT ME? Carbohydrates Carbohydrates affect your blood glucose level more than any other type of food. Your dietitian will help you determine how many carbohydrates to eat at each meal and teach you how to count carbohydrates. Counting carbohydrates is important to keep your blood glucose at a healthy level, especially if you are using insulin or taking certain medicines for diabetes mellitus. Alcohol Alcohol can cause sudden decreases in blood glucose (hypoglycemia), especially if you use insulin or take certain medicines for diabetes mellitus. Hypoglycemia can be a life-threatening condition. Symptoms of hypoglycemia (sleepiness, dizziness, and disorientation) are similar to symptoms of having too much alcohol.  If your health care provider has given you approval to drink alcohol, do so in moderation and use the following guidelines:  Women should not have more than one drink per day, and men  should not have more than two drinks per day. One drink is equal to:  12 oz of beer.  5 oz of wine.  1 oz of hard liquor.  Do not drink on an empty stomach.  Keep yourself hydrated. Have water, diet soda, or unsweetened iced tea.  Regular soda, juice, and other mixers might contain a lot of carbohydrates and should be counted. WHAT FOODS ARE NOT RECOMMENDED? As you make food choices, it is important to remember that all foods are not the same. Some foods have fewer nutrients per serving than other foods, even though they might have the same number of calories or carbohydrates. It is difficult to get your body what it needs when you eat foods with fewer nutrients. Examples of foods that you should avoid that are high in calories and carbohydrates but low in nutrients include:  Trans fats (most processed foods list trans fats on the Nutrition Facts label).  Regular soda.  Juice.  Candy.  Sweets, such as cake, pie, doughnuts, and cookies.  Fried foods. WHAT FOODS CAN I EAT? Eat nutrient-rich foods, which will nourish your body and keep you healthy. The food you should eat also will depend on several factors, including:  The calories you need.  The medicines you take.  Your weight.  Your blood glucose level.  Your blood pressure level.  Your cholesterol level. You should eat a variety of foods, including:  Protein.  Lean cuts of meat.  Proteins low in saturated fats, such as fish, egg whites, and beans. Avoid processed meats.  Fruits and vegetables.  Fruits and vegetables that may help control blood glucose levels, such as apples, mangoes, and   yams.  Dairy products.  Choose fat-free or low-fat dairy products, such as milk, yogurt, and cheese.  Grains, bread, pasta, and rice.  Choose whole grain products, such as multigrain bread, whole oats, and brown rice. These foods may help control blood pressure.  Fats.  Foods containing healthful fats, such as nuts,  avocado, olive oil, canola oil, and fish. DOES EVERYONE WITH DIABETES MELLITUS HAVE THE SAME MEAL PLAN? Because every person with diabetes mellitus is different, there is not one meal plan that works for everyone. It is very important that you meet with a dietitian who will help you create a meal plan that is just right for you.   This information is not intended to replace advice given to you by your health care provider. Make sure you discuss any questions you have with your health care provider.   Document Released: 07/08/2005 Document Revised: 11/01/2014 Document Reviewed: 09/07/2013 [employer] Interactive Patient Education 2016 [employer]  

## 2015-11-30 ENCOUNTER — Emergency Department (HOSPITAL_COMMUNITY): Payer: Self-pay

## 2015-11-30 ENCOUNTER — Inpatient Hospital Stay (HOSPITAL_COMMUNITY)
Admission: EM | Admit: 2015-11-30 | Discharge: 2015-12-01 | DRG: 291 | Disposition: A | Discharge status: Home / Self Care | Payer: Self-pay | Attending: Internal Medicine | Admitting: Internal Medicine

## 2015-11-30 ENCOUNTER — Encounter (HOSPITAL_COMMUNITY): Payer: Self-pay | Admitting: Emergency Medicine

## 2015-11-30 DIAGNOSIS — Z7984 Long term (current) use of oral hypoglycemic drugs: Secondary | ICD-10-CM

## 2015-11-30 DIAGNOSIS — I10 Essential (primary) hypertension: Secondary | ICD-10-CM | POA: Diagnosis present

## 2015-11-30 DIAGNOSIS — E785 Hyperlipidemia, unspecified: Secondary | ICD-10-CM | POA: Diagnosis present

## 2015-11-30 DIAGNOSIS — F1410 Cocaine abuse, uncomplicated: Secondary | ICD-10-CM | POA: Diagnosis present

## 2015-11-30 DIAGNOSIS — I5043 Acute on chronic combined systolic (congestive) and diastolic (congestive) heart failure: Principal | ICD-10-CM | POA: Diagnosis present

## 2015-11-30 DIAGNOSIS — I2510 Atherosclerotic heart disease of native coronary artery without angina pectoris: Secondary | ICD-10-CM | POA: Diagnosis present

## 2015-11-30 DIAGNOSIS — Z7901 Long term (current) use of anticoagulants: Secondary | ICD-10-CM

## 2015-11-30 DIAGNOSIS — I248 Other forms of acute ischemic heart disease: Secondary | ICD-10-CM | POA: Diagnosis present

## 2015-11-30 DIAGNOSIS — I255 Ischemic cardiomyopathy: Secondary | ICD-10-CM | POA: Diagnosis present

## 2015-11-30 DIAGNOSIS — I4892 Unspecified atrial flutter: Secondary | ICD-10-CM | POA: Diagnosis present

## 2015-11-30 DIAGNOSIS — I252 Old myocardial infarction: Secondary | ICD-10-CM

## 2015-11-30 DIAGNOSIS — I480 Paroxysmal atrial fibrillation: Secondary | ICD-10-CM | POA: Diagnosis present

## 2015-11-30 DIAGNOSIS — E1169 Type 2 diabetes mellitus with other specified complication: Secondary | ICD-10-CM | POA: Diagnosis present

## 2015-11-30 DIAGNOSIS — I5020 Unspecified systolic (congestive) heart failure: Secondary | ICD-10-CM

## 2015-11-30 DIAGNOSIS — I5023 Acute on chronic systolic (congestive) heart failure: Secondary | ICD-10-CM

## 2015-11-30 DIAGNOSIS — I340 Nonrheumatic mitral (valve) insufficiency: Secondary | ICD-10-CM | POA: Diagnosis present

## 2015-11-30 DIAGNOSIS — J9601 Acute respiratory failure with hypoxia: Secondary | ICD-10-CM | POA: Diagnosis present

## 2015-11-30 DIAGNOSIS — R0602 Shortness of breath: Secondary | ICD-10-CM

## 2015-11-30 DIAGNOSIS — I509 Heart failure, unspecified: Secondary | ICD-10-CM | POA: Insufficient documentation

## 2015-11-30 DIAGNOSIS — R0603 Acute respiratory distress: Secondary | ICD-10-CM

## 2015-11-30 DIAGNOSIS — E1159 Type 2 diabetes mellitus with other circulatory complications: Secondary | ICD-10-CM | POA: Diagnosis present

## 2015-11-30 DIAGNOSIS — Z7982 Long term (current) use of aspirin: Secondary | ICD-10-CM

## 2015-11-30 DIAGNOSIS — F17210 Nicotine dependence, cigarettes, uncomplicated: Secondary | ICD-10-CM | POA: Diagnosis present

## 2015-11-30 DIAGNOSIS — E119 Type 2 diabetes mellitus without complications: Secondary | ICD-10-CM

## 2015-11-30 LAB — BASIC METABOLIC PANEL
Anion gap: 11 (ref 5–15)
BUN: 19 mg/dL (ref 6–20)
CHLORIDE: 100 mmol/L — AB (ref 101–111)
CO2: 26 mmol/L (ref 22–32)
CREATININE: 1.13 mg/dL (ref 0.61–1.24)
Calcium: 9 mg/dL (ref 8.9–10.3)
Glucose, Bld: 237 mg/dL — ABNORMAL HIGH (ref 65–99)
POTASSIUM: 3.5 mmol/L (ref 3.5–5.1)
SODIUM: 137 mmol/L (ref 135–145)

## 2015-11-30 LAB — CBC WITH DIFFERENTIAL/PLATELET
Basophils Absolute: 0 10*3/uL (ref 0.0–0.1)
Basophils Relative: 0 %
EOS PCT: 2 %
Eosinophils Absolute: 0.3 10*3/uL (ref 0.0–0.7)
HCT: 34.7 % — ABNORMAL LOW (ref 39.0–52.0)
HEMOGLOBIN: 11.7 g/dL — AB (ref 13.0–17.0)
LYMPHS ABS: 2.1 10*3/uL (ref 0.7–4.0)
LYMPHS PCT: 15 %
MCH: 28.9 pg (ref 26.0–34.0)
MCHC: 33.7 g/dL (ref 30.0–36.0)
MCV: 85.7 fL (ref 78.0–100.0)
Monocytes Absolute: 1.2 10*3/uL — ABNORMAL HIGH (ref 0.1–1.0)
Monocytes Relative: 9 %
NEUTROS PCT: 74 %
Neutro Abs: 10.2 10*3/uL — ABNORMAL HIGH (ref 1.7–7.7)
Platelets: 243 10*3/uL (ref 150–400)
RBC: 4.05 MIL/uL — AB (ref 4.22–5.81)
RDW: 14.6 % (ref 11.5–15.5)
WBC: 13.7 10*3/uL — AB (ref 4.0–10.5)

## 2015-11-30 LAB — COMPREHENSIVE METABOLIC PANEL
ALBUMIN: 2.9 g/dL — AB (ref 3.5–5.0)
ALK PHOS: 148 U/L — AB (ref 38–126)
ALT: 41 U/L (ref 17–63)
ANION GAP: 9 (ref 5–15)
AST: 43 U/L — ABNORMAL HIGH (ref 15–41)
BILIRUBIN TOTAL: 0.6 mg/dL (ref 0.3–1.2)
BUN: 22 mg/dL — ABNORMAL HIGH (ref 6–20)
CALCIUM: 9 mg/dL (ref 8.9–10.3)
CO2: 22 mmol/L (ref 22–32)
Chloride: 107 mmol/L (ref 101–111)
Creatinine, Ser: 1.22 mg/dL (ref 0.61–1.24)
GFR calc non Af Amer: >60 mL/min (ref >60)
Glucose, Bld: 337 mg/dL — ABNORMAL HIGH (ref 65–99)
POTASSIUM: 4.2 mmol/L (ref 3.5–5.1)
SODIUM: 138 mmol/L (ref 135–145)
TOTAL PROTEIN: 7.3 g/dL (ref 6.5–8.1)

## 2015-11-30 LAB — BRAIN NATRIURETIC PEPTIDE: B NATRIURETIC PEPTIDE 5: 870.4 pg/mL — AB (ref 0.0–100.0)

## 2015-11-30 LAB — I-STAT CHEM 8, ED
BUN: 21 mg/dL — ABNORMAL HIGH (ref 6–20)
CALCIUM ION: 1.17 mmol/L (ref 1.12–1.23)
Chloride: 105 mmol/L (ref 101–111)
Creatinine, Ser: 1 mg/dL (ref 0.61–1.24)
Glucose, Bld: 327 mg/dL — ABNORMAL HIGH (ref 65–99)
HEMATOCRIT: 40 % (ref 39.0–52.0)
Hemoglobin: 13.6 g/dL (ref 13.0–17.0)
Potassium: 4.1 mmol/L (ref 3.5–5.1)
SODIUM: 140 mmol/L (ref 135–145)
TCO2: 22 mmol/L (ref 0–100)

## 2015-11-30 LAB — RAPID URINE DRUG SCREEN, HOSP PERFORMED
Amphetamines: NOT DETECTED
Barbiturates: NOT DETECTED
Benzodiazepines: NOT DETECTED
COCAINE: POSITIVE — AB
OPIATES: NOT DETECTED
Tetrahydrocannabinol: NOT DETECTED

## 2015-11-30 LAB — I-STAT CG4 LACTIC ACID, ED
Lactic Acid, Venous: 1.81 mmol/L (ref 0.5–2.0)
Lactic Acid, Venous: 1.19 mmol/L (ref 0.5–2.0)

## 2015-11-30 LAB — BLOOD GAS, VENOUS
Acid-base deficit: 1.8 mmol/L (ref 0.0–2.0)
BICARBONATE: 22.4 meq/L (ref 20.0–24.0)
Delivery systems: POSITIVE
FIO2: 1
MODE: POSITIVE
O2 SAT: 71.5 %
PCO2 VEN: 38.4 mmHg — AB (ref 45.0–50.0)
PH VEN: 7.384 — AB (ref 7.250–7.300)
PO2 VEN: 42.5 mmHg (ref 30.0–45.0)
Patient temperature: 98.6
TCO2: 20.1 mmol/L (ref 0–100)

## 2015-11-30 LAB — LIPASE, BLOOD: LIPASE: 15 U/L (ref 11–51)

## 2015-11-30 LAB — GLUCOSE, CAPILLARY: GLUCOSE-CAPILLARY: 472 mg/dL — AB (ref 65–99)

## 2015-11-30 LAB — I-STAT TROPONIN, ED: Troponin i, poc: 0.18 ng/mL (ref 0.00–0.08)

## 2015-11-30 MED ORDER — ATORVASTATIN CALCIUM 40 MG PO TABS
80.0000 mg | ORAL_TABLET | Freq: Every evening | ORAL | Status: DC
  Administered 2015-11-30: 80 mg via ORAL
  Filled 2015-11-30: qty 2

## 2015-11-30 MED ORDER — AMIODARONE HCL 200 MG PO TABS
400.0000 mg | ORAL_TABLET | Freq: Every day | ORAL | Status: AC
  Administered 2015-11-30: 400 mg via ORAL
  Filled 2015-11-30: qty 2

## 2015-11-30 MED ORDER — GLIPIZIDE 10 MG PO TABS
10.0000 mg | ORAL_TABLET | Freq: Two times a day (BID) | ORAL | Status: DC
  Administered 2015-11-30 – 2015-12-01 (×2): 10 mg via ORAL
  Filled 2015-11-30 (×4): qty 1

## 2015-11-30 MED ORDER — AMIODARONE HCL 200 MG PO TABS
200.0000 mg | ORAL_TABLET | Freq: Every day | ORAL | Status: DC
  Administered 2015-12-01: 200 mg via ORAL
  Filled 2015-11-30: qty 1

## 2015-11-30 MED ORDER — AMIODARONE HCL 200 MG PO TABS: 400.0000 mg | ORAL_TABLET | Freq: Every day | ORAL | Status: DC

## 2015-11-30 MED ORDER — RIVAROXABAN 20 MG PO TABS
20.0000 mg | ORAL_TABLET | Freq: Every day | ORAL | Status: DC
  Administered 2015-11-30: 20 mg via ORAL
  Filled 2015-11-30: qty 1

## 2015-11-30 MED ORDER — TRAMADOL HCL 50 MG PO TABS: 50.0000 mg | ORAL_TABLET | Freq: Four times a day (QID) | ORAL | Status: DC | PRN

## 2015-11-30 MED ORDER — IPRATROPIUM-ALBUTEROL 0.5-2.5 (3) MG/3ML IN SOLN
3.0000 mL | Freq: Two times a day (BID) | RESPIRATORY_TRACT | Status: DC
  Administered 2015-11-30 – 2015-12-01 (×3): 3 mL via RESPIRATORY_TRACT
  Filled 2015-11-30 (×3): qty 3

## 2015-11-30 MED ORDER — FUROSEMIDE 10 MG/ML IJ SOLN
40.0000 mg | Freq: Four times a day (QID) | INTRAMUSCULAR | Status: AC
  Administered 2015-11-30 (×2): 40 mg via INTRAVENOUS
  Filled 2015-11-30 (×2): qty 4

## 2015-11-30 MED ORDER — POTASSIUM CHLORIDE CRYS ER 20 MEQ PO TBCR
20.0000 meq | EXTENDED_RELEASE_TABLET | Freq: Every day | ORAL | Status: DC
  Administered 2015-11-30 – 2015-12-01 (×2): 20 meq via ORAL
  Filled 2015-11-30 (×2): qty 1

## 2015-11-30 MED ORDER — FUROSEMIDE 10 MG/ML IJ SOLN
40.0000 mg | Freq: Once | INTRAMUSCULAR | Status: AC
Start: 2015-11-30 — End: 2015-11-30
  Administered 2015-11-30: 40 mg via INTRAVENOUS
  Filled 2015-11-30: qty 4

## 2015-11-30 MED ORDER — LORAZEPAM 2 MG/ML IJ SOLN
0.5000 mg | Freq: Once | INTRAMUSCULAR | Status: AC
  Administered 2015-11-30: 0.5 mg via INTRAVENOUS
  Filled 2015-11-30: qty 1

## 2015-11-30 MED ORDER — NITROGLYCERIN 0.4 MG SL SUBL: 0.4000 mg | SUBLINGUAL_TABLET | SUBLINGUAL | Status: DC | PRN

## 2015-11-30 MED ORDER — AMIODARONE HCL 200 MG PO TABS: 200.0000 mg | ORAL_TABLET | Freq: Every day | ORAL | Status: DC

## 2015-11-30 MED ORDER — FUROSEMIDE 10 MG/ML IJ SOLN
40.0000 mg | Freq: Once | INTRAMUSCULAR | Status: AC
  Administered 2015-11-30: 40 mg via INTRAVENOUS
  Filled 2015-11-30: qty 4

## 2015-11-30 MED ORDER — IOHEXOL 350 MG/ML SOLN
100.0000 mL | Freq: Once | INTRAVENOUS | Status: AC | PRN
  Administered 2015-11-30: 100 mL via INTRAVENOUS

## 2015-11-30 MED ORDER — PIPERACILLIN-TAZOBACTAM 3.375 G IVPB 30 MIN
3.3750 g | Freq: Once | INTRAVENOUS | Status: AC
  Administered 2015-11-30: 3.375 g via INTRAVENOUS
  Filled 2015-11-30: qty 50

## 2015-11-30 MED ORDER — BACID PO TABS: 2.0000 | ORAL_TABLET | Freq: Three times a day (TID) | ORAL | Status: DC

## 2015-11-30 MED ORDER — SODIUM CHLORIDE 0.9 % IV SOLN: 250.0000 mL | INTRAVENOUS | Status: DC | PRN

## 2015-11-30 MED ORDER — HYDRALAZINE HCL 25 MG PO TABS
25.0000 mg | ORAL_TABLET | Freq: Three times a day (TID) | ORAL | Status: DC
  Administered 2015-11-30 – 2015-12-01 (×4): 25 mg via ORAL
  Filled 2015-11-30 (×4): qty 1

## 2015-11-30 MED ORDER — ISOSORBIDE MONONITRATE ER 30 MG PO TB24
30.0000 mg | ORAL_TABLET | Freq: Every day | ORAL | Status: DC
  Administered 2015-11-30 – 2015-12-01 (×2): 30 mg via ORAL
  Filled 2015-11-30 (×2): qty 1

## 2015-11-30 MED ORDER — ALBUTEROL SULFATE (2.5 MG/3ML) 0.083% IN NEBU
5.0000 mg | INHALATION_SOLUTION | Freq: Once | RESPIRATORY_TRACT | Status: AC
  Administered 2015-11-30: 5 mg via RESPIRATORY_TRACT
  Filled 2015-11-30: qty 6

## 2015-11-30 MED ORDER — INSULIN ASPART 100 UNIT/ML ~~LOC~~ SOLN: 0.0000 [IU] | Freq: Every day | SUBCUTANEOUS | Status: DC

## 2015-11-30 MED ORDER — VANCOMYCIN HCL IN DEXTROSE 1-5 GM/200ML-% IV SOLN
1000.0000 mg | Freq: Once | INTRAVENOUS | Status: AC
  Administered 2015-11-30: 1000 mg via INTRAVENOUS
  Filled 2015-11-30: qty 200

## 2015-11-30 MED ORDER — ASPIRIN 81 MG PO CHEW
81.0000 mg | CHEWABLE_TABLET | Freq: Every day | ORAL | Status: DC
  Administered 2015-11-30 – 2015-12-01 (×2): 81 mg via ORAL
  Filled 2015-11-30 (×2): qty 1

## 2015-11-30 MED ORDER — LACTINEX PO CHEW
2.0000 | CHEWABLE_TABLET | Freq: Three times a day (TID) | ORAL | Status: DC
  Administered 2015-11-30 – 2015-12-01 (×4): 2 via ORAL
  Filled 2015-11-30 (×6): qty 2

## 2015-11-30 MED ORDER — PANTOPRAZOLE SODIUM 40 MG PO TBEC
40.0000 mg | DELAYED_RELEASE_TABLET | Freq: Every day | ORAL | Status: DC
  Administered 2015-11-30 – 2015-12-01 (×2): 40 mg via ORAL
  Filled 2015-11-30 (×2): qty 1

## 2015-11-30 MED ORDER — INSULIN ASPART 100 UNIT/ML ~~LOC~~ SOLN
0.0000 [IU] | Freq: Three times a day (TID) | SUBCUTANEOUS | Status: DC
  Administered 2015-11-30: 8 [IU] via SUBCUTANEOUS
  Administered 2015-11-30: 15 [IU] via SUBCUTANEOUS
  Administered 2015-12-01: 3 [IU] via SUBCUTANEOUS
  Administered 2015-12-01: 5 [IU] via SUBCUTANEOUS

## 2015-11-30 MED ORDER — LEVOFLOXACIN 750 MG PO TABS
750.0000 mg | ORAL_TABLET | Freq: Every day | ORAL | Status: DC
  Administered 2015-11-30 – 2015-12-01 (×2): 750 mg via ORAL
  Filled 2015-11-30 (×2): qty 1

## 2015-11-30 MED ORDER — METOPROLOL TARTRATE 25 MG PO TABS
25.0000 mg | ORAL_TABLET | Freq: Two times a day (BID) | ORAL | Status: DC
  Administered 2015-11-30 – 2015-12-01 (×3): 25 mg via ORAL
  Filled 2015-11-30 (×3): qty 1

## 2015-11-30 MED ORDER — CLOPIDOGREL BISULFATE 75 MG PO TABS
75.0000 mg | ORAL_TABLET | Freq: Every day | ORAL | Status: DC
  Administered 2015-12-01: 75 mg via ORAL
  Filled 2015-11-30: qty 1

## 2015-11-30 MED ORDER — ALBUTEROL SULFATE (2.5 MG/3ML) 0.083% IN NEBU: 3.0000 mL | INHALATION_SOLUTION | Freq: Four times a day (QID) | RESPIRATORY_TRACT | Status: DC | PRN

## 2015-11-30 NOTE — ED Provider Notes (Signed)
CSN: [id number]     Arrival date & time 11/30/15  0243 History  By signing my name below, I, Bethel Born, attest that this documentation has been prepared under the direction and in the presence of Zakaree Mcclenahan, MD. Electronically Signed: Bethel Born, ED Scribe. 11/30/2015. 3:53 AM   Chief Complaint  Patient presents with  . Shortness of Breath   Level V caveat secondary to the acuity of the presenting condition and respiratory distress  Patient is a 55 y.o. male presenting with shortness of breath. The history is provided by the patient. No language interpreter was used.  Shortness of Breath Severity:  Severe Onset quality:  Gradual Duration:  2 weeks Timing:  Constant Progression:  Worsening Chronicity:  New Relieved by:  Nothing Worsened by:  Nothing tried Risk factors: tobacco use   Risk factors: no recent alcohol use    Chandlor Holladay is a 55 y.o. male with history of NSTEMI  s/p coronary stent placement on 11/03/15, CHF,  a-fib/flutter, ischemic cardiomyopathy, HTN, type II DM, HLD who presents to the Emergency Department complaining of worsening SOB with onset 2 weeks ago. Pt was discharged 7 days ago after hospitalization for acute hypercapnic respiratory failure and aspiration pneumonia. Associated symptoms include ongoing cough productive of blood tinged green sputum and abdominal pain since he was intubated 2 weeks ago. Pt is a smoker but notes that he has reduced his tobacco use recently. Pt denies alcohol use.   Past Medical History  Diagnosis Date  . Diabetes mellitus with complication (HCC)   . Hypertension   . Syncope     a. 02/2015 - felt 2/2 rapid AF/AFL.  Marland Kitchen Atrial fibrillation and flutter (HCC)     a. Dx 02/2015 - placed on Xarelto; b. 09/2015 recurrence in setting of PNA.  . Ischemic cardiomyopathy     a. 09/2015 Echo: EF 35-40%.  Marland Kitchen CAD (coronary artery disease)     a. Dx 02/2015 - 2V CAD with CTO LAD, diffuse dz in PDA, OM - med rx.  . Hypertensive heart  disease   . Hyperlipidemia   . Mitral regurgitation     a. Mod by echo 02/2015,  . Tobacco abuse   . Chronic systolic CHF (congestive heart failure) (HCC)     a. 09/2015 Echo: EF 35-40%, sev apical/antlat, apical HK, mild MR, mildly dil LA.  Marland Kitchen ARF (acute respiratory failure) (HCC)   . Pneumonia 09/2015  . GERD (gastroesophageal reflux disease)   . Headache   . Arthritis     ra  . NSTEMI (non-ST elevated myocardial infarction) (HCC) 10/2015   Past Surgical History  Procedure Laterality Date  . Right thumb surgery     . Cardiac catheterization N/A 02/24/2015    Procedure: Left Heart Cath and Coronary Angiography;  Surgeon: Marykay Lex, MD;  Location: [facility] INVASIVE CV LAB CUPID;  Service: Cardiovascular;  Laterality: N/A;  . Cardiac catheterization  02/24/2015    Procedure: Coronary/Bypass Graft CTO Intervention;  Surgeon: Marykay Lex, MD;  Location: [facility] INVASIVE CV LAB CUPID;  Service: Cardiovascular;;  . Cardiac catheterization N/A 11/03/2015    Procedure: Left Heart Cath and Coronary Angiography;  Surgeon: Tonny Bollman, MD;  Location: [facility] INVASIVE CV LAB;  Service: Cardiovascular;  Laterality: N/A;  . Cardiac catheterization N/A 11/03/2015    Procedure: Coronary Stent Intervention;  Surgeon: Tonny Bollman, MD;  Location: [facility] INVASIVE CV LAB;  Service: Cardiovascular;  Laterality: N/A;  RAMUS   Family History  Problem Relation Age of  Onset  . Diabetes Mother   . Hypertension Mother   . Heart disease Mother   . Cancer Mother     stomach cancer   . Diabetes Father   . Hypertension Father   . Heart disease Father   . Diabetes Sister   . Hypertension Sister   . Cancer Sister     breast cancer   . Diabetes Brother   . Hypertension Brother   . Heart disease Brother   . Stroke Brother   . Heart attack Mother   . Heart attack Father   . Heart attack Brother    Social History  Substance Use Topics  . Smoking status: Current Every Day Smoker -- 0.25 packs/day for 30 years     Types: Cigarettes  . Smokeless tobacco: Never Used  . Alcohol Use: No    Review of Systems  Unable to perform ROS: Acuity of condition    Allergies  Review of patient's allergies indicates no known allergies.  Home Medications   Prior to Admission medications   Medication Sig Start Date End Date Taking? Authorizing Provider  albuterol (PROVENTIL HFA;VENTOLIN HFA) 108 (90 BASE) MCG/ACT inhaler Inhale 2 puffs into the lungs every 6 (six) hours as needed for wheezing or shortness of breath. 10/17/15   Ripudeep Jenna Luo, MD  amiodarone (PACERONE) 200 MG tablet Take 1 tablet (200 mg total) by mouth daily. 12/01/15   Maryann Mikhail, DO  amiodarone (PACERONE) 400 MG tablet Take 1 tablet (400 mg total) by mouth daily. 11/24/15 11/30/15  Maryann Mikhail, DO  aspirin 81 MG chewable tablet Chew 1 tablet (81 mg total) by mouth daily. 11/06/15   Joseph Art, DO  atorvastatin (LIPITOR) 80 MG tablet Take 1 tablet (80 mg total) by mouth every evening. 10/17/15   Ripudeep Jenna Luo, MD  clopidogrel (PLAVIX) 75 MG tablet Take 1 tablet (75 mg total) by mouth daily with breakfast. 11/06/15   Joseph Art, DO  Dexlansoprazole (DEXILANT) 30 MG capsule Take 1 capsule (30 mg total) by mouth daily. 12/30/14   Josalyn Funches, MD  furosemide (LASIX) 40 MG tablet Take 1 tablet (40 mg total) by mouth daily. 02/25/15   Dayna N Dunn, PA-C  glipiZIDE (GLUCOTROL) 10 MG tablet Take 1 tablet (10 mg total) by mouth 2 (two) times daily before a meal. Patient taking differently: Take 5 mg by mouth daily before breakfast.  11/06/15   Joseph Art, DO  hydrALAZINE (APRESOLINE) 25 MG tablet Take 1 tablet (25 mg total) by mouth 3 (three) times daily. 11/23/15   Maryann Mikhail, DO  ipratropium-albuterol (DUONEB) 0.5-2.5 (3) MG/3ML SOLN Take 3 mLs by nebulization 2 (two) times daily. 10/17/15   Ripudeep Jenna Luo, MD  isosorbide mononitrate (IMDUR) 30 MG 24 hr tablet Take 1 tablet (30 mg total) by mouth daily. 11/11/15   Jaclyn Shaggy, MD   metoprolol tartrate (LOPRESSOR) 25 MG tablet Take 1 tablet (25 mg total) by mouth 2 (two) times daily. 11/23/15   Maryann Mikhail, DO  nitroGLYCERIN (NITROSTAT) 0.4 MG SL tablet Place 1 tablet (0.4 mg total) under the tongue every 5 (five) minutes as needed for chest pain (up to 3 doses). 02/25/15   Dayna N Dunn, PA-C  potassium chloride SA (K-DUR,KLOR-CON) 20 MEQ tablet Take 1 tablet (20 mEq total) by mouth daily. 10/17/15   Ripudeep Jenna Luo, MD  rivaroxaban (XARELTO) 20 MG TABS tablet Take 1 tablet (20 mg total) by mouth daily with supper. Patient taking differently: Take 20 mg  by mouth daily.  02/25/15   Dayna N Dunn, PA-C  traMADol (ULTRAM) 50 MG tablet Take 1 tablet (50 mg total) by mouth every 6 (six) hours as needed for severe pain. 11/25/15   Jaclyn Shaggy, MD   BP 148/104 mmHg  Pulse 133  Resp 40  SpO2 92% Physical Exam  Constitutional: He is oriented to person, place, and time. He appears well-developed and well-nourished.  HENT:  Head: Normocephalic.  Moist mucous membranes No exudate  Eyes: Pupils are equal, round, and reactive to light.  Pinpoint pupils  Neck: Normal range of motion.  Trachea midline No bruit  Cardiovascular: Normal rate and regular rhythm.   Murmur heard. Pronounced murmur on the right  Pulmonary/Chest: Effort normal. No stridor. He has wheezes.  Wheeze on the right with bronchial sounds on the left.  Abdominal: Soft. He exhibits no mass. Bowel sounds are increased. There is no tenderness. There is no rebound and no guarding.  Musculoskeletal: Normal range of motion. He exhibits no edema.  Compartments are soft  Neurological: He is alert and oriented to person, place, and time. He has normal reflexes.  Skin: Skin is warm and dry.  Psychiatric: He has a normal mood and affect. His behavior is normal.  Nursing note and vitals reviewed.   ED Course  Procedures (including critical care time) DIAGNOSTIC STUDIES: Oxygen Saturation is 92% on 2L, low by my  interpretation.    COORDINATION OF CARE: 3:17 AM Discussed treatment plan which includes lab work, CXR, EKG, and a breathing treatment with pt at bedside and pt agreed to plan.  3:33 AM On reexamination during a breathing treatment, pt now wheezing in all lung fields. Increased from before.    Labs Review Labs Reviewed  CBC WITH DIFFERENTIAL/PLATELET - Abnormal; Notable for the following:    WBC 13.7 (*)    RBC 4.05 (*)    Hemoglobin 11.7 (*)    HCT 34.7 (*)    Neutro Abs 10.2 (*)    Monocytes Absolute 1.2 (*)    All other components within normal limits  I-STAT CHEM 8, ED - Abnormal; Notable for the following:    BUN 21 (*)    Glucose, Bld 327 (*)    All other components within normal limits  COMPREHENSIVE METABOLIC PANEL  LIPASE, BLOOD  BRAIN NATRIURETIC PEPTIDE  URINE RAPID DRUG SCREEN, HOSP PERFORMED  I-STAT TROPOININ, ED  I-STAT CG4 LACTIC ACID, ED    Imaging Review Dg Chest Port 1 View  11/30/2015  CLINICAL DATA:  Shortness of breath. EXAM: PORTABLE CHEST 1 VIEW COMPARISON:  11/21/2015 FINDINGS: Bilateral airspace disease, asymmetric to the left. There is also interstitial coarsening with [employer]. Small pleural effusions. Cardiopericardial enlargement. IMPRESSION: 1. CHF pattern. 2. Asymmetric left-sided airspace disease, asymmetric edema versus pneumonia. Electronically Signed   By: Marnee Spring M.D.   On: 11/30/2015 03:25   I have personally reviewed and evaluated these images and lab results as part of my medical decision-making.   EKG Interpretation None      MDM   Final diagnoses:  Shortness of breath    Results for orders placed or performed during the hospital encounter of 11/30/15  CBC with Differential  Result Value Ref Range   WBC 13.7 (H) 4.0 - 10.5 K/uL   RBC 4.05 (L) 4.22 - 5.81 MIL/uL   Hemoglobin 11.7 (L) 13.0 - 17.0 g/dL   HCT 33.0 (L) 07.6 - 22.6 %   MCV 85.7 78.0 - 100.0 fL  MCH 28.9 26.0 - 34.0 pg   MCHC 33.7 30.0 - 36.0 g/dL    RDW 89.3 73.4 - 28.7 %   Platelets 243 150 - 400 K/uL   Neutrophils Relative % 74 %   Neutro Abs 10.2 (H) 1.7 - 7.7 K/uL   Lymphocytes Relative 15 %   Lymphs Abs 2.1 0.7 - 4.0 K/uL   Monocytes Relative 9 %   Monocytes Absolute 1.2 (H) 0.1 - 1.0 K/uL   Eosinophils Relative 2 %   Eosinophils Absolute 0.3 0.0 - 0.7 K/uL   Basophils Relative 0 %   Basophils Absolute 0.0 0.0 - 0.1 K/uL  Comprehensive metabolic panel  Result Value Ref Range   Sodium 138 135 - 145 mmol/L   Potassium 4.2 3.5 - 5.1 mmol/L   Chloride 107 101 - 111 mmol/L   CO2 22 22 - 32 mmol/L   Glucose, Bld 337 (H) 65 - 99 mg/dL   BUN 22 (H) 6 - 20 mg/dL   Creatinine, Ser 6.81 0.61 - 1.24 mg/dL   Calcium 9.0 8.9 - 15.7 mg/dL   Total Protein 7.3 6.5 - 8.1 g/dL   Albumin 2.9 (L) 3.5 - 5.0 g/dL   AST 43 (H) 15 - 41 U/L   ALT 41 17 - 63 U/L   Alkaline Phosphatase 148 (H) 38 - 126 U/L   Total Bilirubin 0.6 0.3 - 1.2 mg/dL   GFR calc non Af Amer >60 >60 mL/min   GFR calc Af Amer >60 >60 mL/min   Anion gap 9 5 - 15  Lipase, blood  Result Value Ref Range   Lipase 15 11 - 51 U/L  Brain natriuretic peptide  Result Value Ref Range   B Natriuretic Peptide 870.4 (H) 0.0 - 100.0 pg/mL  Urine rapid drug screen (hosp performed)  Result Value Ref Range   Opiates NONE DETECTED NONE DETECTED   Cocaine POSITIVE (A) NONE DETECTED   Benzodiazepines NONE DETECTED NONE DETECTED   Amphetamines NONE DETECTED NONE DETECTED   Tetrahydrocannabinol NONE DETECTED NONE DETECTED   Barbiturates NONE DETECTED NONE DETECTED  Blood gas, venous  Result Value Ref Range   FIO2 1.00    Delivery systems BILEVEL POSITIVE AIRWAY PRESSURE    Mode BILEVEL POSITIVE AIRWAY PRESSURE    pH, Ven 7.384 (H) 7.250 - 7.300   pCO2, Ven 38.4 (L) 45.0 - 50.0 mmHg   pO2, Ven 42.5 30.0 - 45.0 mmHg   Bicarbonate 22.4 20.0 - 24.0 mEq/L   TCO2 20.1 0 - 100 mmol/L   Acid-base deficit 1.8 0.0 - 2.0 mmol/L   O2 Saturation 71.5 %   Patient temperature 98.6     Collection site VEIN    Drawn by Gladstone Lighter, RN    Sample type VEIN   I-Stat Troponin, ED (not at [facility])  Result Value Ref Range   Troponin i, poc 0.18 (HH) 0.00 - 0.08 ng/mL   Comment NOTIFIED PHYSICIAN    Comment 3          I-Stat CG4 Lactic Acid, ED  Result Value Ref Range   Lactic Acid, Venous 1.81 0.5 - 2.0 mmol/L  I-Stat Chem 8, ED  Result Value Ref Range   Sodium 140 135 - 145 mmol/L   Potassium 4.1 3.5 - 5.1 mmol/L   Chloride 105 101 - 111 mmol/L   BUN 21 (H) 6 - 20 mg/dL   Creatinine, Ser 2.62 0.61 - 1.24 mg/dL   Glucose, Bld 035 (H) 65 - 99 mg/dL  Calcium, Ion 1.17 1.12 - 1.23 mmol/L   TCO2 22 0 - 100 mmol/L   Hemoglobin 13.6 13.0 - 17.0 g/dL   HCT 40.9 81.1 - 91.4 %  I-Stat CG4 Lactic Acid, ED  Result Value Ref Range   Lactic Acid, Venous 1.19 0.5 - 2.0 mmol/L   Dg Chest 2 View  11/19/2015  CLINICAL DATA:  Shortness of breath. EXAM: CHEST  2 VIEW COMPARISON:  11/03/2015. FINDINGS: Normal sized heart. Interval mild patchy opacity in the right upper lobe and superior segment of the right lower lobe. Decreased prominence of the interstitial markings. The hila are more prominent, rounded and denser, specially on the right. Mild thoracic spine degenerative changes. IMPRESSION: 1. Interval probable mild pneumonia in the right upper lobe and superior segment of the right lower lobe. 2. Interval possible bilateral hilar adenopathy, greater than on the right. 3. Improving interstitial pulmonary edema. Electronically Signed   By: Beckie Salts M.D.   On: 11/19/2015 07:41   Dg Chest 2 View  11/03/2015  CLINICAL DATA:  Central and RIGHT-sided chest pain or shortness of breath tonight. History of hypertension, diabetes, atrial fibrillation, pneumonia, renal failure, CHF. EXAM: CHEST  2 VIEW COMPARISON:  Chest radiograph October 15, 2015 FINDINGS: Cardiomediastinal silhouette is unremarkable. Diffuse interstitial prominence with patchy airspace opacities, predominant LEFT lung base. No  pleural effusion. No pneumothorax. Soft tissue planes and included osseous structures are nonsuspicious. IMPRESSION: Interstitial prominence, with patchy airspace opacities concerning for bronchopneumonia. Followup PA and lateral chest X-ray is recommended in 3-4 weeks following trial of antibiotic therapy to ensure resolution and exclude underlying malignancy. Electronically Signed   By: Awilda Metro M.D.   On: 11/03/2015 00:35   Ct Head Wo Contrast  11/22/2015  CLINICAL DATA:  Felt a pop in left eye.  Severe headache. EXAM: CT HEAD WITHOUT CONTRAST TECHNIQUE: Contiguous axial images were obtained from the base of the skull through the vertex without intravenous contrast. COMPARISON:  None. FINDINGS: Low-density throughout the deep white matter, likely chronic microvascular disease. No acute intracranial abnormality. Specifically, no hemorrhage, hydrocephalus, mass lesion, acute infarction, or significant intracranial injury. No acute calvarial abnormality. Visualized paranasal sinuses and mastoids clear. Orbital soft tissues unremarkable. IMPRESSION: Low-density throughout the deep white matter, likely chronic small vessel disease. Electronically Signed   By: Charlett Nose M.D.   On: 11/22/2015 10:36   Ct Angio Chest Pe W/cm &/or Wo Cm  11/30/2015  CLINICAL DATA:  Shortness of breath EXAM: CT ANGIOGRAPHY CHEST WITH CONTRAST TECHNIQUE: Multidetector CT imaging of the chest was performed using the standard protocol during bolus administration of intravenous contrast. Multiplanar CT image reconstructions and MIPs were obtained to evaluate the vascular anatomy. CONTRAST:  OMNIPAQUE IOHEXOL 350 MG/ML SOLN COMPARISON:  11/13/2015 FINDINGS: THORACIC INLET/BODY WALL: No acute abnormality. MEDIASTINUM: Cardiomegaly with dilated left ventricle. Atherosclerosis, including the coronary arteries with previous left circulation stenting. No systemic arterial enhancement; no evidence of acute aortic syndrome. CTA  of the pulmonary arteries is limited by respiratory motion, best obtainable given patient's clinical circumstances (on BiPAP). When allowing for distortion by motion, there is no visualized pulmonary embolism. LUNG WINDOWS: There is symmetric bilateral perihilar airspace opacity with diffuse interlobular septal thickening and moderate layering pleural effusions. The pattern is consistent with pulmonary edema. In light of recent diagnosis of pneumonia, there could be superimposed airspace infection. No history of hemoptysis. Clear central airways. UPPER ABDOMEN: No acute findings. OSSEOUS: No acute fracture.  No suspicious lytic or blastic lesions. Review of  the MIP images confirms the above findings. IMPRESSION: 1. CHF pattern, including moderate pleural effusions. 2. Negative for pulmonary embolism. 3. Especially given recent admission for pneumonia, there may be superimposed airway infection. Electronically Signed   By: Marnee Spring M.D.   On: 11/30/2015 06:01   Dg Chest Port 1 View  11/30/2015  CLINICAL DATA:  Shortness of breath. EXAM: PORTABLE CHEST 1 VIEW COMPARISON:  11/21/2015 FINDINGS: Bilateral airspace disease, asymmetric to the left. There is also interstitial coarsening with [employer]. Small pleural effusions. Cardiopericardial enlargement. IMPRESSION: 1. CHF pattern. 2. Asymmetric left-sided airspace disease, asymmetric edema versus pneumonia. Electronically Signed   By: Marnee Spring M.D.   On: 11/30/2015 03:25   Dg Chest Port 1 View  11/21/2015  CLINICAL DATA:  CHF. EXAM: PORTABLE CHEST 1 VIEW COMPARISON:  11/20/2015.  11/19/2015. FINDINGS: Interim extubation removal of NG tube. Mediastinum and hilar structures normal. Heart size stable. No pulmonary venous congestion. No focal infiltrate. No pleural effusion or pneumothorax. IMPRESSION: 1. Interim extubation removal of NG tube. 2. No acute cardiopulmonary disease. Interim clearing of previously identified right lung infiltrate.  Electronically Signed   By: Maisie Fus  Register   On: 11/21/2015 07:30   Dg Chest Port 1 View  11/20/2015  CLINICAL DATA:  Acute respiratory failure. EXAM: PORTABLE CHEST 1 VIEW COMPARISON:  11/19/2015. FINDINGS: Endotracheal tube and NG tube in stable position. Mediastinum and hilar structures are normal. Heart size normal. Interval near complete clearing of right lung infiltrate. No pleural effusion or pneumothorax. IMPRESSION: 1. Endotracheal tube and NG tube in stable position. 2. Interim near complete clearing of right lung infiltrate. Electronically Signed   By: Maisie Fus  Register   On: 11/20/2015 07:09   Dg Chest Port 1 View  11/19/2015  CLINICAL DATA:  Check endotracheal tube position EXAM: PORTABLE CHEST 1 VIEW COMPARISON:  11/19/2015 . FINDINGS: Endotracheal tube has been advanced and now lies 5.2 cm above the carina in satisfactory position. Improved aeration is noted within the lungs bilaterally but particularly on the left. Some residual changes in the right lung are noted. Cardiac shadow is within normal limits. No other focal abnormality is seen. IMPRESSION: Improved endotracheal tube position. Improved aeration on the left with persistent airspace disease in the right lung. Electronically Signed   By: Alcide Clever M.D.   On: 11/19/2015 16:27   Dg Chest Port 1 View  11/19/2015  CLINICAL DATA:  Respiratory distress.  Endotracheal tube placement. EXAM: PORTABLE CHEST 1 VIEW COMPARISON:  Earlier film, same date. FINDINGS: The endotracheal tube is above the thoracic inlet, 13 cm above the carina. This should be advanced at least 8 cm. Persistent bilateral airspace process. No pleural effusion or pneumothorax. IMPRESSION: The endotracheal tube is 13 cm above the carina and should be advanced at least 8 cm. Persistent bilateral airspace process. Electronically Signed   By: Rudie Meyer M.D.   On: 11/19/2015 13:39   Dg Abd Portable 1v  11/19/2015  CLINICAL DATA:  Orogastric tube placement. Congestive  heart failure and atrial fibrillation. Myocardial infarct. EXAM: PORTABLE ABDOMEN - 1 VIEW COMPARISON:  None. FINDINGS: Orogastric tube is seen with tip overlying the distal gastric antrum. No evidence of dilated bowel loops. IMPRESSION: Orogastric tube tip overlies the distal gastric antrum. Electronically Signed   By: Myles Rosenthal M.D.   On: 11/19/2015 18:32   Ct Angio Chest Aorta W/cm &/or Wo/cm  11/03/2015  CLINICAL DATA:  55 year old male with central chest pain. EXAM: CT ANGIOGRAPHY CHEST WITH CONTRAST TECHNIQUE:  Multidetector CT imaging of the chest was performed using the standard protocol during bolus administration of intravenous contrast. Multiplanar CT image reconstructions and MIPs were obtained to evaluate the vascular anatomy. CONTRAST:  OMNIPAQUE IOHEXOL 350 MG/ML SOLN COMPARISON:  Radiograph dated 11/02/2015 FINDINGS: There are small bilateral pleural effusions. There is diffuse interstitial prominence with patchy areas of ground-glass airspace opacity predominantly involving the lower lobes compatible with pneumonia. Clinical correlation and follow-up resolution is recommended. There is no pneumothorax. The central airways are patent. The thoracic aorta appears unremarkable. Evaluation of the pulmonary arteries is limited due to suboptimal opacification of the peripheral branches. No central pulmonary artery embolus identified. There is no cardiomegaly or pericardial effusion. Top-normal bilateral hilar lymph nodes. There is coronary vascular calcification. The visualized esophagus and thyroid gland appear grossly unremarkable. There is no axillary adenopathy. The chest wall soft tissues appear unremarkable. Mild degenerative changes of the spine. No acute fracture. The visualized upper abdomen is unremarkable. Review of the MIP images confirms the above findings. IMPRESSION: No CT evidence of pulmonary embolism or aortic dissection. Small bilateral pleural effusions and diffuse bilateral  ground-glass airspace opacities most compatible with pneumonia. Clinical correlation and follow-up recommended. Electronically Signed   By: Elgie Collard M.D.   On: 11/03/2015 06:49   Ct Orbitss W/o Cm  11/22/2015  CLINICAL DATA:  Felt a pop in left eye. Severe headache. Blurred vision in left eye. Photosensitivity. EXAM: CT ORBITS WITHOUT CONTRAST TECHNIQUE: Multidetector CT imaging of the orbits was performed following the standard protocol without intravenous contrast. COMPARISON:  None. FINDINGS: Orbital soft tissues are unremarkable. Globes are intact. No orbital fracture. Visualized facial bones are intact. Paranasal sinuses are clear. Mastoid air cells are clear. IMPRESSION: No evidence of bony or soft tissue or focal abnormality. Electronically Signed   By: Charlett Nose M.D.   On: 11/22/2015 10:39    Medications  albuterol (PROVENTIL) (2.5 MG/3ML) 0.083% nebulizer solution 5 mg (5 mg Nebulization Given 11/30/15 0335)  furosemide (LASIX) injection 40 mg (40 mg Intravenous Given 11/30/15 0423)  iohexol (OMNIPAQUE) 350 MG/ML injection 100 mL (100 mLs Intravenous Contrast Given 11/30/15 0431)  LORazepam (ATIVAN) injection 0.5 mg (0.5 mg Intravenous Given 11/30/15 0532)  furosemide (LASIX) injection 40 mg (40 mg Intravenous Given 11/30/15 0532)  vancomycin (VANCOCIN) IVPB 1000 mg/200 mL premix (0 mg Intravenous Stopped 11/30/15 0706)  piperacillin-tazobactam (ZOSYN) IVPB 3.375 g (0 g Intravenous Stopped 11/30/15 0609)    BIPAP initiated in the ED  MDM Number of Diagnoses or Management Options Cocaine abuse:  Systolic congestive heart failure, unspecified congestive heart failure chronicity [facility]):  Critical Care Total time providing critical care: 75-105 minutes MDM Reviewed: previous chart, nursing note and vitals Reviewed previous: labs, ECG and x-ray Interpretation: labs, ECG, x-ray and CT scan (positive troponin elevated BNP elevated BUN,  CHF by me on cxr and pleural effusions without PE on CT  by me) Total time providing critical care: 75-105 minutes. This excludes time spent performing separately reportable procedures and services. Consults: critical care  CRITICAL CARE Performed by: Jasmine Awe Total critical care time: 91 minutes Critical care time was exclusive of separately billable procedures and treating other patients. Critical care was necessary to treat or prevent imminent or life-threatening deterioration. Critical care was time spent personally by me on the following activities: development of treatment plan with patient and/or surrogate as well as nursing, discussions with consultants, evaluation of patient's response to treatment, examination of patient, obtaining history from patient or surrogate, ordering  and performing treatments and interventions, ordering and review of laboratory studies, ordering and review of radiographic studies, pulse oximetry and re-evaluation of patient's condition.   I personally performed the services described in this documentation, which was scribed in my presence. The recorded information has been reviewed and is accurate.      Cy Blamer, MD 11/30/15 [phone]

## 2015-11-30 NOTE — ED Notes (Signed)
Pt. Came in with complaint of SOB , denies chest pain , alert and oriented x3, pt. Stated that he was intubated last week when he was admitted . Per Pt. He Has hx. Of heart attack. Pt. Stated that he gave himself inhaler at 11pm last night but no relief.

## 2015-11-30 NOTE — ED Notes (Signed)
He has just been placed on O2 per cannula per advisement of PCCM physician, who has just examined pt.  He is in no distress and is minimally short of breath.  He stands in his room to void without difficulty.

## 2015-11-30 NOTE — ED Notes (Signed)
While administering breathing treatment, pt was noted to go into an atrial rhythm at 135-140, lasting for several minutes. Pt then converted to NSR at 95. EDP made aware of the same.

## 2015-11-30 NOTE — Progress Notes (Signed)
Unable to obtain arterial sample for testing. EDP aware. Order received for VBG.

## 2015-11-30 NOTE — H&P (Signed)
PULMONARY / CRITICAL CARE MEDICINE   Name: Sean Hinton MRN: [id number] DOB: 11-Nov-1960    ADMISSION DATE:  11/30/2015 CONSULTATION DATE:  11/30/2015  REFERRING MD:  ER physician  CHIEF COMPLAINT:  Shortness of breath  HISTORY OF PRESENT ILLNESS:   This is a 55 year old male with a past medical history significant for paroxysmal A. fib, systolic heart failure, severe mitral regurgitation, and cocaine use who presented to the [facility] emergency department on 11/30/2015 with acute hypoxemic respiratory failure. He was just discharged from [facility] on 11/23/2015 with a discharge diagnosis of congestive heart failure exacerbation believed to be secondary to cocaine use. He was also noted to have aspiration pneumonia during that time. The patient states that he was in his usual state of health until approximately 2-3 days ago when he began feeling more short of breath, producing green mucus, and feeling generally fatigued. He has been under more stress in the last couple days because his girlfriend was admitted to the hospital with acute pancreatitis. He says that he woke up in the middle the night last night feeling dramatically more short of breath so he drove himself to the emergency department. In the emergency department he was found to have bilateral pulmonary edema on a chest x-ray and was given Lasix as well as placed on BiPAP. He also underwent a CT angiogram of his chest.  PAST MEDICAL HISTORY :  He  has a past medical history of Diabetes mellitus with complication (HCC); Hypertension; Syncope; Atrial fibrillation and flutter (HCC); Ischemic cardiomyopathy; CAD (coronary artery disease); Hypertensive heart disease; Hyperlipidemia; Mitral regurgitation; Tobacco abuse; Chronic systolic CHF (congestive heart failure) (HCC); ARF (acute respiratory failure) (HCC); Pneumonia (09/2015); GERD (gastroesophageal reflux disease); Headache; Arthritis; and NSTEMI (non-ST elevated myocardial  infarction) (HCC) (10/2015).  PAST SURGICAL HISTORY: He  has past surgical history that includes right thumb surgery ; Cardiac catheterization (N/A, 02/24/2015); Cardiac catheterization (02/24/2015); Cardiac catheterization (N/A, 11/03/2015); and Cardiac catheterization (N/A, 11/03/2015).  No Known Allergies  No current facility-administered medications on file prior to encounter.   Current Outpatient Prescriptions on File Prior to Encounter  Medication Sig  . albuterol (PROVENTIL HFA;VENTOLIN HFA) 108 (90 BASE) MCG/ACT inhaler Inhale 2 puffs into the lungs every 6 (six) hours as needed for wheezing or shortness of breath.  Melene Muller ON 12/01/2015] amiodarone (PACERONE) 200 MG tablet Take 1 tablet (200 mg total) by mouth daily.  Marland Kitchen aspirin 81 MG chewable tablet Chew 1 tablet (81 mg total) by mouth daily.  Marland Kitchen atorvastatin (LIPITOR) 80 MG tablet Take 1 tablet (80 mg total) by mouth every evening.  . clopidogrel (PLAVIX) 75 MG tablet Take 1 tablet (75 mg total) by mouth daily with breakfast.  . Dexlansoprazole (DEXILANT) 30 MG capsule Take 1 capsule (30 mg total) by mouth daily.  . furosemide (LASIX) 40 MG tablet Take 1 tablet (40 mg total) by mouth daily.  Marland Kitchen glipiZIDE (GLUCOTROL) 10 MG tablet Take 1 tablet (10 mg total) by mouth 2 (two) times daily before a meal. (Patient taking differently: Take 5 mg by mouth daily before breakfast. )  . hydrALAZINE (APRESOLINE) 25 MG tablet Take 1 tablet (25 mg total) by mouth 3 (three) times daily.  Marland Kitchen ipratropium-albuterol (DUONEB) 0.5-2.5 (3) MG/3ML SOLN Take 3 mLs by nebulization 2 (two) times daily.  . isosorbide mononitrate (IMDUR) 30 MG 24 hr tablet Take 1 tablet (30 mg total) by mouth daily.  . metoprolol tartrate (LOPRESSOR) 25 MG tablet Take 1 tablet (25 mg total) by  mouth 2 (two) times daily.  . nitroGLYCERIN (NITROSTAT) 0.4 MG SL tablet Place 1 tablet (0.4 mg total) under the tongue every 5 (five) minutes as needed for chest pain (up to 3 doses).  . potassium  chloride SA (K-DUR,KLOR-CON) 20 MEQ tablet Take 1 tablet (20 mEq total) by mouth daily.  . rivaroxaban (XARELTO) 20 MG TABS tablet Take 1 tablet (20 mg total) by mouth daily with supper. (Patient taking differently: Take 20 mg by mouth daily. )  . traMADol (ULTRAM) 50 MG tablet Take 1 tablet (50 mg total) by mouth every 6 (six) hours as needed for severe pain.  Marland Kitchen amiodarone (PACERONE) 400 MG tablet Take 1 tablet (400 mg total) by mouth daily. (Patient not taking: Reported on 11/30/2015)    FAMILY HISTORY:  His indicated that his mother is alive. He indicated that his father is deceased.   SOCIAL HISTORY: He  reports that he has been smoking Cigarettes.  He has a 7.5 pack-year smoking history. He has never used smokeless tobacco. He reports that he uses illicit drugs (Cocaine). He reports that he does not drink alcohol.  REVIEW OF SYSTEMS:   Gen: Denies fever, chills, weight change, + fatigue, night sweats HEENT: Denies blurred vision, double vision, hearing loss, tinnitus, sinus congestion, rhinorrhea, sore throat, neck stiffness, dysphagia PULM: per HPI CV: Denies chest pain, edema, orthopnea, paroxysmal nocturnal dyspnea, palpitations GI: Denies abdominal pain, nausea, vomiting, diarrhea, hematochezia, melena, constipation, change in bowel habits GU: Denies dysuria, hematuria, polyuria, oliguria, urethral discharge Endocrine: Denies hot or cold intolerance, polyuria, polyphagia or appetite change Derm: Denies rash, dry skin, scaling or peeling skin change Heme: Denies easy bruising, bleeding, bleeding gums Neuro: Denies headache, numbness, weakness, slurred speech, loss of memory or consciousness   SUBJECTIVE:  As above  VITAL SIGNS: BP 114/94 mmHg  Pulse 78  Resp 19  SpO2 100%  HEMODYNAMICS:    VENTILATOR SETTINGS: Vent Mode:  [-] BIPAP;Spontaneous;PSV FiO2 (%):  [80 %-100 %] 80 % PEEP:  [6 cmH20] 6 cmH20 Pressure Support:  [12 cmH20] 12 cmH20  INTAKE / OUTPUT: I/O last 3  completed shifts: In: -  Out: 1300 [Urine:1300]  PHYSICAL EXAMINATION: General:  Thin male, no distress on BiPAP Neuro:  Awake alert, oriented 4, moves all 4 extremities HEENT:  Normocephalic atraumatic, oropharynx clear, extraocular movements intact Cardiovascular:  Regular rate and rhythm, systolic murmur noted, minimal JVD Lungs:  Surprisingly clear to auscultation, diminished in bases, normal respiratory effort Abdomen:  Bowel sounds positive, nontender nondistended Musculoskeletal:  Normal bulk and tone Skin:  No rash or skin breakdown  LABS:  BMET  Recent Labs Lab 11/30/15 0335 11/30/15 0349  NA 138 140  K 4.2 4.1  CL 107 105  CO2 22  --   BUN 22* 21*  CREATININE 1.22 1.00  GLUCOSE 337* 327*    Electrolytes  Recent Labs Lab 11/30/15 0335  CALCIUM 9.0    CBC  Recent Labs Lab 11/30/15 0335 11/30/15 0349  WBC 13.7*  --   HGB 11.7* 13.6  HCT 34.7* 40.0  PLT 243  --     Coag's No results for input(s): APTT, INR in the last 168 hours.  Sepsis Markers  Recent Labs Lab 11/30/15 0348 11/30/15 0553  LATICACIDVEN 1.81 1.19    ABG No results for input(s): PHART, PCO2ART, PO2ART in the last 168 hours.  Liver Enzymes  Recent Labs Lab 11/30/15 0335  AST 43*  ALT 41  ALKPHOS 148*  BILITOT 0.6  ALBUMIN 2.9*  Cardiac Enzymes No results for input(s): TROPONINI, PROBNP in the last 168 hours.  Glucose  Recent Labs Lab 11/23/15 1121  GLUCAP 251*    Imaging Ct Angio Chest Pe W/cm &/or Wo Cm  11/30/2015  CLINICAL DATA:  Shortness of breath EXAM: CT ANGIOGRAPHY CHEST WITH CONTRAST TECHNIQUE: Multidetector CT imaging of the chest was performed using the standard protocol during bolus administration of intravenous contrast. Multiplanar CT image reconstructions and MIPs were obtained to evaluate the vascular anatomy. CONTRAST:  OMNIPAQUE IOHEXOL 350 MG/ML SOLN COMPARISON:  11/13/2015 FINDINGS: THORACIC INLET/BODY WALL: No acute abnormality.  MEDIASTINUM: Cardiomegaly with dilated left ventricle. Atherosclerosis, including the coronary arteries with previous left circulation stenting. No systemic arterial enhancement; no evidence of acute aortic syndrome. CTA of the pulmonary arteries is limited by respiratory motion, best obtainable given patient's clinical circumstances (on BiPAP). When allowing for distortion by motion, there is no visualized pulmonary embolism. LUNG WINDOWS: There is symmetric bilateral perihilar airspace opacity with diffuse interlobular septal thickening and moderate layering pleural effusions. The pattern is consistent with pulmonary edema. In light of recent diagnosis of pneumonia, there could be superimposed airspace infection. No history of hemoptysis. Clear central airways. UPPER ABDOMEN: No acute findings. OSSEOUS: No acute fracture.  No suspicious lytic or blastic lesions. Review of the MIP images confirms the above findings. IMPRESSION: 1. CHF pattern, including moderate pleural effusions. 2. Negative for pulmonary embolism. 3. Especially given recent admission for pneumonia, there may be superimposed airway infection. Electronically Signed   By: Marnee Spring M.D.   On: 11/30/2015 06:01   Dg Chest Port 1 View  11/30/2015  CLINICAL DATA:  Shortness of breath. EXAM: PORTABLE CHEST 1 VIEW COMPARISON:  11/21/2015 FINDINGS: Bilateral airspace disease, asymmetric to the left. There is also interstitial coarsening with [employer]. Small pleural effusions. Cardiopericardial enlargement. IMPRESSION: 1. CHF pattern. 2. Asymmetric left-sided airspace disease, asymmetric edema versus pneumonia. Electronically Signed   By: Marnee Spring M.D.   On: 11/30/2015 03:25     STUDIES:  11/30/2015 CT angiogram chest: Congestive heart failure pattern seen, no PE  CULTURES: 11/30/2015 blood culture  ANTIBIOTICS: 11/30/2015 vancomycin 1 dose 11/30/2015 Zosyn 1 dose 11/30/2015 Levaquin oral 750 mg daily scheduled for 5  days  SIGNIFICANT EVENTS: Feb fifth 2017 admitted to the hospital  LINES/TUBES: None  DISCUSSION: This is a 55 year old male who presented to the [facility] emergency department on 11/30/2015 with shortness of breath. He was found to be in acute hypoxemic respiratory failure which is most likely secondary to flash pulmonary edema based on his physical exam, chest x-ray, and good response to diuretics. The differential diagnosis includes pneumonia but I think this is less likely considering the dramatic response to diuretic therapy. I believe that he has ongoing cocaine use based on his urine drug screen but he adamantly denies this. He also notes that he has been taking his medications as prescribed.  ASSESSMENT / PLAN:  PULMONARY A: Acute hypoxemic respiratory failure secondary to acute pulmonary edema secondary to acute congestive heart failure exacerbation (systolic) secondary to ongoing cocaine use Recent aspiration pneumonia Likely bronchitis Tobacco use P:   Use BiPAP only as needed for now Admit to stepdown for close observation Use oxygen to maintain O2 saturation greater than 90% Diuresis with Lasix IV Counsel to quit smoking  CARDIOVASCULAR A:  Acute decompensated systolic heart failure Paroxysmal A. fib Hypertension Likely ongoing cocaine use Demand ischemia > 2/5 ECG reviewed, no evidence of acute ischemia P:  Telemetry monitoring  Resume all home medications including Plavix, Xarelto Continue amiodarone as prescribed at hospital discharge, as I understand it he is to start taking 200 mg daily tomorrow Use IV Lasix for now, likely resume oral tomorrow Sodium restricted diet, fluid restricted diet Counseled to stay away from cocaine   RENAL A:   No acute issues P:   Check basic metabolic panel later today to ensure no hypokalemia Give oral potassium today in setting of aggressive IV Lasix use Monitor BMET and UOP Replace electrolytes as  needed   GASTROINTESTINAL A:   GERD P:   Continue home pantoprazole Cardiac prudent diet  HEMATOLOGIC A:   On oral anticoagulation secondary to atrial fibrillation P:  Continue Xarelto Monitor for bleeding  INFECTIOUS A:   Recent aspiration pneumonia Now with likely bronchitis P:   Continue levaquin 5 days Monitor for fever  ENDOCRINE A:   Hyperlipidemia   P:   Continue Statin  NEUROLOGIC A:   No acute issues P:      FAMILY  - Updates: None bedside  - Inter-disciplinary family meet or Palliative Care meeting due by: day 7   Heber [place], MD [place] PCCM Pager: [phone] Cell: [phone] After 3pm or if no response, call [phone]   11/30/2015, 8:06 AM

## 2015-11-30 NOTE — ED Notes (Signed)
Patient returned from radiology. Unable to complete radiology study d/t pt unable to lie flat during the study, on return to room, pt in resp distress-diaphoretic, increased resp rate, saturations dropped to 83% on 4 liters, pt in tripoding position. EDP notified of the same- respiratory notified to start pt on bipap. Pt fiance updated on pt status per pt.

## 2015-11-30 NOTE — Progress Notes (Signed)
Utilization review completed.  

## 2015-11-30 NOTE — ED Notes (Signed)
edp and nurse notified abt results from istat troponin

## 2015-11-30 NOTE — Progress Notes (Signed)
Called to room for BiPAP. Upon arrival, patient in tripod position with tachypnea. Initial settings with NIV PC but patient did not tolerate. Complains of feeling "forced to breath too much". Changed to PS. Tolerates better. PS 12 above PEEP 6. Any variation of change made that increases the PEEP or decreases the PS has the patient attempting to get OOB and complains "can't breath". Tolerating 18/6 well with volumes around 500 mL. Patient is relaxed in the bed and states he feels "alot better" but would like to eat/dring. Education provided. RT will continue to follow.

## 2015-11-30 NOTE — Progress Notes (Signed)
PT placed on [place] by MD per RN. PT does not appear to be in respiratory distress at this time.

## 2015-11-30 NOTE — ED Notes (Signed)
Patient transported to CT to re attempt ct angio.

## 2015-12-01 ENCOUNTER — Telehealth: Payer: Self-pay

## 2015-12-01 DIAGNOSIS — I5043 Acute on chronic combined systolic (congestive) and diastolic (congestive) heart failure: Principal | ICD-10-CM

## 2015-12-01 DIAGNOSIS — I25118 Atherosclerotic heart disease of native coronary artery with other forms of angina pectoris: Secondary | ICD-10-CM

## 2015-12-01 DIAGNOSIS — E1159 Type 2 diabetes mellitus with other circulatory complications: Secondary | ICD-10-CM

## 2015-12-01 LAB — BASIC METABOLIC PANEL
ANION GAP: 11 (ref 5–15)
BUN: 24 mg/dL — ABNORMAL HIGH (ref 6–20)
CO2: 26 mmol/L (ref 22–32)
Calcium: 9 mg/dL (ref 8.9–10.3)
Chloride: 105 mmol/L (ref 101–111)
Creatinine, Ser: 1.24 mg/dL (ref 0.61–1.24)
Glucose, Bld: 164 mg/dL — ABNORMAL HIGH (ref 65–99)
Potassium: 3.5 mmol/L (ref 3.5–5.1)
SODIUM: 142 mmol/L (ref 135–145)

## 2015-12-01 LAB — CBC
HCT: 38.5 % — ABNORMAL LOW (ref 39.0–52.0)
Hemoglobin: 12.7 g/dL — ABNORMAL LOW (ref 13.0–17.0)
MCH: 28.7 pg (ref 26.0–34.0)
MCHC: 33 g/dL (ref 30.0–36.0)
MCV: 86.9 fL (ref 78.0–100.0)
PLATELETS: 272 10*3/uL (ref 150–400)
RBC: 4.43 MIL/uL (ref 4.22–5.81)
RDW: 14.6 % (ref 11.5–15.5)
WBC: 10.3 10*3/uL (ref 4.0–10.5)

## 2015-12-01 LAB — MAGNESIUM: MAGNESIUM: 2 mg/dL (ref 1.7–2.4)

## 2015-12-01 LAB — GLUCOSE, CAPILLARY
GLUCOSE-CAPILLARY: 253 mg/dL — AB (ref 65–99)
GLUCOSE-CAPILLARY: 220 mg/dL — AB (ref 65–99)
GLUCOSE-CAPILLARY: 194 mg/dL — AB (ref 65–99)
Glucose-Capillary: 82 mg/dL (ref 65–99)

## 2015-12-01 MED ORDER — HYDRALAZINE HCL 25 MG PO TABS
25.0000 mg | ORAL_TABLET | Freq: Once | ORAL | Status: AC
  Administered 2015-12-01: 25 mg via ORAL
  Filled 2015-12-01: qty 1

## 2015-12-01 MED ORDER — HYDRALAZINE HCL 20 MG/ML IJ SOLN: 10.0000 mg | INTRAMUSCULAR | Status: DC | PRN

## 2015-12-01 MED ORDER — AMLODIPINE BESYLATE 5 MG PO TABS
5.0000 mg | ORAL_TABLET | Freq: Every day | ORAL | Status: DC
  Administered 2015-12-01: 5 mg via ORAL
  Filled 2015-12-01: qty 1

## 2015-12-01 MED ORDER — HYDRALAZINE HCL 25 MG PO TABS: 50.0000 mg | ORAL_TABLET | Freq: Three times a day (TID) | ORAL | Status: DC

## 2015-12-01 MED ORDER — ACETAMINOPHEN 325 MG PO TABS
650.0000 mg | ORAL_TABLET | Freq: Four times a day (QID) | ORAL | Status: DC | PRN
  Administered 2015-12-01: 650 mg via ORAL
  Filled 2015-12-01: qty 2

## 2015-12-01 MED ORDER — AMLODIPINE BESYLATE 5 MG PO TABS: 5.0000 mg | ORAL_TABLET | Freq: Every day | ORAL | Status: DC

## 2015-12-01 MED ORDER — FUROSEMIDE 10 MG/ML IJ SOLN
20.0000 mg | Freq: Once | INTRAMUSCULAR | Status: AC
  Administered 2015-12-01: 20 mg via INTRAVENOUS
  Filled 2015-12-01: qty 2

## 2015-12-01 MED ORDER — HYDRALAZINE HCL 50 MG PO TABS
50.0000 mg | ORAL_TABLET | Freq: Three times a day (TID) | ORAL | Status: DC
  Administered 2015-12-01: 50 mg via ORAL
  Filled 2015-12-01: qty 1

## 2015-12-01 MED FILL — hydrALAZINE HCL 25 MG TABS: 25 | 15 days supply | Qty: 90 | Fill #0

## 2015-12-01 MED FILL — ?AMLODIPINE BESYLATE 5 MG T: 5 | 30 days supply | Qty: 30 | Fill #0

## 2015-12-01 NOTE — Telephone Encounter (Signed)
Call to Marcelle Smiling, RN CM to inform her that the patient has been followed at the [facility].  His appointment for tomorrow has been cancelled and will be rescheduled when a discharge date is determined.  Will follow his hospital course.

## 2015-12-01 NOTE — Progress Notes (Addendum)
Inpatient Diabetes Program Recommendations  AACE/ADA: New Consensus Statement on Inpatient Glycemic Control (2015)  Target Ranges:  Prepandial:   less than 140 mg/dL      Peak postprandial:   less than 180 mg/dL (1-2 hours)      Critically ill patients:  140 - 180 mg/dL   Results for Sean Hinton, Sean Hinton (MRN [id number]) as of 12/01/2015 08:14  Ref. Range 11/30/2015 12:37 11/30/2015 21:32 12/01/2015 07:40  Glucose-Capillary Latest Ref Range: 65-99 mg/dL 284 (H) 82 132 (H)   Results for Sean Hinton, Sean Hinton (MRN [id number]) as of 12/01/2015 08:14  Ref. Range 11/19/2015 12:03  Hemoglobin A1C Latest Ref Range: 4.8-5.6 % 9.9 (H)    Admit with: SOB  History: DM, CHF, Cocaine Abuse  Home DM Meds: Glipizide 5 mg daily  Current Insulin Orders: Novolog Moderate SSI (0-15 units) TID AC + HS      Glipizide 10 mg bid      -Note patient recently discharged from hospital on 11/23/15.  Was seen at the [facility] for follow-up care on 11/25/15.  At that visit, Dr. Venetia Night instructed patient to increase his Glipizide dose from 5mg  daily to 10 mg daily b/c pt's A1c was elevated >9%.  Not sure if patient followed these instructions.  -Also note during last hospitalization that patient required Lantus insulin to help keep glucose levels under control.  -Note Toxicology screen positive for Cocaine this admission as well.     MD- Please consider starting Lantus insulin for this patient-  Lantus 14 units daily (0.2 units/kg dosing)     --Will follow patient during hospitalization--  RN, MSN, CDE Diabetes Coordinator Inpatient Glycemic Control Team Team Pager: [phone] (8a-5p)

## 2015-12-02 ENCOUNTER — Telehealth: Payer: Self-pay

## 2015-12-02 ENCOUNTER — Ambulatory Visit: Payer: Self-pay | Admitting: Family Medicine

## 2015-12-02 NOTE — Telephone Encounter (Signed)
.    This was opened in error.

## 2015-12-02 NOTE — Telephone Encounter (Signed)
Message received from Julianne Handler, Congregational Nurse at [facility] # [phone] requesting a call back. She had spoken to Lance Bosch, Charity fundraiser.  Call returned to  Ms [facility] and she stated that she received a call from the patient requesting help with paying for his medications. He was asking for $42. She said that he told her he got her name from his pastor. She also noted that he was not completely  clear about which medications he needed to have filled.  This CM informed her that he has been filling his prescriptions at the [facility] and the pharmacy will assist him with obtaining his medications despite his limited finances. Explained to her that he needs to come to the clinic to meet with the doctor to reconcile his medications. He was just discharged from the hospital yesterday and keeping a hospital follow up appointment is very important.   Also informed her that this CM left a message for him this morning and he has not returned the call. He had an appointment scheduled for today at 1400 at [facility] which he made earlier this morning;  but he cancelled that appointment this afternoon. She noted that she exchanged multiple calls with the patient to try to determine how she could assist him; but was not sure exactly how to help him at this time. Instructed her to remind him of the importance of following up with the provider at [facility]. He can also contact this CM if he has any questions.

## 2015-12-02 NOTE — Telephone Encounter (Signed)
Attempted to contact the patient to discuss scheduling a follow up appointment as he was discharged yesterday. Call placed to # [phone] (H) and his sister, Alona Bene, answered and stated that he was not there.  She said that she would have him call this CM back at # [phone]. CM stated that it was important that he return the call. . She said that the #  [phone] (M) is not working.

## 2015-12-03 ENCOUNTER — Telehealth: Payer: Self-pay

## 2015-12-03 NOTE — Telephone Encounter (Signed)
[facility] Post-discharge Follow-Up Phone Call:  Attempt # 1.  The patient was scheduled for an appointment with Dr Venetia Night yesterday and cancelled the appointment.  Date of Discharge: 12/01/2015 Principal Discharge Diagnosis(es): acute on chronic CHF, HTN, CAD, cocaine use Post-discharge Communication: (Clearly document all attempts clearly and date contact made) Call placed to # [phone] and his finance, [name], answered and stated that he was not home and to call # [phone] and he has that phone with him.  A call was then placed to that number and a HIPAA compliant voice mail message was left requesting a call back to # [phone] or [phone].  Call Completed: No

## 2015-12-03 NOTE — Discharge Summary (Signed)
[facility] Discharge Summary   Patient: Sean Hinton TXH:[id number]   PCP: Lora Paula, MD DOB: 1961/09/28   Date of admission: 11/30/2015   Date of discharge: 12/01/2015    Discharge Diagnoses:  Principal Problem:   Acute on chronic combined systolic and diastolic congestive heart failure (HCC) Active Problems:   Essential hypertension   CAD (coronary artery disease)   Type 2 diabetes mellitus with vascular disease (HCC)   Cocaine abuse  Recommendations for Outpatient Follow-up:  1. Please compliant with your medication and follow-up with PCP in one week   Follow-up Information    Follow up with Jaclyn Shaggy, MD. Go on 12/02/2015.   Specialty:  Family Medicine   Contact information:   [address] [place] [address] [phone]       Follow up with [name], MD. Go on 12/04/2015.   Specialty:  Cardiology   Contact information:   [address] [place] [address] [phone]       Diet recommendation: cardiac diet  Activity: The patient is advised to gradually reintroduce usual activities.  Discharge Condition: good  History of present illness: As per the H and P dictated on admission, "This is a 55 year old male with a past medical history significant for paroxysmal A. fib, systolic heart failure, severe mitral regurgitation, and cocaine use who presented to the [facility] emergency department on 11/30/2015 with acute hypoxemic respiratory failure. He was just discharged from [facility] on 11/23/2015 with a discharge diagnosis of congestive heart failure exacerbation believed to be secondary to cocaine use. He was also noted to have aspiration pneumonia during that time. The patient states that he was in his usual state of health until approximately 2-3 days ago when he began feeling more short of breath, producing green mucus, and feeling generally fatigued. He has been under more stress in the last couple days because  his girlfriend was admitted to the hospital with acute pancreatitis. He says that he woke up in the middle the night last night feeling dramatically more short of breath so he drove himself to the emergency department. In the emergency department he was found to have bilateral pulmonary edema on a chest x-ray and was given Lasix as well as placed on BiPAP. He also underwent a CT angiogram of his chest."  Hospital Course:  Summary of his active problems in the hospital is as following.  Principal Problem:   Acute on chronic combined systolic and diastolic congestive heart failure  (HCC) The patient presented with complaints of shortness of breath as well as orthopnea and PND. He should in the ER the patient was requiring bipap with 80% oxygen. Interim he was transitioned to 2 L of nasal cannula and currently on room air. She does not appear to be in acute distress. Patient was given IV antibiotics although the suspicion for ongoing acute infection is significantly less likely given rapid improvement. Patient was given 40 mg IV Lasix every 6 hours. His presentation most likely secondary to recurrent cocaine use with accelerated hypertension. Patient was remained compliant with his home medications, Lopressor was discontinued due to patient's recurrent cocaine abuse an instant the patient was placed on Norvasc. His hydralazine dose was increased Patient was requested to remain compliant with his follow-up appointment which is scheduled tomorrow.    Essential hypertension Lopressor discontinued due to his recurrent cocaine abuse Instant the patient has been placed on Norvasc, hydralazine dose has been increased and the patient has been requested  to remain compliant with her medical regimen and stay away from cocaine abuse as he would be at high risk for developing strokes, angina, heart attack as well as death. Patient verbalized understanding.    CAD (coronary artery disease) EKG at this time  does not show any evidence of acute ischemia. Patient did not have any complains of angina. Recommended to remain compliant with medical regimen.    Type 2 diabetes mellitus with vascular disease (HCC) Continue home regimen.    Cocaine abuse Request patient to refrain from further abuse as it can aggravate his recurrent heart failure as well as worsening hypertension and put him at high risk for coronary and other cardiovascular events.   All other chronic medical condition were stable during the hospitalization.  Patient was ambulatory without any assistance. On the day of the discharge the patient's vitals remained stable, and no other acute medical condition were reported by patient. the patient was felt safe to be discharge at home with family.  Procedures and Results:  none   Consultations:  PCCM primary admitting team  DISCHARGE MEDICATION: Discharge Medication List as of 12/01/2015  2:33 PM    START taking these medications   Details  amLODipine (NORVASC) 5 MG tablet Take 1 tablet (5 mg total) by mouth daily., Starting 12/01/2015, Until Discontinued, Normal      CONTINUE these medications which have CHANGED   Details  hydrALAZINE (APRESOLINE) 25 MG tablet Take 2 tablets (50 mg total) by mouth 3 (three) times daily., Starting 12/01/2015, Until Discontinued, Normal      CONTINUE these medications which have NOT CHANGED   Details  albuterol (PROVENTIL HFA;VENTOLIN HFA) 108 (90 BASE) MCG/ACT inhaler Inhale 2 puffs into the lungs every 6 (six) hours as needed for wheezing or shortness of breath., Starting 10/17/2015, Until Discontinued, Print    amiodarone (PACERONE) 200 MG tablet Take 1 tablet (200 mg total) by mouth daily., Starting 12/01/2015, Until Discontinued, Normal    aspirin 81 MG chewable tablet Chew 1 tablet (81 mg total) by mouth daily., Starting 11/06/2015, Until Discontinued, No Print    atorvastatin (LIPITOR) 80 MG tablet Take 1 tablet (80 mg total) by mouth every  evening., Starting 10/17/2015, Until Discontinued, Print    clopidogrel (PLAVIX) 75 MG tablet Take 1 tablet (75 mg total) by mouth daily with breakfast., Starting 11/06/2015, Until Discontinued, Normal    Dexlansoprazole (DEXILANT) 30 MG capsule Take 1 capsule (30 mg total) by mouth daily., Starting 12/30/2014, Until Discontinued, Print    furosemide (LASIX) 40 MG tablet Take 1 tablet (40 mg total) by mouth daily., Starting 02/25/2015, Until Discontinued, Normal    glipiZIDE (GLUCOTROL) 10 MG tablet Take 1 tablet (10 mg total) by mouth 2 (two) times daily before a meal., Starting 11/06/2015, Until Discontinued, Normal    ipratropium-albuterol (DUONEB) 0.5-2.5 (3) MG/3ML SOLN Take 3 mLs by nebulization 2 (two) times daily., Starting 10/17/2015, Until Discontinued, Print    isosorbide mononitrate (IMDUR) 30 MG 24 hr tablet Take 1 tablet (30 mg total) by mouth daily., Starting 11/11/2015, Until Discontinued, Normal    nitroGLYCERIN (NITROSTAT) 0.4 MG SL tablet Place 1 tablet (0.4 mg total) under the tongue every 5 (five) minutes as needed for chest pain (up to 3 doses)., Starting 02/25/2015, Until Discontinued, Normal    potassium chloride SA (K-DUR,KLOR-CON) 20 MEQ tablet Take 1 tablet (20 mEq total) by mouth daily., Starting 10/17/2015, Until Discontinued, Print    rivaroxaban (XARELTO) 20 MG TABS tablet Take 1 tablet (20 mg total)  by mouth daily with supper., Starting 02/25/2015, Until Discontinued, Normal    traMADol (ULTRAM) 50 MG tablet Take 1 tablet (50 mg total) by mouth every 6 (six) hours as needed for severe pain., Starting 11/25/2015, Until Discontinued, Print      STOP taking these medications     metoprolol tartrate (LOPRESSOR) 25 MG tablet        No Known Allergies Discharge Instructions    Diet - low sodium heart healthy    Complete by:  As directed      Diet Carb Modified    Complete by:  As directed      Discharge instructions    Complete by:  As directed   It is important that  you read following instructions as well as go over your medication list with RN to help you understand your care after this hospitalization.  Discharge Instructions: Please follow-up with PCP in one week  Please request your primary care physician to go over all Hospital Tests and Procedure/Radiological results at the follow up,  Please get all Hospital records sent to your PCP by signing hospital release before you go home.   You were cared for by a hospitalist during your hospital stay. If you have any questions about your discharge medications or the care you received while you were in the hospital after you are discharged, you can call the unit and ask to speak with the hospitalist on call if the hospitalist that took care of you is not available.  Once you are discharged, your primary care physician will handle any further medical issues. Please note that NO REFILLS for any discharge medications will be authorized once you are discharged, as it is imperative that you return to your primary care physician (or establish a relationship with a primary care physician if you do not have one) for your aftercare needs so that they can reassess your need for medications and monitor your lab values. You Must read complete instructions/literature along with all the possible adverse reactions/side effects for all the Medicines you take and that have been prescribed to you. Take any new Medicines after you have completely understood and accept all the possible adverse reactions/side effects. Wear Seat belts while driving. If you have smoked or chewed Tobacco in the last 2 yrs please stop smoking and/or stop any Recreational drug use.     Heart Failure patients record your daily weight using the same scale at the same time of day    Complete by:  As directed      Increase activity slowly    Complete by:  As directed           Discharge Exam: Filed Weights   11/30/15 0807 12/01/15 1012  Weight: 72.3  kg (159 lb 6.3 oz) 71.4 kg (157 lb 6.5 oz)   Filed Vitals:   12/01/15 1200 12/01/15 1300  BP: 151/98 133/89  Pulse: 78 73  Temp: 97.8 F (36.6 C)   Resp: 22 24   General: Appear in no distress, no Rash; Oral Mucosa moist. Cardiovascular: S1 and S2 Present, no Murmur, no JVD Respiratory: Bilateral Air entry present and Clear to Auscultation, no Crackles, no wheezes Abdomen: Bowel Sound present, Soft and no tenderness Extremities: no Pedal edema, no calf tenderness Neurology: Grossly no focal neuro deficit.  The results of significant diagnostics from this hospitalization (including imaging, microbiology, ancillary and laboratory) are listed below for reference.    Significant Diagnostic Studies: Dg Chest 2 View  11/19/2015  CLINICAL DATA:  Shortness of breath. EXAM: CHEST  2 VIEW COMPARISON:  11/03/2015. FINDINGS: Normal sized heart. Interval mild patchy opacity in the right upper lobe and superior segment of the right lower lobe. Decreased prominence of the interstitial markings. The hila are more prominent, rounded and denser, specially on the right. Mild thoracic spine degenerative changes. IMPRESSION: 1. Interval probable mild pneumonia in the right upper lobe and superior segment of the right lower lobe. 2. Interval possible bilateral hilar adenopathy, greater than on the right. 3. Improving interstitial pulmonary edema. Electronically Signed   By: Beckie Salts M.D.   On: 11/19/2015 07:41   Ct Head Wo Contrast  11/22/2015  CLINICAL DATA:  Felt a pop in left eye.  Severe headache. EXAM: CT HEAD WITHOUT CONTRAST TECHNIQUE: Contiguous axial images were obtained from the base of the skull through the vertex without intravenous contrast. COMPARISON:  None. FINDINGS: Low-density throughout the deep white matter, likely chronic microvascular disease. No acute intracranial abnormality. Specifically, no hemorrhage, hydrocephalus, mass lesion, acute infarction, or significant intracranial injury.  No acute calvarial abnormality. Visualized paranasal sinuses and mastoids clear. Orbital soft tissues unremarkable. IMPRESSION: Low-density throughout the deep white matter, likely chronic small vessel disease. Electronically Signed   By: Charlett Nose M.D.   On: 11/22/2015 10:36   Ct Angio Chest Pe W/cm &/or Wo Cm  11/30/2015  CLINICAL DATA:  Shortness of breath EXAM: CT ANGIOGRAPHY CHEST WITH CONTRAST TECHNIQUE: Multidetector CT imaging of the chest was performed using the standard protocol during bolus administration of intravenous contrast. Multiplanar CT image reconstructions and MIPs were obtained to evaluate the vascular anatomy. CONTRAST:  OMNIPAQUE IOHEXOL 350 MG/ML SOLN COMPARISON:  11/13/2015 FINDINGS: THORACIC INLET/BODY WALL: No acute abnormality. MEDIASTINUM: Cardiomegaly with dilated left ventricle. Atherosclerosis, including the coronary arteries with previous left circulation stenting. No systemic arterial enhancement; no evidence of acute aortic syndrome. CTA of the pulmonary arteries is limited by respiratory motion, best obtainable given patient's clinical circumstances (on BiPAP). When allowing for distortion by motion, there is no visualized pulmonary embolism. LUNG WINDOWS: There is symmetric bilateral perihilar airspace opacity with diffuse interlobular septal thickening and moderate layering pleural effusions. The pattern is consistent with pulmonary edema. In light of recent diagnosis of pneumonia, there could be superimposed airspace infection. No history of hemoptysis. Clear central airways. UPPER ABDOMEN: No acute findings. OSSEOUS: No acute fracture.  No suspicious lytic or blastic lesions. Review of the MIP images confirms the above findings. IMPRESSION: 1. CHF pattern, including moderate pleural effusions. 2. Negative for pulmonary embolism. 3. Especially given recent admission for pneumonia, there may be superimposed airway infection. Electronically Signed   By: Marnee Spring M.D.   On: 11/30/2015 06:01   Dg Chest Port 1 View  11/30/2015  CLINICAL DATA:  Shortness of breath. EXAM: PORTABLE CHEST 1 VIEW COMPARISON:  11/21/2015 FINDINGS: Bilateral airspace disease, asymmetric to the left. There is also interstitial coarsening with [employer]. Small pleural effusions. Cardiopericardial enlargement. IMPRESSION: 1. CHF pattern. 2. Asymmetric left-sided airspace disease, asymmetric edema versus pneumonia. Electronically Signed   By: Marnee Spring M.D.   On: 11/30/2015 03:25   Dg Chest Port 1 View  11/21/2015  CLINICAL DATA:  CHF. EXAM: PORTABLE CHEST 1 VIEW COMPARISON:  11/20/2015.  11/19/2015. FINDINGS: Interim extubation removal of NG tube. Mediastinum and hilar structures normal. Heart size stable. No pulmonary venous congestion. No focal infiltrate. No pleural effusion or pneumothorax. IMPRESSION: 1. Interim extubation removal of NG tube. 2. No acute cardiopulmonary  disease. Interim clearing of previously identified right lung infiltrate. Electronically Signed   By: Maisie Fus  Register   On: 11/21/2015 07:30   Dg Chest Port 1 View  11/20/2015  CLINICAL DATA:  Acute respiratory failure. EXAM: PORTABLE CHEST 1 VIEW COMPARISON:  11/19/2015. FINDINGS: Endotracheal tube and NG tube in stable position. Mediastinum and hilar structures are normal. Heart size normal. Interval near complete clearing of right lung infiltrate. No pleural effusion or pneumothorax. IMPRESSION: 1. Endotracheal tube and NG tube in stable position. 2. Interim near complete clearing of right lung infiltrate. Electronically Signed   By: Maisie Fus  Register   On: 11/20/2015 07:09   Dg Chest Port 1 View  11/19/2015  CLINICAL DATA:  Check endotracheal tube position EXAM: PORTABLE CHEST 1 VIEW COMPARISON:  11/19/2015 . FINDINGS: Endotracheal tube has been advanced and now lies 5.2 cm above the carina in satisfactory position. Improved aeration is noted within the lungs bilaterally but particularly on the left.  Some residual changes in the right lung are noted. Cardiac shadow is within normal limits. No other focal abnormality is seen. IMPRESSION: Improved endotracheal tube position. Improved aeration on the left with persistent airspace disease in the right lung. Electronically Signed   By: Alcide Clever M.D.   On: 11/19/2015 16:27   Dg Chest Port 1 View  11/19/2015  CLINICAL DATA:  Respiratory distress.  Endotracheal tube placement. EXAM: PORTABLE CHEST 1 VIEW COMPARISON:  Earlier film, same date. FINDINGS: The endotracheal tube is above the thoracic inlet, 13 cm above the carina. This should be advanced at least 8 cm. Persistent bilateral airspace process. No pleural effusion or pneumothorax. IMPRESSION: The endotracheal tube is 13 cm above the carina and should be advanced at least 8 cm. Persistent bilateral airspace process. Electronically Signed   By: Rudie Meyer M.D.   On: 11/19/2015 13:39   Dg Abd Portable 1v  11/19/2015  CLINICAL DATA:  Orogastric tube placement. Congestive heart failure and atrial fibrillation. Myocardial infarct. EXAM: PORTABLE ABDOMEN - 1 VIEW COMPARISON:  None. FINDINGS: Orogastric tube is seen with tip overlying the distal gastric antrum. No evidence of dilated bowel loops. IMPRESSION: Orogastric tube tip overlies the distal gastric antrum. Electronically Signed   By: Myles Rosenthal M.D.   On: 11/19/2015 18:32   Ct Orbitss W/o Cm  11/22/2015  CLINICAL DATA:  Felt a pop in left eye. Severe headache. Blurred vision in left eye. Photosensitivity. EXAM: CT ORBITS WITHOUT CONTRAST TECHNIQUE: Multidetector CT imaging of the orbits was performed following the standard protocol without intravenous contrast. COMPARISON:  None. FINDINGS: Orbital soft tissues are unremarkable. Globes are intact. No orbital fracture. Visualized facial bones are intact. Paranasal sinuses are clear. Mastoid air cells are clear. IMPRESSION: No evidence of bony or soft tissue or focal abnormality. Electronically  Signed   By: Charlett Nose M.D.   On: 11/22/2015 10:39    Microbiology: Recent Results (from the past 240 hour(s))  Blood culture (routine x 2)     Status: None (Preliminary result)   Collection Time: 11/30/15  5:10 AM  Result Value Ref Range Status   Specimen Description BLOOD RIGHT ANTECUBITAL  Final   Special Requests   Final    BOTTLES DRAWN AEROBIC AND ANAEROBIC 5CC BOTH BOTTLES   Culture   Final    NO GROWTH 2 DAYS Performed at [facility]    Report Status PENDING  Incomplete  Blood culture (routine x 2)     Status: None (Preliminary result)   Collection  Time: 11/30/15  5:30 AM  Result Value Ref Range Status   Specimen Description BLOOD LEFT ANTECUBITAL  Final   Special Requests IN PEDIATRIC BOTTLE  Final   Culture   Final    NO GROWTH 2 DAYS Performed at [facility]    Report Status PENDING  Incomplete     Labs: CBC:  Recent Labs Lab 11/30/15 0335 11/30/15 0349 12/01/15 0405  WBC 13.7*  --  10.3  NEUTROABS 10.2*  --   --   HGB 11.7* 13.6 12.7*  HCT 34.7* 40.0 38.5*  MCV 85.7  --  86.9  PLT 243  --  272   Basic Metabolic Panel:  Recent Labs Lab 11/30/15 0335 11/30/15 0349 11/30/15 1515 12/01/15 0405  NA 138 140 137 142  K 4.2 4.1 3.5 3.5  CL 107 105 100* 105  CO2 22  --  26 26  GLUCOSE 337* 327* 237* 164*  BUN 22* 21* 19 24*  CREATININE 1.22 1.00 1.13 1.24  CALCIUM 9.0  --  9.0 9.0  MG  --   --   --  2.0   Liver Function Tests:  Recent Labs Lab 11/30/15 0335  AST 43*  ALT 41  ALKPHOS 148*  BILITOT 0.6  PROT 7.3  ALBUMIN 2.9*    Recent Labs Lab 11/30/15 0335  LIPASE 15   No results for input(s): AMMONIA in the last 168 hours. Cardiac Enzymes: No results for input(s): CKTOTAL, CKMB, CKMBINDEX, TROPONINI in the last 168 hours. BNP (last 3 results)  Recent Labs  11/03/15 0004 11/19/15 0804 11/30/15 0335  BNP 571.9* 1227.4* 870.4*   CBG:  Recent Labs Lab 11/30/15 1237 11/30/15 1657 11/30/15 2132  12/01/15 0740 12/01/15 1109  GLUCAP 472* 253* 82 220* 194*   Time spent: 30 minutes  Signed:  Samuel Mcpeek  [facility] 12/01/2015, 7:44 AM

## 2015-12-04 ENCOUNTER — Encounter: Payer: Self-pay | Admitting: Cardiovascular Disease

## 2015-12-04 ENCOUNTER — Ambulatory Visit (INDEPENDENT_AMBULATORY_CARE_PROVIDER_SITE_OTHER): Payer: Self-pay | Admitting: Cardiovascular Disease

## 2015-12-04 ENCOUNTER — Telehealth: Payer: Self-pay

## 2015-12-04 VITALS — BP 110/82 | HR 86 | Ht 72.0 in | Wt 164.1 lb

## 2015-12-04 DIAGNOSIS — I5043 Acute on chronic combined systolic (congestive) and diastolic (congestive) heart failure: Secondary | ICD-10-CM

## 2015-12-04 DIAGNOSIS — I340 Nonrheumatic mitral (valve) insufficiency: Secondary | ICD-10-CM

## 2015-12-04 DIAGNOSIS — E785 Hyperlipidemia, unspecified: Secondary | ICD-10-CM

## 2015-12-04 DIAGNOSIS — I10 Essential (primary) hypertension: Secondary | ICD-10-CM

## 2015-12-04 DIAGNOSIS — I25118 Atherosclerotic heart disease of native coronary artery with other forms of angina pectoris: Secondary | ICD-10-CM

## 2015-12-04 DIAGNOSIS — Z720 Tobacco use: Secondary | ICD-10-CM

## 2015-12-04 DIAGNOSIS — I4891 Unspecified atrial fibrillation: Secondary | ICD-10-CM

## 2015-12-04 DIAGNOSIS — F1410 Cocaine abuse, uncomplicated: Secondary | ICD-10-CM

## 2015-12-04 DIAGNOSIS — I4892 Unspecified atrial flutter: Secondary | ICD-10-CM

## 2015-12-04 NOTE — Telephone Encounter (Signed)
[facility] Post-discharge Follow-Up Phone Call:   Date of Discharge: 12/01/15 Principal Discharge Diagnosis(es): Acute on chronic combined systolic and diastolic congestive heart failure Post-discharge Communication: Attempt #2 to reach patient and complete discharge follow-up phone call.  Call placed to #[phone]; unable to reach patient.  Voicemail left requesting return call. Also placed call to #[phone]; unable to reach patient. Additional voicemail left requesting return call. Call completed: No

## 2015-12-04 NOTE — Progress Notes (Signed)
Patient ID: Sean Hinton, male   DOB: [birth year], 55 y.o.   MRN: [id number]     Cardiology Office Note    Date:  12/06/2015   ID:  Sean Hinton, DOB May 14, 1961, MRN [id number]  PCP:  Sean Paula, MD  Cardiologist:   [name], MD   Chief Complaint  Patient presents with  . Follow-up    1 week//cardiomyopathy//pt c/o SOB on exertion--on an incline, pt states no Sx.    History of Present Illness:  Sean Hinton is a 55 y.o. male with premature coronary artery disease, ischemic cardiomyopathy, history of paroxysmal atrial fibrillation and previous syncope related to paroxysmal atrial flutter with one-to-one AV conduction, severe hypertension, hyperlipidemia, type 2 diabetes mellitus, cocaine abuse, tobacco abuse returns for follow-up.  I last saw him on January 12 during the hospitalization for acute myocardial infarction due to thrombotic occlusion of the ramus intermedius artery in the setting of cocaine abuse. He has been hospitalized again on January 25 with acute heart failure exacerbation after again using cocaine. He had acute hypercapnic respiratory failure and required brief (roughly 24-hour) intubation and mechanical ventilation. There was a suspicion for aspiration pneumonia and he also received antibiotics. UDS was positive only for cocaine but also for barbiturates. Had atrial fibrillation with rapid ventricular response requiring amiodarone drip followed by oral amiodarone. Again his beta blockers had to be stopped and he was placed on amlodipine. Had only a minor increase in cardiac troponin this time. EF by echo was 40-45 %. Continues to have severe mitral regurgitation.Transient acute kidney injury resolved by hospital discharge. Hemoglobin A1c was 9.9%. During the previous hospitalization he had vehemently professed that he would never use cocaine again. He was discharged on January 29.   On February 5th he was seen with hypoxic respiratory failure in the [facility] emergency room. He states he had cough productive of copious greenish mucus. His chest x-ray showed bilateral pulmonary edema and he required BiPAP. He improved and was discharged from the hospital on February 6. Again, his urine drug screen was positive for cocaine (urine drug screens positive on December 20, January 9, January 25, February 5). He has not had a clean drug screen in the last 2 months.  Today he reports feeling well. He appears calm and reasonable. He is clearly in denial about the fact that he is a cocaine addict and will not admit that he has recently used cocaine. He believes that his urine drug screens have state positive since he last used cocaine weeks ago. He continues to steadily lose weight. He has lost about 40 pounds in the last year. While he is still at a healthy weight, I'm afraid that his weight loss is secondary to his drug habits rather than healthy diet and exercise.   Past Medical History  Diagnosis Date  . Diabetes mellitus with complication (HCC)   . Hypertension   . Syncope     a. 02/2015 - felt 2/2 rapid AF/AFL.  Marland Kitchen Atrial fibrillation and flutter (HCC)     a. Dx 02/2015 - placed on Xarelto; b. 09/2015 recurrence in setting of PNA.  . Ischemic cardiomyopathy     a. 09/2015 Echo: EF 35-40%.  Marland Kitchen CAD (coronary artery disease)     a. Dx 02/2015 - 2V CAD with CTO LAD, diffuse dz in PDA, OM - med rx.  . Hypertensive heart disease   . Hyperlipidemia   . Mitral regurgitation     a. Mod by echo 02/2015,  .  Tobacco abuse   . Chronic systolic CHF (congestive heart failure) (HCC)     a. 09/2015 Echo: EF 35-40%, sev apical/antlat, apical HK, mild MR, mildly dil LA.  Marland Kitchen ARF (acute respiratory failure) (HCC)   . Pneumonia 09/2015  . GERD (gastroesophageal reflux disease)   . Headache   . Arthritis     ra  . NSTEMI (non-ST elevated myocardial infarction) (HCC) 10/2015    Past Surgical History  Procedure Laterality Date  . Right thumb surgery     . Cardiac  catheterization N/A 02/24/2015    Procedure: Left Heart Cath and Coronary Angiography;  Surgeon: Marykay Lex, MD;  Location: [facility] INVASIVE CV LAB CUPID;  Service: Cardiovascular;  Laterality: N/A;  . Cardiac catheterization  02/24/2015    Procedure: Coronary/Bypass Graft CTO Intervention;  Surgeon: Marykay Lex, MD;  Location: [facility] INVASIVE CV LAB CUPID;  Service: Cardiovascular;;  . Cardiac catheterization N/A 11/03/2015    Procedure: Left Heart Cath and Coronary Angiography;  Surgeon: Tonny Bollman, MD;  Location: [facility] INVASIVE CV LAB;  Service: Cardiovascular;  Laterality: N/A;  . Cardiac catheterization N/A 11/03/2015    Procedure: Coronary Stent Intervention;  Surgeon: Tonny Bollman, MD;  Location: [facility] INVASIVE CV LAB;  Service: Cardiovascular;  Laterality: N/A;  RAMUS    Outpatient Prescriptions Prior to Visit  Medication Sig Dispense Refill  . amiodarone (PACERONE) 200 MG tablet Take 1 tablet (200 mg total) by mouth daily. 30 tablet 0  . amLODipine (NORVASC) 5 MG tablet Take 1 tablet (5 mg total) by mouth daily. 30 tablet 0  . atorvastatin (LIPITOR) 80 MG tablet Take 1 tablet (80 mg total) by mouth every evening. 30 tablet 3  . clopidogrel (PLAVIX) 75 MG tablet Take 1 tablet (75 mg total) by mouth daily with breakfast. 30 tablet 0  . Dexlansoprazole (DEXILANT) 30 MG capsule Take 1 capsule (30 mg total) by mouth daily. 90 capsule 3  . furosemide (LASIX) 40 MG tablet Take 1 tablet (40 mg total) by mouth daily. 30 tablet 3  . glipiZIDE (GLUCOTROL) 10 MG tablet Take 1 tablet (10 mg total) by mouth 2 (two) times daily before a meal. (Patient taking differently: Take 5 mg by mouth daily before breakfast. ) 60 tablet 0  . hydrALAZINE (APRESOLINE) 25 MG tablet Take 2 tablets (50 mg total) by mouth 3 (three) times daily. 90 tablet 0  . ipratropium-albuterol (DUONEB) 0.5-2.5 (3) MG/3ML SOLN Take 3 mLs by nebulization 2 (two) times daily. 360 mL 10  . isosorbide mononitrate (IMDUR) 30 MG 24 hr tablet Take  1 tablet (30 mg total) by mouth daily. 30 tablet 2  . nitroGLYCERIN (NITROSTAT) 0.4 MG SL tablet Place 1 tablet (0.4 mg total) under the tongue every 5 (five) minutes as needed for chest pain (up to 3 doses). 25 tablet 3  . potassium chloride SA (K-DUR,KLOR-CON) 20 MEQ tablet Take 1 tablet (20 mEq total) by mouth daily. 30 tablet 3  . rivaroxaban (XARELTO) 20 MG TABS tablet Take 1 tablet (20 mg total) by mouth daily with supper. (Patient taking differently: Take 20 mg by mouth daily. ) 30 tablet 3  . traMADol (ULTRAM) 50 MG tablet Take 1 tablet (50 mg total) by mouth every 6 (six) hours as needed for severe pain. 30 tablet 0  . albuterol (PROVENTIL HFA;VENTOLIN HFA) 108 (90 BASE) MCG/ACT inhaler Inhale 2 puffs into the lungs every 6 (six) hours as needed for wheezing or shortness of breath. 1 Inhaler 2  . aspirin 81  MG chewable tablet Chew 1 tablet (81 mg total) by mouth daily. 30 tablet 0   No facility-administered medications prior to visit.     Allergies:   Review of patient's allergies indicates no known allergies.   Social History   Social History  . Marital Status: Single    Spouse Name: N/A  . Number of Children: N/A  . Years of Education: N/A   Social History Main Topics  . Smoking status: Current Every Day Smoker -- 0.10 packs/day for 30 years    Types: Cigarettes  . Smokeless tobacco: Never Used  . Alcohol Use: No  . Drug Use: Yes    Special: Cocaine     Comment: "last use 1991", Positive UDS 10/2015  . Sexual Activity: Not Asked   Other Topics Concern  . None   Social History Narrative     Family History:  The patient's family history includes Cancer in his mother and sister; Diabetes in his brother, father, mother, and sister; Heart attack in his brother, father, and mother; Heart disease in his brother, father, and mother; Hypertension in his brother, father, mother, and sister; Stroke in his brother.   ROS:   Please see the history of present illness.    ROS All  other systems reviewed and are negative.   PHYSICAL EXAM:   VS:  BP 110/82 mmHg  Pulse 86  Ht 6' (1.829 m)  Wt 74.435 kg (164 lb 1.6 oz)  BMI 22.25 kg/m2   GEN: Well nourished, well developed, in no acute distress HEENT: normal Neck: no JVD, carotid bruits, or masses Cardiac: RRR; 3/6 apical holosystolic murmur apical heave, no diastolic murmurs, rubs, or gallops,no edema  Respiratory:  clear to auscultation bilaterally, normal work of breathing GI: soft, nontender, nondistended, + BS MS: no deformity or atrophy Skin: warm and dry, no rash Neuro:  Alert and Oriented x 3, Strength and sensation are intact Psych: euthymic mood, full affect  Wt Readings from Last 3 Encounters:  12/04/15 74.435 kg (164 lb 1.6 oz)  12/01/15 71.4 kg (157 lb 6.5 oz)  11/25/15 77.111 kg (170 lb)      Studies/Labs Reviewed:   EKG:  EKG is ordered today.  The ekg ordered today demonstrates sinus rhythm, right atrial enlargement, QS pattern in leads V1-V3, no acute ST segment changes  Recent Labs: 11/03/2015: TSH 0.447 11/30/2015: ALT 41; B Natriuretic Peptide 870.4* 12/01/2015: BUN 24*; Creatinine, Ser 1.24; Hemoglobin 12.7*; Magnesium 2.0; Platelets 272; Potassium 3.5; Sodium 142   Lipid Panel    Component Value Date/Time   CHOL 128 10/13/2015 0726   TRIG 69 11/19/2015 1356   HDL 35* 10/13/2015 0726   CHOLHDL 3.7 10/13/2015 0726   VLDL 13 10/13/2015 0726   LDLCALC 80 10/13/2015 0726    Additional studies/ records that were reviewed today include:  Multiple records related to his last 2 hospitalizations    ASSESSMENT:    1. Acute on chronic combined systolic and diastolic congestive heart failure (HCC)   2. Mitral regurgitation   3. Coronary artery disease involving native coronary artery of native heart with other form of angina pectoris (HCC)   4. Atrial fibrillation and flutter (HCC)   5. Severe essential hypertension   6. Tobacco use   7. Cocaine abuse   8. Hyperlipidemia       PLAN:  In order of problems listed above:  1. Acute on chronic HF: He has repeatedly presented with acute pulmonary edema. He has had cocaine in his  drug screen on each occasion. This is occurring on the background of moderate ischemic cardiomyopathy and severe mitral insufficiency. It is hard to say how compliant he is with his medications but he reports that he is taking ever prescribed medication. Beta blockers had to be stopped again since he is repeatedly using cocaine. He is not on ACE inhibitor as but is taking hydralazine/nitrates. 2. MR: Not a surgical candidate until he proves sobriety. Even then it is not clear that his valvular problem is amenable to repair since LVEF is already low and the mechanism is likely ischemic. 3. CAD: Known chronic total occlusion of the LAD artery with mostly apical scar, recent acute MI related to thrombotic occlusion of the ramus intermedius treated with a drug-eluting stent, diffusely diseased right coronary artery. He denies angina pectoris at this time. He reports compliance with clopidogrel. It is been a month since his stent placement and will stop the aspirin to reduce bleeding risk. He will continue taking clopidogrel and Xarelto. 4. PAF: Had recurrent exacerbations with rapid ventricular response in the setting of cocaine abuse and/or heart failure exacerbation. Unclear if absent the use of cocaine his ventricular rate control would be adequate. He is now on amiodarone 200 mg daily. Very high embolic risk. Continue Xarelto 5. HTN: His blood pressure is well controlled and he is requiring fewer medications than lower doses for control. This is likely related to substantial weight loss. 6. In the long run it is critically important to stop tobacco use, but the focus should be on immediate cessation of cocaine use. 7. Cocaine abuse: His prognosis is very poor. He refuses to admit that he uses cocaine and does not realize that he is addicted. He is very  intelligent and is a smooth talker and gets his way out of the debate by changing to alternatives topics. I think the odds are high that he will die unless he stops using cocaine. I have told him this in no uncertain terms on more than one occasion. He has a good system of support with a steady girlfriend and a caring large family. I think if he acknowledges the problem and confronts it he has a reasonably good chance of overcoming it. If he doesn't, it is certain that the combination of serious cardiac problems and cocaine use will lead to a fatal outcome 8. HLP:  High-dose statin. Will be due a repeat lipid profile in a couple of months    Medication Adjustments/Labs and Tests Ordered: Current medicines are reviewed at length with the patient today.  Concerns regarding medicines are outlined above.  Medication changes, Labs and Tests ordered today are listed in the Patient Instructions below. Patient Instructions  Your physician has recommended you make the following change in your medication: STOP THE ASPIRIN TODAY  Dr. Royann Shivers recommends that you schedule a follow-up appointment in: 3 MONTHS         Signed, Dmarion Perfect, MD  12/06/2015 8:28 PM    [facility] [address] [place], [place], [place]  [address] Phone: [phone]; Fax: [phone]

## 2015-12-04 NOTE — Patient Instructions (Signed)
Your physician has recommended you make the following change in your medication: STOP THE ASPIRIN TODAY  Dr. Royann Shivers recommends that you schedule a follow-up appointment in: 3 MONTHS

## 2015-12-05 ENCOUNTER — Telehealth: Payer: Self-pay

## 2015-12-05 ENCOUNTER — Other Ambulatory Visit: Payer: Self-pay

## 2015-12-05 LAB — CULTURE, BLOOD (ROUTINE X 2)
CULTURE: NO GROWTH
Culture: NO GROWTH

## 2015-12-05 MED ORDER — ALBUTEROL SULFATE HFA 108 (90 BASE) MCG/ACT IN AERS: 2.0000 | INHALATION_SPRAY | Freq: Four times a day (QID) | RESPIRATORY_TRACT | Status: DC | PRN

## 2015-12-05 MED FILL — VENTOLIN HFA 90 MCG INHALER: 108 (90 BAS | 25 days supply | Qty: 18 | Fill #0

## 2015-12-05 NOTE — Telephone Encounter (Signed)
[facility] Post-discharge Follow-Up Phone Call:  Date of Discharge: 12/01/15 Principal Discharge Diagnosis(es): Acute on chronic combined systolic and diastolic congestive heart failure Post-discharge Communication: Received return call from patient and discharge follow-up phone call completed. Call Completed: Yes                    With Whom: Patient   Please check all that apply:  X  Patient is knowledgeable of his/her condition(s) and/or treatment. X  Patient is caring for self at home.  ? Patient is receiving assist at home from family and/or caregiver. Family and/or caregiver is knowledgeable of patient's condition(s) and/or treatment. ? Patient is receiving home health services. If so, name of agency.     Medication Reconciliation:  X  Patient obtained all discharge medications-NO. Patient's discharge medications reviewed thoroughly. Patient indicated he had not picked up his discharge medications because he did not have money for medications. Medications reviewed and informed patient that amlodipine and hydralazine at clinic ready for pick-up.  [facility] resources discussed thoroughly. Informed patient that he can always get needed medications from pharmacy and to make payments as able on his account. Patient verbalized understanding and indicated he also needed a refill of albuterol.  Spoke with Dr. Venetia Night who was agreeable to albuterol being refilled. Verbal order entered.  Patient indicated he planned to pick up his medications later today, and he had a "church friend" who would be assisting with payment. Pharmacy resources reiterated. Patient verbalized understanding. This Case Manager also informed patient that per discharge summary he is to STOP taking metoprolol tartrate. Patient verbalized understanding.   Activities of Daily Living:  X  Independent ? Needs assist  ? Total Care    Community resources in place for patient:  X   None  ? Home Health/Home DME ? Assisted Living ? Support Group         Questions/Concerns discussed: This Case Manager placed call to complete post-discharge follow-up phone call and check on status. Patient indicated he was doing well. Denied shortness of breath or health concerns at this time. Reminded patient of [facility] appointment on 12/09/15 at 1200 with Dr. Venetia Night. Patient indicated he planned to be at appointment and no transportation barriers noted.

## 2015-12-08 ENCOUNTER — Telehealth: Payer: Self-pay

## 2015-12-08 NOTE — Telephone Encounter (Signed)
Call placed to the patient to confirm his appointment for tomorrow, 12/09/15 @ 1200. He said that he would be there and he has transportation to the clinic. He noted that he is " feeling better by the day."  He then said that this is the first weekend that he he has not been in the hospital.  He noted that he picked up his medications at the [facility] pharmacy last week and this was confirmed by this CM with Margie Billet, Pharmacist  - [facility].  As per Carlis Stable, Pharmacy tech [facility], the patient's friend paid $30 on his account. Instructed him to bring his medications to his appointment for the doctor to review and he stated that he would.  This CM explained to the patient that the pharmacy will work with him to get his medications if he is having difficulty affording the medications. If he needs to make a payment on the balance, even a very small amount paid on the account can be made to show his effort to pay off the balance. He said that he understands and noted that he has been approved for the orange card and is still waiting for his medicaid to be approved. He said that he has also applied for disability.  He stated that he resubmitted his application for food stamps and has requested an increase to the $194/month that he currently receives.    No other questions/concerns reported at this time.

## 2015-12-09 ENCOUNTER — Encounter (HOSPITAL_COMMUNITY): Payer: Self-pay

## 2015-12-09 ENCOUNTER — Ambulatory Visit: Payer: Self-pay | Admitting: Family Medicine

## 2015-12-09 ENCOUNTER — Emergency Department (HOSPITAL_COMMUNITY)
Admission: EM | Admit: 2015-12-09 | Discharge: 2015-12-09 | Disposition: A | Discharge status: Home / Self Care | Payer: MEDICAID | Attending: Emergency Medicine | Admitting: Emergency Medicine

## 2015-12-09 ENCOUNTER — Telehealth: Payer: Self-pay

## 2015-12-09 ENCOUNTER — Emergency Department (HOSPITAL_COMMUNITY): Payer: MEDICAID

## 2015-12-09 DIAGNOSIS — I2510 Atherosclerotic heart disease of native coronary artery without angina pectoris: Secondary | ICD-10-CM | POA: Insufficient documentation

## 2015-12-09 DIAGNOSIS — I110 Hypertensive heart disease with heart failure: Secondary | ICD-10-CM | POA: Insufficient documentation

## 2015-12-09 DIAGNOSIS — I5023 Acute on chronic systolic (congestive) heart failure: Secondary | ICD-10-CM

## 2015-12-09 DIAGNOSIS — I5022 Chronic systolic (congestive) heart failure: Secondary | ICD-10-CM

## 2015-12-09 DIAGNOSIS — I10 Essential (primary) hypertension: Secondary | ICD-10-CM

## 2015-12-09 DIAGNOSIS — Z7984 Long term (current) use of oral hypoglycemic drugs: Secondary | ICD-10-CM | POA: Insufficient documentation

## 2015-12-09 DIAGNOSIS — Z7902 Long term (current) use of antithrombotics/antiplatelets: Secondary | ICD-10-CM | POA: Insufficient documentation

## 2015-12-09 DIAGNOSIS — Z8701 Personal history of pneumonia (recurrent): Secondary | ICD-10-CM | POA: Insufficient documentation

## 2015-12-09 DIAGNOSIS — I252 Old myocardial infarction: Secondary | ICD-10-CM | POA: Insufficient documentation

## 2015-12-09 DIAGNOSIS — F1410 Cocaine abuse, uncomplicated: Secondary | ICD-10-CM

## 2015-12-09 DIAGNOSIS — R0602 Shortness of breath: Secondary | ICD-10-CM | POA: Insufficient documentation

## 2015-12-09 DIAGNOSIS — F17200 Nicotine dependence, unspecified, uncomplicated: Secondary | ICD-10-CM | POA: Diagnosis present

## 2015-12-09 DIAGNOSIS — E785 Hyperlipidemia, unspecified: Secondary | ICD-10-CM | POA: Insufficient documentation

## 2015-12-09 DIAGNOSIS — E119 Type 2 diabetes mellitus without complications: Secondary | ICD-10-CM | POA: Insufficient documentation

## 2015-12-09 DIAGNOSIS — R0600 Dyspnea, unspecified: Secondary | ICD-10-CM | POA: Insufficient documentation

## 2015-12-09 DIAGNOSIS — E139 Other specified diabetes mellitus without complications: Secondary | ICD-10-CM | POA: Diagnosis present

## 2015-12-09 DIAGNOSIS — Z79899 Other long term (current) drug therapy: Secondary | ICD-10-CM | POA: Insufficient documentation

## 2015-12-09 DIAGNOSIS — I255 Ischemic cardiomyopathy: Secondary | ICD-10-CM | POA: Diagnosis present

## 2015-12-09 DIAGNOSIS — K219 Gastro-esophageal reflux disease without esophagitis: Secondary | ICD-10-CM | POA: Insufficient documentation

## 2015-12-09 LAB — RAPID URINE DRUG SCREEN, HOSP PERFORMED
AMPHETAMINES: NOT DETECTED
BARBITURATES: NOT DETECTED
BENZODIAZEPINES: NOT DETECTED
Cocaine: POSITIVE — AB
Opiates: NOT DETECTED
Tetrahydrocannabinol: NOT DETECTED

## 2015-12-09 LAB — PROTIME-INR
INR: 1.37 (ref 0.00–1.49)
Prothrombin Time: 17 seconds — ABNORMAL HIGH (ref 11.6–15.2)

## 2015-12-09 LAB — BASIC METABOLIC PANEL
Anion gap: 12 (ref 5–15)
BUN: 18 mg/dL (ref 6–20)
CHLORIDE: 103 mmol/L (ref 101–111)
CO2: 24 mmol/L (ref 22–32)
CREATININE: 1.22 mg/dL (ref 0.61–1.24)
Calcium: 9.6 mg/dL (ref 8.9–10.3)
GFR calc Af Amer: >60 mL/min (ref >60)
GFR calc non Af Amer: >60 mL/min (ref >60)
Glucose, Bld: 330 mg/dL — ABNORMAL HIGH (ref 65–99)
Potassium: 4.9 mmol/L (ref 3.5–5.1)
Sodium: 139 mmol/L (ref 135–145)

## 2015-12-09 LAB — BRAIN NATRIURETIC PEPTIDE: B Natriuretic Peptide: 795.1 pg/mL — ABNORMAL HIGH (ref 0.0–100.0)

## 2015-12-09 LAB — CBC
HCT: 39 % (ref 39.0–52.0)
Hemoglobin: 12.7 g/dL — ABNORMAL LOW (ref 13.0–17.0)
MCH: 28.2 pg (ref 26.0–34.0)
MCHC: 32.6 g/dL (ref 30.0–36.0)
MCV: 86.5 fL (ref 78.0–100.0)
PLATELETS: 261 10*3/uL (ref 150–400)
RBC: 4.51 MIL/uL (ref 4.22–5.81)
RDW: 15.2 % (ref 11.5–15.5)
WBC: 10.4 10*3/uL (ref 4.0–10.5)

## 2015-12-09 LAB — I-STAT TROPONIN, ED: Troponin i, poc: 0.04 ng/mL (ref 0.00–0.08)

## 2015-12-09 MED ORDER — ASPIRIN 81 MG PO CHEW
324.0000 mg | CHEWABLE_TABLET | Freq: Once | ORAL | Status: AC
  Administered 2015-12-09: 324 mg via ORAL
  Filled 2015-12-09: qty 4

## 2015-12-09 MED ORDER — NITROGLYCERIN 2 % TD OINT
1.0000 [in_us] | TOPICAL_OINTMENT | Freq: Once | TRANSDERMAL | Status: AC
  Administered 2015-12-09: 1 [in_us] via TOPICAL
  Filled 2015-12-09: qty 1

## 2015-12-09 MED ORDER — MORPHINE SULFATE (PF) 4 MG/ML IV SOLN
4.0000 mg | Freq: Once | INTRAVENOUS | Status: DC
  Filled 2015-12-09: qty 1

## 2015-12-09 MED ORDER — NITROGLYCERIN 0.4 MG SL SUBL: 0.4000 mg | SUBLINGUAL_TABLET | SUBLINGUAL | Status: DC | PRN

## 2015-12-09 MED ORDER — LORAZEPAM 1 MG PO TABS
0.5000 mg | ORAL_TABLET | Freq: Once | ORAL | Status: AC
  Administered 2015-12-09: 0.5 mg via ORAL
  Filled 2015-12-09: qty 1

## 2015-12-09 MED ORDER — FUROSEMIDE 10 MG/ML IJ SOLN
40.0000 mg | Freq: Once | INTRAMUSCULAR | Status: AC
  Administered 2015-12-09: 40 mg via INTRAVENOUS
  Filled 2015-12-09: qty 4

## 2015-12-09 NOTE — Consult Note (Signed)
[facility] Medical Consultation  Sean Hinton POL:[id number] DOB: Jan 07, 1961 DOA: 12/09/2015 PCP: Lora Paula, MD   Requesting physician: Jodi Mourning Date of consultation: 12/09/2015 Reason for consultation: possible admission  Impression/Recommendations Principal Problem:   Dyspnea Active Problems:   Other specified diabetes mellitus without complications (HCC)   Essential hypertension   Smoking   Diabetes mellitus without complication (HCC)   Cardiomyopathy, ischemic   Chest pain   Cocaine abuse   #1. Dyspnea. Oxygen saturation greater than 90% on room air. Chest x-ray patchy opacities improved from image done last week. Troponin negative EKG without acute changes. No weight gain. He's afebrile hemodynamically stable exam with good air movement. May have component of anxiety. He pants versus good deep breathing on exam. -Discharge to home -has follow up apt with pulmonary 12/12/15 -may benefit from PFT  #2. Hypertension. Fair control in the emergency department. -Continue home meds  #3. Chest pain. Complaint of chest pain on presentation. Denied chest pain on my exam. Troponins negative EKG without acute changes.  #4. Cardiomyopathy. Echo done in January reveals EF of 40% moderate LVH severe mitral regurg. Chest x-ray as noted above. BNP 795. No weight gain. Home meds include amiodarone Coreg Lanoxin diltiazem M Lasix -Was given 40 mg Lasix IV in the emergency department -Continue home meds  5. Diabetes. History of noncompliance. Glucose 330 on presentation. Home meds include  Glipizide -Has PCP appointment the 21st   I will followup again tomorrow. Please contact me if I can be of assistance in the meanwhile. Thank you for this consultation.  Chief Complaint: sob  HPI: 55 year old with a past medical history that includes hypertension, cocaine abuse, cardiomyopathy, diabetes presents emergency department with chief complaint chest pain shortness of breath.  He describes the pain as intermittent sharp and located left anterior nonradiating. He denies nausea vomiting diaphoresis headache dizziness syncope or near-syncope. Denies lower extremity edema or orthopnea. Denies cough fever chills dysuria hematuria frequency or urgency.  In the emergency department he's afebrile hemodynamically stable and not hypoxic   Review of Systems:  10 point review of systems complete and all systems are negative except as indicated in the history of present illness  Past Medical History  Diagnosis Date  . Diabetes mellitus with complication (HCC)   . Hypertension   . Syncope     a. 02/2015 - felt 2/2 rapid AF/AFL.  Marland Kitchen Atrial fibrillation and flutter (HCC)     a. Dx 02/2015 - placed on Xarelto; b. 09/2015 recurrence in setting of PNA.  . Ischemic cardiomyopathy     a. 09/2015 Echo: EF 35-40%.  Marland Kitchen CAD (coronary artery disease)     a. Dx 02/2015 - 2V CAD with CTO LAD, diffuse dz in PDA, OM - med rx.  . Hypertensive heart disease   . Hyperlipidemia   . Mitral regurgitation     a. Mod by echo 02/2015,  . Tobacco abuse   . Chronic systolic CHF (congestive heart failure) (HCC)     a. 09/2015 Echo: EF 35-40%, sev apical/antlat, apical HK, mild MR, mildly dil LA.  Marland Kitchen ARF (acute respiratory failure) (HCC)   . Pneumonia 09/2015  . GERD (gastroesophageal reflux disease)   . Headache   . Arthritis     ra  . NSTEMI (non-ST elevated myocardial infarction) (HCC) 10/2015   Past Surgical History  Procedure Laterality Date  . Right thumb surgery     . Cardiac catheterization N/A 02/24/2015    Procedure: Left Heart Cath and Coronary Angiography;  Surgeon: Marykay Lex, MD;  Location: [facility] INVASIVE CV LAB CUPID;  Service: Cardiovascular;  Laterality: N/A;  . Cardiac catheterization  02/24/2015    Procedure: Coronary/Bypass Graft CTO Intervention;  Surgeon: Marykay Lex, MD;  Location: [facility] INVASIVE CV LAB CUPID;  Service: Cardiovascular;;  . Cardiac catheterization N/A  11/03/2015    Procedure: Left Heart Cath and Coronary Angiography;  Surgeon: Tonny Bollman, MD;  Location: [facility] INVASIVE CV LAB;  Service: Cardiovascular;  Laterality: N/A;  . Cardiac catheterization N/A 11/03/2015    Procedure: Coronary Stent Intervention;  Surgeon: Tonny Bollman, MD;  Location: [facility] INVASIVE CV LAB;  Service: Cardiovascular;  Laterality: N/A;  RAMUS   Social History:  reports that he has been smoking Cigarettes.  He has a 3 pack-year smoking history. He has never used smokeless tobacco. He reports that he uses illicit drugs (Cocaine). He reports that he does not drink alcohol.  No Known Allergies Family History  Problem Relation Age of Onset  . Diabetes Mother   . Hypertension Mother   . Heart disease Mother   . Cancer Mother     stomach cancer   . Diabetes Father   . Hypertension Father   . Heart disease Father   . Diabetes Sister   . Hypertension Sister   . Cancer Sister     breast cancer   . Diabetes Brother   . Hypertension Brother   . Heart disease Brother   . Stroke Brother   . Heart attack Mother   . Heart attack Father   . Heart attack Brother     Prior to Admission medications   Medication Sig Start Date End Date Taking? Authorizing Provider  albuterol (PROVENTIL HFA;VENTOLIN HFA) 108 (90 Base) MCG/ACT inhaler Inhale 2 puffs into the lungs every 6 (six) hours as needed for wheezing or shortness of breath. 12/05/15  Yes Jaclyn Shaggy, MD  amiodarone (PACERONE) 200 MG tablet Take 1 tablet (200 mg total) by mouth daily. 12/01/15  Yes Maryann Mikhail, DO  amLODipine (NORVASC) 5 MG tablet Take 1 tablet (5 mg total) by mouth daily. 12/01/15  Yes Rolly Salter, MD  atorvastatin (LIPITOR) 80 MG tablet Take 1 tablet (80 mg total) by mouth every evening. 10/17/15  Yes Ripudeep Jenna Luo, MD  carvedilol (COREG) 12.5 MG tablet Take 12.5 mg by mouth 2 (two) times daily with a meal.   Yes Historical Provider, MD  clopidogrel (PLAVIX) 75 MG tablet Take 1 tablet (75 mg total) by  mouth daily with breakfast. 11/06/15  Yes Joseph Art, DO  Dexlansoprazole (DEXILANT) 30 MG capsule Take 1 capsule (30 mg total) by mouth daily. 12/30/14  Yes Josalyn Funches, MD  digoxin (LANOXIN) 0.125 MG tablet Take 0.125 mg by mouth daily.   Yes Historical Provider, MD  diltiazem (DILACOR XR) 240 MG 24 hr capsule Take 240 mg by mouth daily.   Yes Historical Provider, MD  furosemide (LASIX) 40 MG tablet Take 1 tablet (40 mg total) by mouth daily. 02/25/15  Yes Dayna N Dunn, PA-C  glipiZIDE (GLUCOTROL) 10 MG tablet Take 1 tablet (10 mg total) by mouth 2 (two) times daily before a meal. Patient taking differently: Take 5 mg by mouth daily before breakfast.  11/06/15  Yes Joseph Art, DO  hydrALAZINE (APRESOLINE) 25 MG tablet Take 2 tablets (50 mg total) by mouth 3 (three) times daily. 12/01/15  Yes Rolly Salter, MD  ipratropium-albuterol (DUONEB) 0.5-2.5 (3) MG/3ML SOLN Take 3 mLs by nebulization 2 (two) times  daily. 10/17/15  Yes Ripudeep Jenna Luo, MD  isosorbide mononitrate (IMDUR) 30 MG 24 hr tablet Take 1 tablet (30 mg total) by mouth daily. 11/11/15  Yes Jaclyn Shaggy, MD  lisinopril (PRINIVIL,ZESTRIL) 20 MG tablet Take 20 mg by mouth daily.   Yes Historical Provider, MD  nitroGLYCERIN (NITROSTAT) 0.4 MG SL tablet Place 1 tablet (0.4 mg total) under the tongue every 5 (five) minutes as needed for chest pain (up to 3 doses). 02/25/15  Yes Dayna N Dunn, PA-C  rivaroxaban (XARELTO) 20 MG TABS tablet Take 1 tablet (20 mg total) by mouth daily with supper. Patient taking differently: Take 20 mg by mouth daily.  02/25/15  Yes Dayna N Dunn, PA-C  spironolactone (ALDACTONE) 25 MG tablet Take 25 mg by mouth daily.   Yes Historical Provider, MD  traMADol (ULTRAM) 50 MG tablet Take 1 tablet (50 mg total) by mouth every 6 (six) hours as needed for severe pain. 11/25/15  Yes Jaclyn Shaggy, MD  potassium chloride SA (K-DUR,KLOR-CON) 20 MEQ tablet Take 1 tablet (20 mEq total) by mouth daily. Patient not taking:  Reported on 12/09/2015 10/17/15   Ripudeep Jenna Luo, MD   Physical Exam: Blood pressure 104/60, pulse 71, temperature 97.9 F (36.6 C), temperature source Oral, resp. rate 23, weight 74.2 kg (163 lb 9.3 oz), SpO2 93 %. Filed Vitals:   12/09/15 1040 12/09/15 1100  BP:  104/60  Pulse:  71  Temp: 97.9 F (36.6 C)   Resp:  23     General:  Calm cooperative thin  Eyes: Equal round reactive to light EOMI  ENT: Ears clear nose without drainage oropharynx without erythema or exudate  Neck: Supple no JVD  Cardiovascular: Regular rate and rhythm no murmur gallop or rub no lower extremity edema  Respiratory: Tingling with aspiration breath sounds slightly distant but good air movement no wheeze no rhonchi no crackles  Abdomen: Distended positive bowel sounds nontender  Skin: Warm and dry no lesions or rash  Musculoskeletal: Joints without swelling/erythema  Psychiatric: Number anxious cooperative  Neurologic: Oriented 3 speech clear  Labs on Admission:  Basic Metabolic Panel:  Recent Labs Lab 12/09/15 0906  NA 139  K 4.9  CL 103  CO2 24  GLUCOSE 330*  BUN 18  CREATININE 1.22  CALCIUM 9.6   Liver Function Tests: No results for input(s): AST, ALT, ALKPHOS, BILITOT, PROT, ALBUMIN in the last 168 hours. No results for input(s): LIPASE, AMYLASE in the last 168 hours. No results for input(s): AMMONIA in the last 168 hours. CBC:  Recent Labs Lab 12/09/15 0906  WBC 10.4  HGB 12.7*  HCT 39.0  MCV 86.5  PLT 261   Cardiac Enzymes: No results for input(s): CKTOTAL, CKMB, CKMBINDEX, TROPONINI in the last 168 hours. BNP: Invalid input(s): POCBNP CBG: No results for input(s): GLUCAP in the last 168 hours.  Radiological Exams on Admission: Dg Chest 2 View  12/09/2015  CLINICAL DATA:  Mid to left chest pain.  Shortness of breath today. EXAM: CHEST  2 VIEW COMPARISON:  CT 11/30/2015.  X-ray 11/30/2015 FINDINGS: Heart is upper limits normal in size. There is patchy  bilateral perihilar and lower lobe opacities, somewhat improved since prior study. No visible effusions. No acute bony abnormality. IMPRESSION: Patchy bilateral perihilar and lower lobe airspace opacities, somewhat improved since prior study. This could represent edema or infection. Electronically Signed   By: Charlett Nose M.D.   On: 12/09/2015 08:51    EKG: Independently reviewed. A. fib  Time  spent: 75 minutes  [facility]  [email or web address] Password [facility] 12/09/2015, 11:09 AM

## 2015-12-09 NOTE — ED Notes (Signed)
Patient comes from home with complaints of  intermitted Chest Pain and SOB that started Yesterday.Per EMS patient states he has had a cough since Jan and chest pain is worst with chest pain. Patient rates pain 8/10.

## 2015-12-09 NOTE — ED Provider Notes (Signed)
CSN: [id number]     Arrival date & time 12/09/15  0809 History   First MD Initiated Contact with Patient 12/09/15 [phone]     Chief Complaint  Patient presents with  . Chest Pain  . Shortness of Breath     (Consider location/radiation/quality/duration/timing/severity/associated sxs/prior Treatment) HPI Comments: 55 year old male history of high blood pressure, cocaine abuse, cardiomyopathy, recent admission for heart failure presents with shortness of breath and intermittent anterior chest discomfort nonradiating. No diaphoresis. Feels similar to his heart failure history. Mild orthopnea, no significant leg swelling or weight gain. No exertional chest pain. No blood clot history, no unilateral leg swelling no recent surgery.  Patient is a 55 y.o. male presenting with chest pain and shortness of breath. The history is provided by the patient and medical records.  Chest Pain Associated symptoms: cough and shortness of breath   Associated symptoms: no abdominal pain, no back pain, no fever, no headache and not vomiting   Shortness of Breath Associated symptoms: chest pain and cough   Associated symptoms: no abdominal pain, no fever, no headaches, no neck pain, no rash and no vomiting     Past Medical History  Diagnosis Date  . Diabetes mellitus with complication (HCC)   . Hypertension   . Syncope     a. 02/2015 - felt 2/2 rapid AF/AFL.  Marland Kitchen Atrial fibrillation and flutter (HCC)     a. Dx 02/2015 - placed on Xarelto; b. 09/2015 recurrence in setting of PNA.  . Ischemic cardiomyopathy     a. 09/2015 Echo: EF 35-40%.  Marland Kitchen CAD (coronary artery disease)     a. Dx 02/2015 - 2V CAD with CTO LAD, diffuse dz in PDA, OM - med rx.  . Hypertensive heart disease   . Hyperlipidemia   . Mitral regurgitation     a. Mod by echo 02/2015,  . Tobacco abuse   . Chronic systolic CHF (congestive heart failure) (HCC)     a. 09/2015 Echo: EF 35-40%, sev apical/antlat, apical HK, mild MR, mildly dil LA.  Marland Kitchen ARF  (acute respiratory failure) (HCC)   . Pneumonia 09/2015  . GERD (gastroesophageal reflux disease)   . Headache   . Arthritis     ra  . NSTEMI (non-ST elevated myocardial infarction) (HCC) 10/2015   Past Surgical History  Procedure Laterality Date  . Right thumb surgery     . Cardiac catheterization N/A 02/24/2015    Procedure: Left Heart Cath and Coronary Angiography;  Surgeon: Marykay Lex, MD;  Location: [facility] INVASIVE CV LAB CUPID;  Service: Cardiovascular;  Laterality: N/A;  . Cardiac catheterization  02/24/2015    Procedure: Coronary/Bypass Graft CTO Intervention;  Surgeon: Marykay Lex, MD;  Location: [facility] INVASIVE CV LAB CUPID;  Service: Cardiovascular;;  . Cardiac catheterization N/A 11/03/2015    Procedure: Left Heart Cath and Coronary Angiography;  Surgeon: Tonny Bollman, MD;  Location: [facility] INVASIVE CV LAB;  Service: Cardiovascular;  Laterality: N/A;  . Cardiac catheterization N/A 11/03/2015    Procedure: Coronary Stent Intervention;  Surgeon: Tonny Bollman, MD;  Location: [facility] INVASIVE CV LAB;  Service: Cardiovascular;  Laterality: N/A;  RAMUS   Family History  Problem Relation Age of Onset  . Diabetes Mother   . Hypertension Mother   . Heart disease Mother   . Cancer Mother     stomach cancer   . Diabetes Father   . Hypertension Father   . Heart disease Father   . Diabetes Sister   . Hypertension Sister   .  Cancer Sister     breast cancer   . Diabetes Brother   . Hypertension Brother   . Heart disease Brother   . Stroke Brother   . Heart attack Mother   . Heart attack Father   . Heart attack Brother    Social History  Substance Use Topics  . Smoking status: Current Every Day Smoker -- 0.10 packs/day for 30 years    Types: Cigarettes  . Smokeless tobacco: Never Used  . Alcohol Use: No    Review of Systems  Constitutional: Negative for fever and chills.  HENT: Negative for congestion.   Eyes: Negative for visual disturbance.  Respiratory: Positive for cough and  shortness of breath.   Cardiovascular: Positive for chest pain.  Gastrointestinal: Negative for vomiting and abdominal pain.  Genitourinary: Negative for dysuria and flank pain.  Musculoskeletal: Negative for back pain, neck pain and neck stiffness.  Skin: Negative for rash.  Neurological: Negative for light-headedness and headaches.      Allergies  Review of patient's allergies indicates no known allergies.  Home Medications   Prior to Admission medications   Medication Sig Start Date End Date Taking? Authorizing Provider  albuterol (PROVENTIL HFA;VENTOLIN HFA) 108 (90 Base) MCG/ACT inhaler Inhale 2 puffs into the lungs every 6 (six) hours as needed for wheezing or shortness of breath. 12/05/15  Yes Jaclyn Shaggy, MD  amiodarone (PACERONE) 200 MG tablet Take 1 tablet (200 mg total) by mouth daily. 12/01/15  Yes Maryann Mikhail, DO  amLODipine (NORVASC) 5 MG tablet Take 1 tablet (5 mg total) by mouth daily. 12/01/15  Yes Rolly Salter, MD  atorvastatin (LIPITOR) 80 MG tablet Take 1 tablet (80 mg total) by mouth every evening. 10/17/15  Yes Ripudeep Jenna Luo, MD  carvedilol (COREG) 12.5 MG tablet Take 12.5 mg by mouth 2 (two) times daily with a meal.   Yes Historical Provider, MD  clopidogrel (PLAVIX) 75 MG tablet Take 1 tablet (75 mg total) by mouth daily with breakfast. 11/06/15  Yes Joseph Art, DO  Dexlansoprazole (DEXILANT) 30 MG capsule Take 1 capsule (30 mg total) by mouth daily. 12/30/14  Yes Josalyn Funches, MD  digoxin (LANOXIN) 0.125 MG tablet Take 0.125 mg by mouth daily.   Yes Historical Provider, MD  diltiazem (DILACOR XR) 240 MG 24 hr capsule Take 240 mg by mouth daily.   Yes Historical Provider, MD  furosemide (LASIX) 40 MG tablet Take 1 tablet (40 mg total) by mouth daily. 02/25/15  Yes Dayna N Dunn, PA-C  glipiZIDE (GLUCOTROL) 10 MG tablet Take 1 tablet (10 mg total) by mouth 2 (two) times daily before a meal. Patient taking differently: Take 5 mg by mouth daily before breakfast.   11/06/15  Yes Joseph Art, DO  hydrALAZINE (APRESOLINE) 25 MG tablet Take 2 tablets (50 mg total) by mouth 3 (three) times daily. 12/01/15  Yes Rolly Salter, MD  ipratropium-albuterol (DUONEB) 0.5-2.5 (3) MG/3ML SOLN Take 3 mLs by nebulization 2 (two) times daily. 10/17/15  Yes Ripudeep Jenna Luo, MD  isosorbide mononitrate (IMDUR) 30 MG 24 hr tablet Take 1 tablet (30 mg total) by mouth daily. 11/11/15  Yes Jaclyn Shaggy, MD  lisinopril (PRINIVIL,ZESTRIL) 20 MG tablet Take 20 mg by mouth daily.   Yes Historical Provider, MD  nitroGLYCERIN (NITROSTAT) 0.4 MG SL tablet Place 1 tablet (0.4 mg total) under the tongue every 5 (five) minutes as needed for chest pain (up to 3 doses). 02/25/15  Yes Laurann Montana, PA-C  rivaroxaban (XARELTO) 20 MG TABS tablet Take 1 tablet (20 mg total) by mouth daily with supper. Patient taking differently: Take 20 mg by mouth daily.  02/25/15  Yes Dayna N Dunn, PA-C  spironolactone (ALDACTONE) 25 MG tablet Take 25 mg by mouth daily.   Yes Historical Provider, MD  traMADol (ULTRAM) 50 MG tablet Take 1 tablet (50 mg total) by mouth every 6 (six) hours as needed for severe pain. 11/25/15  Yes Jaclyn Shaggy, MD  potassium chloride SA (K-DUR,KLOR-CON) 20 MEQ tablet Take 1 tablet (20 mEq total) by mouth daily. Patient not taking: Reported on 12/09/2015 10/17/15   Ripudeep K Rai, MD   BP 110/78 mmHg  Pulse 93  Temp(Src) 97.9 F (36.6 C) (Oral)  Resp 29  Wt 163 lb 9.3 oz (74.2 kg)  SpO2 93% Physical Exam  Constitutional: He is oriented to person, place, and time. He appears well-developed and well-nourished.  HENT:  Head: Normocephalic and atraumatic.  Eyes: Conjunctivae are normal. Right eye exhibits no discharge. Left eye exhibits no discharge.  Neck: Normal range of motion. Neck supple. No tracheal deviation present.  Cardiovascular: Normal rate and regular rhythm.   Pulmonary/Chest: Effort normal. He has rales (sparse crackles).  Abdominal: Soft. He exhibits no distension.  There is no tenderness. There is no guarding.  Musculoskeletal: He exhibits no edema.  Neurological: He is alert and oriented to person, place, and time.  Skin: Skin is warm. No rash noted.  Psychiatric: He has a normal mood and affect.  Nursing note and vitals reviewed.   ED Course  Procedures (including critical care time) Labs Review Labs Reviewed  BASIC METABOLIC PANEL - Abnormal; Notable for the following:    Glucose, Bld 330 (*)    All other components within normal limits  CBC - Abnormal; Notable for the following:    Hemoglobin 12.7 (*)    All other components within normal limits  PROTIME-INR - Abnormal; Notable for the following:    Prothrombin Time 17.0 (*)    All other components within normal limits  BRAIN NATRIURETIC PEPTIDE - Abnormal; Notable for the following:    B Natriuretic Peptide 795.1 (*)    All other components within normal limits  URINE RAPID DRUG SCREEN, HOSP PERFORMED  I-STAT TROPOININ, ED  I-STAT TROPOININ, ED  Rosezena Sensor, ED    Imaging Review Dg Chest 2 View  12/09/2015  CLINICAL DATA:  Mid to left chest pain.  Shortness of breath today. EXAM: CHEST  2 VIEW COMPARISON:  CT 11/30/2015.  X-ray 11/30/2015 FINDINGS: Heart is upper limits normal in size. There is patchy bilateral perihilar and lower lobe opacities, somewhat improved since prior study. No visible effusions. No acute bony abnormality. IMPRESSION: Patchy bilateral perihilar and lower lobe airspace opacities, somewhat improved since prior study. This could represent edema or infection. Electronically Signed   By: Charlett Nose M.D.   On: 12/09/2015 08:51   I have personally reviewed and evaluated these images and lab results as part of my medical decision-making.   EKG Interpretation   Date/Time:  Tuesday December 09 2015 08:15:20 EST Ventricular Rate:  67 PR Interval:  154 QRS Duration: 90 QT Interval:  427 QTC Calculation: 451 R Axis:   65 Text Interpretation:  Sinus rhythm  Probable left atrial enlargement  Anterior infarct, old Baseline wander in lead(s) II III aVF Similar to  previous Confirmed by Rainy Rothman  MD, Sheyna Pettibone ([id number]) on 12/09/2015 8:18:58 AM      MDM   Final diagnoses:  Acute dyspnea  Acute on chronic systolic congestive heart failure [facility])   Patient presents with clinical concern for mild heart failure. Chest x-ray similar/improved to previous. Troponin negative. EKG similar to previous. Hospitalist evaluate in the ER recommends close outpatient follow-up which is reasonable considering recent mission not requiring oxygen and vitals normal.  Results and differential diagnosis were discussed with the patient/parent/guardian. Xrays were independently reviewed by myself.  Close follow up outpatient was discussed, comfortable with the plan.   Medications  nitroGLYCERIN (NITROSTAT) SL tablet 0.4 mg (not administered)  aspirin chewable tablet 324 mg (324 mg Oral Given 12/09/15 1021)  furosemide (LASIX) injection 40 mg (40 mg Intravenous Given 12/09/15 1021)  nitroGLYCERIN (NITROGLYN) 2 % ointment 1 inch (1 inch Topical Given 12/09/15 1022)  LORazepam (ATIVAN) tablet 0.5 mg (0.5 mg Oral Given 12/09/15 1039)    Filed Vitals:   12/09/15 1030 12/09/15 1040 12/09/15 1047 12/09/15 1100  BP: 110/78   104/60  Pulse: 93   71  Temp:  97.9 F (36.6 C)    TempSrc:  Oral    Resp: 29   23  Weight:   163 lb 9.3 oz (74.2 kg)   SpO2: 93%   93%    Final diagnoses:  Acute dyspnea  Acute on chronic systolic congestive heart failure (HCC)       Blane Ohara, MD 12/09/15 1110

## 2015-12-09 NOTE — ED Notes (Signed)
Admitting at bedside 

## 2015-12-09 NOTE — Discharge Instructions (Signed)
If you were given medicines take as directed.  If you are on coumadin or contraceptives realize their levels and effectiveness is altered by many different medicines.  If you have any reaction (rash, tongues swelling, other) to the medicines stop taking and see a physician.    If your blood pressure was elevated in the ER make sure you follow up for management with a primary doctor or return for chest pain, shortness of breath or stroke symptoms.  Please follow up as directed and return to the ER or see a physician for new or worsening symptoms.  Thank you. Filed Vitals:   12/09/15 0938 12/09/15 1030 12/09/15 1040 12/09/15 1047  BP: 103/66 110/78    Pulse: 71 93    Temp:   97.9 F (36.6 C)   TempSrc:   Oral   Resp: 32 29    Weight:    163 lb 9.3 oz (74.2 kg)  SpO2: 91% 93%

## 2015-12-09 NOTE — Telephone Encounter (Signed)
Call received from the patient informing this CM that he is currently in the ED because of difficulty breathing and will need to reschedule his appointment with Dr Venetia Night.  It was scheduled for today at 1200 and has now been  rescheduled to 12/16/15 @ 1200. The appointment information was placed on the AVS.

## 2015-12-12 ENCOUNTER — Telehealth: Payer: Self-pay

## 2015-12-12 ENCOUNTER — Encounter: Payer: Self-pay | Admitting: Adult Health

## 2015-12-12 ENCOUNTER — Ambulatory Visit (INDEPENDENT_AMBULATORY_CARE_PROVIDER_SITE_OTHER): Payer: Self-pay | Admitting: Adult Health

## 2015-12-12 VITALS — BP 112/76 | HR 86 | Temp 97.9°F | Ht 72.0 in | Wt 163.0 lb

## 2015-12-12 DIAGNOSIS — Z720 Tobacco use: Secondary | ICD-10-CM

## 2015-12-12 DIAGNOSIS — F17200 Nicotine dependence, unspecified, uncomplicated: Secondary | ICD-10-CM

## 2015-12-12 DIAGNOSIS — J189 Pneumonia, unspecified organism: Secondary | ICD-10-CM

## 2015-12-12 DIAGNOSIS — R059 Cough, unspecified: Secondary | ICD-10-CM

## 2015-12-12 DIAGNOSIS — I5022 Chronic systolic (congestive) heart failure: Secondary | ICD-10-CM

## 2015-12-12 DIAGNOSIS — R05 Cough: Secondary | ICD-10-CM

## 2015-12-12 NOTE — Patient Instructions (Signed)
Mucinex DM Twice daily  As needed  Cough/congestion  Zyrtec 10mg  At bedtime  As needed  Drainage .  Follow up with Dr. in 1 month with PFT  Please contact office for sooner follow up if symptoms do not improve or worsen or seek emergency care

## 2015-12-12 NOTE — Assessment & Plan Note (Signed)
Heavy smoker, check PFT on return

## 2015-12-12 NOTE — Assessment & Plan Note (Signed)
Appears compensated without evidence of fluid overload  Cocaine cessation encouarged  follow up with cards as planned

## 2015-12-12 NOTE — Telephone Encounter (Signed)
This Case Manager received return call from patient. Inquired about patient's status; he indicated he was doing well and had no complaints. Reminded patient of upcoming appointment on 12/16/15 at 1200 with Dr. Venetia Night. Patient aware of appointment. No transportation barriers identified. Also, instructed patient to keep blood track of his blood sugars and bring blood glucose log to his upcoming appointment. Patient verbalized understanding. No additional needs/concerns identified.

## 2015-12-12 NOTE — Progress Notes (Signed)
Subjective:    Patient ID: Sean Hinton, male    DOB: 10/09/1961, 55 y.o.   MRN: [id number]  HPI  55 yo male smoker  with Atrial Fib and Systolic CHF with severe mitral regurg. With ongoing cocaine use. Seen for pulmonary consult 11/30/15 for acute resp failure due to decompensated CHF   12/12/2015 Post hospital follow up  Pt returns for a post hospital follow up .  Admitted 11/30/15 for acute CHF with pulmonary edema  with underlying cocaine use. He had aspiration PNA , he was treated with IV abx, initial BIPAP support , diuresis .  CTA chest showed CHF patten, no PE.  Recent echo shows 40-45%, LA  Severe dilation, MV severe regurg, PAP .  He says he is feeling some better but still gets very sob .  Went back to ER 12/09/15  for sob , CXR showed improved bilateral opacities.  tox screen positive again for cocaine  We discussed cessation and dangers of ongoing use. Looking into counseling.  He denies chest pain, hemoptysis, fever, discolored mucus.  Does have drainage in throat.  Has smoked 1/2 ppd x 30 yr.    Past Medical History  Diagnosis Date  . Diabetes mellitus with complication (HCC)   . Hypertension   . Syncope     a. 02/2015 - felt 2/2 rapid AF/AFL.  Marland Kitchen Atrial fibrillation and flutter (HCC)     a. Dx 02/2015 - placed on Xarelto; b. 09/2015 recurrence in setting of PNA.  . Ischemic cardiomyopathy     a. 09/2015 Echo: EF 35-40%.  Marland Kitchen CAD (coronary artery disease)     a. Dx 02/2015 - 2V CAD with CTO LAD, diffuse dz in PDA, OM - med rx.  . Hypertensive heart disease   . Hyperlipidemia   . Mitral regurgitation     a. Mod by echo 02/2015,  . Tobacco abuse   . Chronic systolic CHF (congestive heart failure) (HCC)     a. 09/2015 Echo: EF 35-40%, sev apical/antlat, apical HK, mild MR, mildly dil LA.  Marland Kitchen ARF (acute respiratory failure) (HCC)   . Pneumonia 09/2015  . GERD (gastroesophageal reflux disease)   . Headache   . Arthritis     ra  . NSTEMI (non-ST elevated myocardial  infarction) (HCC) 10/2015      Review of Systems  Constitutional:   No  weight loss, night sweats,  Fevers, chills, + fatigue, or  lassitude.  HEENT:   No headaches,  Difficulty swallowing,  Tooth/dental problems, or  Sore throat,                No sneezing, itching, ear ache, nasal congestion, post nasal drip,   CV:  No chest pain,  Orthopnea, PND, swelling in lower extremities, anasarca, dizziness, palpitations, syncope.   GI  No heartburn, indigestion, abdominal pain, nausea, vomiting, diarrhea, change in bowel habits, loss of appetite, bloody stools.   Resp: .  No chest wall deformity  Skin: no rash or lesions.  GU: no dysuria, change in color of urine, no urgency or frequency.  No flank pain, no hematuria   MS:  No joint pain or swelling.  No decreased range of motion.  No back pain.  Psych:  No change in mood or affect. No depression or anxiety.  No memory loss.         Objective:   Physical Exam Filed Vitals:   12/12/15 1604  BP: 112/76  Pulse: 86  Temp: 97.9 F (36.6 C)  TempSrc: Oral  Height: 6' (1.829 m)  Weight: 163 lb (73.936 kg)  SpO2: 96%   GEN: A/Ox3; pleasant , NAD   HEENT:  [place]/AT,  EACs-clear, TMs-wnl, NOSE-clear, THROAT-clear, no lesions, no postnasal drip or exudate noted.   NECK:  Supple w/ fair ROM; no JVD; normal carotid impulses w/o bruits; no thyromegaly or nodules palpated; no lymphadenopathy.  RESP  Clear  P & A; w/o, wheezes/ rales/ or rhonchi.no accessory muscle use, no dullness to percussion  CARD:  RRR, 1-2/ SM  , no peripheral edema, pulses intact, no cyanosis or clubbing.  GI:   Soft & nt; nml bowel sounds; no organomegaly or masses detected.  Musco: Warm bil, no deformities or joint swelling noted.   Neuro: alert, no focal deficits noted.    Skin: Warm, no lesions or rashes         Assessment & Plan:

## 2015-12-12 NOTE — Telephone Encounter (Signed)
This Case Manager placed call to patient to check on status and to remind him of upcoming appointment on 12/16/15 at 1200 with Dr. Venetia Night.  Unable to reach patient at [phone]; voicemail left requesting return call.

## 2015-12-12 NOTE — Assessment & Plan Note (Signed)
Recent PNA ? Aspiration pneumonitis w/ cocaine use and acute CHF  cxr is improving  Will need to follow up cxr on return in 3 to 4 weeks.

## 2015-12-15 ENCOUNTER — Telehealth: Payer: Self-pay

## 2015-12-15 NOTE — Telephone Encounter (Signed)
Attempted to contact the patient to confirm his appointment for tomorrow, 12/16/15 @ 1200. Calls placed to # [phone] (H) and #  [phone] (M) and HIPAA compliant voice mail messages were left requesting a call back to # [phone] or [phone].

## 2015-12-16 ENCOUNTER — Ambulatory Visit: Payer: Self-pay | Attending: Family Medicine | Admitting: Family Medicine

## 2015-12-16 ENCOUNTER — Encounter: Payer: Self-pay | Admitting: Family Medicine

## 2015-12-16 VITALS — BP 112/77 | HR 96 | Temp 98.0°F | Resp 16 | Ht 72.0 in | Wt 162.0 lb

## 2015-12-16 DIAGNOSIS — I5022 Chronic systolic (congestive) heart failure: Secondary | ICD-10-CM

## 2015-12-16 DIAGNOSIS — H547 Unspecified visual loss: Secondary | ICD-10-CM

## 2015-12-16 DIAGNOSIS — F1410 Cocaine abuse, uncomplicated: Secondary | ICD-10-CM | POA: Insufficient documentation

## 2015-12-16 DIAGNOSIS — I25118 Atherosclerotic heart disease of native coronary artery with other forms of angina pectoris: Secondary | ICD-10-CM

## 2015-12-16 DIAGNOSIS — Z79899 Other long term (current) drug therapy: Secondary | ICD-10-CM | POA: Insufficient documentation

## 2015-12-16 DIAGNOSIS — I10 Essential (primary) hypertension: Secondary | ICD-10-CM

## 2015-12-16 DIAGNOSIS — I5023 Acute on chronic systolic (congestive) heart failure: Secondary | ICD-10-CM

## 2015-12-16 DIAGNOSIS — I4892 Unspecified atrial flutter: Secondary | ICD-10-CM

## 2015-12-16 DIAGNOSIS — E139 Other specified diabetes mellitus without complications: Secondary | ICD-10-CM

## 2015-12-16 DIAGNOSIS — F17210 Nicotine dependence, cigarettes, uncomplicated: Secondary | ICD-10-CM | POA: Insufficient documentation

## 2015-12-16 DIAGNOSIS — I2510 Atherosclerotic heart disease of native coronary artery without angina pectoris: Secondary | ICD-10-CM | POA: Insufficient documentation

## 2015-12-16 DIAGNOSIS — I5043 Acute on chronic combined systolic (congestive) and diastolic (congestive) heart failure: Secondary | ICD-10-CM | POA: Insufficient documentation

## 2015-12-16 DIAGNOSIS — R0600 Dyspnea, unspecified: Secondary | ICD-10-CM | POA: Insufficient documentation

## 2015-12-16 DIAGNOSIS — Z7984 Long term (current) use of oral hypoglycemic drugs: Secondary | ICD-10-CM | POA: Insufficient documentation

## 2015-12-16 DIAGNOSIS — M1990 Unspecified osteoarthritis, unspecified site: Secondary | ICD-10-CM | POA: Insufficient documentation

## 2015-12-16 DIAGNOSIS — E119 Type 2 diabetes mellitus without complications: Secondary | ICD-10-CM | POA: Insufficient documentation

## 2015-12-16 DIAGNOSIS — I252 Old myocardial infarction: Secondary | ICD-10-CM | POA: Insufficient documentation

## 2015-12-16 DIAGNOSIS — K219 Gastro-esophageal reflux disease without esophagitis: Secondary | ICD-10-CM | POA: Insufficient documentation

## 2015-12-16 DIAGNOSIS — E785 Hyperlipidemia, unspecified: Secondary | ICD-10-CM | POA: Insufficient documentation

## 2015-12-16 DIAGNOSIS — I255 Ischemic cardiomyopathy: Secondary | ICD-10-CM | POA: Insufficient documentation

## 2015-12-16 DIAGNOSIS — H5340 Unspecified visual field defects: Secondary | ICD-10-CM | POA: Insufficient documentation

## 2015-12-16 LAB — BRAIN NATRIURETIC PEPTIDE: Brain Natriuretic Peptide: 697.5 pg/mL — ABNORMAL HIGH (ref <100)

## 2015-12-16 LAB — GLUCOSE, POCT (MANUAL RESULT ENTRY): POC Glucose: 365 mg/dl — AB (ref 70–99)

## 2015-12-16 MED ORDER — FUROSEMIDE 40 MG PO TABS: 40.0000 mg | ORAL_TABLET | Freq: Two times a day (BID) | ORAL | Status: DC

## 2015-12-16 MED FILL — FUROSEMIDE 40 MG TABLET: 40 | 30 days supply | Qty: 60 | Fill #0

## 2015-12-16 NOTE — Progress Notes (Signed)
Patient's here for f/up DM2 and ED f/up for acute dypsena and chronic congestive heart failure, denies chest pain.   Patient c/o some issues with breath from the cough.   Patient didn't get a chance to take his meds this morning.  Patient asking about steroid inhaler and solution for breathing machine.

## 2015-12-16 NOTE — Progress Notes (Signed)
[facility]  Date of telephone encounter: 11/23/15  Hospitalization dates: 11/19/15 through 11/23/15 and 11/30/15 through 12/01/15  PCP: Dr Armen Pickup  CC: Follow-up at the transitional care clinic.  HPI: Sean Hinton is a 55 y.o. male is a 55 year old male with a history of systolic heart failure (EF 30-35%), atrial fibrillation (on anticoagulation with Xarelto), coronary artery disease, hypertension, diabetes mellitus (A1c 9.6), tobacco abuse, cocaine abuse who comes in to the transitional care clinic for follow-up visit.  He had a hospitalization for acute hypercapnic respiratory failure in 10/2015 and again on 11/30/15 for acute on chronic combined systolic and diastolic congestive heart failure requiring BiPAP with 80% oxygen which was later transitioned to 2 L of oxygen via nasal cannula and then room air. Exacerbation was thought to be secondary to recurrent cocaine use with accelerated hypertension and he was also on IV Lasix every 6 hours.. EKG was negative for acute ischemia and he was subsequently discharged with recommendations to follow-up with pulmonary.  He was [facility] by [facility] on 12/[facility]/15 and by [facility] and is scheduled for a pulmonary function test early next month. He continues to have bilateral visual field loss and had informed me at his last office visit that he missed his appointment with ophthalmology and was supposed to reschedule however he denies saying that today and states that he was needing a referral. He complains of dyspnea today and orthopnea but denies pedal edema or weight gain.  Marland KitchenNo Known Allergies Past Medical History  Diagnosis Date  . Diabetes mellitus with complication (HCC)   . Hypertension   . Syncope     a. 02/2015 - felt 2/2 rapid AF/AFL.  Marland Kitchen Atrial fibrillation and flutter (HCC)     a. Dx 02/2015 - placed on Xarelto; b. 09/2015 recurrence in setting of PNA.  . Ischemic cardiomyopathy     a. 09/2015 Echo: EF 35-40%.  Marland Kitchen CAD  (coronary artery disease)     a. Dx 02/2015 - 2V CAD with CTO LAD, diffuse dz in PDA, OM - med rx.  . Hypertensive heart disease   . Hyperlipidemia   . Mitral regurgitation     a. Mod by echo 02/2015,  . Tobacco abuse   . Chronic systolic CHF (congestive heart failure) (HCC)     a. 09/2015 Echo: EF 35-40%, sev apical/antlat, apical HK, mild MR, mildly dil LA.  Marland Kitchen ARF (acute respiratory failure) (HCC)   . Pneumonia 09/2015  . GERD (gastroesophageal reflux disease)   . Headache   . Arthritis     ra  . NSTEMI (non-ST elevated myocardial infarction) (HCC) 10/2015   Current Outpatient Prescriptions on File Prior to Visit  Medication Sig Dispense Refill  . albuterol (PROVENTIL HFA;VENTOLIN HFA) 108 (90 Base) MCG/ACT inhaler Inhale 2 puffs into the lungs every 6 (six) hours as needed for wheezing or shortness of breath. 1 Inhaler 2  . amiodarone (PACERONE) 200 MG tablet Take 1 tablet (200 mg total) by mouth daily. 30 tablet 0  . amLODipine (NORVASC) 5 MG tablet Take 1 tablet (5 mg total) by mouth daily. 30 tablet 0  . atorvastatin (LIPITOR) 80 MG tablet Take 1 tablet (80 mg total) by mouth every evening. 30 tablet 3  . carvedilol (COREG) 12.5 MG tablet Take 12.5 mg by mouth 2 (two) times daily with a meal.    . clopidogrel (PLAVIX) 75 MG tablet Take 1 tablet (75 mg total) by mouth daily with breakfast. 30 tablet 0  . Dexlansoprazole (  DEXILANT) 30 MG capsule Take 1 capsule (30 mg total) by mouth daily. 90 capsule 3  . digoxin (LANOXIN) 0.125 MG tablet Take 0.125 mg by mouth daily.    Marland Kitchen diltiazem (DILACOR XR) 240 MG 24 hr capsule Take 240 mg by mouth daily.    . furosemide (LASIX) 40 MG tablet Take 1 tablet (40 mg total) by mouth daily. 30 tablet 3  . glipiZIDE (GLUCOTROL) 10 MG tablet Take 1 tablet (10 mg total) by mouth 2 (two) times daily before a meal. (Patient taking differently: Take 5 mg by mouth daily before breakfast. ) 60 tablet 0  . hydrALAZINE (APRESOLINE) 25 MG tablet Take 2 tablets  (50 mg total) by mouth 3 (three) times daily. 90 tablet 0  . ipratropium-albuterol (DUONEB) 0.5-2.5 (3) MG/3ML SOLN Take 3 mLs by nebulization 2 (two) times daily. 360 mL 10  . isosorbide mononitrate (IMDUR) 30 MG 24 hr tablet Take 1 tablet (30 mg total) by mouth daily. 30 tablet 2  . lisinopril (PRINIVIL,ZESTRIL) 20 MG tablet Take 20 mg by mouth daily.    . nitroGLYCERIN (NITROSTAT) 0.4 MG SL tablet Place 1 tablet (0.4 mg total) under the tongue every 5 (five) minutes as needed for chest pain (up to 3 doses). 25 tablet 3  . potassium chloride SA (K-DUR,KLOR-CON) 20 MEQ tablet Take 1 tablet (20 mEq total) by mouth daily. 30 tablet 3  . rivaroxaban (XARELTO) 20 MG TABS tablet Take 1 tablet (20 mg total) by mouth daily with supper. (Patient taking differently: Take 20 mg by mouth daily. ) 30 tablet 3  . spironolactone (ALDACTONE) 25 MG tablet Take 25 mg by mouth daily.    . traMADol (ULTRAM) 50 MG tablet Take 1 tablet (50 mg total) by mouth every 6 (six) hours as needed for severe pain. 30 tablet 0   No current facility-administered medications on file prior to visit.   Family History  Problem Relation Age of Onset  . Diabetes Mother   . Hypertension Mother   . Heart disease Mother   . Cancer Mother     stomach cancer   . Diabetes Father   . Hypertension Father   . Heart disease Father   . Diabetes Sister   . Hypertension Sister   . Cancer Sister     breast cancer   . Diabetes Brother   . Hypertension Brother   . Heart disease Brother   . Stroke Brother   . Heart attack Mother   . Heart attack Father   . Heart attack Brother    Social History   Social History  . Marital Status: Single    Spouse Name: N/A  . Number of Children: N/A  . Years of Education: N/A   Occupational History  . Not on file.   Social History Main Topics  . Smoking status: Current Every Day Smoker -- 0.10 packs/day for 30 years    Types: Cigarettes  . Smokeless tobacco: Never Used  . Alcohol Use: No    . Drug Use: Yes    Special: Cocaine     Comment: "last use 1991", Positive UDS 10/2015  . Sexual Activity: Not on file   Other Topics Concern  . Not on file   Social History Narrative    Review of Systems: Constitutional: Negative for fever, chills, diaphoresis, activity change, appetite change and fatigue. HENT: Negative for ear pain, nosebleeds, congestion, facial swelling, rhinorrhea, neck pain, neck stiffness and ear discharge.  Eyes: See history of present illness  Respiratory: Negative for cough, choking, chest tightness, positive for shortness of breath, negative for wheezing and stridor.  Cardiovascular: Negative for chest pain, palpitations and leg swelling. Gastrointestinal: Negative for abdominal distention. Genitourinary: Negative for dysuria, urgency, frequency, hematuria, flank pain, decreased urine volume, difficulty urinating and dyspareunia.  Musculoskeletal: Negative for back pain, joint swelling, arthralgias and gait problem. Neurological: Negative for dizziness, tremors, seizures, syncope, facial asymmetry, speech difficulty, weakness, light-headedness, numbness and headaches.  Hematological: Negative for adenopathy. Does not bruise/bleed easily. Psychiatric/Behavioral: Negative for hallucinations, behavioral problems, confusion, dysphoric mood, decreased concentration and agitation.    Objective:   Filed Vitals:   12/16/15 1024  BP: 112/77  Pulse: 96  Temp: 98 F (36.7 C)  Resp: 16   Wt Readings from Last 3 Encounters:  12/16/15 162 lb (73.483 kg)  12/12/15 163 lb (73.936 kg)  12/09/15 163 lb 9.3 oz (74.2 kg)    Physical Exam: Constitutional: Patient appears well-developed and well-nourished. No distress. HENT: Normocephalic, atraumatic, External right and left ear normal. Oropharynx is clear and moist.  Eyes: Conjunctivae and EOM are normal. PERRLA, no scleral icterus. Neck: Normal ROM. Mildly elevated JVD CVS: RRR, S1/S2 +, no murmurs, no gallops,  no carotid bruit.  Pulmonary: Effort and breath sounds normal, no stridor, rhonchi, wheezes, rales.  Abdominal: Soft. BS +,  no distension, tenderness, rebound or guarding.  Musculoskeletal: Normal range of motion. No edema and no tenderness.  Lymphadenopathy: No lymphadenopathy noted, cervical, inguinal or axillary Neuro: Alert. Normal reflexes, muscle tone coordination. No cranial nerve deficit. Skin: Skin is warm and dry. No rash noted. Not diaphoretic. No erythema. No pallor. Psychiatric: Normal mood and affect. Behavior, judgment, thought content normal.  Lab Results  Component Value Date   WBC 10.4 12/09/2015   HGB 12.7* 12/09/2015   HCT 39.0 12/09/2015   MCV 86.5 12/09/2015   PLT 261 12/09/2015   Lab Results  Component Value Date   CREATININE 1.22 12/09/2015   BUN 18 12/09/2015   NA 139 12/09/2015   K 4.9 12/09/2015   CL 103 12/09/2015   CO2 24 12/09/2015    Lab Results  Component Value Date   HGBA1C 9.9* 11/19/2015   Lipid Panel     Component Value Date/Time   CHOL 128 10/13/2015 0726   TRIG 69 11/19/2015 1356   HDL 35* 10/13/2015 0726   CHOLHDL 3.7 10/13/2015 0726   VLDL 13 10/13/2015 0726   LDLCALC 80 10/13/2015 0726       Assessment and plan:  1. Diabetes mellitus without complication (HCC) A1c 9.9 from 10/2014, CBG is 365 - forgot to take diabetic medications today  Compliance emphasized Diabetic diet, lifestyle modifications - Glucose (CBG)  2. Essential hypertension Controlled  3. Atrial flutter with rapid ventricular response (HCC) Currently on amiodarone   anticoagulation with Xarelto  4. Coronary artery disease involving native coronary artery of native heart with other form of angina pectoris (HCC) Stable  5. Acute on chronic systolic CHF (congestive heart failure) (HCC) EF of 40-45%, mildly to moderately reduced systolic function from 2-D echo 10/2015 Current dyspnea and mildly elevated JVD could be secondary to fluid overload even though  his weight has been stable in the last week Advised to increase Lasix to 40 mg twice daily and I will send off a BNP  6. Dyspnea Oxygen saturation is 98%. Increasing the dose of Lasix at this time Advised to keep appointment for PFT as he may need an inhaled steroid but I would defer this to  pulmonary  7. Cocaine abuse Patient counseled on cessation  8. Visual field loss He missed his appointment with ophthalmology and had been advised to call them to reschedule Available resources for uninsured are through [facility] which I have informed him about        Jaclyn Shaggy, MD. [facility] [phone] 12/16/2015, 10:41 AM

## 2015-12-17 ENCOUNTER — Telehealth: Payer: Self-pay | Admitting: Cardiovascular Disease

## 2015-12-17 NOTE — Telephone Encounter (Signed)
Please make sure he has been compliant with Plavix and xarelto

## 2015-12-17 NOTE — Telephone Encounter (Signed)
New message   Pt states his eye doctor said he had a mild stroke   Pt is on the way to the [facility] ED

## 2015-12-17 NOTE — Telephone Encounter (Signed)
Acknowledged. Call to patient went unanswered.

## 2015-12-19 NOTE — Telephone Encounter (Signed)
2nd attempt - call goes unanswered.

## 2015-12-23 ENCOUNTER — Telehealth: Payer: Self-pay

## 2015-12-23 NOTE — Telephone Encounter (Signed)
Call placed to the patient to check on his status and to discuss scheduling a follow up visit with Dr Armen Pickup. He said he has an appointment with pulmonary on 01/06/16 and a sleep study scheduled for 01/25/16. An appointment was scheduled with Dr Armen Pickup for 01/05/16 and he was appreciative of the appointment. He said that he is out of his aldactone having taken his last dose this morning.  He noted that he is not sure if he is out of any other medications. He said he is taking all of his medications as ordered.  He said that he has seen the ophthalmologist at [facility]. He noted that his peripheral vision is getting "darker" and he discussed this with the ophthalmologist but would like to discuss it further with Dr Armen Pickup.   No other problems/questions reported.  He was appreciative of the call.  In basket message sent to Dr Venetia Night regarding the request for aldactone.

## 2015-12-24 ENCOUNTER — Other Ambulatory Visit: Payer: Self-pay | Admitting: Family Medicine

## 2015-12-24 ENCOUNTER — Telehealth: Payer: Self-pay

## 2015-12-24 MED ORDER — SPIRONOLACTONE 25 MG PO TABS: 25.0000 mg | ORAL_TABLET | Freq: Every day | ORAL | Status: DC

## 2015-12-24 MED FILL — SPIRONOLACTONE 25 MG TABLET: 25 | 30 days supply | Qty: 30 | Fill #0

## 2015-12-24 NOTE — Telephone Encounter (Signed)
This Case Manager placed call to patient to inform that Dr. Venetia Night had refilled his aldactone. Patient verbalized understanding. Patient also indicated he has had some shortness of breath today, but his shortness of breath was relieved by his albuterol. Discussed using his albuterol inhaler and duoneb as prescribed. Patient verbalized understanding. Reviewed signs and symptoms of when to seek emergency treatment. Patient appreciative of information. Patient also indicated his sleep study was now planned for 12/26/15. Patient aware of upcoming appointment with Dr. Armen Pickup on 01/05/16 at 1045. No additional needs/concerns identified.

## 2015-12-25 ENCOUNTER — Telehealth: Payer: Self-pay

## 2015-12-25 MED FILL — VENTOLIN HFA 90 MCG INHALER: 108 (90 BAS | 25 days supply | Qty: 18 | Fill #1

## 2015-12-25 NOTE — Telephone Encounter (Signed)
This Case Manager received call from patient who stated he needed another albuterol inhaler. Patient's medications reviewed and patient informed he has two refills available on his prescription. Patient verbalized understanding, and he indicated he would call [facility] pharmacy and get inhaler refilled. He also indicated he planned to pick up his aldactone today. No additional needs/concerns identified.

## 2015-12-26 ENCOUNTER — Ambulatory Visit (HOSPITAL_BASED_OUTPATIENT_CLINIC_OR_DEPARTMENT_OTHER): Payer: Self-pay

## 2016-01-05 ENCOUNTER — Encounter: Payer: Self-pay | Admitting: Family Medicine

## 2016-01-05 ENCOUNTER — Ambulatory Visit: Payer: Self-pay | Attending: Family Medicine | Admitting: Family Medicine

## 2016-01-05 VITALS — BP 144/100 | HR 100 | Temp 98.4°F | Resp 16 | Ht 72.0 in | Wt 169.0 lb

## 2016-01-05 DIAGNOSIS — Z7984 Long term (current) use of oral hypoglycemic drugs: Secondary | ICD-10-CM | POA: Insufficient documentation

## 2016-01-05 DIAGNOSIS — E119 Type 2 diabetes mellitus without complications: Secondary | ICD-10-CM | POA: Insufficient documentation

## 2016-01-05 DIAGNOSIS — E1159 Type 2 diabetes mellitus with other circulatory complications: Secondary | ICD-10-CM

## 2016-01-05 DIAGNOSIS — I5022 Chronic systolic (congestive) heart failure: Secondary | ICD-10-CM | POA: Insufficient documentation

## 2016-01-05 DIAGNOSIS — Z7901 Long term (current) use of anticoagulants: Secondary | ICD-10-CM | POA: Insufficient documentation

## 2016-01-05 DIAGNOSIS — F17210 Nicotine dependence, cigarettes, uncomplicated: Secondary | ICD-10-CM | POA: Insufficient documentation

## 2016-01-05 LAB — COMPLETE METABOLIC PANEL WITH GFR
ALBUMIN: 3 g/dL — AB (ref 3.6–5.1)
ALT: 65 U/L — AB (ref 9–46)
AST: 23 U/L (ref 10–35)
Alkaline Phosphatase: 197 U/L — ABNORMAL HIGH (ref 40–115)
BUN: 19 mg/dL (ref 7–25)
CO2: 27 mmol/L (ref 20–31)
CREATININE: 1.14 mg/dL (ref 0.70–1.33)
Calcium: 9 mg/dL (ref 8.6–10.3)
Chloride: 101 mmol/L (ref 98–110)
GFR, EST AFRICAN AMERICAN: 85 mL/min (ref >=60)
GFR, EST NON AFRICAN AMERICAN: 74 mL/min (ref >=60)
GLUCOSE: 449 mg/dL — AB (ref 65–99)
Potassium: 3.6 mmol/L (ref 3.5–5.3)
SODIUM: 137 mmol/L (ref 135–146)
TOTAL PROTEIN: 5.6 g/dL — AB (ref 6.1–8.1)
Total Bilirubin: 1.4 mg/dL — ABNORMAL HIGH (ref 0.2–1.2)

## 2016-01-05 LAB — POCT URINALYSIS DIPSTICK
Bilirubin, UA: NEGATIVE
GLUCOSE UA: 500
KETONES UA: NEGATIVE
LEUKOCYTES UA: NEGATIVE
Nitrite, UA: NEGATIVE
PROTEIN UA: 100
Urobilinogen, UA: 0.2
pH, UA: 5.5

## 2016-01-05 LAB — GLUCOSE, POCT (MANUAL RESULT ENTRY): POC Glucose: 395 mg/dl — AB (ref 70–99)

## 2016-01-05 MED ORDER — GLIPIZIDE 10 MG PO TABS: 10.0000 mg | ORAL_TABLET | Freq: Two times a day (BID) | ORAL | Status: DC

## 2016-01-05 MED ORDER — MEDICAL COMPRESSION STOCKINGS MISC: Status: AC

## 2016-01-05 MED ORDER — SPIRONOLACTONE 25 MG PO TABS: 25.0000 mg | ORAL_TABLET | Freq: Two times a day (BID) | ORAL | Status: DC

## 2016-01-05 MED FILL — ?SPIRONOLACTONE 25 MG TABLE: 25 | 30 days supply | Qty: 60 | Fill #0

## 2016-01-05 MED FILL — IPRAT-ALBUT 0.5-3(2.5) MG/3: 0.5-2.5 (3) | 15 days supply | Qty: 90 | Fill #0

## 2016-01-05 NOTE — Progress Notes (Signed)
F/U DM  HFU SHOB CHF  Pt stated still SHOB, dry cough  Unable steep at night due to fluid on lung  Pain scale #7 - back pain  Tobacco user 4 cigarette per day  No suicidal thoughts in the past two weeks   Elevated glucose today no medication  Had a bowl of cereal    Stated will take his medication when he goes home

## 2016-01-05 NOTE — Assessment & Plan Note (Signed)
A; uncontrolled diabetes. Patient refused treatment of hyperglycemia in office P: Continue glipizide Low carb diet

## 2016-01-05 NOTE — Progress Notes (Signed)
Subjective:  Patient ID: Sean Hinton, male    DOB: 07/31/1961  Age: 55 y.o. MRN: [id number]  CC: Hospitalization Follow-up and Diabetes   HPI Sean Hinton presents for    1. CHRONIC DIABETES  Disease Monitoring  Blood Sugar Ranges: 137 fasting sugar   Polyuria: yes   Visual problems: yes, blurry   Medication Compliance: yes  Medication Side Effects  Hypoglycemia: no   Preventitive Health Care  Eye Exam: he saw Dr. Wynelle Beckmann two weeks ago  Foot Exam: done today   Diet pattern: eating 3-4 times a day, no salt   Exercise: no   2. CHF: taking lasix 40 mg BID and aldactone 25 mg once daily. Has 2 pillow orthopnea and DOE. No CP. Requesting ativan for associated anxiety/panic. Denies cocaine use. Has pulmonology follow up tomorrow.   Social History  Substance Use Topics  . Smoking status: Current Every Day Smoker -- 0.10 packs/day for 30 years    Types: Cigarettes  . Smokeless tobacco: Never Used  . Alcohol Use: No    Outpatient Prescriptions Prior to Visit  Medication Sig Dispense Refill  . albuterol (PROVENTIL HFA;VENTOLIN HFA) 108 (90 Base) MCG/ACT inhaler Inhale 2 puffs into the lungs every 6 (six) hours as needed for wheezing or shortness of breath. 1 Inhaler 2  . amiodarone (PACERONE) 200 MG tablet Take 1 tablet (200 mg total) by mouth daily. 30 tablet 0  . amLODipine (NORVASC) 5 MG tablet Take 1 tablet (5 mg total) by mouth daily. 30 tablet 0  . atorvastatin (LIPITOR) 80 MG tablet Take 1 tablet (80 mg total) by mouth every evening. 30 tablet 3  . carvedilol (COREG) 12.5 MG tablet Take 12.5 mg by mouth 2 (two) times daily with a meal.    . clopidogrel (PLAVIX) 75 MG tablet Take 1 tablet (75 mg total) by mouth daily with breakfast. 30 tablet 0  . Dexlansoprazole (DEXILANT) 30 MG capsule Take 1 capsule (30 mg total) by mouth daily. 90 capsule 3  . digoxin (LANOXIN) 0.125 MG tablet Take 0.125 mg by mouth daily.    Marland Kitchen diltiazem (DILACOR XR) 240 MG 24 hr capsule Take 240  mg by mouth daily.    . furosemide (LASIX) 40 MG tablet Take 1 tablet (40 mg total) by mouth 2 (two) times daily. 60 tablet 1  . glipiZIDE (GLUCOTROL) 10 MG tablet Take 1 tablet (10 mg total) by mouth 2 (two) times daily before a meal. (Patient taking differently: Take 5 mg by mouth daily before breakfast. ) 60 tablet 0  . hydrALAZINE (APRESOLINE) 25 MG tablet Take 2 tablets (50 mg total) by mouth 3 (three) times daily. 90 tablet 0  . ipratropium-albuterol (DUONEB) 0.5-2.5 (3) MG/3ML SOLN Take 3 mLs by nebulization 2 (two) times daily. 360 mL 10  . isosorbide mononitrate (IMDUR) 30 MG 24 hr tablet Take 1 tablet (30 mg total) by mouth daily. 30 tablet 2  . lisinopril (PRINIVIL,ZESTRIL) 20 MG tablet Take 20 mg by mouth daily.    . nitroGLYCERIN (NITROSTAT) 0.4 MG SL tablet Place 1 tablet (0.4 mg total) under the tongue every 5 (five) minutes as needed for chest pain (up to 3 doses). 25 tablet 3  . potassium chloride SA (K-DUR,KLOR-CON) 20 MEQ tablet Take 1 tablet (20 mEq total) by mouth daily. 30 tablet 3  . rivaroxaban (XARELTO) 20 MG TABS tablet Take 1 tablet (20 mg total) by mouth daily with supper. (Patient taking differently: Take 20 mg by mouth daily. ) 30 tablet  3  . spironolactone (ALDACTONE) 25 MG tablet Take 1 tablet (25 mg total) by mouth daily. 30 tablet 1  . traMADol (ULTRAM) 50 MG tablet Take 1 tablet (50 mg total) by mouth every 6 (six) hours as needed for severe pain. (Patient not taking: Reported on 01/05/2016) 30 tablet 0   No facility-administered medications prior to visit.    ROS Review of Systems  Constitutional: Positive for fatigue. Negative for fever, chills and unexpected weight change.  Eyes: Negative for visual disturbance.  Respiratory: Positive for cough and shortness of breath.   Cardiovascular: Positive for leg swelling (in ankles ). Negative for chest pain and palpitations.  Gastrointestinal: Negative for nausea, vomiting, abdominal pain, diarrhea, constipation and  blood in stool.  Endocrine: Negative for polydipsia, polyphagia and polyuria.  Musculoskeletal: Negative for myalgias, back pain, arthralgias, gait problem and neck pain.  Skin: Negative for rash.  Allergic/Immunologic: Negative for immunocompromised state.  Hematological: Negative for adenopathy. Does not bruise/bleed easily.  Psychiatric/Behavioral: Negative for suicidal ideas, sleep disturbance and dysphoric mood. The patient is nervous/anxious.     Objective:  BP 144/100 mmHg  Pulse 100  Temp(Src) 98.4 F (36.9 C) (Oral)  Resp 16  Ht 6' (1.829 m)  Wt 169 lb (76.658 kg)  BMI 22.92 kg/m2  SpO2 96%  BP/Weight 01/05/2016 12/16/2015 12/12/2015  Systolic BP 144 112 112  Diastolic BP 100 77 76  Wt. (Lbs) 169 162 163  BMI 22.92 21.97 22.1    Physical Exam  Constitutional: He appears well-developed and well-nourished. No distress.  HENT:  Head: Normocephalic and atraumatic.  Neck: Normal range of motion. Neck supple.  Cardiovascular: Normal rate, regular rhythm and intact distal pulses.   Murmur heard.  Systolic murmur is present with a grade of 3/6  Pulmonary/Chest: Effort normal and breath sounds normal.  Musculoskeletal: He exhibits edema (trace edema in both feet and ankles ).  Neurological: He is alert.  Skin: Skin is warm and dry. No rash noted. No erythema.  Psychiatric: He has a normal mood and affect.   Lab Results  Component Value Date   HGBA1C 9.9* 11/19/2015   CBG 395 UA: 500 glucose, neg ketones  Patient refused insulin treatment in office    Assessment & Plan:  Sean Hinton was seen today for hospitalization follow-up and diabetes.  Diagnoses and all orders for this visit:  Diabetes mellitus without complication (HCC) -     POCT glucose (manual entry) -     POCT urinalysis dipstick -     Microalbumin/Creatinine Ratio, Urine -     glipiZIDE (GLUCOTROL) 10 MG tablet; Take 1 tablet (10 mg total) by mouth 2 (two) times daily before a meal. -     COMPLETE  METABOLIC PANEL WITH GFR  Chronic systolic CHF (congestive heart failure) (HCC) -     spironolactone (ALDACTONE) 25 MG tablet; Take 1 tablet (25 mg total) by mouth 2 (two) times daily. -     COMPLETE METABOLIC PANEL WITH GFR -     Elastic Bandages & Supports (MEDICAL COMPRESSION STOCKINGS) MISC; 20 mm Hg pressure    No orders of the defined types were placed in this encounter.    Follow-up: No Follow-up on file.   Dessa Phi MD

## 2016-01-05 NOTE — Assessment & Plan Note (Signed)
A: chronic systolic CHF with orthopnea P: Increase aldactone to 25 mg BID CMP today No ativan due to risk of resp sedation Patient to keep close cards f/u

## 2016-01-05 NOTE — Patient Instructions (Addendum)
Sean Hinton was seen today for hospitalization follow-up and diabetes.  Diagnoses and all orders for this visit:  Diabetes mellitus without complication (HCC) -     POCT glucose (manual entry) -     POCT urinalysis dipstick -     Microalbumin/Creatinine Ratio, Urine -     glipiZIDE (GLUCOTROL) 10 MG tablet; Take 1 tablet (10 mg total) by mouth 2 (two) times daily before a meal. -     COMPLETE METABOLIC PANEL WITH GFR  Chronic systolic CHF (congestive heart failure) (HCC) -     spironolactone (ALDACTONE) 25 MG tablet; Take 1 tablet (25 mg total) by mouth 2 (two) times daily. -     COMPLETE METABOLIC PANEL WITH GFR -     Elastic Bandages & Supports (MEDICAL COMPRESSION STOCKINGS) MISC; 20 mm Hg pressure   Increase aldactone to 25 mg twice daily  F/u in 4 weeks for CBG check with pharmacist   Dr. Armen Pickup

## 2016-01-06 ENCOUNTER — Encounter: Payer: Self-pay | Admitting: Pulmonary Disease

## 2016-01-06 ENCOUNTER — Telehealth: Payer: Self-pay

## 2016-01-06 ENCOUNTER — Other Ambulatory Visit (INDEPENDENT_AMBULATORY_CARE_PROVIDER_SITE_OTHER): Payer: Self-pay

## 2016-01-06 ENCOUNTER — Ambulatory Visit (INDEPENDENT_AMBULATORY_CARE_PROVIDER_SITE_OTHER): Payer: Self-pay | Admitting: Pulmonary Disease

## 2016-01-06 ENCOUNTER — Telehealth: Payer: Self-pay | Admitting: Cardiovascular Disease

## 2016-01-06 VITALS — BP 128/88 | HR 88 | Ht 72.0 in | Wt 175.6 lb

## 2016-01-06 DIAGNOSIS — R0609 Other forms of dyspnea: Secondary | ICD-10-CM

## 2016-01-06 DIAGNOSIS — Z720 Tobacco use: Secondary | ICD-10-CM

## 2016-01-06 DIAGNOSIS — I5020 Unspecified systolic (congestive) heart failure: Secondary | ICD-10-CM

## 2016-01-06 DIAGNOSIS — G4710 Hypersomnia, unspecified: Secondary | ICD-10-CM

## 2016-01-06 LAB — CBC WITH DIFFERENTIAL/PLATELET
BASOS ABS: 0 10*3/uL (ref 0.0–0.1)
Basophils Relative: 0.1 % (ref 0.0–3.0)
EOS ABS: 0 10*3/uL (ref 0.0–0.7)
Eosinophils Relative: 0.3 % (ref 0.0–5.0)
HCT: 42.9 % (ref 39.0–52.0)
Hemoglobin: 14.1 g/dL (ref 13.0–17.0)
LYMPHS ABS: 1.2 10*3/uL (ref 0.7–4.0)
Lymphocytes Relative: 9.5 % — ABNORMAL LOW (ref 12.0–46.0)
MCHC: 32.8 g/dL (ref 30.0–36.0)
MCV: 88.7 fl (ref 78.0–100.0)
MONO ABS: 0.3 10*3/uL (ref 0.1–1.0)
Monocytes Relative: 2.7 % — ABNORMAL LOW (ref 3.0–12.0)
Neutro Abs: 10.7 10*3/uL — ABNORMAL HIGH (ref 1.4–7.7)
Platelets: 208 10*3/uL (ref 150.0–400.0)
RBC: 4.84 Mil/uL (ref 4.22–5.81)
RDW: 16.1 % — ABNORMAL HIGH (ref 11.5–15.5)
WBC: 12.3 10*3/uL — ABNORMAL HIGH (ref 4.0–10.5)

## 2016-01-06 LAB — MICROALBUMIN / CREATININE URINE RATIO
Creatinine, Urine: 61 mg/dL (ref 20–370)
MICROALB UR: 15.8 mg/dL
MICROALB/CREAT RATIO: 259 ug/mg{creat} — AB (ref <30)

## 2016-01-06 LAB — BASIC METABOLIC PANEL
BUN: 23 mg/dL (ref 6–23)
CO2: 26 mEq/L (ref 19–32)
CREATININE: 1.32 mg/dL (ref 0.40–1.50)
Calcium: 9.1 mg/dL (ref 8.4–10.5)
Chloride: 97 mEq/L (ref 96–112)
GFR: 73.06 mL/min (ref >60.00)
Glucose, Bld: 587 mg/dL (ref 70–99)
Potassium: 3.7 mEq/L (ref 3.5–5.1)
Sodium: 135 mEq/L (ref 135–145)

## 2016-01-06 LAB — BRAIN NATRIURETIC PEPTIDE: PRO B NATRI PEPTIDE: 1491 pg/mL — AB (ref 0.0–100.0)

## 2016-01-06 NOTE — Telephone Encounter (Signed)
Spoke with pt's fiance and advised her of glucose level. Explained that this result is a critical level result and he needs immediate treatment. She states that the pt has insulin and that she is aware of how to dose it. She states that she will give him 5cc and I instructed her to call me in 30 mins to advise of a current glucose reading.

## 2016-01-06 NOTE — Assessment & Plan Note (Signed)
Patient with recent exertional dyspnea most likely secondary to congestive heart failure. Need to rule out COPD. Plan for PFT once heart failure is better controlled.

## 2016-01-06 NOTE — Telephone Encounter (Signed)
Per Dr. Christene Slates' note from today's OV, wanted recommendation from Cardiology on whether patient needs to be seen sooner than May appt d/t current problems.  Had advised also to continue current medications but to seek Dr Croitoru's advice on whether or not to increase lasix.  Currently scheduled to see Dr. Royann Shivers. Will defer for advice. I do not have a DPR on file to return call to the initial caller. Call to patient went unanswered.

## 2016-01-06 NOTE — Progress Notes (Signed)
Subjective:    Patient ID: Sean Hinton, male    DOB: January 30, 1961, 55 y.o.   MRN: [id number]  HPI  55 yo male smoker  with Atrial Fib and Systolic CHF with severe mitral regurg. With ongoing cocaine use. Seen for pulmonary consult 11/30/15 for acute resp failure due to decompensated CHF    FOV (12/12/2015) Post hospital follow up  Pt returns for a post hospital follow up .  Admitted 11/30/15 for acute CHF with pulmonary edema  with underlying cocaine use. He had aspiration PNA , he was treated with IV abx, initial BIPAP support , diuresis .  CTA chest showed CHF patten, no PE.  Recent echo shows 40-45%, LA  Severe dilation, MV severe regurg, PAP .  He says he is feeling some better but still gets very sob .  Went back to ER 12/09/15  for sob , CXR showed improved bilateral opacities.  tox screen positive again for cocaine  We discussed cessation and dangers of ongoing use. Looking into counseling.  He denies chest pain, hemoptysis, fever, discolored mucus.  Does have drainage in throat.  Has smoked 1/2 ppd x 30 yr.   ROV (01/06/16)  Patient is here as a follow-up with his fiance. Since last seen, he has not been admitted. He is more short of breath since Saturday. Denies chest pain. Denies fevers. Dry cough. Still smokes a couple cigarettes a day. Last time he is cocaine was a couple days ago. Saw a cardiologist last month.   Review of Systems  Constitutional: Positive for fatigue. Negative for fever and chills.  HENT: Positive for congestion.   Eyes: Negative.   Respiratory: Positive for cough and shortness of breath. Negative for wheezing and stridor.   Cardiovascular: Negative.   Gastrointestinal: Positive for abdominal pain.  Endocrine: Negative.   Genitourinary: Negative.   Musculoskeletal: Negative.   Skin: Negative.   Allergic/Immunologic: Negative.   Neurological: Negative.   Hematological: Negative.   Psychiatric/Behavioral: Negative.   All other systems reviewed  and are negative.    Past Medical History  Diagnosis Date  . Diabetes mellitus with complication (HCC)   . Hypertension   . Syncope     a. 02/2015 - felt 2/2 rapid AF/AFL.  Marland Kitchen Atrial fibrillation and flutter (HCC)     a. Dx 02/2015 - placed on Xarelto; b. 09/2015 recurrence in setting of PNA.  . Ischemic cardiomyopathy     a. 09/2015 Echo: EF 35-40%.  Marland Kitchen CAD (coronary artery disease)     a. Dx 02/2015 - 2V CAD with CTO LAD, diffuse dz in PDA, OM - med rx.  . Hypertensive heart disease   . Hyperlipidemia   . Mitral regurgitation     a. Mod by echo 02/2015,  . Tobacco abuse   . Chronic systolic CHF (congestive heart failure) (HCC)     a. 09/2015 Echo: EF 35-40%, sev apical/antlat, apical HK, mild MR, mildly dil LA.  Marland Kitchen ARF (acute respiratory failure) (HCC)   . Pneumonia 09/2015  . GERD (gastroesophageal reflux disease)   . Headache   . Arthritis     ra  . NSTEMI (non-ST elevated myocardial infarction) (HCC) 10/2015     Family History  Problem Relation Age of Onset  . Diabetes Mother   . Hypertension Mother   . Heart disease Mother   . Cancer Mother     stomach cancer   . Diabetes Father   . Hypertension Father   . Heart disease Father   .  Diabetes Sister   . Hypertension Sister   . Cancer Sister     breast cancer   . Diabetes Brother   . Hypertension Brother   . Heart disease Brother   . Stroke Brother   . Heart attack Mother   . Heart attack Father   . Heart attack Brother      Past Surgical History  Procedure Laterality Date  . Right thumb surgery     . Cardiac catheterization N/A 02/24/2015    Procedure: Left Heart Cath and Coronary Angiography;  Surgeon: Marykay Lex, MD;  Location: [facility] INVASIVE CV LAB CUPID;  Service: Cardiovascular;  Laterality: N/A;  . Cardiac catheterization  02/24/2015    Procedure: Coronary/Bypass Graft CTO Intervention;  Surgeon: Marykay Lex, MD;  Location: [facility] INVASIVE CV LAB CUPID;  Service: Cardiovascular;;  . Cardiac catheterization  N/A 11/03/2015    Procedure: Left Heart Cath and Coronary Angiography;  Surgeon: Tonny Bollman, MD;  Location: [facility] INVASIVE CV LAB;  Service: Cardiovascular;  Laterality: N/A;  . Cardiac catheterization N/A 11/03/2015    Procedure: Coronary Stent Intervention;  Surgeon: Tonny Bollman, MD;  Location: [facility] INVASIVE CV LAB;  Service: Cardiovascular;  Laterality: N/A;  RAMUS    Social History   Social History  . Marital Status: Single    Spouse Name: N/A  . Number of Children: N/A  . Years of Education: N/A   Occupational History  . Not on file.   Social History Main Topics  . Smoking status: Current Every Day Smoker -- 0.10 packs/day for 30 years    Types: Cigarettes  . Smokeless tobacco: Never Used  . Alcohol Use: No  . Drug Use: Yes    Special: Cocaine     Comment: "last use 1991", Positive UDS 10/2015./last cocaine use on 10/19/2015  . Sexual Activity: Not on file   Other Topics Concern  . Not on file   Social History Narrative   still smokes cigarettes and uses cocaine. Denies IVDU. Denies ETOH.   No Known Allergies   Outpatient Prescriptions Prior to Visit  Medication Sig Dispense Refill  . albuterol (PROVENTIL HFA;VENTOLIN HFA) 108 (90 Base) MCG/ACT inhaler Inhale 2 puffs into the lungs every 6 (six) hours as needed for wheezing or shortness of breath. 1 Inhaler 2  . amiodarone (PACERONE) 200 MG tablet Take 1 tablet (200 mg total) by mouth daily. 30 tablet 0  . amLODipine (NORVASC) 5 MG tablet Take 1 tablet (5 mg total) by mouth daily. 30 tablet 0  . atorvastatin (LIPITOR) 80 MG tablet Take 1 tablet (80 mg total) by mouth every evening. 30 tablet 3  . carvedilol (COREG) 12.5 MG tablet Take 12.5 mg by mouth 2 (two) times daily with a meal.    . clopidogrel (PLAVIX) 75 MG tablet Take 1 tablet (75 mg total) by mouth daily with breakfast. 30 tablet 0  . Dexlansoprazole (DEXILANT) 30 MG capsule Take 1 capsule (30 mg total) by mouth daily. 90 capsule 3  . digoxin (LANOXIN) 0.125 MG  tablet Take 0.125 mg by mouth daily.    Marland Kitchen diltiazem (DILACOR XR) 240 MG 24 hr capsule Take 240 mg by mouth daily.    Clinical research associate Bandages & Supports (MEDICAL COMPRESSION STOCKINGS) MISC 20 mm Hg pressure 2 each 3  . furosemide (LASIX) 40 MG tablet Take 1 tablet (40 mg total) by mouth 2 (two) times daily. 60 tablet 1  . glipiZIDE (GLUCOTROL) 10 MG tablet Take 1 tablet (10 mg total) by mouth  2 (two) times daily before a meal. 60 tablet 5  . hydrALAZINE (APRESOLINE) 25 MG tablet Take 2 tablets (50 mg total) by mouth 3 (three) times daily. 90 tablet 0  . ipratropium-albuterol (DUONEB) 0.5-2.5 (3) MG/3ML SOLN Take 3 mLs by nebulization 2 (two) times daily. 360 mL 10  . isosorbide mononitrate (IMDUR) 30 MG 24 hr tablet Take 1 tablet (30 mg total) by mouth daily. 30 tablet 2  . lisinopril (PRINIVIL,ZESTRIL) 20 MG tablet Take 20 mg by mouth daily.    . nitroGLYCERIN (NITROSTAT) 0.4 MG SL tablet Place 1 tablet (0.4 mg total) under the tongue every 5 (five) minutes as needed for chest pain (up to 3 doses). 25 tablet 3  . potassium chloride SA (K-DUR,KLOR-CON) 20 MEQ tablet Take 1 tablet (20 mEq total) by mouth daily. 30 tablet 3  . rivaroxaban (XARELTO) 20 MG TABS tablet Take 1 tablet (20 mg total) by mouth daily with supper. (Patient taking differently: Take 20 mg by mouth daily. ) 30 tablet 3  . spironolactone (ALDACTONE) 25 MG tablet Take 1 tablet (25 mg total) by mouth 2 (two) times daily. 60 tablet 0  . traMADol (ULTRAM) 50 MG tablet Take 1 tablet (50 mg total) by mouth every 6 (six) hours as needed for severe pain. 30 tablet 0   No facility-administered medications prior to visit.   No orders of the defined types were placed in this encounter.    Takes lasix 40 mg BID.       Objective:   Physical Exam Vitals:  Filed Vitals:   01/06/16 1101  BP: 128/88  Pulse: 88  Height: 6' (1.829 m)  Weight: 175 lb 9.6 oz (79.652 kg)  SpO2: 96%    Constitutional/General:  Pleasant, well-nourished,  well-developed, not in any distress,  Comfortably seating.  Well kempt  Body mass index is 23.81 kg/(m^2). Wt Readings from Last 3 Encounters:  01/06/16 175 lb 9.6 oz (79.652 kg)  01/05/16 169 lb (76.658 kg)  12/16/15 162 lb (73.483 kg)     HEENT: Pupils equal and reactive to light and accommodation. Anicteric sclerae. Normal nasal mucosa.   No oral  lesions,  mouth clear,  oropharynx clear, no postnasal drip. (-) Oral thrush. No dental caries.  Airway - Mallampati class III  Neck: No masses. Midline trachea. No JVD, (-) LAD. (-) bruits appreciated.  Respiratory/Chest: Grossly normal chest. (-) deformity. (-) Accessory muscle use.  Symmetric expansion. (-) Tenderness on palpation.  Resonant on percussion.  Diminished BS on both lower lung zones. (-) wheezing,  Rhonchi Crackles lower lung zones.  (-) egophony  Cardiovascular: Regular rate and  rhythm, heart sounds normal, no murmur or gallops, no peripheral edema  Gastrointestinal:  Normal bowel sounds. Soft, non-tender. No hepatosplenomegaly.  (-) masses.   Musculoskeletal:  Normal muscle tone. Normal gait.   Extremities: Grossly normal. (-) clubbing, cyanosis.  (-) edema  Skin: (-) rash,lesions seen.   Neurological/Psychiatric : alert, oriented to time, place, person. Normal mood and affect        Assessment & Plan:  CHF (congestive heart failure) (HCC) Pt with systolic CHF, who continues to smoke cigarettes and use cocaine. He was admitted February/2017 for acute CHF exacerbation and respiratory failure. Chest CTA (February 2017)-pulmonary edema. No filling defect 2D echo shows 40-45%, LA  Severe dilation, MV severe regurg, PAP .  (11/2015) Patient follows with cardiology. Has been more short of breath than usual the last week. He increased  his Lasix from once a day  to 40 mg twice a day. Told patient to call cardiology office today and he needs to be seen today or tomorrow. We'll order CBC, BMP. If he  is not able to touch base with cardiology office, told him he might need an x-ray dose of Lasix. Advised on smoking cessation and avoiding cocaine use.   Tobacco user Smokes few cigarettes a day. Smoking cessation done.  Exertional dyspnea Patient with recent exertional dyspnea most likely secondary to congestive heart failure. Need to rule out COPD. Plan for PFT once heart failure is better controlled.  Hypersomnia Pt with hypersomnia, snoring, gasping, choking. Hypersomnia affects functionality. Difficult to control CHF with hypersomnia, possible untreated sleep apnea. Plan for sleep study in May 2017. Likely will need oxygen also. Tolerated BiPAP when he was admitted in February/2017.   Return to clinic in 2 mos. patient needs close follow-up with PCP and cardiology service for the heart failure.  Pollie Meyer, MD 01/06/2016, 12:45 PM [facility] Pager [phone] After 3 pm or if no answer, call [phone]

## 2016-01-06 NOTE — Assessment & Plan Note (Signed)
Pt with systolic CHF, who continues to smoke cigarettes and use cocaine. He was admitted February/2017 for acute CHF exacerbation and respiratory failure. Chest CTA (February 2017)-pulmonary edema. No filling defect 2D echo shows 40-45%, LA  Severe dilation, MV severe regurg, PAP .  (11/2015) Patient follows with cardiology. Has been more short of breath than usual the last week. He increased  his Lasix from once a day to 40 mg twice a day. Told patient to call cardiology office today and he needs to be seen today or tomorrow. We'll order CBC, BMP. If he is not able to touch base with cardiology office, told him he might need an x-ray dose of Lasix. Advised on smoking cessation and avoiding cocaine use.

## 2016-01-06 NOTE — Telephone Encounter (Signed)
New message      Pt saw his pulmonary doctor this am.  The doctor told the pt that he appears to be retaining fluid.  He ordered a cbc,bmet and bnp.  Pt was instructed to call his cardiologist to consider increasing his fluid pill.  Please advise

## 2016-01-06 NOTE — Assessment & Plan Note (Signed)
Pt with hypersomnia, snoring, gasping, choking. Hypersomnia affects functionality. Difficult to control CHF with hypersomnia, possible untreated sleep apnea. Plan for sleep study in May 2017. Likely will need oxygen also. Tolerated BiPAP when he was admitted in February/2017.

## 2016-01-06 NOTE — Telephone Encounter (Signed)
Returned call to patient - discussed concerns by PCP and pulmonology. Pt increased his spironolactone to 25mg  BID per PCP recommendation. Elevated BNP - saw Pulmonology today who recommended cardiology visit for recommendations to increase lasix, other changes.  Glucose also elevated.  He has already received a call w/ lab results & orders to address the elevated glucose, patient is taking additional glucotrol and insulin today.  Pt stated open to be seen any time, no schedule conflicts. Pt was added for tomorrow 8am appt w/ at [facility] office. Pt given appt time/loc/provider information. Voiced acknowledgment.

## 2016-01-06 NOTE — Assessment & Plan Note (Signed)
Smokes few cigarettes a day. Smoking cessation done.

## 2016-01-06 NOTE — Patient Instructions (Signed)
1. Please call your cardiology office today. He might need to be seen sooner because of heart failure exacerbation. 2. We'll do CBC, BMP. 3. Continue cardiac meds. You areon  Lasix 40 mg twice a day. May end up needing an extra dose of Lasix if you're not able to get hold of her cardiologist. 4. Sleep study is scheduled. 5. Go to ER if with worsening symptoms.   Return to clinic in mid May 2017.

## 2016-01-06 NOTE — Telephone Encounter (Signed)
Spoke with pt's fiance who states that she has given him some of her Novolog and his blood sugar is down to 317. Pt has still not eaten anything. Advised her that pt needs to eat something and then retake his blood sugar. Advised her that if it does not continue to drop he needs to be seen at [place] or ED. Pt does not want to go to ED for fear of being admitted, so advised her that he at least needs to go to [facility] or contact his PCP for after-hours recommendations. She voiced understanding. No further questions.

## 2016-01-06 NOTE — Telephone Encounter (Signed)
Please schedule earlier with first available APP.

## 2016-01-06 NOTE — Telephone Encounter (Signed)
Per Zenon Mayo, she has been trying to call her. Sending TE to [facility]

## 2016-01-06 NOTE — Telephone Encounter (Signed)
Fiance is calling back states the patient glucose level is up to 317 after insulin and still has not ate anything  [phone]

## 2016-01-07 ENCOUNTER — Ambulatory Visit (INDEPENDENT_AMBULATORY_CARE_PROVIDER_SITE_OTHER): Payer: Self-pay | Admitting: Physician Assistant

## 2016-01-07 ENCOUNTER — Encounter: Payer: Self-pay | Admitting: Physician Assistant

## 2016-01-07 ENCOUNTER — Encounter: Payer: Self-pay | Admitting: Clinical

## 2016-01-07 ENCOUNTER — Telehealth: Payer: Self-pay | Admitting: Pulmonary Disease

## 2016-01-07 ENCOUNTER — Other Ambulatory Visit: Payer: Self-pay

## 2016-01-07 VITALS — BP 130/70 | HR 98 | Ht 72.0 in | Wt 174.0 lb

## 2016-01-07 DIAGNOSIS — I5043 Acute on chronic combined systolic (congestive) and diastolic (congestive) heart failure: Secondary | ICD-10-CM

## 2016-01-07 DIAGNOSIS — I340 Nonrheumatic mitral (valve) insufficiency: Secondary | ICD-10-CM

## 2016-01-07 DIAGNOSIS — I4891 Unspecified atrial fibrillation: Secondary | ICD-10-CM

## 2016-01-07 DIAGNOSIS — I25118 Atherosclerotic heart disease of native coronary artery with other forms of angina pectoris: Secondary | ICD-10-CM

## 2016-01-07 DIAGNOSIS — R0600 Dyspnea, unspecified: Secondary | ICD-10-CM

## 2016-01-07 DIAGNOSIS — Z720 Tobacco use: Secondary | ICD-10-CM

## 2016-01-07 DIAGNOSIS — I4892 Unspecified atrial flutter: Secondary | ICD-10-CM

## 2016-01-07 DIAGNOSIS — R0602 Shortness of breath: Secondary | ICD-10-CM

## 2016-01-07 DIAGNOSIS — I5022 Chronic systolic (congestive) heart failure: Secondary | ICD-10-CM

## 2016-01-07 DIAGNOSIS — I10 Essential (primary) hypertension: Secondary | ICD-10-CM

## 2016-01-07 MED ORDER — POTASSIUM CHLORIDE CRYS ER 20 MEQ PO TBCR: EXTENDED_RELEASE_TABLET | ORAL | Status: DC

## 2016-01-07 MED ORDER — FUROSEMIDE 40 MG PO TABS: ORAL_TABLET | ORAL | Status: DC

## 2016-01-07 NOTE — Telephone Encounter (Signed)
Pt was seen on 3/14 for sob, CHF exacerbation.  Blood work was done which showed elevated FBS at 500 mg %++. BNP was elevated as well.   Staff called up pt. Could not get hold of him. Staff spoke to fiancee who was with him when he visited the office. Mentioned to fiancee to bring pt to ER. Pt did NOT want. Steffanie Rainwater was advised to call PCP office soon or go to urgent care.  Fiancee gave pt insulin. Rpt CBG was 300 mg %.  We advised pt to go to urgent care or f/u with pcp within 24 hrs.   Steffanie Rainwater understands and will make an appointment to see PCP or go to urgent care.

## 2016-01-07 NOTE — Progress Notes (Signed)
Patient ID: Sean Hinton, male   DOB: December 29, 1960, 55 y.o.   MRN: [id number]    Date:  01/07/2016   ID:  Sean Hinton, DOB [birth year], MRN [id number]  PCP:  Sean Paula, MD  Primary Cardiologist:  Sean Hinton   Chief Complaint: Dyspnea   History of Present Illness: Sean Hinton is a 55 y.o. male with premature coronary artery disease, ischemic cardiomyopathy, history of paroxysmal atrial fibrillation and previous syncope related to paroxysmal atrial flutter with one-to-one AV conduction, severe hypertension, hyperlipidemia, type 2 diabetes mellitus, cocaine abuse, tobacco abuse returns for follow-up.  On January 12 he was hospitalized for acute myocardial infarction due to thrombotic occlusion of the ramus intermedius artery in the setting of cocaine abuse. He has been hospitalized again on January 25 with acute heart failure exacerbation after again using cocaine. He had acute hypercapnic respiratory failure and required brief (roughly 24-hour) intubation and mechanical ventilation. There was a suspicion for aspiration pneumonia and he also received antibiotics. UDS was positive only for cocaine but also for barbiturates. Had atrial fibrillation with rapid ventricular response requiring amiodarone drip followed by oral amiodarone. Again his beta blockers had to be stopped and he was placed on amlodipine. Had only a minor increase in cardiac troponin this time. EF by echo was 40-45 %. Continues to have severe mitral regurgitation.Transient acute kidney injury resolved by hospital discharge. Hemoglobin A1c was 9.9%. During the previous hospitalization he had vehemently professed that he would never use cocaine again. He was discharged on January 29.   On February 5th he was seen with hypoxic respiratory failure in the [facility] emergency room. He states he had cough productive of copious greenish mucus. His chest x-ray showed bilateral pulmonary edema and he required BiPAP. He improved and  was discharged from the hospital on February 6. Again, his urine drug screen was positive for cocaine (urine drug screens positive on December 20, January 9, January 25, February 5). He has not had a clean drug screen in the last 2 months.  The patient presents to the office with SOB, orthopnea, PND, cough, excessive fatigue and LEE.  He was seen by Dr. Christene Hinton who checked a BNP which was 1491.   Sean Hinton reports eating a bunch of pizza last week which is when his symptoms started to develop.  His weight went up 5 pounds in the last two days and he had previous weight charted at 163 which means he's 11 pounds up.  He also reports abd pain, mild nausea.  He is taking all meds as prescribed.  The patient currently denies vomiting, fever, dizziness, congestion, hematochezia, melena, lower extremity edema, claudication.  Wt Readings from Last 3 Encounters:  01/07/16 174 lb (78.926 kg)  01/06/16 175 lb 9.6 oz (79.652 kg)  01/05/16 169 lb (76.658 kg)     Past Medical History  Diagnosis Date  . Diabetes mellitus with complication (HCC)   . Hypertension   . Syncope     a. 02/2015 - felt 2/2 rapid AF/AFL.  Marland Kitchen Atrial fibrillation and flutter (HCC)     a. Dx 02/2015 - placed on Xarelto; b. 09/2015 recurrence in setting of PNA.  . Ischemic cardiomyopathy     a. 09/2015 Echo: EF 35-40%.  Marland Kitchen CAD (coronary artery disease)     a. Dx 02/2015 - 2V CAD with CTO LAD, diffuse dz in PDA, OM - med rx.  . Hypertensive heart disease   . Hyperlipidemia   . Mitral regurgitation  a. Mod by echo 02/2015,  . Tobacco abuse   . Chronic systolic CHF (congestive heart failure) (HCC)     a. 09/2015 Echo: EF 35-40%, sev apical/antlat, apical HK, mild MR, mildly dil LA.  Marland Kitchen ARF (acute respiratory failure) (HCC)   . Pneumonia 09/2015  . GERD (gastroesophageal reflux disease)   . Headache   . Arthritis     ra  . NSTEMI (non-ST elevated myocardial infarction) (HCC) 10/2015    Current Outpatient Prescriptions    Medication Sig Dispense Refill  . albuterol (PROVENTIL HFA;VENTOLIN HFA) 108 (90 Base) MCG/ACT inhaler Inhale 2 puffs into the lungs every 6 (six) hours as needed for wheezing or shortness of breath. 1 Inhaler 2  . amiodarone (PACERONE) 200 MG tablet Take 1 tablet (200 mg total) by mouth daily. 30 tablet 0  . amLODipine (NORVASC) 5 MG tablet Take 1 tablet (5 mg total) by mouth daily. 30 tablet 0  . atorvastatin (LIPITOR) 80 MG tablet Take 1 tablet (80 mg total) by mouth every evening. 30 tablet 3  . carvedilol (COREG) 12.5 MG tablet Take 12.5 mg by mouth 2 (two) times daily with a meal.    . clopidogrel (PLAVIX) 75 MG tablet Take 1 tablet (75 mg total) by mouth daily with breakfast. 30 tablet 0  . Dexlansoprazole (DEXILANT) 30 MG capsule Take 1 capsule (30 mg total) by mouth daily. 90 capsule 3  . digoxin (LANOXIN) 0.125 MG tablet Take 0.125 mg by mouth daily.    Marland Kitchen diltiazem (DILACOR XR) 240 MG 24 hr capsule Take 240 mg by mouth daily.    Clinical research associate Bandages & Supports (MEDICAL COMPRESSION STOCKINGS) MISC 20 mm Hg pressure 2 each 3  . furosemide (LASIX) 40 MG tablet TAKE 2 TABLETS BY MOUTH IN THE A.M AND 1 TABLET IN THE PM  X'S 3 DAYS THEN TAKE 1 TABLET BY MOUTH TWICE A DAY 60 tablet 1  . glipiZIDE (GLUCOTROL) 10 MG tablet Take 1 tablet (10 mg total) by mouth 2 (two) times daily before a meal. 60 tablet 5  . hydrALAZINE (APRESOLINE) 25 MG tablet Take 2 tablets (50 mg total) by mouth 3 (three) times daily. 90 tablet 0  . ipratropium-albuterol (DUONEB) 0.5-2.5 (3) MG/3ML SOLN Take 3 mLs by nebulization 2 (two) times daily. 360 mL 10  . isosorbide mononitrate (IMDUR) 30 MG 24 hr tablet Take 1 tablet (30 mg total) by mouth daily. 30 tablet 2  . lisinopril (PRINIVIL,ZESTRIL) 20 MG tablet Take 20 mg by mouth daily.    . nitroGLYCERIN (NITROSTAT) 0.4 MG SL tablet Place 1 tablet (0.4 mg total) under the tongue every 5 (five) minutes as needed for chest pain (up to 3 doses). 25 tablet 3  . potassium  chloride SA (K-DUR,KLOR-CON) 20 MEQ tablet TAKE 1 TABLET BY MOUTH TWICE A DAY X'S 3 DAYS THEN TAKE 1 TABLET DAILY 30 tablet 3  . rivaroxaban (XARELTO) 20 MG TABS tablet Take 1 tablet (20 mg total) by mouth daily with supper. (Patient taking differently: Take 20 mg by mouth daily. ) 30 tablet 3  . spironolactone (ALDACTONE) 25 MG tablet Take 1 tablet (25 mg total) by mouth 2 (two) times daily. 60 tablet 0  . traMADol (ULTRAM) 50 MG tablet Take 1 tablet (50 mg total) by mouth every 6 (six) hours as needed for severe pain. 30 tablet 0   No current facility-administered medications for this visit.    Allergies:   No Known Allergies  Social History:  The patient  reports that he has been smoking Cigarettes.  He has a 3 pack-year smoking history. He has never used smokeless tobacco. He reports that he uses illicit drugs (Cocaine). He reports that he does not drink alcohol.   Family history:   Family History  Problem Relation Age of Onset  . Diabetes Mother   . Hypertension Mother   . Heart disease Mother   . Cancer Mother     stomach cancer   . Diabetes Father   . Hypertension Father   . Heart disease Father   . Diabetes Sister   . Hypertension Sister   . Cancer Sister     breast cancer   . Diabetes Brother   . Hypertension Brother   . Heart disease Brother   . Stroke Brother   . Heart attack Mother   . Heart attack Father   . Heart attack Brother     ROS:  Please see the history of present illness.  All other systems reviewed and negative.   PHYSICAL EXAM: VS:  BP 130/70 mmHg  Pulse 98  Ht 6' (1.829 m)  Wt 174 lb (78.926 kg)  BMI 23.59 kg/m2  SpO2 97% Well nourished, well developed, in no acute distress HEENT: Pupils are equal round react to light accommodation extraocular movements are intact.  Neck: no JVDNo cervical lymphadenopathy. Cardiac: Regular rate and rhythm 3/6 high-pitched systolic murmur Lungs:  Decreased breath sounds throughout with mild rales Abd: soft,  the patient complained of severe tenderness in the right upper quadrant when I was only touching his skin with extremely minimal pressure. Ext: 1+ lower extremity edema.  2+ radial and dorsalis pedis pulses. Skin: warm and dry Neuro:  Grossly normal  EKG:  Sinus rhythm rate 98 bpm   ASSESSMENT AND PLAN:  Problem List Items Addressed This Visit    Tobacco use   Severe essential hypertension   Relevant Medications   furosemide (LASIX) 40 MG tablet   Other Relevant Orders   EKG 12-Lead   Mitral regurgitation   Relevant Medications   furosemide (LASIX) 40 MG tablet   Dyspnea   Chronic systolic CHF (congestive heart failure) (HCC) (Chronic)   Relevant Medications   furosemide (LASIX) 40 MG tablet   CAD (coronary artery disease)   Relevant Medications   furosemide (LASIX) 40 MG tablet   Other Relevant Orders   EKG 12-Lead   Atrial fibrillation and flutter (HCC)   Relevant Medications   furosemide (LASIX) 40 MG tablet   Other Relevant Orders   EKG 12-Lead   Acute on chronic combined systolic and diastolic congestive heart failure (HCC) - Primary   Relevant Medications   furosemide (LASIX) 40 MG tablet   Other Relevant Orders   EKG 12-Lead   DG Chest 2 View    Other Visit Diagnoses    SOB (shortness of breath)        Relevant Orders    DG Chest 2 View      SeanKalter's weight is up about 11 pounds from previously charted weight of 163. He is up 5 pounds just in the last 2 days. It sounds as though he's having a CHF exacerbation due to dietary indiscretion. He has decreased breath sounds throughout mild rales 1+ pedal edema but no JVD.  BNP is elevated at 1400.  I've asked him to increase his Lasix to 80 mg in the morning and 40 at night the next 3 days and his weight is decreasing and his breathing is improving he'll  resume 40 bid.  Also take an extra potassium. Potassium yesterday was 3.7 If not, he'll call the office on Friday. Will check a chest x-ray today.  O2 saturation at  rest was 97%.  I reemphasized low sodium diet daily weight monitoring.  His glucose yesterday was 587 and is girlfriend said this morning he was in the low 200s.  Follow-up in the office next week

## 2016-01-07 NOTE — Patient Instructions (Signed)
Medication Instructions:  Your physician has recommended you make the following change in your medication:  1.  INCREASE the Lasix to 80 mg in a.m and 40 mg in the p.m X's 3 days then go back down to your original dose 2.  INCREASE the Potassium to taking an extra pill a day X's 3 days  If your weight increases, please call our office at the # listed above.   Labwork: None ordered  Testing/Procedures: A chest x-ray takes a picture of the organs and structures inside the chest, including the heart, lungs, and blood vessels. This test can show several things, including, whether the heart is enlarges; whether fluid is building up in the lungs; and whether pacemaker / defibrillator leads are still in place.   Follow-Up: Your physician recommends that you schedule a follow-up appointment in: 1 WEEK WITH 1ST AVAILABLE EXTENDER   Any Other Special Instructions Will Be Listed Below (If Applicable).  X-rays X-rays are tests that create pictures of the inside of your body using radiation. Different body parts absorb different amounts of radiation, which show up on the X-ray pictures in shades of black, gray, and white.  X-rays are used to look for many health conditions, including broken bones, lung problems, and some types of cancer.  LET [facility] CARE PROVIDER KNOW ABOUT:  Any allergies you have.  All medicines you are taking, including vitamins, herbs, eye drops, creams, and over-the-counter medicines.  Previous surgeries you have had.  Medical conditions you have. RISK AND COMPLICATIONS Getting an X-ray is a safe procedure.  BEFORE THE PROCEDURE  Tell the X-ray technician if you are pregnant or might be pregnant.  You may be asked to wear a protective lead apron to hide parts of your body from the X-ray.  You usually will need to undress whatever part of your body needs the X-ray. You will be given a hospital gown to wear, if needed.  You may need to remove your glasses,  jewelry, and other metal objects. PROCEDURE   The X-ray machine creates a picture by using a tiny burst of radiation. It is painless.  You may need to have several pictures taken at different angles.  You will need to try to be as still as you can during the examination to get the best possible images. AFTER THE PROCEDURE  You will be able to resume your normal activities.  The X-ray will be examined by your health care provider or a radiology specialist.  It is your responsibility to get your test results. Ask your health care provider when to expect your results and how to get them.   This information is not intended to replace advice given to you by your health care provider. Make sure you discuss any questions you have with your health care provider.   Document Released: 10/11/2005 Document Revised: 11/01/2014 Document Reviewed: 12/05/2013 Elsevier Interactive Patient Education [employer]   If you need a refill on your cardiac medications before your next appointment, please call your pharmacy.

## 2016-01-07 NOTE — Progress Notes (Signed)
Depression screen [facility] 2/9 01/05/2016 12/16/2015 11/25/2015 11/25/2015 11/11/2015  Decreased Interest 2 0 0 0 0  Down, Depressed, Hopeless 0 0 0 0 0  PHQ - 2 Score 2 0 0 0 0  Altered sleeping 3 - - - -  Tired, decreased energy 2 - - - -  Change in appetite 0 - - - -  Feeling bad or failure about yourself  0 - - - -  Trouble concentrating 0 - - - -  Moving slowly or fidgety/restless 1 - - - -  Suicidal thoughts 0 - - - -  PHQ-9 Score 8 - - - -    GAD 7 : Generalized Anxiety Score 01/05/2016 11/25/2015  Nervous, Anxious, on Edge 0 0  Control/stop worrying 0 0  Worry too much - different things 0 0  Trouble relaxing 0 1  Restless 0 1  Easily annoyed or irritable 0 0  Afraid - awful might happen 0 0  Total GAD 7 Score 0 2

## 2016-01-08 ENCOUNTER — Telehealth: Payer: Self-pay | Admitting: *Deleted

## 2016-01-08 NOTE — Telephone Encounter (Signed)
LVM to return call.

## 2016-01-08 NOTE — Telephone Encounter (Signed)
-----   Message from Dessa Phi, MD sent at 01/06/2016  8:32 AM EDT ----- Hyperglycemia on CMP Elevated urine microalbumin Normal potassium Continue current care plan Tighter blood sugar control needed

## 2016-01-12 ENCOUNTER — Ambulatory Visit (HOSPITAL_BASED_OUTPATIENT_CLINIC_OR_DEPARTMENT_OTHER): Payer: Self-pay

## 2016-01-14 NOTE — Progress Notes (Signed)
This encounter was created in error - please disregard.

## 2016-01-15 ENCOUNTER — Encounter: Payer: Self-pay | Admitting: Cardiology

## 2016-01-23 ENCOUNTER — Ambulatory Visit: Payer: Self-pay | Admitting: Cardiology

## 2016-01-25 ENCOUNTER — Encounter (HOSPITAL_BASED_OUTPATIENT_CLINIC_OR_DEPARTMENT_OTHER): Payer: Self-pay

## 2016-01-26 ENCOUNTER — Ambulatory Visit (INDEPENDENT_AMBULATORY_CARE_PROVIDER_SITE_OTHER): Payer: Self-pay | Admitting: Physician Assistant

## 2016-01-26 ENCOUNTER — Inpatient Hospital Stay (HOSPITAL_COMMUNITY)
Admission: AD | Admit: 2016-01-26 | Discharge: 2016-01-29 | DRG: 291 | Disposition: A | Discharge status: Home / Self Care | Payer: Medicaid Other | Source: Ambulatory Visit | Attending: Cardiology | Admitting: Cardiology

## 2016-01-26 ENCOUNTER — Encounter: Payer: Self-pay | Admitting: Physician Assistant

## 2016-01-26 ENCOUNTER — Encounter (HOSPITAL_COMMUNITY): Payer: Self-pay | Admitting: *Deleted

## 2016-01-26 VITALS — BP 130/100 | HR 84 | Ht 72.0 in | Wt 175.0 lb

## 2016-01-26 DIAGNOSIS — I255 Ischemic cardiomyopathy: Secondary | ICD-10-CM | POA: Diagnosis present

## 2016-01-26 DIAGNOSIS — Z833 Family history of diabetes mellitus: Secondary | ICD-10-CM

## 2016-01-26 DIAGNOSIS — I480 Paroxysmal atrial fibrillation: Secondary | ICD-10-CM | POA: Diagnosis present

## 2016-01-26 DIAGNOSIS — Z720 Tobacco use: Secondary | ICD-10-CM | POA: Insufficient documentation

## 2016-01-26 DIAGNOSIS — I5022 Chronic systolic (congestive) heart failure: Secondary | ICD-10-CM

## 2016-01-26 DIAGNOSIS — E785 Hyperlipidemia, unspecified: Secondary | ICD-10-CM | POA: Diagnosis present

## 2016-01-26 DIAGNOSIS — R0602 Shortness of breath: Secondary | ICD-10-CM | POA: Diagnosis present

## 2016-01-26 DIAGNOSIS — I2510 Atherosclerotic heart disease of native coronary artery without angina pectoris: Secondary | ICD-10-CM | POA: Diagnosis present

## 2016-01-26 DIAGNOSIS — Z7902 Long term (current) use of antithrombotics/antiplatelets: Secondary | ICD-10-CM

## 2016-01-26 DIAGNOSIS — Z8249 Family history of ischemic heart disease and other diseases of the circulatory system: Secondary | ICD-10-CM

## 2016-01-26 DIAGNOSIS — F1410 Cocaine abuse, uncomplicated: Secondary | ICD-10-CM | POA: Diagnosis present

## 2016-01-26 DIAGNOSIS — I5043 Acute on chronic combined systolic (congestive) and diastolic (congestive) heart failure: Secondary | ICD-10-CM

## 2016-01-26 DIAGNOSIS — E1165 Type 2 diabetes mellitus with hyperglycemia: Secondary | ICD-10-CM | POA: Diagnosis present

## 2016-01-26 DIAGNOSIS — I5023 Acute on chronic systolic (congestive) heart failure: Secondary | ICD-10-CM | POA: Diagnosis present

## 2016-01-26 DIAGNOSIS — Z7984 Long term (current) use of oral hypoglycemic drugs: Secondary | ICD-10-CM | POA: Diagnosis not present

## 2016-01-26 DIAGNOSIS — I10 Essential (primary) hypertension: Secondary | ICD-10-CM

## 2016-01-26 DIAGNOSIS — M069 Rheumatoid arthritis, unspecified: Secondary | ICD-10-CM | POA: Diagnosis present

## 2016-01-26 DIAGNOSIS — I272 Other secondary pulmonary hypertension: Secondary | ICD-10-CM | POA: Diagnosis present

## 2016-01-26 DIAGNOSIS — Z9119 Patient's noncompliance with other medical treatment and regimen: Secondary | ICD-10-CM

## 2016-01-26 DIAGNOSIS — K210 Gastro-esophageal reflux disease with esophagitis: Secondary | ICD-10-CM | POA: Diagnosis present

## 2016-01-26 DIAGNOSIS — N189 Chronic kidney disease, unspecified: Secondary | ICD-10-CM | POA: Diagnosis present

## 2016-01-26 DIAGNOSIS — I130 Hypertensive heart and chronic kidney disease with heart failure and stage 1 through stage 4 chronic kidney disease, or unspecified chronic kidney disease: Secondary | ICD-10-CM | POA: Diagnosis present

## 2016-01-26 DIAGNOSIS — Z823 Family history of stroke: Secondary | ICD-10-CM | POA: Diagnosis not present

## 2016-01-26 DIAGNOSIS — F17210 Nicotine dependence, cigarettes, uncomplicated: Secondary | ICD-10-CM | POA: Diagnosis present

## 2016-01-26 DIAGNOSIS — E1122 Type 2 diabetes mellitus with diabetic chronic kidney disease: Secondary | ICD-10-CM | POA: Diagnosis present

## 2016-01-26 DIAGNOSIS — Z803 Family history of malignant neoplasm of breast: Secondary | ICD-10-CM

## 2016-01-26 DIAGNOSIS — I252 Old myocardial infarction: Secondary | ICD-10-CM

## 2016-01-26 DIAGNOSIS — Z800 Family history of malignant neoplasm of digestive organs: Secondary | ICD-10-CM

## 2016-01-26 DIAGNOSIS — N179 Acute kidney failure, unspecified: Secondary | ICD-10-CM | POA: Diagnosis present

## 2016-01-26 DIAGNOSIS — Z7901 Long term (current) use of anticoagulants: Secondary | ICD-10-CM

## 2016-01-26 DIAGNOSIS — Z79899 Other long term (current) drug therapy: Secondary | ICD-10-CM

## 2016-01-26 DIAGNOSIS — Z955 Presence of coronary angioplasty implant and graft: Secondary | ICD-10-CM | POA: Diagnosis not present

## 2016-01-26 LAB — CBC WITH DIFFERENTIAL/PLATELET
Basophils Absolute: 0 10*3/uL (ref 0.0–0.1)
Basophils Relative: 0 %
EOS ABS: 0 10*3/uL (ref 0.0–0.7)
Eosinophils Relative: 0 %
HEMATOCRIT: 43.2 % (ref 39.0–52.0)
HEMOGLOBIN: 14.4 g/dL (ref 13.0–17.0)
LYMPHS ABS: 1.2 10*3/uL (ref 0.7–4.0)
LYMPHS PCT: 12 %
MCH: 29 pg (ref 26.0–34.0)
MCHC: 33.3 g/dL (ref 30.0–36.0)
MCV: 86.9 fL (ref 78.0–100.0)
MONOS PCT: 6 %
Monocytes Absolute: 0.6 10*3/uL (ref 0.1–1.0)
NEUTROS ABS: 8.4 10*3/uL — AB (ref 1.7–7.7)
NEUTROS PCT: 82 %
Platelets: 216 10*3/uL (ref 150–400)
RBC: 4.97 MIL/uL (ref 4.22–5.81)
RDW: 14.5 % (ref 11.5–15.5)
WBC: 10.3 10*3/uL (ref 4.0–10.5)

## 2016-01-26 LAB — GLUCOSE, CAPILLARY
GLUCOSE-CAPILLARY: 546 mg/dL — AB (ref 65–99)
Glucose-Capillary: 387 mg/dL — ABNORMAL HIGH (ref 65–99)
Glucose-Capillary: 567 mg/dL (ref 65–99)
Glucose-Capillary: >600 mg/dL (ref 65–99)

## 2016-01-26 LAB — BASIC METABOLIC PANEL
Anion gap: 12 (ref 5–15)
BUN: 21 mg/dL — AB (ref 6–20)
CALCIUM: 9.1 mg/dL (ref 8.9–10.3)
CO2: 25 mmol/L (ref 22–32)
CREATININE: 1.42 mg/dL — AB (ref 0.61–1.24)
Chloride: 98 mmol/L — ABNORMAL LOW (ref 101–111)
GFR calc Af Amer: >60 mL/min (ref >60)
GFR, EST NON AFRICAN AMERICAN: 55 mL/min — AB (ref >60)
GLUCOSE: 608 mg/dL — AB (ref 65–99)
Potassium: 4.7 mmol/L (ref 3.5–5.1)
Sodium: 135 mmol/L (ref 135–145)

## 2016-01-26 LAB — PROTIME-INR
INR: 1.82 — AB (ref 0.00–1.49)
Prothrombin Time: 21 seconds — ABNORMAL HIGH (ref 11.6–15.2)

## 2016-01-26 LAB — HEPATIC FUNCTION PANEL
ALT: 76 U/L — ABNORMAL HIGH (ref 17–63)
AST: 39 U/L (ref 15–41)
Albumin: 2.8 g/dL — ABNORMAL LOW (ref 3.5–5.0)
Alkaline Phosphatase: 242 U/L — ABNORMAL HIGH (ref 38–126)
BILIRUBIN DIRECT: 0.4 mg/dL (ref 0.1–0.5)
BILIRUBIN INDIRECT: 1.3 mg/dL — AB (ref 0.3–0.9)
BILIRUBIN TOTAL: 1.7 mg/dL — AB (ref 0.3–1.2)
Total Protein: 6.3 g/dL — ABNORMAL LOW (ref 6.5–8.1)

## 2016-01-26 LAB — BRAIN NATRIURETIC PEPTIDE: B NATRIURETIC PEPTIDE 5: 1378.3 pg/mL — AB (ref 0.0–100.0)

## 2016-01-26 LAB — TSH: TSH: 1.554 u[IU]/mL (ref 0.350–4.500)

## 2016-01-26 MED ORDER — AMLODIPINE BESYLATE 5 MG PO TABS
5.0000 mg | ORAL_TABLET | Freq: Every day | ORAL | Status: DC
  Administered 2016-01-27 – 2016-01-29 (×3): 5 mg via ORAL
  Filled 2016-01-26 (×3): qty 1

## 2016-01-26 MED ORDER — FUROSEMIDE 10 MG/ML IJ SOLN
80.0000 mg | Freq: Two times a day (BID) | INTRAMUSCULAR | Status: DC
  Administered 2016-01-26 – 2016-01-28 (×4): 80 mg via INTRAVENOUS
  Filled 2016-01-26 (×5): qty 8

## 2016-01-26 MED ORDER — ALPRAZOLAM 0.25 MG PO TABS
0.2500 mg | ORAL_TABLET | Freq: Two times a day (BID) | ORAL | Status: DC | PRN
  Administered 2016-01-27: 0.25 mg via ORAL
  Filled 2016-01-26: qty 1

## 2016-01-26 MED ORDER — SODIUM CHLORIDE 0.9% FLUSH: 3.0000 mL | INTRAVENOUS | Status: DC | PRN

## 2016-01-26 MED ORDER — INSULIN ASPART 100 UNIT/ML ~~LOC~~ SOLN: 0.0000 [IU] | Freq: Three times a day (TID) | SUBCUTANEOUS | Status: DC

## 2016-01-26 MED ORDER — IPRATROPIUM-ALBUTEROL 0.5-2.5 (3) MG/3ML IN SOLN: 3.0000 mL | Freq: Four times a day (QID) | RESPIRATORY_TRACT | Status: DC | PRN

## 2016-01-26 MED ORDER — SPIRONOLACTONE 25 MG PO TABS
25.0000 mg | ORAL_TABLET | Freq: Two times a day (BID) | ORAL | Status: DC
  Administered 2016-01-26 – 2016-01-29 (×6): 25 mg via ORAL
  Filled 2016-01-26 (×7): qty 1

## 2016-01-26 MED ORDER — DIGOXIN 125 MCG PO TABS
0.1250 mg | ORAL_TABLET | Freq: Every day | ORAL | Status: DC
  Administered 2016-01-27 – 2016-01-29 (×3): 0.125 mg via ORAL
  Filled 2016-01-26 (×3): qty 1

## 2016-01-26 MED ORDER — SODIUM CHLORIDE 0.9 % IV SOLN
INTRAVENOUS | Status: DC
  Administered 2016-01-26: 23:00:00 via INTRAVENOUS

## 2016-01-26 MED ORDER — ATORVASTATIN CALCIUM 80 MG PO TABS
80.0000 mg | ORAL_TABLET | Freq: Every evening | ORAL | Status: DC
  Administered 2016-01-26 – 2016-01-28 (×3): 80 mg via ORAL
  Filled 2016-01-26 (×3): qty 1

## 2016-01-26 MED ORDER — DILTIAZEM HCL ER COATED BEADS 240 MG PO CP24
240.0000 mg | ORAL_CAPSULE | Freq: Every day | ORAL | Status: DC
  Administered 2016-01-27 – 2016-01-29 (×3): 240 mg via ORAL
  Filled 2016-01-26 (×4): qty 1

## 2016-01-26 MED ORDER — CARVEDILOL 12.5 MG PO TABS
12.5000 mg | ORAL_TABLET | Freq: Two times a day (BID) | ORAL | Status: DC
  Administered 2016-01-26 – 2016-01-29 (×6): 12.5 mg via ORAL
  Filled 2016-01-26 (×6): qty 1

## 2016-01-26 MED ORDER — ACETAMINOPHEN 325 MG PO TABS: 650.0000 mg | ORAL_TABLET | ORAL | Status: DC | PRN

## 2016-01-26 MED ORDER — PANTOPRAZOLE SODIUM 40 MG PO TBEC
40.0000 mg | DELAYED_RELEASE_TABLET | Freq: Every day | ORAL | Status: DC
  Administered 2016-01-26 – 2016-01-29 (×4): 40 mg via ORAL
  Filled 2016-01-26 (×4): qty 1

## 2016-01-26 MED ORDER — NITROGLYCERIN 0.4 MG SL SUBL: 0.4000 mg | SUBLINGUAL_TABLET | SUBLINGUAL | Status: DC | PRN

## 2016-01-26 MED ORDER — POTASSIUM CHLORIDE CRYS ER 20 MEQ PO TBCR
20.0000 meq | EXTENDED_RELEASE_TABLET | Freq: Two times a day (BID) | ORAL | Status: DC
  Administered 2016-01-26 – 2016-01-29 (×6): 20 meq via ORAL
  Filled 2016-01-26 (×6): qty 1

## 2016-01-26 MED ORDER — SODIUM CHLORIDE 0.9% FLUSH
3.0000 mL | Freq: Two times a day (BID) | INTRAVENOUS | Status: DC
  Administered 2016-01-26 – 2016-01-29 (×6): 3 mL via INTRAVENOUS

## 2016-01-26 MED ORDER — HYDRALAZINE HCL 25 MG PO TABS
25.0000 mg | ORAL_TABLET | Freq: Three times a day (TID) | ORAL | Status: DC
  Administered 2016-01-26 – 2016-01-29 (×8): 25 mg via ORAL
  Filled 2016-01-26 (×9): qty 1

## 2016-01-26 MED ORDER — TRAMADOL HCL 50 MG PO TABS
50.0000 mg | ORAL_TABLET | Freq: Four times a day (QID) | ORAL | Status: DC | PRN
Start: 2016-01-26 — End: 2016-01-29
  Administered 2016-01-26 – 2016-01-28 (×4): 50 mg via ORAL
  Filled 2016-01-26 (×4): qty 1

## 2016-01-26 MED ORDER — INSULIN REGULAR BOLUS VIA INFUSION
0.0000 [IU] | Freq: Three times a day (TID) | INTRAVENOUS | Status: DC
  Filled 2016-01-26: qty 10

## 2016-01-26 MED ORDER — SODIUM CHLORIDE 0.9 % IV SOLN: 250.0000 mL | INTRAVENOUS | Status: DC | PRN

## 2016-01-26 MED ORDER — SODIUM CHLORIDE 0.9 % IV SOLN
INTRAVENOUS | Status: DC
  Administered 2016-01-26: 4.9 [IU]/h via INTRAVENOUS
  Filled 2016-01-26: qty 2.5

## 2016-01-26 MED ORDER — DEXTROSE 50 % IV SOLN: 25.0000 mL | INTRAVENOUS | Status: DC | PRN

## 2016-01-26 MED ORDER — ISOSORBIDE MONONITRATE ER 30 MG PO TB24
30.0000 mg | ORAL_TABLET | Freq: Every day | ORAL | Status: DC
  Administered 2016-01-27 – 2016-01-29 (×3): 30 mg via ORAL
  Filled 2016-01-26 (×4): qty 1

## 2016-01-26 MED ORDER — GLIPIZIDE 10 MG PO TABS
10.0000 mg | ORAL_TABLET | Freq: Two times a day (BID) | ORAL | Status: DC
  Administered 2016-01-26 – 2016-01-29 (×5): 10 mg via ORAL
  Filled 2016-01-26 (×6): qty 1

## 2016-01-26 MED ORDER — LISINOPRIL 20 MG PO TABS
20.0000 mg | ORAL_TABLET | Freq: Every day | ORAL | Status: DC
  Administered 2016-01-27 – 2016-01-29 (×3): 20 mg via ORAL
  Filled 2016-01-26 (×3): qty 1

## 2016-01-26 MED ORDER — AMIODARONE HCL 200 MG PO TABS
200.0000 mg | ORAL_TABLET | Freq: Every day | ORAL | Status: DC
  Administered 2016-01-27 – 2016-01-29 (×3): 200 mg via ORAL
  Filled 2016-01-26 (×4): qty 1

## 2016-01-26 MED ORDER — ONDANSETRON HCL 4 MG/2ML IJ SOLN: 4.0000 mg | Freq: Four times a day (QID) | INTRAMUSCULAR | Status: DC | PRN

## 2016-01-26 MED ORDER — ALBUTEROL SULFATE (2.5 MG/3ML) 0.083% IN NEBU
2.5000 mg | INHALATION_SOLUTION | Freq: Four times a day (QID) | RESPIRATORY_TRACT | Status: DC | PRN
  Administered 2016-01-27: 2.5 mg via RESPIRATORY_TRACT
  Filled 2016-01-26: qty 3

## 2016-01-26 MED ORDER — CLOPIDOGREL BISULFATE 75 MG PO TABS
75.0000 mg | ORAL_TABLET | Freq: Every day | ORAL | Status: DC
  Administered 2016-01-27 – 2016-01-29 (×3): 75 mg via ORAL
  Filled 2016-01-26 (×3): qty 1

## 2016-01-26 MED ORDER — ZOLPIDEM TARTRATE 5 MG PO TABS
5.0000 mg | ORAL_TABLET | Freq: Every evening | ORAL | Status: DC | PRN
  Administered 2016-01-27 – 2016-01-29 (×2): 5 mg via ORAL
  Filled 2016-01-26 (×2): qty 1

## 2016-01-26 MED ORDER — RIVAROXABAN 20 MG PO TABS
20.0000 mg | ORAL_TABLET | Freq: Every day | ORAL | Status: DC
  Administered 2016-01-27 – 2016-01-29 (×3): 20 mg via ORAL
  Filled 2016-01-26 (×3): qty 1

## 2016-01-26 NOTE — Progress Notes (Signed)
Pt's glucose is > 600 will ask [facility] to consult for diabetes management.

## 2016-01-26 NOTE — Consult Note (Signed)
Reason for Consult: Uncontrolled diabetes. Referring Physician: Ms. Cecilie Kicks. Nursing practitioner.  Sean Hinton is an 55 y.o. male.  HPI: With history of chronic systolic heart failure paroxysmal atrial fibrillation on 02 CAD status post stenting diabetes mellitus type 2 who was admitted for acute exacerbation of his CHF with shortness of breath. Patient states over the last 1 month his blood sugar has been running high and was initially placed on sliding scale coverage 4 weeks ago for which his blood sugar improved less than 200. He has been using Eucerin scale coverage for 2 weeks following which he discontinued. Today on admission is found to have a sugar more than 600 but not in DKA. Patient has shortness of breath which has been ongoing for last few weeks and has been gradually worsening with lower extremity edema for which patient has been admitted for further management with IV Lasix as per the cardiology. Patient denies any chest pain or nausea vomiting abdominal pain or diarrhea. Patient states she has been compliant with his glipizide. He does have some productive cough which has been chronic but has no fever or chills. Chest x-ray was unremarkable. He does have lower extremity edema but has no signs of any infection.  Past Medical History  Diagnosis Date  . Diabetes mellitus with complication ([place])   . Hypertension   . Syncope     a. 02/2015 - felt 2/2 rapid AF/AFL.  Marland Kitchen Atrial fibrillation and flutter ([place])     a. Dx 02/2015 - placed on Xarelto; b. 09/2015 recurrence in setting of PNA.  . Ischemic cardiomyopathy     a. 09/2015 Echo: EF 35-40%.  Marland Kitchen CAD (coronary artery disease)     a. Dx 02/2015 - 2V CAD with CTO LAD, diffuse dz in PDA, OM - med rx.  . Hypertensive heart disease   . Hyperlipidemia   . Mitral regurgitation     a. Mod by echo 02/2015,  . Tobacco abuse   . Chronic systolic CHF (congestive heart failure) ([place])     a. 09/2015 Echo: EF 35-40%, sev apical/antlat, apical  HK, mild MR, mildly dil LA.  Marland Kitchen ARF (acute respiratory failure) ([place])   . Pneumonia 09/2015  . GERD (gastroesophageal reflux disease)   . Headache   . Arthritis     ra  . NSTEMI (non-ST elevated myocardial infarction) ([place]) 10/2015    Past Surgical History  Procedure Laterality Date  . Right thumb surgery     . Cardiac catheterization N/A 02/24/2015    Procedure: Left Heart Cath and Coronary Angiography;  Surgeon: Leonie Man, MD;  Location: [facility] INVASIVE CV LAB CUPID;  Service: Cardiovascular;  Laterality: N/A;  . Cardiac catheterization  02/24/2015    Procedure: Coronary/Bypass Graft CTO Intervention;  Surgeon: Leonie Man, MD;  Location: [facility] INVASIVE CV LAB CUPID;  Service: Cardiovascular;;  . Cardiac catheterization N/A 11/03/2015    Procedure: Left Heart Cath and Coronary Angiography;  Surgeon: Sherren Mocha, MD;  Location: [place] CV LAB;  Service: Cardiovascular;  Laterality: N/A;  . Cardiac catheterization N/A 11/03/2015    Procedure: Coronary Stent Intervention;  Surgeon: Sherren Mocha, MD;  Location: [place] CV LAB;  Service: Cardiovascular;  Laterality: N/A;  RAMUS    Family History  Problem Relation Age of Onset  . Diabetes Mother   . Hypertension Mother   . Heart disease Mother   . Cancer Mother     stomach cancer   . Diabetes Father   . Hypertension Father   .  Heart disease Father   . Diabetes Sister   . Hypertension Sister   . Cancer Sister     breast cancer   . Diabetes Brother   . Hypertension Brother   . Heart disease Brother   . Stroke Brother   . Heart attack Mother   . Heart attack Father   . Heart attack Brother     Social History:  reports that he has been smoking Cigarettes.  He has a 3 pack-year smoking history. He has never used smokeless tobacco. He reports that he uses illicit drugs (Cocaine). He reports that he does not drink alcohol.  Allergies: No Known Allergies  Medications: I have reviewed the patient's current  medications.  Results for orders placed or performed during the hospital encounter of 01/26/16 (from the past 48 hour(s))  Glucose, capillary     Status: Abnormal   Collection Time: 01/26/16  5:04 PM  Result Value Ref Range   Glucose-Capillary >600 (HH) 65 - 99 mg/dL   Comment 1 Notify RN   Basic metabolic panel     Status: Abnormal   Collection Time: 01/26/16  5:56 PM  Result Value Ref Range   Sodium 135 135 - 145 mmol/L   Potassium 4.7 3.5 - 5.1 mmol/L   Chloride 98 (L) 101 - 111 mmol/L   CO2 25 22 - 32 mmol/L   Glucose, Bld 608 (HH) 65 - 99 mg/dL    Comment: CRITICAL RESULT CALLED TO, READ BACK BY AND VERIFIED WITH: L DANG,RN 1928 01/26/16 D BRADLEY    BUN 21 (H) 6 - 20 mg/dL   Creatinine, Ser 1.42 (H) 0.61 - 1.24 mg/dL   Calcium 9.1 8.9 - 10.3 mg/dL   GFR calc non Af Amer 55 (L) >60 mL/min   GFR calc Af Amer >60 >60 mL/min    Comment: (NOTE) The eGFR has been calculated using the CKD EPI equation. This calculation has not been validated in all clinical situations. eGFR's persistently <60 mL/min signify possible Chronic Kidney Disease.    Anion gap 12 5 - 15  Hepatic function panel     Status: Abnormal   Collection Time: 01/26/16  5:56 PM  Result Value Ref Range   Total Protein 6.3 (L) 6.5 - 8.1 g/dL   Albumin 2.8 (L) 3.5 - 5.0 g/dL   AST 39 15 - 41 U/L   ALT 76 (H) 17 - 63 U/L   Alkaline Phosphatase 242 (H) 38 - 126 U/L   Total Bilirubin 1.7 (H) 0.3 - 1.2 mg/dL   Bilirubin, Direct 0.4 0.1 - 0.5 mg/dL   Indirect Bilirubin 1.3 (H) 0.3 - 0.9 mg/dL  Protime-INR     Status: Abnormal   Collection Time: 01/26/16  5:56 PM  Result Value Ref Range   Prothrombin Time 21.0 (H) 11.6 - 15.2 seconds   INR 1.82 (H) 0.00 - 1.49  TSH     Status: None   Collection Time: 01/26/16  5:56 PM  Result Value Ref Range   TSH 1.554 0.350 - 4.500 uIU/mL  CBC with Differential/Platelet     Status: Abnormal   Collection Time: 01/26/16  6:46 PM  Result Value Ref Range   WBC 10.3 4.0 - 10.5  K/uL   RBC 4.97 4.22 - 5.81 MIL/uL   Hemoglobin 14.4 13.0 - 17.0 g/dL   HCT 43.2 39.0 - 52.0 %   MCV 86.9 78.0 - 100.0 fL   MCH 29.0 26.0 - 34.0 pg   MCHC 33.3 30.0 - 36.0  g/dL   RDW 14.5 11.5 - 15.5 %   Platelets 216 150 - 400 K/uL   Neutrophils Relative % 82 %   Neutro Abs 8.4 (H) 1.7 - 7.7 K/uL   Lymphocytes Relative 12 %   Lymphs Abs 1.2 0.7 - 4.0 K/uL   Monocytes Relative 6 %   Monocytes Absolute 0.6 0.1 - 1.0 K/uL   Eosinophils Relative 0 %   Eosinophils Absolute 0.0 0.0 - 0.7 K/uL   Basophils Relative 0 %   Basophils Absolute 0.0 0.0 - 0.1 K/uL  Glucose, capillary     Status: Abnormal   Collection Time: 01/26/16  8:10 PM  Result Value Ref Range   Glucose-Capillary 567 (HH) 65 - 99 mg/dL   Comment 1 Call MD NNP PA CNM     No results found.  Review of Systems  HENT: Negative.   Eyes: Negative.   Respiratory: Positive for cough, sputum production and shortness of breath.   Cardiovascular: Negative.   Gastrointestinal: Negative.   Genitourinary: Negative.   Musculoskeletal:       Edema.  Skin: Negative.   Neurological: Negative.   Psychiatric/Behavioral: Negative.    Blood pressure 137/103, pulse 102, temperature 98.3 F (36.8 C), temperature source Oral, resp. rate 20, height 5' 9"  (1.753 m), weight 171 lb 8 oz (77.792 kg), SpO2 100 %. Physical Exam  Constitutional: He is oriented to person, place, and time. He appears well-developed and well-nourished. No distress.  HENT:  Head: Normocephalic and atraumatic.  Right Ear: External ear normal.  Eyes: Conjunctivae are normal. Pupils are equal, round, and reactive to light. Right eye exhibits no discharge. Left eye exhibits no discharge. No scleral icterus.  Neck: Normal range of motion. Neck supple.  Cardiovascular: Normal rate and regular rhythm.   Respiratory: Effort normal and breath sounds normal. No respiratory distress. He has no wheezes. He has no rales.  GI: Soft. Bowel sounds are normal. He exhibits no  distension. There is no tenderness.  Musculoskeletal: He exhibits edema. He exhibits no tenderness.  Neurological: He is alert and oriented to person, place, and time.  Skin: Skin is warm and dry. He is not diaphoretic.  Psychiatric: His behavior is normal.    Assessment/Plan: #1. Diabetes mellitus type 2 uncontrolled with hyperglycemia not in DKA - since patient's blood sugar is more than 500 persistently have placed patient on IV insulin infusion. Once blood sugar decreases less than 250 I will change to long-acting insulin Lantus 10 units subcutaneous. Patient states that he does have fragile diabetes. Patient states his blood sugar does drop sometimes to lower levels. We will closely monitor her CBGs check hemoglobin A1c. Patient will also be placed on sliding scale coverage sensitive scale months is blood sugar improves with IV insulin therapy. #2. Shortness of breath with CHF systolic with history of paroxysmal atrial fibrillation, hypertension and CAD - per cardiology. #3. Acute renal failure - closely monitor metabolic panel since patient will be on insulin in doses may be adjusted accordingly. If creatinine worsens and may have to hold off glipizide.  Thanks for involving [place] in patient's care we will follow along with you.  Rise Patience. 01/26/2016, 8:15 PM

## 2016-01-26 NOTE — Progress Notes (Addendum)
Cardiology Office Note   Date:  01/26/2016   ID:  Vansh Reckart, DOB [birth year], MRN [id number]  PCP:  Lora Paula, MD  Cardiologist:  Dr Chrisandra Netters, PA-C   Chief Complaint  Patient presents with  . Follow-up    no chest pain, swelling in legs, no cramping, alot of shortness of breath, no dizziness or lightheadedness    History of Present Illness: Kazuto Lewan is a 55 y.o. male with a history of ICM w/ EF 35-40% 09/2015 echo, PAF on Xarelto, HTN, HLD, DM, cocaine hx, tobacco use, CAD w/ CTO LAD, DES RI 10/2015.  Seen in office 03/15 with volume overload 2nd dietary indiscretions, Lasix increased and f/u planned. Wt 174.  Rani Fei presents for f/u of CHF.  Mr Linse has not lost any weight. He states he is compliant with his diuretics but they make him very thirsty.  He estimates that he is drinking a gallon of water a day. He states he is doing better with sodium restriction, but did eat [employer] recently. He also notes that his blood sugars have been very high recently. He has not had chest pain. He has not had palpitations. He states he has not had cocaine in over a week.  He describes dyspnea on exertion and wonders why he is so short of breath. He also describes PND and orthopnea. His lower extremity edema is not improved.  He has some legal issues and needs to go to court today at 1:30. He states he can go to the hospital after that.   Past Medical History  Diagnosis Date  . Diabetes mellitus with complication (HCC)   . Hypertension   . Syncope     a. 02/2015 - felt 2/2 rapid AF/AFL.  Marland Kitchen Atrial fibrillation and flutter (HCC)     a. Dx 02/2015 - placed on Xarelto; b. 09/2015 recurrence in setting of PNA.  . Ischemic cardiomyopathy     a. 09/2015 Echo: EF 35-40%.  Marland Kitchen CAD (coronary artery disease)     a. Dx 02/2015 - 2V CAD with CTO LAD, diffuse dz in PDA, OM - med rx.  . Hypertensive heart disease   . Hyperlipidemia   . Mitral regurgitation       a. Mod by echo 02/2015,  . Tobacco abuse   . Chronic systolic CHF (congestive heart failure) (HCC)     a. 09/2015 Echo: EF 35-40%, sev apical/antlat, apical HK, mild MR, mildly dil LA.  Marland Kitchen ARF (acute respiratory failure) (HCC)   . Pneumonia 09/2015  . GERD (gastroesophageal reflux disease)   . Headache   . Arthritis     ra  . NSTEMI (non-ST elevated myocardial infarction) (HCC) 10/2015    Past Surgical History  Procedure Laterality Date  . Right thumb surgery     . Cardiac catheterization N/A 02/24/2015    Procedure: Left Heart Cath and Coronary Angiography;  Surgeon: Marykay Lex, MD;  Location: [facility] INVASIVE CV LAB CUPID;  Service: Cardiovascular;  Laterality: N/A;  . Cardiac catheterization  02/24/2015    Procedure: Coronary/Bypass Graft CTO Intervention;  Surgeon: Marykay Lex, MD;  Location: [facility] INVASIVE CV LAB CUPID;  Service: Cardiovascular;;  . Cardiac catheterization N/A 11/03/2015    Procedure: Left Heart Cath and Coronary Angiography;  Surgeon: Tonny Bollman, MD;  Location: [facility] INVASIVE CV LAB;  Service: Cardiovascular;  Laterality: N/A;  . Cardiac catheterization N/A 11/03/2015    Procedure: Coronary Stent Intervention;  Surgeon: Tonny Bollman, MD;  Location: [facility] INVASIVE CV LAB;  Service: Cardiovascular;  Laterality: N/A;  RAMUS    Current Outpatient Prescriptions  Medication Sig Dispense Refill  . albuterol (PROVENTIL HFA;VENTOLIN HFA) 108 (90 Base) MCG/ACT inhaler Inhale 2 puffs into the lungs every 6 (six) hours as needed for wheezing or shortness of breath. 1 Inhaler 2  . amiodarone (PACERONE) 200 MG tablet Take 1 tablet (200 mg total) by mouth daily. 30 tablet 0  . amLODipine (NORVASC) 5 MG tablet Take 1 tablet (5 mg total) by mouth daily. 30 tablet 0  . atorvastatin (LIPITOR) 80 MG tablet Take 1 tablet (80 mg total) by mouth every evening. 30 tablet 3  . carvedilol (COREG) 12.5 MG tablet Take 12.5 mg by mouth 2 (two) times daily with a meal.    . clopidogrel (PLAVIX)  75 MG tablet Take 1 tablet (75 mg total) by mouth daily with breakfast. 30 tablet 0  . Dexlansoprazole (DEXILANT) 30 MG capsule Take 1 capsule (30 mg total) by mouth daily. 90 capsule 3  . digoxin (LANOXIN) 0.125 MG tablet Take 0.125 mg by mouth daily.    Marland Kitchen diltiazem (DILACOR XR) 240 MG 24 hr capsule Take 240 mg by mouth daily.    Clinical research associate Bandages & Supports (MEDICAL COMPRESSION STOCKINGS) MISC 20 mm Hg pressure 2 each 3  . furosemide (LASIX) 40 MG tablet TAKE 2 TABLETS BY MOUTH IN THE A.M AND 1 TABLET IN THE PM  X'S 3 DAYS THEN TAKE 1 TABLET BY MOUTH TWICE A DAY 60 tablet 1  . glipiZIDE (GLUCOTROL) 10 MG tablet Take 1 tablet (10 mg total) by mouth 2 (two) times daily before a meal. 60 tablet 5  . hydrALAZINE (APRESOLINE) 25 MG tablet Take 2 tablets (50 mg total) by mouth 3 (three) times daily. 90 tablet 0  . ipratropium-albuterol (DUONEB) 0.5-2.5 (3) MG/3ML SOLN Take 3 mLs by nebulization 2 (two) times daily. 360 mL 10  . isosorbide mononitrate (IMDUR) 30 MG 24 hr tablet Take 1 tablet (30 mg total) by mouth daily. 30 tablet 2  . lisinopril (PRINIVIL,ZESTRIL) 20 MG tablet Take 20 mg by mouth daily.    . nitroGLYCERIN (NITROSTAT) 0.4 MG SL tablet Place 1 tablet (0.4 mg total) under the tongue every 5 (five) minutes as needed for chest pain (up to 3 doses). 25 tablet 3  . potassium chloride SA (K-DUR,KLOR-CON) 20 MEQ tablet TAKE 1 TABLET BY MOUTH TWICE A DAY X'S 3 DAYS THEN TAKE 1 TABLET DAILY 30 tablet 3  . rivaroxaban (XARELTO) 20 MG TABS tablet Take 1 tablet (20 mg total) by mouth daily with supper. (Patient taking differently: Take 20 mg by mouth daily. ) 30 tablet 3  . spironolactone (ALDACTONE) 25 MG tablet Take 1 tablet (25 mg total) by mouth 2 (two) times daily. 60 tablet 0  . traMADol (ULTRAM) 50 MG tablet Take 1 tablet (50 mg total) by mouth every 6 (six) hours as needed for severe pain. 30 tablet 0   No current facility-administered medications for this visit.    Allergies:   Review of  patient's allergies indicates no known allergies.    Social History:  The patient  reports that he has been smoking Cigarettes.  He has a 3 pack-year smoking history. He has never used smokeless tobacco. He reports that he uses illicit drugs (Cocaine). He reports that he does not drink alcohol.   Family History:  The patient's family history includes Cancer in his mother and sister; Diabetes in his brother, father,  mother, and sister; Heart attack in his brother, father, and mother; Heart disease in his brother, father, and mother; Hypertension in his brother, father, mother, and sister; Stroke in his brother.    ROS:  Please see the history of present illness. All other systems are reviewed and negative.    PHYSICAL EXAM: VS:  BP 130/100 mmHg  Pulse 84  Ht 6' (1.829 m)  Wt 175 lb (79.379 kg)  BMI 23.73 kg/m2 , BMI Body mass index is 23.73 kg/(m^2). GEN: Well nourished, well developed, male in mild-moderate respiratory distress HEENT: normal for age  Neck: JVD 9-10 centimeters, no carotid bruit, no masses Cardiac: RRR; high-pitched 2/6 murmur, no rubs, or gallops Respiratory: Decreased breath sounds bases with rails bilaterally, increased work of breathing GI: soft, very tender, nondistended, + BS MS: no deformity or atrophy; one-2+  edema; distal pulses are 2+ in upper extremities, decreased in both lower extremities due to edema  Skin: warm and dry, no rash Neuro:  Strength and sensation are intact  EKG:  EKG is ordered today. The ekg ordered today demonstrates sinus rhythm, possible LVH, T waves are high, similar to 01/07/2016 ECG  ECHO: 11/19/2015 - Left ventricle: The cavity size was mildly dilated. Wall  thickness was increased in a pattern of moderate LVH. There was  focal basal hypertrophy. Systolic function was mildly to  moderately reduced. The estimated ejection fraction was in the  range of 40% to 45%. Akinesis of the mid-apicalapical myocardium. - Mitral valve:  There was severe regurgitation. - Left atrium: The atrium was severely dilated. - Pulmonary arteries: Systolic pressure was moderately increased.  PA peak pressure: 65 mm Hg (S).  CATH: 10/2015  Dist RCA lesion, 50% stenosed.  RPDA lesion, 80% stenosed.  1st RPLB lesion, 70% stenosed.  Mid RCA lesion, 50% stenosed.  Prox LAD lesion, 100% stenosed.  Ramus lesion, 100% stenosed. Post intervention with Synergy 2.75 x 28 mm DES, there is a 0% residual stenosis. 1. Severe 3 vessel CAD with total occlusion of the LAD (chronic), total thrombotic occlusion of the ramus intermedius (acute), and severe diffuse distal vessel disease in the RCA.  2. Severely elevated LVEDP with acute pulmonary edema, improved during the course of the procedure with IV furosemide and NTG and coronary reperfusion Plan - admit to CCU. Consider ASA, clopidogrel, and Xarelto (paroxysmal atrial fibrillation) x 3 months, then stop ASA.  Recent Labs: 11/03/2015: TSH 0.447 12/01/2015: Magnesium 2.0 12/09/2015: B Natriuretic Peptide 795.1* 01/05/2016: ALT 65* 01/06/2016: BUN 23; Creatinine, Ser 1.32; Hemoglobin 14.1; Platelets 208.0; Potassium 3.7; Pro B Natriuretic peptide (BNP) 1491.0*; Sodium 135    Lipid Panel    Component Value Date/Time   CHOL 128 10/13/2015 0726   TRIG 69 11/19/2015 1356   HDL 35* 10/13/2015 0726   CHOLHDL 3.7 10/13/2015 0726   VLDL 13 10/13/2015 0726   LDLCALC 80 10/13/2015 0726     Wt Readings from Last 3 Encounters:  01/26/16 175 lb (79.379 kg)  01/07/16 174 lb (78.926 kg)  01/06/16 175 lb 9.6 oz (79.652 kg)     Other studies Reviewed: Additional studies/ records that were reviewed today include: Hospital records office notes and testing   ASSESSMENT AND PLAN: Mr Bemiss was evaluated by Dr Jens Som  1.  Acute on chronic combined CHF: His weight is up a pound from his previous office visit despite increasing his Lasix. The patient has dietary indiscretions with the amount of water  he is drinking. He occasionally gets too much sodium  also. Increasing his Lasix has not been successful in diuresing him. He feels very short of breath. Admission to the hospital is indicated for IV diuresis. We will monitor his renal function, daily weights, intake/output.   2. Shortness of breath: Shortness of breath seems out of proportion to the volume overload although the volume overload is significant. We will continue Xarelto but if his condition does not improve quickly, he made need a CT scan to rule out PE or other structural causes of shortness of breath.  3. Diabetes: Patient states his blood sugars have not been controlled. We will obtain an internal medicine consult upon his arrival.  4. Polysubstance abuse: Patient states he has cut down smoking and has not done cocaine in a week. We will check a drug screen.  Current medicines are reviewed at length with the patient today.  The patient does not have concerns regarding medicines.  The following changes have been made:  no change yet due to Hospital admission   Labs/ tests ordered today include:   admission   Disposition:   FU with Dr. Royann Shivers after discharge   Signed, Leanna Battles  01/26/2016 8:12 AM    [facility] Phone: [phone]; Fax: [phone]  This note was written with the assistance of speech recognition software. Please excuse any transcriptional errors. As above, patient seen and examined. Briefly he is a 55 year old male with past medical history of ischemic cardiomyopathy, severe mitral regurgitation, paroxysmal atrial fibrillation, hypertension, hyperlipidemia, diabetes mellitus, cocaine use with acute on chronic combined systolic/diastolic congestive heart failure. Note patient is not felt to be a candidate for consideration of mitral valve surgery given compliance issues and drug use. He presents to the clinic today with complaints of increasing dyspnea on exertion,  orthopnea and pedal edema. No chest pain, fevers, palpitations or syncope. On examination he has clear lungs. 3/6 systolic murmur apex. 2+ pedal edema. Patient was seen recently in clinic and his diuretics were increased as an outpatient. However he has not improved. Patient will be admitted and IV diuresis initiated. Follow renal function closely. Continue beta blocker and ACE inhibitor for reduced LV function. Continue Plavix and statin for previous PCI. Follow blood pressure and advance medications as needed. Patient denies recent drug use. Would check urine drug screen. Continue amiodarone and xarelto for h/o PAF.Patient apparently was scheduled to be in court today. Needs fluid and Na restriction. Olga Millers

## 2016-01-26 NOTE — Patient Instructions (Signed)
You will be admitted to [facility] this afternoon. You will receive a phone call when your bed is ready. They will call the number provided [phone] [name]. You will need to arrive at the hospital within 2 hours of receiving the phone call and you will need to arrive to the Admissions office located in the [facility].

## 2016-01-26 NOTE — H&P (Signed)
Philmore Pali, PA-C Physician Assistant Certified Addendum Cardiology Progress Notes 01/26/2016 7:42 AM  Related encounter: Office Visit from 01/26/2016 in [facility] [place]    Expand All Collapse All      Cardiology Office Note   Date: 01/26/2016   ID: Benno Shangraw, DOB [birth year], MRN [id number]  PCP: Lora Paula, MD Cardiologist: Dr Chrisandra Netters, PA-C   Chief Complaint  Patient presents with  . Follow-up    no chest pain, swelling in legs, no cramping, alot of shortness of breath, no dizziness or lightheadedness    History of Present Illness: Advay Creelman is a 55 y.o. male with a history of ICM w/ EF 35-40% 09/2015 echo, PAF on Xarelto, HTN, HLD, DM, cocaine hx, tobacco use, CAD w/ CTO LAD, DES RI 10/2015.  Seen in office 03/15 with volume overload 2nd dietary indiscretions, Lasix increased and f/u planned. Wt 174.  Rulon Halderman presents for f/u of CHF.  Mr Shippee has not lost any weight. He states he is compliant with his diuretics but they make him very thirsty. He estimates that he is drinking a gallon of water a day. He states he is doing better with sodium restriction, but did eat [employer] recently. He also notes that his blood sugars have been very high recently. He has not had chest pain. He has not had palpitations. He states he has not had cocaine in over a week.  He describes dyspnea on exertion and wonders why he is so short of breath. He also describes PND and orthopnea. His lower extremity edema is not improved.  He has some legal issues and needs to go to court today at 1:30. He states he can go to the hospital after that.   Past Medical History  Diagnosis Date  . Diabetes mellitus with complication (HCC)   . Hypertension   . Syncope     a. 02/2015 - felt 2/2 rapid AF/AFL.  Marland Kitchen Atrial fibrillation and flutter (HCC)     a. Dx 02/2015 - placed on Xarelto; b. 09/2015 recurrence in setting of PNA.    . Ischemic cardiomyopathy     a. 09/2015 Echo: EF 35-40%.  Marland Kitchen CAD (coronary artery disease)     a. Dx 02/2015 - 2V CAD with CTO LAD, diffuse dz in PDA, OM - med rx.  . Hypertensive heart disease   . Hyperlipidemia   . Mitral regurgitation     a. Mod by echo 02/2015,  . Tobacco abuse   . Chronic systolic CHF (congestive heart failure) (HCC)     a. 09/2015 Echo: EF 35-40%, sev apical/antlat, apical HK, mild MR, mildly dil LA.  Marland Kitchen ARF (acute respiratory failure) (HCC)   . Pneumonia 09/2015  . GERD (gastroesophageal reflux disease)   . Headache   . Arthritis     ra  . NSTEMI (non-ST elevated myocardial infarction) (HCC) 10/2015    Past Surgical History  Procedure Laterality Date  . Right thumb surgery     . Cardiac catheterization N/A 02/24/2015    Procedure: Left Heart Cath and Coronary Angiography; Surgeon: Marykay Lex, MD; Location: [facility] INVASIVE CV LAB CUPID; Service: Cardiovascular; Laterality: N/A;  . Cardiac catheterization  02/24/2015    Procedure: Coronary/Bypass Graft CTO Intervention; Surgeon: Marykay Lex, MD; Location: [facility] INVASIVE CV LAB CUPID; Service: Cardiovascular;;  . Cardiac catheterization N/A 11/03/2015    Procedure: Left Heart Cath and Coronary Angiography; Surgeon: Tonny Bollman, MD; Location: [facility] INVASIVE CV LAB; Service: Cardiovascular; Laterality: N/A;  .  Cardiac catheterization N/A 11/03/2015    Procedure: Coronary Stent Intervention; Surgeon: Tonny Bollman, MD; Location: [facility] INVASIVE CV LAB; Service: Cardiovascular; Laterality: N/A; RAMUS    Current Outpatient Prescriptions  Medication Sig Dispense Refill  . albuterol (PROVENTIL HFA;VENTOLIN HFA) 108 (90 Base) MCG/ACT inhaler Inhale 2 puffs into the lungs every 6 (six) hours as needed for wheezing or shortness of breath. 1 Inhaler 2  . amiodarone (PACERONE) 200 MG tablet Take 1 tablet (200 mg  total) by mouth daily. 30 tablet 0  . amLODipine (NORVASC) 5 MG tablet Take 1 tablet (5 mg total) by mouth daily. 30 tablet 0  . atorvastatin (LIPITOR) 80 MG tablet Take 1 tablet (80 mg total) by mouth every evening. 30 tablet 3  . carvedilol (COREG) 12.5 MG tablet Take 12.5 mg by mouth 2 (two) times daily with a meal.    . clopidogrel (PLAVIX) 75 MG tablet Take 1 tablet (75 mg total) by mouth daily with breakfast. 30 tablet 0  . Dexlansoprazole (DEXILANT) 30 MG capsule Take 1 capsule (30 mg total) by mouth daily. 90 capsule 3  . digoxin (LANOXIN) 0.125 MG tablet Take 0.125 mg by mouth daily.    Marland Kitchen diltiazem (DILACOR XR) 240 MG 24 hr capsule Take 240 mg by mouth daily.    Clinical research associate Bandages & Supports (MEDICAL COMPRESSION STOCKINGS) MISC 20 mm Hg pressure 2 each 3  . furosemide (LASIX) 40 MG tablet TAKE 2 TABLETS BY MOUTH IN THE A.M AND 1 TABLET IN THE PM X'S 3 DAYS THEN TAKE 1 TABLET BY MOUTH TWICE A DAY 60 tablet 1  . glipiZIDE (GLUCOTROL) 10 MG tablet Take 1 tablet (10 mg total) by mouth 2 (two) times daily before a meal. 60 tablet 5  . hydrALAZINE (APRESOLINE) 25 MG tablet Take 2 tablets (50 mg total) by mouth 3 (three) times daily. 90 tablet 0  . ipratropium-albuterol (DUONEB) 0.5-2.5 (3) MG/3ML SOLN Take 3 mLs by nebulization 2 (two) times daily. 360 mL 10  . isosorbide mononitrate (IMDUR) 30 MG 24 hr tablet Take 1 tablet (30 mg total) by mouth daily. 30 tablet 2  . lisinopril (PRINIVIL,ZESTRIL) 20 MG tablet Take 20 mg by mouth daily.    . nitroGLYCERIN (NITROSTAT) 0.4 MG SL tablet Place 1 tablet (0.4 mg total) under the tongue every 5 (five) minutes as needed for chest pain (up to 3 doses). 25 tablet 3  . potassium chloride SA (K-DUR,KLOR-CON) 20 MEQ tablet TAKE 1 TABLET BY MOUTH TWICE A DAY X'S 3 DAYS THEN TAKE 1 TABLET DAILY 30 tablet 3  . rivaroxaban (XARELTO) 20 MG TABS tablet Take 1 tablet (20 mg total)  by mouth daily with supper. (Patient taking differently: Take 20 mg by mouth daily. ) 30 tablet 3  . spironolactone (ALDACTONE) 25 MG tablet Take 1 tablet (25 mg total) by mouth 2 (two) times daily. 60 tablet 0  . traMADol (ULTRAM) 50 MG tablet Take 1 tablet (50 mg total) by mouth every 6 (six) hours as needed for severe pain. 30 tablet 0   No current facility-administered medications for this visit.    Allergies: Review of patient's allergies indicates no known allergies.    Social History: The patient  reports that he has been smoking Cigarettes. He has a 3 pack-year smoking history. He has never used smokeless tobacco. He reports that he uses illicit drugs (Cocaine). He reports that he does not drink alcohol.   Family History: The patient's family history includes Cancer in his mother and  sister; Diabetes in his brother, father, mother, and sister; Heart attack in his brother, father, and mother; Heart disease in his brother, father, and mother; Hypertension in his brother, father, mother, and sister; Stroke in his brother.    ROS: Please see the history of present illness. All other systems are reviewed and negative.    PHYSICAL EXAM: VS: BP 130/100 mmHg  Pulse 84  Ht 6' (1.829 m)  Wt 175 lb (79.379 kg)  BMI 23.73 kg/m2 , BMI Body mass index is 23.73 kg/(m^2). GEN: Well nourished, well developed, male in mild-moderate respiratory distress  HEENT: normal for age  Neck: JVD 9-10 centimeters, no carotid bruit, no masses Cardiac: RRR; high-pitched 2/6 murmur, no rubs, or gallops Respiratory: Decreased breath sounds bases with rails bilaterally, increased work of breathing GI: soft, very tender, nondistended, + BS MS: no deformity or atrophy; one-2+ edema; distal pulses are 2+ in upper extremities, decreased in both lower extremities due to edema  Skin: warm and dry, no rash Neuro: Strength and sensation are intact  EKG: EKG is ordered today. The ekg  ordered today demonstrates sinus rhythm, possible LVH, T waves are high, similar to 01/07/2016 ECG  ECHO: 11/19/2015 - Left ventricle: The cavity size was mildly dilated. Wall  thickness was increased in a pattern of moderate LVH. There was  focal basal hypertrophy. Systolic function was mildly to  moderately reduced. The estimated ejection fraction was in the  range of 40% to 45%. Akinesis of the mid-apicalapical myocardium. - Mitral valve: There was severe regurgitation. - Left atrium: The atrium was severely dilated. - Pulmonary arteries: Systolic pressure was moderately increased.  PA peak pressure: 65 mm Hg (S).  CATH: 10/2015  Dist RCA lesion, 50% stenosed.  RPDA lesion, 80% stenosed.  1st RPLB lesion, 70% stenosed.  Mid RCA lesion, 50% stenosed.  Prox LAD lesion, 100% stenosed.  Ramus lesion, 100% stenosed. Post intervention with Synergy 2.75 x 28 mm DES, there is a 0% residual stenosis. 1. Severe 3 vessel CAD with total occlusion of the LAD (chronic), total thrombotic occlusion of the ramus intermedius (acute), and severe diffuse distal vessel disease in the RCA.  2. Severely elevated LVEDP with acute pulmonary edema, improved during the course of the procedure with IV furosemide and NTG and coronary reperfusion Plan - admit to CCU. Consider ASA, clopidogrel, and Xarelto (paroxysmal atrial fibrillation) x 3 months, then stop ASA.  Recent Labs: 11/03/2015: TSH 0.447 12/01/2015: Magnesium 2.0 12/09/2015: B Natriuretic Peptide 795.1* 01/05/2016: ALT 65* 01/06/2016: BUN 23; Creatinine, Ser 1.32; Hemoglobin 14.1; Platelets 208.0; Potassium 3.7; Pro B Natriuretic peptide (BNP) 1491.0*; Sodium 135    Lipid Panel  Labs (Brief)       Component Value Date/Time   CHOL 128 10/13/2015 0726   TRIG 69 11/19/2015 1356   HDL 35* 10/13/2015 0726   CHOLHDL 3.7 10/13/2015 0726   VLDL 13 10/13/2015 0726   LDLCALC 80 10/13/2015 0726      Wt Readings  from Last 3 Encounters:  01/26/16 175 lb (79.379 kg)  01/07/16 174 lb (78.926 kg)  01/06/16 175 lb 9.6 oz (79.652 kg)     Other studies Reviewed: Additional studies/ records that were reviewed today include: Hospital records office notes and testing   ASSESSMENT AND PLAN: Mr Stamey was evaluated by Dr Jens Som  1. Acute on chronic combined CHF: His weight is up a pound from his previous office visit despite increasing his Lasix. The patient has dietary indiscretions with the amount of water he  is drinking. He occasionally gets too much sodium also. Increasing his Lasix has not been successful in diuresing him. He feels very short of breath. Admission to the hospital is indicated for IV diuresis. We will monitor his renal function, daily weights, intake/output.  2. Shortness of breath: Shortness of breath seems out of proportion to the volume overload although the volume overload is significant. We will continue Xarelto but if his condition does not improve quickly, he made need a CT scan to rule out PE or other structural causes of shortness of breath.  3. Diabetes: Patient states his blood sugars have not been controlled. We will obtain an internal medicine consult upon his arrival.  4. Polysubstance abuse: Patient states he has cut down smoking and has not done cocaine in a week. We will check a drug screen.  Current medicines are reviewed at length with the patient today. The patient does not have concerns regarding medicines.  The following changes have been made: no change yet due to Hospital admission   Labs/ tests ordered today include:  admission   Disposition: FU with Dr. Royann Shivers after discharge   Signed, Leanna Battles  01/26/2016 8:12 AM  [facility] Phone: [phone]; Fax: [phone]  This note was written with the assistance of speech recognition software. Please excuse any transcriptional errors. As above,  patient seen and examined. Briefly he is a 55 year old male with past medical history of ischemic cardiomyopathy, severe mitral regurgitation, paroxysmal atrial fibrillation, hypertension, hyperlipidemia, diabetes mellitus, cocaine use with acute on chronic combined systolic/diastolic congestive heart failure. Note patient is not felt to be a candidate for consideration of mitral valve surgery given compliance issues and drug use. He presents to the clinic today with complaints of increasing dyspnea on exertion, orthopnea and pedal edema. No chest pain, fevers, palpitations or syncope. On examination he has clear lungs. 3/6 systolic murmur apex. 2+ pedal edema. Patient was seen recently in clinic and his diuretics were increased as an outpatient. However he has not improved. Patient will be admitted and IV diuresis initiated. Follow renal function closely. Continue beta blocker and ACE inhibitor for reduced LV function. Continue Plavix and statin for previous PCI. Follow blood pressure and advance medications as needed. Patient denies recent drug use. Would check urine drug screen. Continue amiodarone and xarelto for h/o PAF.Patient apparently was scheduled to be in court today. Needs fluid and Na restriction. Olga Millers

## 2016-01-26 NOTE — Progress Notes (Addendum)
Patient arrived to room 3E01. Alert and Oriented x4. Oriented to room and call bell light. Able to ambulate. Denies pain at this time. Telemetry placed and called. Orders released and implemented. Hydration provided. Call bell within reach.   1937  CRITICAL VALUE ALERT  Critical value received:  Blood Glucose 608  Date of notification:  01/26/16   Time of notification:  1936  Critical value read back:Yes.    Nurse who received alert:  Billy Fischer   MD notified (1st page):  Nada Boozer, NP   Time of first page:  1937  Responding MD:  Nada Boozer   Time MD responded:  [phone]  Per NP, NP will notify [facility] to manage Blood glucose. Will report to oncoming nurse.

## 2016-01-26 NOTE — Addendum Note (Signed)
Addended by: Lewayne Bunting on: 01/26/2016 11:19 AM   Modules accepted: Level of Service

## 2016-01-27 ENCOUNTER — Inpatient Hospital Stay (HOSPITAL_COMMUNITY): Payer: Medicaid Other

## 2016-01-27 DIAGNOSIS — N179 Acute kidney failure, unspecified: Secondary | ICD-10-CM | POA: Insufficient documentation

## 2016-01-27 DIAGNOSIS — Z720 Tobacco use: Secondary | ICD-10-CM | POA: Insufficient documentation

## 2016-01-27 DIAGNOSIS — F1410 Cocaine abuse, uncomplicated: Secondary | ICD-10-CM

## 2016-01-27 DIAGNOSIS — I480 Paroxysmal atrial fibrillation: Secondary | ICD-10-CM

## 2016-01-27 DIAGNOSIS — R0602 Shortness of breath: Secondary | ICD-10-CM | POA: Insufficient documentation

## 2016-01-27 DIAGNOSIS — N189 Chronic kidney disease, unspecified: Secondary | ICD-10-CM

## 2016-01-27 DIAGNOSIS — I272 Pulmonary hypertension, unspecified: Secondary | ICD-10-CM | POA: Insufficient documentation

## 2016-01-27 LAB — RAPID URINE DRUG SCREEN, HOSP PERFORMED
Amphetamines: NOT DETECTED
Barbiturates: NOT DETECTED
Benzodiazepines: NOT DETECTED
Cocaine: POSITIVE — AB
Opiates: NOT DETECTED
Tetrahydrocannabinol: NOT DETECTED

## 2016-01-27 LAB — BASIC METABOLIC PANEL
ANION GAP: 12 (ref 5–15)
BUN: 25 mg/dL — ABNORMAL HIGH (ref 6–20)
CHLORIDE: 99 mmol/L — AB (ref 101–111)
CO2: 22 mmol/L (ref 22–32)
Calcium: 8.3 mg/dL — ABNORMAL LOW (ref 8.9–10.3)
Creatinine, Ser: 1.32 mg/dL — ABNORMAL HIGH (ref 0.61–1.24)
GFR calc non Af Amer: >60 mL/min (ref >60)
Glucose, Bld: 233 mg/dL — ABNORMAL HIGH (ref 65–99)
Potassium: 3.9 mmol/L (ref 3.5–5.1)
SODIUM: 133 mmol/L — AB (ref 135–145)

## 2016-01-27 LAB — GLUCOSE, CAPILLARY
GLUCOSE-CAPILLARY: 137 mg/dL — AB (ref 65–99)
Glucose-Capillary: 214 mg/dL — ABNORMAL HIGH (ref 65–99)
Glucose-Capillary: 261 mg/dL — ABNORMAL HIGH (ref 65–99)
Glucose-Capillary: 266 mg/dL — ABNORMAL HIGH (ref 65–99)
Glucose-Capillary: 434 mg/dL — ABNORMAL HIGH (ref 65–99)

## 2016-01-27 LAB — HEMOGLOBIN A1C
HEMOGLOBIN A1C: 13.4 % — AB (ref 4.8–5.6)
MEAN PLASMA GLUCOSE: 338 mg/dL

## 2016-01-27 MED ORDER — INSULIN GLARGINE 100 UNIT/ML ~~LOC~~ SOLN
15.0000 [IU] | Freq: Every day | SUBCUTANEOUS | Status: DC
  Administered 2016-01-27: 15 [IU] via SUBCUTANEOUS
  Filled 2016-01-27 (×2): qty 0.15

## 2016-01-27 MED ORDER — INSULIN ASPART 100 UNIT/ML ~~LOC~~ SOLN
14.0000 [IU] | Freq: Once | SUBCUTANEOUS | Status: AC
  Administered 2016-01-27: 14 [IU] via SUBCUTANEOUS

## 2016-01-27 MED ORDER — INSULIN ASPART 100 UNIT/ML ~~LOC~~ SOLN
0.0000 [IU] | SUBCUTANEOUS | Status: DC
  Administered 2016-01-27: 5 [IU] via SUBCUTANEOUS
  Administered 2016-01-27: 2 [IU] via SUBCUTANEOUS
  Administered 2016-01-28: 5 [IU] via SUBCUTANEOUS
  Administered 2016-01-28: 11 [IU] via SUBCUTANEOUS
  Administered 2016-01-28: 2 [IU] via SUBCUTANEOUS
  Administered 2016-01-29 (×2): 11 [IU] via SUBCUTANEOUS

## 2016-01-27 MED ORDER — INSULIN ASPART 100 UNIT/ML ~~LOC~~ SOLN
0.0000 [IU] | Freq: Three times a day (TID) | SUBCUTANEOUS | Status: DC
  Administered 2016-01-27: 5 [IU] via SUBCUTANEOUS

## 2016-01-27 MED ORDER — INSULIN GLARGINE 100 UNIT/ML ~~LOC~~ SOLN
10.0000 [IU] | Freq: Every day | SUBCUTANEOUS | Status: DC
  Administered 2016-01-27: 10 [IU] via SUBCUTANEOUS
  Filled 2016-01-27 (×2): qty 0.1

## 2016-01-27 MED ORDER — INSULIN ASPART 100 UNIT/ML ~~LOC~~ SOLN
8.0000 [IU] | Freq: Three times a day (TID) | SUBCUTANEOUS | Status: DC
  Administered 2016-01-28 – 2016-01-29 (×3): 8 [IU] via SUBCUTANEOUS

## 2016-01-27 NOTE — Progress Notes (Signed)
Inpatient Diabetes Program Recommendations  AACE/ADA: New Consensus Statement on Inpatient Glycemic Control (2015)  Target Ranges:  Prepandial:   less than 140 mg/dL      Peak postprandial:   less than 180 mg/dL (1-2 hours)      Critically ill patients:  140 - 180 mg/dL   Review of Glycemic Control  Inpatient Diabetes Program Recommendations:  Insulin - Basal: Increase Lantus to 20 units   Thank you  Jun Osment BSN, RN,CDE Inpatient Diabetes Coordinator [phone] (team pager)     

## 2016-01-27 NOTE — Discharge Instructions (Addendum)
°  Heart Failure Prevention Plan:  1. Avoid salt  2. Limit daily fluid intake to < 2L  3. Weigh yourself every morning, call cardiology if weight increase by more than 3 lbs overnight or 5 lbs in a single week.      Information on my medicine - XARELTO (Rivaroxaban)  This medication education was reviewed with me or my healthcare representative as part of my discharge preparation.  The pharmacist that spoke with me during my hospital stay was:  Fredrik Rigger, [facility]  Why was Xarelto prescribed for you? Xarelto was prescribed for you to reduce the risk of a blood clot forming that can cause a stroke if you have a medical condition called atrial fibrillation (a type of irregular heartbeat).  What do you need to know about xarelto ? Take your Xarelto ONCE DAILY at the same time every day with your evening meal. If you have difficulty swallowing the tablet whole, you may crush it and mix in applesauce just prior to taking your dose.  Take Xarelto exactly as prescribed by your doctor and DO NOT stop taking Xarelto without talking to the doctor who prescribed the medication.  Stopping without other stroke prevention medication to take the place of Xarelto may increase your risk of developing a clot that causes a stroke.  Refill your prescription before you run out.  After discharge, you should have regular check-up appointments with your healthcare provider that is prescribing your Xarelto.  In the future your dose may need to be changed if your kidney function or weight changes by a significant amount.  What do you do if you miss a dose? If you are taking Xarelto ONCE DAILY and you miss a dose, take it as soon as you remember on the same day then continue your regularly scheduled once daily regimen the next day. Do not take two doses of Xarelto at the same time or on the same day.   Important Safety Information A possible side effect of Xarelto is bleeding. You should call  your healthcare provider right away if you experience any of the following: ? Bleeding from an injury or your nose that does not stop. ? Unusual colored urine (red or dark brown) or unusual colored stools (red or black). ? Unusual bruising for unknown reasons. ? A serious fall or if you hit your head (even if there is no bleeding).  Some medicines may interact with Xarelto and might increase your risk of bleeding while on Xarelto. To help avoid this, consult your healthcare provider or pharmacist prior to using any new prescription or non-prescription medications, including herbals, vitamins, non-steroidal anti-inflammatory drugs (NSAIDs) and supplements.  This website has more information on Xarelto: VisitDestination.com.br.

## 2016-01-27 NOTE — Progress Notes (Signed)
Patient alarmed 8 beats SVT.  Patient asymptomatic in the room.  Theodore Demark, PA made aware.  Will continue to monitor.

## 2016-01-27 NOTE — Progress Notes (Signed)
Patient Name: Sean Hinton Date of Encounter: 01/27/2016  Principal Problem:   Acute on chronic systolic CHF (congestive heart failure) (HCC) Active Problems:   Type 2 diabetes mellitus with hyperglycemia [facility])   Primary Cardiologist: Dr Royann Shivers Patient Profile: 55 y.o. male with a history of ICM w/ EF 35-40% 09/2015 echo, PAF on Xarelto, HTN, HLD, DM, cocaine hx, tobacco use, CAD w/ CTO LAD, DES RI 10/2015. Admitted 04/03 w/ CHF, hyperglycemia w/ uncontrolled DM.  SUBJECTIVE: Breathing is getting better, no chest pain. Abd has been tender since reflux was bad a year ago. Dexilant helped sx, but belly still tender.   OBJECTIVE Filed Vitals:   01/26/16 2036 01/27/16 0637 01/27/16 0829 01/27/16 1251  BP: 131/108 119/91 108/95   Pulse: 93 79 78   Temp: 98.6 F (37 C) 98.6 F (37 C) 96.3 F (35.7 C)   TempSrc: Oral Oral Oral   Resp: 20 20 20    Height:      Weight:  171 lb 6.4 oz (77.747 kg)    SpO2: 100% 97% 100% 97%    Intake/Output Summary (Last 24 hours) at 01/27/16 1258 Last data filed at 01/27/16 1234  Gross per 24 hour  Intake 568.88 ml  Output   4050 ml  Net -3481.12 ml   Filed Weights   01/26/16 1657 01/27/16 0637  Weight: 171 lb 8 oz (77.792 kg) 171 lb 6.4 oz (77.747 kg)    PHYSICAL EXAM General: Well developed, well nourished, male in no acute distress. Head: Normocephalic, atraumatic.  Neck: Supple without bruits, JVD 8-9 cm. Lungs:  Resp regular and unlabored, decreased BS bases w/ rales. Heart: RRR, S1, S2, no S3, S4, or murmur; no rub. Abdomen: Soft, non-tender, non-distended, BS + x 4.  Extremities: No clubbing, cyanosis, L>R edema.  Neuro: Alert and oriented X 3. Moves all extremities spontaneously. Psych: Normal affect.  LABS: CBC:  Recent Labs  01/26/16 1846  WBC 10.3  NEUTROABS 8.4*  HGB 14.4  HCT 43.2  MCV 86.9  PLT 216   INR:  Recent Labs  01/26/16 1756  INR 1.82*   Basic Metabolic Panel:  Recent Labs   01/26/16 1756 01/27/16 0328  NA 135 133*  K 4.7 3.9  CL 98* 99*  CO2 25 22  GLUCOSE 608* 233*  BUN 21* 25*  CREATININE 1.42* 1.32*  CALCIUM 9.1 8.3*   Liver Function Tests:  Recent Labs  01/26/16 1756  AST 39  ALT 76*  ALKPHOS 242*  BILITOT 1.7*  PROT 6.3*  ALBUMIN 2.8*   BNP:  B NATRIURETIC PEPTIDE  Date/Time Value Ref Range Status  01/26/2016 06:48 PM 1378.3* 0.0 - 100.0 pg/mL Final  12/09/2015 09:06 AM 795.1* 0.0 - 100.0 pg/mL Final   Hemoglobin A1C:  Recent Labs  01/26/16 1847  HGBA1C 13.4*   Thyroid Function Tests:  Recent Labs  01/26/16 1756  TSH 1.554   TELE: SR, PVCs     Radiology/Studies: Pending  Current Medications:  . amiodarone  200 mg Oral Daily  . amLODipine  5 mg Oral Daily  . atorvastatin  80 mg Oral QPM  . carvedilol  12.5 mg Oral BID WC  . clopidogrel  75 mg Oral Q breakfast  . digoxin  0.125 mg Oral Daily  . diltiazem  240 mg Oral Daily  . furosemide  80 mg Intravenous BID  . glipiZIDE  10 mg Oral BID AC  . hydrALAZINE  25 mg Oral TID  . insulin aspart  0-9  Units Subcutaneous TID WC  . insulin glargine  10 Units Subcutaneous QHS  . insulin regular  0-10 Units Intravenous TID WC  . isosorbide mononitrate  30 mg Oral Daily  . lisinopril  20 mg Oral Daily  . pantoprazole  40 mg Oral Daily  . potassium chloride SA  20 mEq Oral BID  . rivaroxaban  20 mg Oral Daily  . sodium chloride flush  3 mL Intravenous Q12H  . spironolactone  25 mg Oral BID      ASSESSMENT AND PLAN:  Principal Problem: 1.  Acute on chronic systolic CHF (congestive heart failure) (HCC) - pt sx improving, continue IV Lasix for now. - continue to follow I/O, BMET, daily weights - reinforced diet compliance w/ pt and girlfriend Sean Hinton) - will ck CXR  Active Problems: 2.  Type 2 diabetes mellitus with hyperglycemia (HCC) - IM seeing and managing. - pt has not been diet complaint - with his drug hx, reluctant to start insulin  3. Hx polysubstance  abuse - ck drug screen   Signed, Theodore Demark , PA-C 12:58 PM 01/27/2016  The patient was seen, examined and discussed with Theodore Demark, PA-C and I agree with the above.   55 y.o. male with a history of ICM w/ EF 35-40% 09/2015 echo, PAF on Xarelto, HTN, HLD, DM, cocaine hx, tobacco use, CAD w/ CTO LAD, DES RI 10/2015. Admitted 04/03 w/ CHF, hyperglycemia w/ uncontrolled DM. Admitted yesterday with acute on chronic systolic CHF sec to cocaine abuse, and acute on chronic kidney failure, he responded very well with negative 3.4 L overnight, still significantly fluid overloaded, I would continue iv diuresis for now, Crea is improving 1.4 --> 1.3.  Glucose better controlled. LFTs elevated possibly sec to CHF.   Lars Masson 01/27/2016

## 2016-01-27 NOTE — Consult Note (Signed)
[facility] Medical Consultation  Absalom Aro MLY:[id number] DOB: 08-May-1961 DOA: 01/26/2016 PCP: Lora Paula, MD   Requesting physician: Ms. Nada Boozer. Nursing practitioner. Date of consultation: 01/27/2016 Reason for consultation: Multiple medical problems  Impression/Recommendations Active Problems:   Acute on chronic systolic CHF (congestive heart failure) (HCC)   CHF (congestive heart failure) (HCC)   Type 2 diabetes mellitus with hyperglycemia (HCC)   Diabetes mellitus type 2 uncontrolled with complications  -4/3 hemoglobin A1c= 13.4 -hyperglycemia not in DKA -Increase Lantus to 15 units daily -Start NovoLog 8 units QAC -Moderate SSI -Glipizide 10 mg BID -Patient should be discharged on insulin in order to bring his hemoglobin A1c under control as quickly as possible  Substance abuse (cocaine) -Patient's UDS positive cocaine; spoke at length with patient and wife concerning for sequela of continuing to use cocaine to include massive MI, renal failure, stroke, DEATH  Cardiomyopathy/MV regurgitation -Per cardiology  Pulmonary hypertension -Per cardiology  Acute renal failure  - Improving   I will followup again tomorrow. Please contact me if I can be of assistance in the meanwhile. Thank you for this consultation.  Chief Complaint:   HPI:  55 y.o. BM PMHx Substance Abuse (cocaine), Chronic Systolic CHF, Proxysmal Atrial Fibrillation/Flutter, Ischemic Cardiomyopathy, CAD native artery S/Pstenting, MV Regurgitation HTN, NSTEMI, HLD, DM type 2 with complications   who was admitted for acute exacerbation of his CHF with shortness of breath. Patient states over the last 1 month his blood sugar has been running high and was initially placed on sliding scale coverage 4 weeks ago for which his blood sugar improved less than 200. He has been using Eucerin scale coverage for 2 weeks following which he discontinued. Today on admission is found to have a sugar  more than 600 but not in DKA. Patient has shortness of breath which has been ongoing for last few weeks and has been gradually worsening with lower extremity edema for which patient has been admitted for further management with IV Lasix as per the cardiology. Patient denies any chest pain or nausea vomiting abdominal pain or diarrhea. Patient states she has been compliant with his glipizide. He does have some productive cough which has been chronic but has no fever or chills. Chest x-ray was unremarkable. He does have lower extremity edema but has no signs of any infection.    Review of symptoms The patient denies anorexia, fever, weight loss,, vision loss, decreased hearing, hoarseness, chest pain, syncope, dyspnea on exertion, peripheral edema, balance deficits, hemoptysis, abdominal pain, melena, hematochezia, severe indigestion/heartburn, hematuria, incontinence, genital sores, muscle weakness, suspicious skin lesions, transient blindness, difficulty walking, depression, unusual weight change, abnormal bleeding, enlarged lymph nodes, angioedema, and breast masses.   Consultants: Dr.Katarina San Morelle cardiology  Procedure/Significant Events: 1/25 echocardiogram; - Left ventricle: Moderate LVH. -LVEF= 40% to 45%. Akinesis of the mid-apicalapical myocardium.- Mitral valve:  severe regurgitation.-- Left atrium: severely dilated. - Pulmonary arteries: PA peak pressure: 65 mm Hg (S).   Culture  NA  Antibiotics:  NA DVT prophylaxis:    Past Medical History  Diagnosis Date  . Diabetes mellitus with complication (HCC)   . Hypertension   . Syncope     a. 02/2015 - felt 2/2 rapid AF/AFL.  Marland Kitchen Atrial fibrillation and flutter (HCC)     a. Dx 02/2015 - placed on Xarelto; b. 09/2015 recurrence in setting of PNA.  . Ischemic cardiomyopathy     a. 09/2015 Echo: EF 35-40%.  Marland Kitchen CAD (coronary artery disease)  a. Dx 02/2015 - 2V CAD with CTO LAD, diffuse dz in PDA, OM - med rx.  . Hypertensive  heart disease   . Hyperlipidemia   . Mitral regurgitation     a. Mod by echo 02/2015,  . Tobacco abuse   . Chronic systolic CHF (congestive heart failure) (HCC)     a. 09/2015 Echo: EF 35-40%, sev apical/antlat, apical HK, mild MR, mildly dil LA.  Marland Kitchen ARF (acute respiratory failure) (HCC)   . Pneumonia 09/2015  . GERD (gastroesophageal reflux disease)   . Headache   . Arthritis     ra  . NSTEMI (non-ST elevated myocardial infarction) (HCC) 10/2015   Past Surgical History  Procedure Laterality Date  . Right thumb surgery     . Cardiac catheterization N/A 02/24/2015    Procedure: Left Heart Cath and Coronary Angiography;  Surgeon: Marykay Lex, MD;  Location: [facility] INVASIVE CV LAB CUPID;  Service: Cardiovascular;  Laterality: N/A;  . Cardiac catheterization  02/24/2015    Procedure: Coronary/Bypass Graft CTO Intervention;  Surgeon: Marykay Lex, MD;  Location: [facility] INVASIVE CV LAB CUPID;  Service: Cardiovascular;;  . Cardiac catheterization N/A 11/03/2015    Procedure: Left Heart Cath and Coronary Angiography;  Surgeon: Tonny Bollman, MD;  Location: [facility] INVASIVE CV LAB;  Service: Cardiovascular;  Laterality: N/A;  . Cardiac catheterization N/A 11/03/2015    Procedure: Coronary Stent Intervention;  Surgeon: Tonny Bollman, MD;  Location: [facility] INVASIVE CV LAB;  Service: Cardiovascular;  Laterality: N/A;  RAMUS   Social History:  reports that he has been smoking Cigarettes.  He has a 3 pack-year smoking history. He has never used smokeless tobacco. He reports that he uses illicit drugs (Cocaine). He reports that he does not drink alcohol.  No Known Allergies Family History  Problem Relation Age of Onset  . Diabetes Mother   . Hypertension Mother   . Heart disease Mother   . Cancer Mother     stomach cancer   . Diabetes Father   . Hypertension Father   . Heart disease Father   . Diabetes Sister   . Hypertension Sister   . Cancer Sister     breast cancer   . Diabetes Brother   .  Hypertension Brother   . Heart disease Brother   . Stroke Brother   . Heart attack Mother   . Heart attack Father   . Heart attack Brother     Prior to Admission medications   Medication Sig Start Date End Date Taking? Authorizing Provider  albuterol (PROVENTIL HFA;VENTOLIN HFA) 108 (90 Base) MCG/ACT inhaler Inhale 2 puffs into the lungs every 6 (six) hours as needed for wheezing or shortness of breath. 12/05/15  Yes Jaclyn Shaggy, MD  amiodarone (PACERONE) 200 MG tablet Take 1 tablet (200 mg total) by mouth daily. 12/01/15  Yes Maryann Mikhail, DO  amLODipine (NORVASC) 5 MG tablet Take 1 tablet (5 mg total) by mouth daily. 12/01/15  Yes Rolly Salter, MD  atorvastatin (LIPITOR) 80 MG tablet Take 1 tablet (80 mg total) by mouth every evening. 10/17/15  Yes Ripudeep Jenna Luo, MD  carvedilol (COREG) 12.5 MG tablet Take 12.5 mg by mouth 2 (two) times daily with a meal.   Yes Historical Provider, MD  clopidogrel (PLAVIX) 75 MG tablet Take 1 tablet (75 mg total) by mouth daily with breakfast. 11/06/15  Yes Joseph Art, DO  Dexlansoprazole (DEXILANT) 30 MG capsule Take 1 capsule (30 mg total) by mouth daily. 12/30/14  Yes Dessa Phi, MD  digoxin (LANOXIN) 0.125 MG tablet Take 0.125 mg by mouth daily.   Yes Historical Provider, MD  diltiazem (DILACOR XR) 240 MG 24 hr capsule Take 240 mg by mouth daily.   Yes Historical Provider, MD  Elastic Bandages & Supports (MEDICAL COMPRESSION STOCKINGS) MISC 20 mm Hg pressure 01/05/16  Yes Josalyn Funches, MD  furosemide (LASIX) 40 MG tablet TAKE 2 TABLETS BY MOUTH IN THE A.M AND 1 TABLET IN THE PM  X'S 3 DAYS THEN TAKE 1 TABLET BY MOUTH TWICE A DAY Patient taking differently: Take 120 mg by mouth 2 (two) times daily. TAKE 2 TABLETS BY MOUTH IN THE A.M AND 1 TABLET IN THE PM  X'S 3 DAYS THEN TAKE 1 TABLET BY MOUTH TWICE A DAY 01/07/16  Yes Kelle Darting Hager, PA-C  glipiZIDE (GLUCOTROL) 10 MG tablet Take 1 tablet (10 mg total) by mouth 2 (two) times daily before a meal.  01/05/16  Yes Josalyn Funches, MD  hydrALAZINE (APRESOLINE) 50 MG tablet Take 50 mg by mouth 3 (three) times daily.   Yes Historical Provider, MD  ipratropium-albuterol (DUONEB) 0.5-2.5 (3) MG/3ML SOLN Take 3 mLs by nebulization 2 (two) times daily. 10/17/15  Yes Ripudeep Jenna Luo, MD  isosorbide mononitrate (IMDUR) 30 MG 24 hr tablet Take 1 tablet (30 mg total) by mouth daily. 11/11/15  Yes Jaclyn Shaggy, MD  lisinopril (PRINIVIL,ZESTRIL) 20 MG tablet Take 20 mg by mouth daily.   Yes Historical Provider, MD  potassium chloride SA (K-DUR,KLOR-CON) 20 MEQ tablet TAKE 1 TABLET BY MOUTH TWICE A DAY X'S 3 DAYS THEN TAKE 1 TABLET DAILY 01/07/16  Yes Dwana Melena, PA-C  rivaroxaban (XARELTO) 20 MG TABS tablet Take 1 tablet (20 mg total) by mouth daily with supper. Patient taking differently: Take 20 mg by mouth daily.  02/25/15  Yes Dayna N Dunn, PA-C  spironolactone (ALDACTONE) 25 MG tablet Take 1 tablet (25 mg total) by mouth 2 (two) times daily. 01/05/16  Yes Josalyn Funches, MD  traMADol (ULTRAM) 50 MG tablet Take 1 tablet (50 mg total) by mouth every 6 (six) hours as needed for severe pain. 11/25/15  Yes Jaclyn Shaggy, MD  nitroGLYCERIN (NITROSTAT) 0.4 MG SL tablet Place 1 tablet (0.4 mg total) under the tongue every 5 (five) minutes as needed for chest pain (up to 3 doses). 02/25/15   Laurann Montana, PA-C   Physical Exam: Blood pressure 108/95, pulse 78, temperature 96.3 F (35.7 C), temperature source Oral, resp. rate 20, height 5\' 9"  (1.753 m), weight 77.747 kg (171 lb 6.4 oz), SpO2 100 %. Filed Vitals:   01/26/16 1657 01/26/16 2036 01/27/16 0637 01/27/16 0829  BP: 137/103 131/108 119/91 108/95  Pulse: 102 93 79 78  Temp: 98.3 F (36.8 C) 98.6 F (37 C) 98.6 F (37 C) 96.3 F (35.7 C)  TempSrc: Oral Oral Oral Oral  Resp: 20 20 20 20   Height: 5\' 9"  (1.753 m)     Weight: 77.792 kg (171 lb 8 oz)  77.747 kg (171 lb 6.4 oz)   SpO2: 100% 100% 97% 100%     General: A/O 4, positive acute respiratory  distress Eyes: Negative headache, eye pain, double vision,negative scleral hemorrhage ENT: Negative Runny nose, negative ear pain, negative gingival bleeding, Neck:  Negative scars, masses, torticollis, lymphadenopathy, JVD Lungs: Clear to auscultation bilaterally without wheezes or crackles Cardiovascular: Regular rate and rhythm without murmur gallop or rub normal S1 and S2 Abdomen:negative abdominal pain, nondistended, positive soft, bowel sounds,  no rebound, no ascites, no appreciable mass Extremities: No significant cyanosis, clubbing, or edema bilateral lower extremities Psychiatric:  Negative depression, negative anxiety, negative fatigue, negative mania  Neurologic:  Cranial nerves II through XII intact, tongue/uvula midline, all extremities muscle strength 5/5, sensation intact throughout, negative dysarthria, negative expressive aphasia, negative receptive aphasia.   Labs on Admission:  Basic Metabolic Panel:  Recent Labs Lab 01/26/16 1756 01/27/16 0328  NA 135 133*  K 4.7 3.9  CL 98* 99*  CO2 25 22  GLUCOSE 608* 233*  BUN 21* 25*  CREATININE 1.42* 1.32*  CALCIUM 9.1 8.3*   Liver Function Tests:  Recent Labs Lab 01/26/16 1756  AST 39  ALT 76*  ALKPHOS 242*  BILITOT 1.7*  PROT 6.3*  ALBUMIN 2.8*   No results for input(s): LIPASE, AMYLASE in the last 168 hours. No results for input(s): AMMONIA in the last 168 hours. CBC:  Recent Labs Lab 01/26/16 1846  WBC 10.3  NEUTROABS 8.4*  HGB 14.4  HCT 43.2  MCV 86.9  PLT 216   Cardiac Enzymes: No results for input(s): CKTOTAL, CKMB, CKMBINDEX, TROPONINI in the last 168 hours. BNP: Invalid input(s): POCBNP CBG:  Recent Labs Lab 01/26/16 2010 01/26/16 2224 01/26/16 2347 01/27/16 0049 01/27/16 0640  GLUCAP 567* 546* 387* 261* 266*    Radiological Exams on Admission: No results found.  EKG:   Care during the described time interval was provided by me .  I have reviewed this patient's available  data, including medical history, events of note, physical examination, and all test results as part of my evaluation. I have personally reviewed and interpreted all radiology studies.  Time spent: 40 minutes  Drema Dallas [facility] Pager [phone]  If 7PM-7AM, please contact night-coverage [email or web address] Password [facility] 01/27/2016, 8:57 AM

## 2016-01-28 ENCOUNTER — Encounter: Payer: Self-pay | Admitting: *Deleted

## 2016-01-28 DIAGNOSIS — Z006 Encounter for examination for normal comparison and control in clinical research program: Secondary | ICD-10-CM

## 2016-01-28 DIAGNOSIS — I429 Cardiomyopathy, unspecified: Secondary | ICD-10-CM

## 2016-01-28 DIAGNOSIS — I272 Other secondary pulmonary hypertension: Secondary | ICD-10-CM

## 2016-01-28 DIAGNOSIS — E1165 Type 2 diabetes mellitus with hyperglycemia: Secondary | ICD-10-CM

## 2016-01-28 DIAGNOSIS — Z720 Tobacco use: Secondary | ICD-10-CM

## 2016-01-28 LAB — COMPREHENSIVE METABOLIC PANEL
ALK PHOS: 171 U/L — AB (ref 38–126)
ALT: 55 U/L (ref 17–63)
AST: 33 U/L (ref 15–41)
Albumin: 2.1 g/dL — ABNORMAL LOW (ref 3.5–5.0)
Anion gap: 10 (ref 5–15)
BUN: 27 mg/dL — ABNORMAL HIGH (ref 6–20)
CALCIUM: 8.4 mg/dL — AB (ref 8.9–10.3)
CO2: 26 mmol/L (ref 22–32)
CREATININE: 1.37 mg/dL — AB (ref 0.61–1.24)
Chloride: 104 mmol/L (ref 101–111)
GFR calc non Af Amer: 58 mL/min — ABNORMAL LOW (ref >60)
Glucose, Bld: 226 mg/dL — ABNORMAL HIGH (ref 65–99)
Potassium: 3.4 mmol/L — ABNORMAL LOW (ref 3.5–5.1)
SODIUM: 140 mmol/L (ref 135–145)
Total Bilirubin: 0.8 mg/dL (ref 0.3–1.2)
Total Protein: 4.7 g/dL — ABNORMAL LOW (ref 6.5–8.1)

## 2016-01-28 LAB — GLUCOSE, CAPILLARY
GLUCOSE-CAPILLARY: 87 mg/dL (ref 65–99)
GLUCOSE-CAPILLARY: 309 mg/dL — AB (ref 65–99)
GLUCOSE-CAPILLARY: 117 mg/dL — AB (ref 65–99)
GLUCOSE-CAPILLARY: 59 mg/dL — AB (ref 65–99)
GLUCOSE-CAPILLARY: 70 mg/dL (ref 65–99)
Glucose-Capillary: 145 mg/dL — ABNORMAL HIGH (ref 65–99)
Glucose-Capillary: 234 mg/dL — ABNORMAL HIGH (ref 65–99)

## 2016-01-28 LAB — MAGNESIUM: Magnesium: 1.6 mg/dL — ABNORMAL LOW (ref 1.7–2.4)

## 2016-01-28 MED ORDER — INSULIN GLARGINE 100 UNIT/ML ~~LOC~~ SOLN
20.0000 [IU] | Freq: Every day | SUBCUTANEOUS | Status: DC
  Filled 2016-01-28 (×2): qty 0.2

## 2016-01-28 MED ORDER — LIVING WELL WITH DIABETES BOOK
Freq: Once | Status: AC
  Administered 2016-01-28: 13:00:00
  Filled 2016-01-28: qty 1

## 2016-01-28 MED ORDER — INSULIN STARTER KIT- SYRINGES (ENGLISH)
1.0000 | Freq: Once | Status: AC
  Administered 2016-01-28: 1
  Filled 2016-01-28: qty 1

## 2016-01-28 MED ORDER — FUROSEMIDE 80 MG PO TABS
80.0000 mg | ORAL_TABLET | Freq: Two times a day (BID) | ORAL | Status: DC
  Administered 2016-01-28 – 2016-01-29 (×2): 80 mg via ORAL
  Filled 2016-01-28 (×2): qty 1

## 2016-01-28 NOTE — Progress Notes (Signed)
  RD consulted for nutrition education regarding diabetes.   Lab Results  Component Value Date   HGBA1C 13.4* 01/26/2016    RD provided "Type 2 Diabetes Nutrition Therapy" handout from the Academy of [employer]. Discussed different food groups and their effects on blood sugar, emphasizing carbohydrate-containing foods. Provided list of carbohydrates and recommended serving sizes of common foods. Pt states that he already knows about which foods have carbohydrates and which foods do not. He admits that he needs to decrease his portions of potatoes, corn, pasta, and peas.   Discussed importance of controlled and consistent carbohydrate intake throughout the day. Provided examples of ways to balance meals/snacks and encouraged intake of high-fiber, whole grain complex carbohydrates. Discouraged intake of sweetened beverages and juice. Briefly reviewed sodium restriction guidelines. Pt reports snacking on fruit between meals. RD reviewed portion sizes of fruit and provided low carbohydrate snack list. Teach back method used.  Expect good compliance.  Body mass index is 24.52 kg/(m^2). Pt meets criteria for Normal Weight based on current BMI.  Current diet order is 2 Gram Sodium, patient is consuming approximately 100% of meals at this time. Labs and medications reviewed. No further nutrition interventions warranted at this time. RD contact information provided. If additional nutrition issues arise, please re-consult RD.  Dorothea Ogle RD, LDN Inpatient Clinical Dietitian Pager: [phone] After Hours Pager: [phone]

## 2016-01-28 NOTE — Progress Notes (Signed)
[email or web address]  Informed Consent   Subject Name: Sean Hinton  Subject met inclusion and exclusion criteria. The informed consent form, study requirements and expectations were reviewed with the subject and questions and concerns were addressed prior to the signing of the consent form. The subject verbalized understanding of the trail requirements. The subject agreed to participate in the [email or web address]  trial and signed the informed consent. The informed consent was obtained prior to performance of any protocol-specific procedures for the subject. A copy of the signed informed consent was given to the subject and a copy was placed in the subject's medical record.  [name] 01/28/2016 1430

## 2016-01-28 NOTE — Consult Note (Signed)
[facility] Medical Consultation  Sean Hinton CWC:[id number] DOB: 05-Jun-1961 DOA: 01/26/2016 PCP: Lora Paula, MD   Requesting physician: Ms. Nada Boozer. Nursing practitioner. Date of consultation: 01/27/2016 Reason for consultation: uncontrolled blood sugar  Impression/Recommendations Principal Problem:   Acute on chronic systolic CHF (congestive heart failure) (HCC) Active Problems:   Type 2 diabetes mellitus with hyperglycemia (HCC)   SOB (shortness of breath)   Tobacco abuse   Acute renal failure (HCC)   Pulmonary hypertension (HCC)   Cardiomyopathy (HCC)   Diabetes mellitus type 2 uncontrolled with complications  -4/3 hemoglobin A1c= 13.4 -hyperglycemia not in DKA -Increase Lantus to 20 units daily -continued NovoLog 8 units QAC -Moderate SSI -Glipizide 10 mg BID -Patient should be discharged on insulin in order to bring his hemoglobin A1c under control as quickly as possible, this is discussed with the patient and his wife in room, they agree with the plan, patient also has been drinking excessive soft drinks, dietician consulted for diet education Diabetes coordination consulted  As well   Substance abuse (cocaine) -Patient's UDS positive cocaine; spoke at length with patient and wife concerning for sequela of continuing to use cocaine to include massive MI, renal failure, stroke, DEATH  Cardiomyopathy/MV regurgitation -Per cardiology  Pulmonary hypertension -Per cardiology  Acute renal failure  - Improving   I will followup again tomorrow. Please contact me if I can be of assistance in the meanwhile. Thank you for this consultation.  Chief Complaint:   HPI:  55 y.o. BM PMHx Substance Abuse (cocaine), Chronic Systolic CHF, Proxysmal Atrial Fibrillation/Flutter, Ischemic Cardiomyopathy, CAD native artery S/Pstenting, MV Regurgitation HTN, NSTEMI, HLD, DM type 2 with complications   who was admitted for acute exacerbation of his CHF with  shortness of breath. Patient states over the last 1 month his blood sugar has been running high and was initially placed on sliding scale coverage 4 weeks ago for which his blood sugar improved less than 200. He has been using Eucerin scale coverage for 2 weeks following which he discontinued. Today on admission is found to have a sugar more than 600 but not in DKA. Patient has shortness of breath which has been ongoing for last few weeks and has been gradually worsening with lower extremity edema for which patient has been admitted for further management with IV Lasix as per the cardiology. Patient denies any chest pain or nausea vomiting abdominal pain or diarrhea. Patient states she has been compliant with his glipizide. He does have some productive cough which has been chronic but has no fever or chills. Chest x-ray was unremarkable. He does have lower extremity edema but has no signs of any infection.    Review of symptoms The patient denies anorexia, fever, weight loss,, vision loss, decreased hearing, hoarseness, chest pain, syncope, dyspnea on exertion, peripheral edema, balance deficits, hemoptysis, abdominal pain, melena, hematochezia, severe indigestion/heartburn, hematuria, incontinence, genital sores, muscle weakness, suspicious skin lesions, transient blindness, difficulty walking, depression, unusual weight change, abnormal bleeding, enlarged lymph nodes, angioedema, and breast masses.   Consultants: Dr.Katarina San Morelle cardiology primary hospitalist as consultant for uncontrolled blood sugar  Procedure/Significant Events: 1/25 echocardiogram; - Left ventricle: Moderate LVH. -LVEF= 40% to 45%. Akinesis of the mid-apicalapical myocardium.- Mitral valve:  severe regurgitation.-- Left atrium: severely dilated. - Pulmonary arteries: PA peak pressure: 65 mm Hg (S).   Culture  NA  Antibiotics:  NA DVT prophylaxis: On xarelto   Past Medical History  Diagnosis Date  . Diabetes  mellitus with complication (HCC)   .  Hypertension   . Syncope     a. 02/2015 - felt 2/2 rapid AF/AFL.  Marland Kitchen Atrial fibrillation and flutter (HCC)     a. Dx 02/2015 - placed on Xarelto; b. 09/2015 recurrence in setting of PNA.  . Ischemic cardiomyopathy     a. 09/2015 Echo: EF 35-40%.  Marland Kitchen CAD (coronary artery disease)     a. Dx 02/2015 - 2V CAD with CTO LAD, diffuse dz in PDA, OM - med rx.  . Hypertensive heart disease   . Hyperlipidemia   . Mitral regurgitation     a. Mod by echo 02/2015,  . Tobacco abuse   . Chronic systolic CHF (congestive heart failure) (HCC)     a. 09/2015 Echo: EF 35-40%, sev apical/antlat, apical HK, mild MR, mildly dil LA.  Marland Kitchen ARF (acute respiratory failure) (HCC)   . Pneumonia 09/2015  . GERD (gastroesophageal reflux disease)   . Headache   . Arthritis     ra  . NSTEMI (non-ST elevated myocardial infarction) (HCC) 10/2015   Past Surgical History  Procedure Laterality Date  . Right thumb surgery     . Cardiac catheterization N/A 02/24/2015    Procedure: Left Heart Cath and Coronary Angiography;  Surgeon: Marykay Lex, MD;  Location: [facility] INVASIVE CV LAB CUPID;  Service: Cardiovascular;  Laterality: N/A;  . Cardiac catheterization  02/24/2015    Procedure: Coronary/Bypass Graft CTO Intervention;  Surgeon: Marykay Lex, MD;  Location: [facility] INVASIVE CV LAB CUPID;  Service: Cardiovascular;;  . Cardiac catheterization N/A 11/03/2015    Procedure: Left Heart Cath and Coronary Angiography;  Surgeon: Tonny Bollman, MD;  Location: [facility] INVASIVE CV LAB;  Service: Cardiovascular;  Laterality: N/A;  . Cardiac catheterization N/A 11/03/2015    Procedure: Coronary Stent Intervention;  Surgeon: Tonny Bollman, MD;  Location: [facility] INVASIVE CV LAB;  Service: Cardiovascular;  Laterality: N/A;  RAMUS   Social History:  reports that he has been smoking Cigarettes.  He has a 3 pack-year smoking history. He has never used smokeless tobacco. He reports that he uses illicit drugs (Cocaine). He  reports that he does not drink alcohol.  No Known Allergies Family History  Problem Relation Age of Onset  . Diabetes Mother   . Hypertension Mother   . Heart disease Mother   . Cancer Mother     stomach cancer   . Diabetes Father   . Hypertension Father   . Heart disease Father   . Diabetes Sister   . Hypertension Sister   . Cancer Sister     breast cancer   . Diabetes Brother   . Hypertension Brother   . Heart disease Brother   . Stroke Brother   . Heart attack Mother   . Heart attack Father   . Heart attack Brother     Prior to Admission medications   Medication Sig Start Date End Date Taking? Authorizing Provider  albuterol (PROVENTIL HFA;VENTOLIN HFA) 108 (90 Base) MCG/ACT inhaler Inhale 2 puffs into the lungs every 6 (six) hours as needed for wheezing or shortness of breath. 12/05/15  Yes Jaclyn Shaggy, MD  amiodarone (PACERONE) 200 MG tablet Take 1 tablet (200 mg total) by mouth daily. 12/01/15  Yes Maryann Mikhail, DO  amLODipine (NORVASC) 5 MG tablet Take 1 tablet (5 mg total) by mouth daily. 12/01/15  Yes Rolly Salter, MD  atorvastatin (LIPITOR) 80 MG tablet Take 1 tablet (80 mg total) by mouth every evening. 10/17/15  Yes Ripudeep Jenna Luo, MD  carvedilol (COREG) 12.5 MG tablet Take 12.5 mg by mouth 2 (two) times daily with a meal.   Yes Historical Provider, MD  clopidogrel (PLAVIX) 75 MG tablet Take 1 tablet (75 mg total) by mouth daily with breakfast. 11/06/15  Yes Joseph Art, DO  Dexlansoprazole (DEXILANT) 30 MG capsule Take 1 capsule (30 mg total) by mouth daily. 12/30/14  Yes Josalyn Funches, MD  digoxin (LANOXIN) 0.125 MG tablet Take 0.125 mg by mouth daily.   Yes Historical Provider, MD  diltiazem (DILACOR XR) 240 MG 24 hr capsule Take 240 mg by mouth daily.   Yes Historical Provider, MD  Elastic Bandages & Supports (MEDICAL COMPRESSION STOCKINGS) MISC 20 mm Hg pressure 01/05/16  Yes Josalyn Funches, MD  furosemide (LASIX) 40 MG tablet TAKE 2 TABLETS BY MOUTH IN THE  A.M AND 1 TABLET IN THE PM  X'S 3 DAYS THEN TAKE 1 TABLET BY MOUTH TWICE A DAY Patient taking differently: Take 120 mg by mouth 2 (two) times daily. TAKE 2 TABLETS BY MOUTH IN THE A.M AND 1 TABLET IN THE PM  X'S 3 DAYS THEN TAKE 1 TABLET BY MOUTH TWICE A DAY 01/07/16  Yes Kelle Darting Hager, PA-C  glipiZIDE (GLUCOTROL) 10 MG tablet Take 1 tablet (10 mg total) by mouth 2 (two) times daily before a meal. 01/05/16  Yes Josalyn Funches, MD  hydrALAZINE (APRESOLINE) 50 MG tablet Take 50 mg by mouth 3 (three) times daily.   Yes Historical Provider, MD  ipratropium-albuterol (DUONEB) 0.5-2.5 (3) MG/3ML SOLN Take 3 mLs by nebulization 2 (two) times daily. 10/17/15  Yes Ripudeep Jenna Luo, MD  isosorbide mononitrate (IMDUR) 30 MG 24 hr tablet Take 1 tablet (30 mg total) by mouth daily. 11/11/15  Yes Jaclyn Shaggy, MD  lisinopril (PRINIVIL,ZESTRIL) 20 MG tablet Take 20 mg by mouth daily.   Yes Historical Provider, MD  potassium chloride SA (K-DUR,KLOR-CON) 20 MEQ tablet TAKE 1 TABLET BY MOUTH TWICE A DAY X'S 3 DAYS THEN TAKE 1 TABLET DAILY 01/07/16  Yes Dwana Melena, PA-C  rivaroxaban (XARELTO) 20 MG TABS tablet Take 1 tablet (20 mg total) by mouth daily with supper. Patient taking differently: Take 20 mg by mouth daily.  02/25/15  Yes Dayna N Dunn, PA-C  spironolactone (ALDACTONE) 25 MG tablet Take 1 tablet (25 mg total) by mouth 2 (two) times daily. 01/05/16  Yes Josalyn Funches, MD  traMADol (ULTRAM) 50 MG tablet Take 1 tablet (50 mg total) by mouth every 6 (six) hours as needed for severe pain. 11/25/15  Yes Jaclyn Shaggy, MD  nitroGLYCERIN (NITROSTAT) 0.4 MG SL tablet Place 1 tablet (0.4 mg total) under the tongue every 5 (five) minutes as needed for chest pain (up to 3 doses). 02/25/15   Laurann Montana, PA-C   Physical Exam: Blood pressure 94/71, pulse 75, temperature 98.4 F (36.9 C), temperature source Oral, resp. rate 16, height 5\' 9"  (1.753 m), weight 75.342 kg (166 lb 1.6 oz), SpO2 100 %. Filed Vitals:   01/27/16 1659  01/27/16 2013 01/28/16 0426 01/28/16 0902  BP: 107/72 100/68 108/86 94/71  Pulse: 75 71 74 75  Temp: 98.6 F (37 C) 97.9 F (36.6 C) 98.4 F (36.9 C)   TempSrc: Oral Oral Oral   Resp: 18 18 18 16   Height:      Weight:   75.342 kg (166 lb 1.6 oz)   SpO2: 97% 96% 100% 100%     General: A/O 4, positive acute respiratory distress Eyes: Negative headache, eye pain,  double vision,negative scleral hemorrhage ENT: Negative Runny nose, negative ear pain, negative gingival bleeding, Neck:  Negative scars, masses, torticollis, lymphadenopathy, JVD Lungs: Clear to auscultation bilaterally without wheezes or crackles Cardiovascular: Regular rate and rhythm without murmur gallop or rub normal S1 and S2 Abdomen:negative abdominal pain, nondistended, positive soft, bowel sounds, no rebound, no ascites, no appreciable mass Extremities: No significant cyanosis, clubbing, subsideing edema bilateral lower extremities Psychiatric:  Negative depression, negative anxiety, negative fatigue, negative mania  Neurologic:  Cranial nerves II through XII intact, tongue/uvula midline, all extremities muscle strength 5/5, sensation intact throughout, negative dysarthria, negative expressive aphasia, negative receptive aphasia.   Labs on Admission:  Basic Metabolic Panel:  Recent Labs Lab 01/26/16 1756 01/27/16 0328 01/28/16 0218  NA 135 133* 140  K 4.7 3.9 3.4*  CL 98* 99* 104  CO2 25 22 26   GLUCOSE 608* 233* 226*  BUN 21* 25* 27*  CREATININE 1.42* 1.32* 1.37*  CALCIUM 9.1 8.3* 8.4*  MG  --   --  1.6*   Liver Function Tests:  Recent Labs Lab 01/26/16 1756 01/28/16 0218  AST 39 33  ALT 76* 55  ALKPHOS 242* 171*  BILITOT 1.7* 0.8  PROT 6.3* 4.7*  ALBUMIN 2.8* 2.1*   No results for input(s): LIPASE, AMYLASE in the last 168 hours. No results for input(s): AMMONIA in the last 168 hours. CBC:  Recent Labs Lab 01/26/16 1846  WBC 10.3  NEUTROABS 8.4*  HGB 14.4  HCT 43.2  MCV 86.9  PLT  216   Cardiac Enzymes: No results for input(s): CKTOTAL, CKMB, CKMBINDEX, TROPONINI in the last 168 hours. BNP: Invalid input(s): POCBNP CBG:  Recent Labs Lab 01/27/16 1658 01/27/16 2045 01/28/16 0033 01/28/16 0415 01/28/16 0812  GLUCAP 137* 214* 234* 145* 87    Radiological Exams on Admission: Dg Chest Port 1 View  01/27/2016  CLINICAL DATA:  Short of breath EXAM: PORTABLE CHEST 1 VIEW COMPARISON:  12/09/2015 FINDINGS: Upper normal heart size. Focal patchy density in the right mid lung zone persists. Small right pleural effusion has developed. No pneumothorax. Patchy bibasilar airspace opacities have improved. IMPRESSION: Patchy bibasilar airspace opacities have improved. Patchy right midlung opacity persists. Continued followup until complete resolution is recommended. If the abnormality fails to resolve, underlying nodule cannot be excluded. Electronically Signed   By: Jolaine Click M.D.   On: 01/27/2016 13:31      Time spent: 25 minutes  Tynesia Harral MD PhD [facility] Pager [phone]  If 7PM-7AM, please contact night-coverage [email or web address] Password [email or web address] 01/28/2016, 11:46 AM

## 2016-01-28 NOTE — Hospital Discharge Follow-Up (Signed)
The patient is known to the [facility] and has been followed at the [facility] ( [facility]). Met with the patient and his fiance, Elmyra Ricks, was present. The patient stated that he wishes to continue to follow up a the [facility]. He has is prescriptions filled at [facility] and noted that he will need some refills for prescriptions when he is discharged. Instructed him to notify his doctor of the medications that are needed.  He said that he will be starting on insulin and Elmyra Ricks stated that she takes insulin and will be able to assist him if needed.  He stated that he just found out this week that his medicaid application was denied. He has submitted an applications for disability and is waiting for a decision. Elmyra Ricks stated that she has hired an Forensic psychologist for him to assist with reviewing the medicaid decision and possible resubmission of the medicaid application.  He does receive $194/month in food stamps. Encouraged him to follow through with the financial counselor at the [facility] for the orange card/[facility] financial assistance applications.   He denied any problems with transportation to the clinic.  An appointment was scheduled at [facility] for 02/03/16 @ 1030 and the information was placed on the AVS. Update provided to Olga Coaster, RN CM

## 2016-01-28 NOTE — Progress Notes (Signed)
Patient Name: Sean Hinton Date of Encounter: 01/28/2016  Principal Problem:   Acute on chronic systolic CHF (congestive heart failure) (HCC) Active Problems:   Type 2 diabetes mellitus with hyperglycemia (HCC)   SOB (shortness of breath)   Tobacco abuse   Acute renal failure (HCC)   Pulmonary hypertension (HCC)   Cardiomyopathy [facility])   Primary Cardiologist: Dr Sean Hinton Patient Profile: 55 y.o. male with a history of ICM w/ EF 35-40% 09/2015 echo, PAF on Xarelto, HTN, HLD, DM, cocaine hx, tobacco use, CAD w/ CTO LAD, DES RI 10/2015. Admitted 04/03 w/ CHF, hyperglycemia w/ uncontrolled DM.  SUBJECTIVE: Breathing is getting better, no chest pain. Abd has been tender since reflux was bad a year ago. Dexilant helped sx, but belly still tender.   OBJECTIVE Filed Vitals:   01/27/16 1659 01/27/16 2013 01/28/16 0426 01/28/16 0902  BP: 107/72 100/68 108/86 94/71  Pulse: 75 71 74 75  Temp: 98.6 F (37 C) 97.9 F (36.6 C) 98.4 F (36.9 C)   TempSrc: Oral Oral Oral   Resp: 18 18 18 16   Height:      Weight:   166 lb 1.6 oz (75.342 kg)   SpO2: 97% 96% 100% 100%    Intake/Output Summary (Last 24 hours) at 01/28/16 1120 Last data filed at 01/28/16 0841  Gross per 24 hour  Intake    600 ml  Output   2750 ml  Net  -2150 ml   Filed Weights   01/26/16 1657 01/27/16 0637 01/28/16 0426  Weight: 171 lb 8 oz (77.792 kg) 171 lb 6.4 oz (77.747 kg) 166 lb 1.6 oz (75.342 kg)    PHYSICAL EXAM General: Well developed, well nourished, male in no acute distress. Head: Normocephalic, atraumatic.  Neck: Supple without bruits, JVD 8-9 cm. Lungs:  Resp regular and unlabored, decreased BS bases w/ rales. Heart: RRR, S1, S2, no S3, S4, or murmur; no rub. Abdomen: Soft, non-tender, non-distended, BS + x 4.  Extremities: No clubbing, cyanosis, L>R edema.  Neuro: Alert and oriented X 3. Moves all extremities spontaneously. Psych: Normal affect.  LABS: CBC:  Recent Labs  01/26/16 1846    WBC 10.3  NEUTROABS 8.4*  HGB 14.4  HCT 43.2  MCV 86.9  PLT 216   INR:  Recent Labs  01/26/16 1756  INR 1.82*   Basic Metabolic Panel:  Recent Labs  03/27/16 0328 01/28/16 0218  NA 133* 140  K 3.9 3.4*  CL 99* 104  CO2 22 26  GLUCOSE 233* 226*  BUN 25* 27*  CREATININE 1.32* 1.37*  CALCIUM 8.3* 8.4*  MG  --  1.6*   Liver Function Tests:  Recent Labs  01/26/16 1756 01/28/16 0218  AST 39 33  ALT 76* 55  ALKPHOS 242* 171*  BILITOT 1.7* 0.8  PROT 6.3* 4.7*  ALBUMIN 2.8* 2.1*   BNP:  B NATRIURETIC PEPTIDE  Date/Time Value Ref Range Status  01/26/2016 06:48 PM 1378.3* 0.0 - 100.0 pg/mL Final  12/09/2015 09:06 AM 795.1* 0.0 - 100.0 pg/mL Final   Hemoglobin A1C:  Recent Labs  01/26/16 1847  HGBA1C 13.4*   Thyroid Function Tests:  Recent Labs  01/26/16 1756  TSH 1.554   TELE: SR, PVCs     Radiology/Studies: Pending  Current Medications:  . amiodarone  200 mg Oral Daily  . amLODipine  5 mg Oral Daily  . atorvastatin  80 mg Oral QPM  . carvedilol  12.5 mg Oral BID WC  . clopidogrel  75 mg Oral Q breakfast  . digoxin  0.125 mg Oral Daily  . diltiazem  240 mg Oral Daily  . furosemide  80 mg Intravenous BID  . glipiZIDE  10 mg Oral BID AC  . hydrALAZINE  25 mg Oral TID  . insulin aspart  0-15 Units Subcutaneous 6 times per day  . insulin aspart  8 Units Subcutaneous TID WC  . insulin glargine  15 Units Subcutaneous QHS  . isosorbide mononitrate  30 mg Oral Daily  . lisinopril  20 mg Oral Daily  . pantoprazole  40 mg Oral Daily  . potassium chloride SA  20 mEq Oral BID  . rivaroxaban  20 mg Oral Daily  . sodium chloride flush  3 mL Intravenous Q12H  . spironolactone  25 mg Oral BID      ASSESSMENT AND PLAN:  Principal Problem: 1.  Acute on chronic systolic CHF (congestive heart failure) (HCC) - pt sx improving, continue IV Lasix for now. - continue to follow I/O, BMET, daily weights - reinforced diet compliance w/ pt and girlfriend  Sean Hinton) - will ck CXR  Active Problems: 2.  Type 2 diabetes mellitus with hyperglycemia (HCC) - IM seeing and managing. - pt has not been diet complaint - with his drug hx, reluctant to start insulin  3. Hx polysubstance abuse - tested positive for cocaine again, he denies it and asking why is he in CHF again  55 y.o. male with a history of ICM w/ EF 35-40% 09/2015 echo, PAF on Xarelto, HTN, HLD, DM, cocaine hx, tobacco use, CAD w/ CTO LAD, DES RI 10/2015. Admitted 04/03 w/ CHF, hyperglycemia w/ uncontrolled DM. Admitted on 4/3 with acute on chronic systolic CHF sec to cocaine abuse, and acute on chronic kidney failure, he responded very well with negative 5 L in 2 days, he is now minimally fluid overloaded, almost at his baseline weight 165 lbs, we will swich to PO lasix tonight and plan for discharge tomorrow if he tolerates it.  Crea is improving 1.4 --> 1.3.  Glucose better controlled. LFTs are improving, most probably sec CHF.   Sean Hinton 01/28/2016

## 2016-01-28 NOTE — Progress Notes (Addendum)
Inpatient Diabetes Program Recommendations  AACE/ADA: New Consensus Statement on Inpatient Glycemic Control (2015)  Target Ranges:  Prepandial:   less than 140 mg/dL      Peak postprandial:   less than 180 mg/dL (1-2 hours)      Critically ill patients:  140 - 180 mg/dL   Review of Glycemic Control This coordinator met with patient to discuss insulin use at home.  Pt is aware he needs insulin at this point and states he is familiar with giving insulin bc he gave it to his mother.  Also, significant other takes insulin.  Pt able to describe s/s of hypoglycemia and how to treat.  Reviewed the 15:15 rule.  Discussed basic carb counting and gave meal planning guide.  No questions/concerns at the end of our visit.   Inpatient Diabetes Program Recommendations:  Insulin - Basal: Decrease Lantus to 15 units bc FBS was 87  Change Novolog to TID + HS scale per Glycemic Control order-set FBS decreased significantly this morning after Lantus 15 units given last pm Text page sent to Dr. Erlinda Hong with recommendation. Thank you  Raoul Pitch BSN, RN,CDE Inpatient Diabetes Coordinator [phone] (team pager)

## 2016-01-29 LAB — BASIC METABOLIC PANEL
ANION GAP: 12 (ref 5–15)
BUN: 30 mg/dL — ABNORMAL HIGH (ref 6–20)
CALCIUM: 8.5 mg/dL — AB (ref 8.9–10.3)
CO2: 22 mmol/L (ref 22–32)
Chloride: 101 mmol/L (ref 101–111)
Creatinine, Ser: 1.51 mg/dL — ABNORMAL HIGH (ref 0.61–1.24)
GFR, EST AFRICAN AMERICAN: 60 mL/min — AB (ref >60)
GFR, EST NON AFRICAN AMERICAN: 51 mL/min — AB (ref >60)
Glucose, Bld: 369 mg/dL — ABNORMAL HIGH (ref 65–99)
POTASSIUM: 4.3 mmol/L (ref 3.5–5.1)
SODIUM: 135 mmol/L (ref 135–145)

## 2016-01-29 LAB — GLUCOSE, CAPILLARY
GLUCOSE-CAPILLARY: 332 mg/dL — AB (ref 65–99)
GLUCOSE-CAPILLARY: 131 mg/dL — AB (ref 65–99)
GLUCOSE-CAPILLARY: 50 mg/dL — AB (ref 65–99)
Glucose-Capillary: 111 mg/dL — ABNORMAL HIGH (ref 65–99)
Glucose-Capillary: 165 mg/dL — ABNORMAL HIGH (ref 65–99)

## 2016-01-29 MED ORDER — INSULIN GLARGINE 100 UNIT/ML SOLOSTAR PEN: 20.0000 [IU] | PEN_INJECTOR | Freq: Every day | SUBCUTANEOUS | Status: AC

## 2016-01-29 MED ORDER — BLOOD GLUCOSE MONITOR KIT: PACK | Status: DC

## 2016-01-29 MED ORDER — POTASSIUM CHLORIDE CRYS ER 20 MEQ PO TBCR: 20.0000 meq | EXTENDED_RELEASE_TABLET | Freq: Two times a day (BID) | ORAL | Status: AC

## 2016-01-29 MED ORDER — FUROSEMIDE 10 MG/ML IJ SOLN
80.0000 mg | Freq: Once | INTRAMUSCULAR | Status: AC
  Administered 2016-01-29: 80 mg via INTRAVENOUS
  Filled 2016-01-29: qty 8

## 2016-01-29 MED ORDER — HYDRALAZINE HCL 25 MG PO TABS: 25.0000 mg | ORAL_TABLET | Freq: Three times a day (TID) | ORAL | Status: DC

## 2016-01-29 MED ORDER — INSULIN ASPART 100 UNIT/ML FLEXPEN: PEN_INJECTOR | SUBCUTANEOUS | Status: AC

## 2016-01-29 MED ORDER — FUROSEMIDE 40 MG PO TABS: 80.0000 mg | ORAL_TABLET | Freq: Two times a day (BID) | ORAL | Status: DC

## 2016-01-29 MED FILL — !LANTUS SOLOSTAR 100UNITS/M: 100 | 30 days supply | Qty: 6 | Fill #0

## 2016-01-29 NOTE — Consult Note (Signed)
[facility] Medical Consultation  Hector Foppiano OTL:[id number] DOB: 1961-10-19 DOA: 01/26/2016 PCP: Lora Paula, MD   Requesting physician: Ms. Nada Boozer. Nursing practitioner. Date of consultation: 01/27/2016 Reason for consultation: uncontrolled blood sugar  Impression/Recommendations Principal Problem:   Acute on chronic systolic CHF (congestive heart failure) (HCC) Active Problems:   Type 2 diabetes mellitus with hyperglycemia (HCC)   SOB (shortness of breath)   Tobacco abuse   Acute renal failure (HCC)   Pulmonary hypertension (HCC)   Cardiomyopathy (HCC)   Diabetes mellitus type 2 uncontrolled with complications  -4/3 hemoglobin A1c= 13.4 -hyperglycemia not in DKA --Patient should be discharged on insulin in order to bring his hemoglobin A1c under control as quickly as possible, this is discussed with the patient and his wife in room, they agree with the plan, patient also has been drinking excessive soft drinks, dietician consulted for diet education Diabetes coordination consulted  As well  -discharge insulin regimen: lantus 20unit qhs, novolog ssi, glucometer , lancets and testing strips ordered,  glipizide discontinued due to c/o hypoglycemia episodes.  patient to close follow up with pmd and endocrinology for further diabetes meds adjustment  Substance abuse (cocaine) -Patient's UDS positive cocaine; spoke at length with patient and wife concerning for sequela of continuing to use cocaine to include massive MI, renal failure, stroke, DEATH -patient denies use  Cardiomyopathy/MV regurgitation -Per cardiology  Pulmonary hypertension -Per cardiology  Acute renal failure  - Improving, pmd to repeat basic labs at follow up    Thank you for this consultation.  Chief Complaint:   HPI:  55 y.o. BM PMHx Substance Abuse (cocaine), Chronic Systolic CHF, Proxysmal Atrial Fibrillation/Flutter, Ischemic Cardiomyopathy, CAD native artery S/Pstenting, MV  Regurgitation HTN, NSTEMI, HLD, DM type 2 with complications   who was admitted for acute exacerbation of his CHF with shortness of breath. Patient states over the last 1 month his blood sugar has been running high and was initially placed on sliding scale coverage 4 weeks ago for which his blood sugar improved less than 200. He has been using Eucerin scale coverage for 2 weeks following which he discontinued. Today on admission is found to have a sugar more than 600 but not in DKA. Patient has shortness of breath which has been ongoing for last few weeks and has been gradually worsening with lower extremity edema for which patient has been admitted for further management with IV Lasix as per the cardiology. Patient denies any chest pain or nausea vomiting abdominal pain or diarrhea. Patient states she has been compliant with his glipizide. He does have some productive cough which has been chronic but has no fever or chills. Chest x-ray was unremarkable. He does have lower extremity edema but has no signs of any infection.    Review of symptoms The patient denies anorexia, fever, weight loss,, vision loss, decreased hearing, hoarseness, chest pain, syncope, dyspnea on exertion, peripheral edema, balance deficits, hemoptysis, abdominal pain, melena, hematochezia, severe indigestion/heartburn, hematuria, incontinence, genital sores, muscle weakness, suspicious skin lesions, transient blindness, difficulty walking, depression, unusual weight change, abnormal bleeding, enlarged lymph nodes, angioedema, and breast masses.   Consultants: Dr.Katarina San Morelle cardiology primary hospitalist as consultant for uncontrolled blood sugar  Procedure/Significant Events: 1/25 echocardiogram; - Left ventricle: Moderate LVH. -LVEF= 40% to 45%. Akinesis of the mid-apicalapical myocardium.- Mitral valve:  severe regurgitation.-- Left atrium: severely dilated. - Pulmonary arteries: PA peak pressure: 65 mm Hg  (S).   Culture  NA  Antibiotics:  NA DVT prophylaxis: On  xarelto   Past Medical History  Diagnosis Date  . Diabetes mellitus with complication (HCC)   . Hypertension   . Syncope     a. 02/2015 - felt 2/2 rapid AF/AFL.  Marland Kitchen Atrial fibrillation and flutter (HCC)     a. Dx 02/2015 - placed on Xarelto; b. 09/2015 recurrence in setting of PNA.  . Ischemic cardiomyopathy     a. 09/2015 Echo: EF 35-40%.  Marland Kitchen CAD (coronary artery disease)     a. Dx 02/2015 - 2V CAD with CTO LAD, diffuse dz in PDA, OM - med rx.  . Hypertensive heart disease   . Hyperlipidemia   . Mitral regurgitation     a. Mod by echo 02/2015,  . Tobacco abuse   . Chronic systolic CHF (congestive heart failure) (HCC)     a. 09/2015 Echo: EF 35-40%, sev apical/antlat, apical HK, mild MR, mildly dil LA.  Marland Kitchen ARF (acute respiratory failure) (HCC)   . Pneumonia 09/2015  . GERD (gastroesophageal reflux disease)   . Headache   . Arthritis     ra  . NSTEMI (non-ST elevated myocardial infarction) (HCC) 10/2015   Past Surgical History  Procedure Laterality Date  . Right thumb surgery     . Cardiac catheterization N/A 02/24/2015    Procedure: Left Heart Cath and Coronary Angiography;  Surgeon: Marykay Lex, MD;  Location: [facility] INVASIVE CV LAB CUPID;  Service: Cardiovascular;  Laterality: N/A;  . Cardiac catheterization  02/24/2015    Procedure: Coronary/Bypass Graft CTO Intervention;  Surgeon: Marykay Lex, MD;  Location: [facility] INVASIVE CV LAB CUPID;  Service: Cardiovascular;;  . Cardiac catheterization N/A 11/03/2015    Procedure: Left Heart Cath and Coronary Angiography;  Surgeon: Tonny Bollman, MD;  Location: [facility] INVASIVE CV LAB;  Service: Cardiovascular;  Laterality: N/A;  . Cardiac catheterization N/A 11/03/2015    Procedure: Coronary Stent Intervention;  Surgeon: Tonny Bollman, MD;  Location: [facility] INVASIVE CV LAB;  Service: Cardiovascular;  Laterality: N/A;  RAMUS   Social History:  reports that he has been smoking Cigarettes.   He has a 3 pack-year smoking history. He has never used smokeless tobacco. He reports that he uses illicit drugs (Cocaine). He reports that he does not drink alcohol.  No Known Allergies Family History  Problem Relation Age of Onset  . Diabetes Mother   . Hypertension Mother   . Heart disease Mother   . Cancer Mother     stomach cancer   . Diabetes Father   . Hypertension Father   . Heart disease Father   . Diabetes Sister   . Hypertension Sister   . Cancer Sister     breast cancer   . Diabetes Brother   . Hypertension Brother   . Heart disease Brother   . Stroke Brother   . Heart attack Mother   . Heart attack Father   . Heart attack Brother     Prior to Admission medications   Medication Sig Start Date End Date Taking? Authorizing Provider  albuterol (PROVENTIL HFA;VENTOLIN HFA) 108 (90 Base) MCG/ACT inhaler Inhale 2 puffs into the lungs every 6 (six) hours as needed for wheezing or shortness of breath. 12/05/15  Yes Jaclyn Shaggy, MD  amiodarone (PACERONE) 200 MG tablet Take 1 tablet (200 mg total) by mouth daily. 12/01/15  Yes Maryann Mikhail, DO  amLODipine (NORVASC) 5 MG tablet Take 1 tablet (5 mg total) by mouth daily. 12/01/15  Yes Rolly Salter, MD  atorvastatin (LIPITOR) 80 MG  tablet Take 1 tablet (80 mg total) by mouth every evening. 10/17/15  Yes Ripudeep Jenna Luo, MD  carvedilol (COREG) 12.5 MG tablet Take 12.5 mg by mouth 2 (two) times daily with a meal.   Yes Historical Provider, MD  clopidogrel (PLAVIX) 75 MG tablet Take 1 tablet (75 mg total) by mouth daily with breakfast. 11/06/15  Yes Joseph Art, DO  Dexlansoprazole (DEXILANT) 30 MG capsule Take 1 capsule (30 mg total) by mouth daily. 12/30/14  Yes Josalyn Funches, MD  digoxin (LANOXIN) 0.125 MG tablet Take 0.125 mg by mouth daily.   Yes Historical Provider, MD  diltiazem (DILACOR XR) 240 MG 24 hr capsule Take 240 mg by mouth daily.   Yes Historical Provider, MD  Elastic Bandages & Supports (MEDICAL COMPRESSION  STOCKINGS) MISC 20 mm Hg pressure 01/05/16  Yes Josalyn Funches, MD  furosemide (LASIX) 40 MG tablet TAKE 2 TABLETS BY MOUTH IN THE A.M AND 1 TABLET IN THE PM  X'S 3 DAYS THEN TAKE 1 TABLET BY MOUTH TWICE A DAY Patient taking differently: Take 120 mg by mouth 2 (two) times daily. TAKE 2 TABLETS BY MOUTH IN THE A.M AND 1 TABLET IN THE PM  X'S 3 DAYS THEN TAKE 1 TABLET BY MOUTH TWICE A DAY 01/07/16  Yes Kelle Darting Hager, PA-C  glipiZIDE (GLUCOTROL) 10 MG tablet Take 1 tablet (10 mg total) by mouth 2 (two) times daily before a meal. 01/05/16  Yes Josalyn Funches, MD  hydrALAZINE (APRESOLINE) 50 MG tablet Take 50 mg by mouth 3 (three) times daily.   Yes Historical Provider, MD  ipratropium-albuterol (DUONEB) 0.5-2.5 (3) MG/3ML SOLN Take 3 mLs by nebulization 2 (two) times daily. 10/17/15  Yes Ripudeep Jenna Luo, MD  isosorbide mononitrate (IMDUR) 30 MG 24 hr tablet Take 1 tablet (30 mg total) by mouth daily. 11/11/15  Yes Jaclyn Shaggy, MD  lisinopril (PRINIVIL,ZESTRIL) 20 MG tablet Take 20 mg by mouth daily.   Yes Historical Provider, MD  potassium chloride SA (K-DUR,KLOR-CON) 20 MEQ tablet TAKE 1 TABLET BY MOUTH TWICE A DAY X'S 3 DAYS THEN TAKE 1 TABLET DAILY 01/07/16  Yes Dwana Melena, PA-C  rivaroxaban (XARELTO) 20 MG TABS tablet Take 1 tablet (20 mg total) by mouth daily with supper. Patient taking differently: Take 20 mg by mouth daily.  02/25/15  Yes Dayna N Dunn, PA-C  spironolactone (ALDACTONE) 25 MG tablet Take 1 tablet (25 mg total) by mouth 2 (two) times daily. 01/05/16  Yes Josalyn Funches, MD  traMADol (ULTRAM) 50 MG tablet Take 1 tablet (50 mg total) by mouth every 6 (six) hours as needed for severe pain. 11/25/15  Yes Jaclyn Shaggy, MD  nitroGLYCERIN (NITROSTAT) 0.4 MG SL tablet Place 1 tablet (0.4 mg total) under the tongue every 5 (five) minutes as needed for chest pain (up to 3 doses). 02/25/15   Laurann Montana, PA-C   Physical Exam: Blood pressure 108/90, pulse 73, temperature 98.2 F (36.8 C),  temperature source Oral, resp. rate 16, height 5\' 9"  (1.753 m), weight 76.204 kg (168 lb), SpO2 100 %. Filed Vitals:   01/28/16 2014 01/29/16 0028 01/29/16 0407 01/29/16 1000  BP: 107/76 104/76 112/78 108/90  Pulse: 72 73 73 73  Temp: 97.9 F (36.6 C) 97.6 F (36.4 C) 98.2 F (36.8 C)   TempSrc: Oral Oral Oral   Resp: 16 16 16    Height:      Weight:   76.204 kg (168 lb)   SpO2: 100% 98% 100%  General: A/O 4, positive acute respiratory distress Eyes: Negative headache, eye pain, double vision,negative scleral hemorrhage ENT: Negative Runny nose, negative ear pain, negative gingival bleeding, Neck:  Negative scars, masses, torticollis, lymphadenopathy, JVD Lungs: Clear to auscultation bilaterally without wheezes or crackles Cardiovascular: Regular rate and rhythm without murmur gallop or rub normal S1 and S2 Abdomen:negative abdominal pain, nondistended, positive soft, bowel sounds, no rebound, no ascites, no appreciable mass Extremities: No significant cyanosis, clubbing, subsideing edema bilateral lower extremities Psychiatric:  Negative depression, negative anxiety, negative fatigue, negative mania  Neurologic:  Cranial nerves II through XII intact, tongue/uvula midline, all extremities muscle strength 5/5, sensation intact throughout, negative dysarthria, negative expressive aphasia, negative receptive aphasia.   Labs on Admission:  Basic Metabolic Panel:  Recent Labs Lab 01/26/16 1756 01/27/16 0328 01/28/16 0218 01/29/16 0433  NA 135 133* 140 135  K 4.7 3.9 3.4* 4.3  CL 98* 99* 104 101  CO2 25 22 26 22   GLUCOSE 608* 233* 226* 369*  BUN 21* 25* 27* 30*  CREATININE 1.42* 1.32* 1.37* 1.51*  CALCIUM 9.1 8.3* 8.4* 8.5*  MG  --   --  1.6*  --    Liver Function Tests:  Recent Labs Lab 01/26/16 1756 01/28/16 0218  AST 39 33  ALT 76* 55  ALKPHOS 242* 171*  BILITOT 1.7* 0.8  PROT 6.3* 4.7*  ALBUMIN 2.8* 2.1*   No results for input(s): LIPASE, AMYLASE in the  last 168 hours. No results for input(s): AMMONIA in the last 168 hours. CBC:  Recent Labs Lab 01/26/16 1846  WBC 10.3  NEUTROABS 8.4*  HGB 14.4  HCT 43.2  MCV 86.9  PLT 216   Cardiac Enzymes: No results for input(s): CKTOTAL, CKMB, CKMBINDEX, TROPONINI in the last 168 hours. BNP: Invalid input(s): POCBNP CBG:  Recent Labs Lab 01/28/16 1711 01/28/16 2007 01/29/16 0003 01/29/16 0622 01/29/16 0853  GLUCAP 117* 70 165* 332* 111*    Radiological Exams on Admission: Dg Chest Port 1 View  01/27/2016  CLINICAL DATA:  Short of breath EXAM: PORTABLE CHEST 1 VIEW COMPARISON:  12/09/2015 FINDINGS: Upper normal heart size. Focal patchy density in the right mid lung zone persists. Small right pleural effusion has developed. No pneumothorax. Patchy bibasilar airspace opacities have improved. IMPRESSION: Patchy bibasilar airspace opacities have improved. Patchy right midlung opacity persists. Continued followup until complete resolution is recommended. If the abnormality fails to resolve, underlying nodule cannot be excluded. Electronically Signed   By: 12/11/2015 M.D.   On: 01/27/2016 13:31      Time spent: 25 minutes  Ashauna Bertholf MD PhD [facility] Pager [phone]  If 7PM-7AM, please contact night-coverage [email or web address] Password [email or web address] 01/29/2016, 11:15 AM

## 2016-01-29 NOTE — Progress Notes (Signed)
Inpatient Diabetes Program Recommendations  AACE/ADA: New Consensus Statement on Inpatient Glycemic Control (2015)  Target Ranges:  Prepandial:   less than 140 mg/dL      Peak postprandial:   less than 180 mg/dL (1-2 hours)      Critically ill patients:  140 - 180 mg/dL   Review of Glycemic ControlResults for Sean Hinton, Sean Hinton (MRN [id number]) as of 01/29/2016 14:10  Ref. Range 01/28/2016 20:07 01/29/2016 00:03 01/29/2016 06:22 01/29/2016 08:53 01/29/2016 10:55 01/29/2016 11:22  Glucose-Capillary Latest Ref Range: 65-99 mg/dL 70 426 (H) 834 (H) 196 (H) 50 (L) 131 (H)    Inpatient Diabetes Program Recommendations:   Note that patient refused Lantus insulin last PM due to low blood sugar.  Agree with d/c of Novolog meal coverage.  May consider changing Novolog correction to tid with meals and HS.  Will follow.  Thanks, Beryl Meager, RN, BC-ADM Inpatient Diabetes Coordinator Pager [phone] (8a-5p)

## 2016-01-29 NOTE — Progress Notes (Addendum)
Patient Name: Sean Hinton Date of Encounter: 01/29/2016  Principal Problem:   Acute on chronic systolic CHF (congestive heart failure) (HCC) Active Problems:   Type 2 diabetes mellitus with hyperglycemia (HCC)   SOB (shortness of breath)   Tobacco abuse   Acute renal failure (HCC)   Pulmonary hypertension (HCC)   Cardiomyopathy [facility])   Primary Cardiologist: Dr Royann Shivers Patient Profile: 55 y.o. male with a history of ICM w/ EF 35-40% 09/2015 echo, PAF on Xarelto, HTN, HLD, DM, cocaine hx, tobacco use, CAD w/ CTO LAD, DES RI 10/2015. Admitted 04/03 w/ CHF, hyperglycemia w/ uncontrolled DM.  SUBJECTIVE: No chest pain, improved SOB.  OBJECTIVE Filed Vitals:   01/28/16 2014 01/29/16 0028 01/29/16 0407 01/29/16 1000  BP: 107/76 104/76 112/78 108/90  Pulse: 72 73 73 73  Temp: 97.9 F (36.6 C) 97.6 F (36.4 C) 98.2 F (36.8 C)   TempSrc: Oral Oral Oral   Resp: 16 16 16    Height:      Weight:   168 lb (76.204 kg)   SpO2: 100% 98% 100%     Intake/Output Summary (Last 24 hours) at 01/29/16 1037 Last data filed at 01/29/16 1000  Gross per 24 hour  Intake   1400 ml  Output   2140 ml  Net   -740 ml   Filed Weights   01/27/16 0637 01/28/16 0426 01/29/16 0407  Weight: 171 lb 6.4 oz (77.747 kg) 166 lb 1.6 oz (75.342 kg) 168 lb (76.204 kg)    PHYSICAL EXAM General: Well developed, well nourished, male in no acute distress. Head: Normocephalic, atraumatic.  Neck: Supple without bruits, JVD 8-9 cm. Lungs:  Resp regular and unlabored, decreased BS bases w/ rales. Heart: RRR, S1, S2, no S3, S4, or murmur; no rub. Abdomen: Soft, non-tender, non-distended, BS + x 4.  Extremities: No clubbing, cyanosis, L>R edema.  Neuro: Alert and oriented X 3. Moves all extremities spontaneously. Psych: Normal affect.  LABS: CBC:  Recent Labs  01/26/16 1846  WBC 10.3  NEUTROABS 8.4*  HGB 14.4  HCT 43.2  MCV 86.9  PLT 216   INR:  Recent Labs  01/26/16 1756  INR 1.82*    Basic Metabolic Panel:  Recent Labs  03/27/16 0218 01/29/16 0433  NA 140 135  K 3.4* 4.3  CL 104 101  CO2 26 22  GLUCOSE 226* 369*  BUN 27* 30*  CREATININE 1.37* 1.51*  CALCIUM 8.4* 8.5*  MG 1.6*  --    Liver Function Tests:  Recent Labs  01/26/16 1756 01/28/16 0218  AST 39 33  ALT 76* 55  ALKPHOS 242* 171*  BILITOT 1.7* 0.8  PROT 6.3* 4.7*  ALBUMIN 2.8* 2.1*   BNP:  B NATRIURETIC PEPTIDE  Date/Time Value Ref Range Status  01/26/2016 06:48 PM 1378.3* 0.0 - 100.0 pg/mL Final  12/09/2015 09:06 AM 795.1* 0.0 - 100.0 pg/mL Final   Hemoglobin A1C:  Recent Labs  01/26/16 1847  HGBA1C 13.4*   Thyroid Function Tests:  Recent Labs  01/26/16 1756  TSH 1.554   TELE: SR, PVCs     Radiology/Studies: Pending  Current Medications:  . amiodarone  200 mg Oral Daily  . amLODipine  5 mg Oral Daily  . atorvastatin  80 mg Oral QPM  . carvedilol  12.5 mg Oral BID WC  . clopidogrel  75 mg Oral Q breakfast  . digoxin  0.125 mg Oral Daily  . diltiazem  240 mg Oral Daily  . furosemide  80  mg Oral BID  . glipiZIDE  10 mg Oral BID AC  . hydrALAZINE  25 mg Oral TID  . insulin aspart  0-15 Units Subcutaneous 6 times per day  . insulin aspart  8 Units Subcutaneous TID WC  . insulin glargine  20 Units Subcutaneous QHS  . isosorbide mononitrate  30 mg Oral Daily  . lisinopril  20 mg Oral Daily  . pantoprazole  40 mg Oral Daily  . potassium chloride SA  20 mEq Oral BID  . rivaroxaban  20 mg Oral Daily  . sodium chloride flush  3 mL Intravenous Q12H  . spironolactone  25 mg Oral BID      ASSESSMENT AND PLAN:  Principal Problem: 1.  Acute on chronic systolic CHF (congestive heart failure) (HCC) - diuresed 5 L of fluid, now at baseline weight - his mainstay of therapy will be staying away from cocaine, he denies but tested positive this visit and last 2 visits. - start lasix 80 mg po BID at discharge, give additional lasix 80 mg iv x 1 as per CHF team recommendation  considering his The ReDS reading is: ( >39) 45, follow up in the clinic within 2 weeks - Crea trending up today, therefore lower lasix dose  2.  Type 2 diabetes mellitus with hyperglycemia (HCC) - IM seeing and managing. - pt has not been diet complaint - with his drug hx, reluctant to start insulin, he needs to as his HbA1c is 13% !!!  3. Hx polysubstance abuse - tested positive for cocaine again, he denies it and asking why is he in CHF again  Discharge home today.  Lars Masson 01/29/2016

## 2016-01-29 NOTE — Discharge Summary (Signed)
Discharge Summary    Patient ID: Sean Hinton,  MRN: [id number], DOB/AGE: 03/07/1961 55 y.o.  Admit date: 01/26/2016 Discharge date: 01/29/2016  Primary Care Provider: Minerva Ends Primary Cardiologist: Dr. Sallyanne Kuster  Discharge Diagnoses    Principal Problem:   Acute on chronic systolic CHF (congestive heart failure) ([place]) Active Problems:   Type 2 diabetes mellitus with hyperglycemia (HCC)   SOB (shortness of breath)   Tobacco abuse   Acute renal failure (HCC)   Pulmonary hypertension (HCC)   Cardiomyopathy ([place])   Allergies No Known Allergies  Diagnostic Studies/Procedures    None during this admission _____________   History of Present Illness     Sean Hinton is a 55 y.o. male with a history of ICM w/ EF 35-40% 09/2015 echo, PAF on Xarelto, HTN, HLD, DM, cocaine hx, tobacco use, CAD w/ CTO LAD, DES RI 10/2015.  Seen in office 03/15 with volume overload 2nd dietary indiscretions, Lasix increased and f/u planned. Wt 174.  Sean Hinton presents for f/u of CHF.  [name] has not lost any weight. He states he is compliant with his diuretics but they make him very thirsty. He estimates that he is drinking a gallon of water a day. He states he is doing better with sodium restriction, but did eat [employer] recently. He also notes that his blood sugars have been very high recently. He has not had chest pain. He has not had palpitations. He states he has not had cocaine in over a week.  He describes dyspnea on exertion and wonders why he is so short of breath. He also describes PND and orthopnea. His lower extremity edema is not improved.  He has some legal issues and needs to go to court today at 1:30. He states he can go to the hospital after that.   Hospital Course     Consultants: Internal Medicine consulted for controlled diabetes with Hgb A1C 13   Patient was admitted to [facility] for aggressive IV diuresis. Internal medicine service was  consulted for uncontrolled diabetes with hemoglobin A1c 13.4. He is home oral anti-glycemic medication has been discontinued. His glucose was running in the 600 range. BNP on arrival was 1378.3. Urine drug test was positive for cocaine. Chest x-ray showed patchy right midlung opacity, patchy bibasilar airspace opacities. He was seen in the morning of 01/29/2016, at which time he has been diuresed 6.7 L. His weight has been dropped from 171 lbs on admission down to 168 lbs which will serve as his dry weight. He was enrolled in the Re[facility] Vest Discharge study, unfortunately initial reading was high at 45 on 4/8. He was encouraged to stay another day, however he and his wife are adamant about leaving. He has been evaluated by heart failure service, he was given additional dose of 87m IV Lasix. He has since been transitioned to oral Lasix 80 mg twice a day. He is deemed stable for discharge from heart failure perspective. He has been set up to follow-up in the heart failure clinic at which time BMET will be checked. Internal medicine service has ordered diabetes insulin kit for him upon discharge.  _____________  Discharge Vitals Blood pressure 116/76, pulse 79, temperature 97.7 F (36.5 C), temperature source Oral, resp. rate 16, height 5' 9"  (1.753 m), weight 168 lb (76.204 kg), SpO2 99 %.  Filed Weights   01/27/16 0637 01/28/16 0426 01/29/16 0407  Weight: 171 lb 6.4 oz (77.747 kg) 166 lb 1.6 oz (75.342  kg) 168 lb (76.204 kg)    Labs & Radiologic Studies     CBC  Recent Labs  01/26/16 1846  WBC 10.3  NEUTROABS 8.4*  HGB 14.4  HCT 43.2  MCV 86.9  PLT 621   Basic Metabolic Panel  Recent Labs  01/28/16 0218 01/29/16 0433  NA 140 135  K 3.4* 4.3  CL 104 101  CO2 26 22  GLUCOSE 226* 369*  BUN 27* 30*  CREATININE 1.37* 1.51*  CALCIUM 8.4* 8.5*  MG 1.6*  --    Liver Function Tests  Recent Labs  01/26/16 1756 01/28/16 0218  AST 39 33  ALT 76* 55  ALKPHOS 242* 171*  BILITOT 1.7*  0.8  PROT 6.3* 4.7*  ALBUMIN 2.8* 2.1*   Hemoglobin A1C  Recent Labs  01/26/16 1847  HGBA1C 13.4*   Thyroid Function Tests  Recent Labs  01/26/16 1756  TSH 1.554    Dg Chest Port 1 View  01/27/2016  CLINICAL DATA:  Short of breath EXAM: PORTABLE CHEST 1 VIEW COMPARISON:  12/09/2015 FINDINGS: Upper normal heart size. Focal patchy density in the right mid lung zone persists. Small right pleural effusion has developed. No pneumothorax. Patchy bibasilar airspace opacities have improved. IMPRESSION: Patchy bibasilar airspace opacities have improved. Patchy right midlung opacity persists. Continued followup until complete resolution is recommended. If the abnormality fails to resolve, underlying nodule cannot be excluded. Electronically Signed   By: Marybelle Killings M.D.   On: 01/27/2016 13:31    Disposition   Pt is being discharged home today in good condition.  Follow-up Plans & Appointments    Follow-up Information    Follow up with [facility], MD In 2 weeks.   Specialty:  Endocrinology   Why:  uncontrolled diabetes, a1c 13.4   Contact information:   [place] [address] [phone]       Follow up with Minerva Ends, MD In 1 week.   Specialty:  Family Medicine   Why:  diabetes control. a1c 13.4   Contact information:   [place] [address] [phone]       Follow up with [facility] on 02/03/2016.   Specialty:  Internal Medicine   Why:  at 10:30am for a [facility] appointment with Dr Lisette Grinder information:   [id number] [place] [phone]      Follow up with Darrick Grinder, NP.   Specialty:  Cardiology   Why:  Heart Failure Clinic on 1st floor at [facility]. at 9:00 [place] information:   [address] [place] [address] [phone]      Discharge Instructions    For home use only  DME Glucometer    Complete by:  As directed            Discharge Medications   Current Discharge Medication List    START taking these medications   Details  blood glucose meter kit and supplies KIT Dispense based on patient and insurance preference. Use up to four times daily as directed. (FOR ICD-9 250.00, 250.01). Qty: 1 each, Refills: 0    insulin aspart (NOVOLOG FLEXPEN) 100 UNIT/ML FlexPen Before each meal 3 times a day, 140-199 - 2 units, 200-250 - 4 units, 251-299 - 6 units,  300-349 - 8 units,  350 or above 10 units. Insulin PEN if approved, provide syringes and  needles if needed. Qty: 15 mL, Refills: 0    Insulin Glargine (LANTUS SOLOSTAR) 100 UNIT/ML Solostar Pen Inject 20 Units into the skin daily at 10 pm. Qty: 15 mL, Refills: 0      CONTINUE these medications which have CHANGED   Details  furosemide (LASIX) 40 MG tablet Take 2 tablets (80 mg total) by mouth 2 (two) times daily. Qty: 60 tablet, Refills: 3   Associated Diagnoses: Chronic systolic CHF (congestive heart failure) (HCC)    hydrALAZINE (APRESOLINE) 25 MG tablet Take 1 tablet (25 mg total) by mouth 3 (three) times daily. Qty: 90 tablet, Refills: 5    potassium chloride SA (K-DUR,KLOR-CON) 20 MEQ tablet Take 1 tablet (20 mEq total) by mouth 2 (two) times daily. Qty: 60 tablet, Refills: 3      CONTINUE these medications which have NOT CHANGED   Details  albuterol (PROVENTIL HFA;VENTOLIN HFA) 108 (90 Base) MCG/ACT inhaler Inhale 2 puffs into the lungs every 6 (six) hours as needed for wheezing or shortness of breath. Qty: 1 Inhaler, Refills: 2    amiodarone (PACERONE) 200 MG tablet Take 1 tablet (200 mg total) by mouth daily. Qty: 30 tablet, Refills: 0    amLODipine (NORVASC) 5 MG tablet Take 1 tablet (5 mg total) by mouth daily. Qty: 30 tablet, Refills: 0    atorvastatin (LIPITOR) 80 MG tablet Take 1 tablet (80 mg total) by mouth every evening. Qty: 30 tablet, Refills: 3    carvedilol (COREG)  12.5 MG tablet Take 12.5 mg by mouth 2 (two) times daily with a meal.    clopidogrel (PLAVIX) 75 MG tablet Take 1 tablet (75 mg total) by mouth daily with breakfast. Qty: 30 tablet, Refills: 0    Dexlansoprazole (DEXILANT) 30 MG capsule Take 1 capsule (30 mg total) by mouth daily. Qty: 90 capsule, Refills: 3   Associated Diagnoses: Gastroesophageal reflux disease, esophagitis presence not specified    digoxin (LANOXIN) 0.125 MG tablet Take 0.125 mg by mouth daily.    diltiazem (DILACOR XR) 240 MG 24 hr capsule Take 240 mg by mouth daily.    Elastic Bandages & Supports (MEDICAL COMPRESSION STOCKINGS) MISC 20 mm Hg pressure Qty: 2 each, Refills: 3   Associated Diagnoses: Chronic systolic CHF (congestive heart failure) (HCC)    ipratropium-albuterol (DUONEB) 0.5-2.5 (3) MG/3ML SOLN Take 3 mLs by nebulization 2 (two) times daily. Qty: 360 mL, Refills: 10    isosorbide mononitrate (IMDUR) 30 MG 24 hr tablet Take 1 tablet (30 mg total) by mouth daily. Qty: 30 tablet, Refills: 2    lisinopril (PRINIVIL,ZESTRIL) 20 MG tablet Take 20 mg by mouth daily.    rivaroxaban (XARELTO) 20 MG TABS tablet Take 1 tablet (20 mg total) by mouth daily with supper. Qty: 30 tablet, Refills: 3    spironolactone (ALDACTONE) 25 MG tablet Take 1 tablet (25 mg total) by mouth 2 (two) times daily. Qty: 60 tablet, Refills: 0   Associated Diagnoses: Chronic systolic CHF (congestive heart failure) (HCC)    traMADol (ULTRAM) 50 MG tablet Take 1 tablet (50 mg total) by mouth every 6 (six) hours as needed for severe pain. Qty: 30 tablet, Refills: 0   Associated Diagnoses: Left knee pain    nitroGLYCERIN (NITROSTAT) 0.4 MG SL tablet Place 1 tablet (0.4 mg total) under the tongue every 5 (five) minutes as needed for chest pain (up to 3 doses). Qty: 25 tablet, Refills: 3      STOP taking these medications     glipiZIDE (GLUCOTROL)  10 MG tablet             Outstanding Labs/Studies   BMET on  followup  Duration of Discharge Encounter   Greater than 30 minutes including physician time.  Signed, Almyra Deforest PA-C 01/29/2016, 3:00 PM

## 2016-01-29 NOTE — Progress Notes (Addendum)
Heart Failure Navigator Consult Note  Presentation: Sean Hinton is a 55 y.o. male with a history of ICM w/ EF 35-40% 09/2015 echo, PAF on Xarelto, HTN, HLD, DM, cocaine hx, tobacco use, CAD w/ CTO LAD, DES RI 10/2015.  Sean Hinton has not lost any weight. He states he is compliant with his diuretics but they make him very thirsty. He estimates that he is drinking a gallon of water a day. He states he is doing better with sodium restriction, but did eat [employer] recently. He also notes that his blood sugars have been very high recently. He has not had chest pain. He has not had palpitations. He states he has not had cocaine in over a week.  He describes dyspnea on exertion and wonders why he is so short of breath. He also describes PND and orthopnea. His lower extremity edema is not improved.  Past Medical History  Diagnosis Date  . Diabetes mellitus with complication (HCC)   . Hypertension   . Syncope     a. 02/2015 - felt 2/2 rapid AF/AFL.  Marland Kitchen Atrial fibrillation and flutter (HCC)     a. Dx 02/2015 - placed on Xarelto; b. 09/2015 recurrence in setting of PNA.  . Ischemic cardiomyopathy     a. 09/2015 Echo: EF 35-40%.  Marland Kitchen CAD (coronary artery disease)     a. Dx 02/2015 - 2V CAD with CTO LAD, diffuse dz in PDA, OM - med rx.  . Hypertensive heart disease   . Hyperlipidemia   . Mitral regurgitation     a. Mod by echo 02/2015,  . Tobacco abuse   . Chronic systolic CHF (congestive heart failure) (HCC)     a. 09/2015 Echo: EF 35-40%, sev apical/antlat, apical HK, mild Sean, mildly dil LA.  Marland Kitchen ARF (acute respiratory failure) (HCC)   . Pneumonia 09/2015  . GERD (gastroesophageal reflux disease)   . Headache   . Arthritis     ra  . NSTEMI (non-ST elevated myocardial infarction) (HCC) 10/2015    Social History   Social History  . Marital Status: Single    Spouse Name: N/A  . Number of Children: N/A  . Years of Education: N/A   Social History Main Topics  . Smoking status: Current Some Day  Smoker -- 0.10 packs/day for 30 years    Types: Cigarettes  . Smokeless tobacco: Never Used  . Alcohol Use: No  . Drug Use: Yes    Special: Cocaine     Comment: "last use 1991", Positive UDS 10/2015./last cocaine use on 10/19/2015  . Sexual Activity: Not Asked   Other Topics Concern  . None   Social History Narrative    ECHO:Study Conclusions--11/19/15  - Left ventricle: The cavity size was mildly dilated. Wall  thickness was increased in a pattern of moderate LVH. There was  focal basal hypertrophy. Systolic function was mildly to  moderately reduced. The estimated ejection fraction was in the  range of 40% to 45%. Akinesis of the mid-apicalapical myocardium. - Mitral valve: There was severe regurgitation. - Left atrium: The atrium was severely dilated. - Pulmonary arteries: Systolic pressure was moderately increased.  PA peak pressure: 65 mm Hg (S).  Transthoracic echocardiography. M-mode, complete 2D, spectral Doppler, and color Doppler. Birthdate: Patient birthdate: [birth year]. Age: Patient is 55 yr old. Sex: Gender: male. BMI: 24.1 kg/m^2. Blood pressure: 139/108 Patient status: Inpatient. Study date: Study date: 11/19/2015. Study time: 05:27 PM. Location: ICU/CCU  BNP    Component Value Date/Time  BNP 1378.3* 01/26/2016 1848    ProBNP    Component Value Date/Time   PROBNP 1491.0* 01/06/2016 1138     Education Assessment and Provision:  Detailed education and instructions provided on heart failure disease management including the following:  Signs and symptoms of Heart Failure When to call the physician Importance of daily weights Low sodium diet Fluid restriction Medication management Anticipated future follow-up appointments  Patient education given on each of the above topics.  Patient acknowledges understanding and acceptance of all instructions.  I spoke with Sean. Randol regarding his HF.  He tells me that he has a scale at  home--yet it has a dead battery and he admits that he does not use it regularly "not gonna lie".  I reinforced the need for daily weights and how increased weights relate to the signs and symptoms of HF.  He can teach back low sodium diet information and says that he is doing more "label reading".  I emphasized that he should follow a 2000 mg sodium or less diet and I reviewed high sodium foods to avoid.  He denies any issues getting or taking prescribed medications.  He follows with [facility] and will likely have a follow-up appt there after discharge.     Education Materials:  "Living Better With Heart Failure" Booklet, Daily Weight Tracker Tool    High Risk Criteria for Readmission and/or Poor Patient Outcomes:  (Recommend Follow-up with Advanced Heart Failure Clinic)-- yes Sean Flax could benefit from ongoing AHF Clinic visits and outpatient LCSW referral  EF <30%-40-45%  2 or more admissions in 6 months- Yes 5/6  Difficult social situation- No  Demonstrates medication noncompliance- No- He denies?   Barriers of Care:  Knowledge, compliance and substance abuse  Discharge Planning:   Plans to return to home in [place] with girlfriend.

## 2016-01-29 NOTE — Consult Note (Signed)
Advanced Heart Failure Team Consult Note  Referring Physician: Dr Meda Coffee Primary Physician: Dr Adrian Blackwater Primary Cardiologist:  Dr Sallyanne Kuster  Reason for Consultation: Janelle Floor Trial Reading 79.   HPI:   Sean Hinton is a 55 y.o. male with a history of ICM w/ EF 35-40% 09/2015 echo, PAF on Xarelto, HTN, HLD, DM, cocaine hx, tobacco use, CAD w/ CTO LAD, DES RI 10/2015. Note patient is not felt to be a candidate for consideration of mitral valve surgery given compliance issues and drug use.  March 15th lasix was increased to 80 mg /40 mg x 3 days. Weight at that time was 174 pounds.   He returned for follow up April 3rd and remained SOB and was admitted with volume overload and increased dyspnea. On admit  UDS + cocaine, creatinine 1.42, glucose 608, K 4.7, and BNP 1378. [facility] consulted for uncontrolled diabetes. Diuresed with IV lasix and transitioned to po lasix 80 mg po twice daily. Overall weight is down 3 pounds. Today he admits to mild dyspnea with exertion. No CP.   Today creatinine up 1.51.    ECHO 10/2015: EF 40-45% Akinesis mid-apic, sever Sean, moderate LVH, Peak PA pressure 65 mm hg .  LHC 11/03/2015: Dr Cleone Slim RCA lesion, 50% stenosed.  RPDA lesion, 80% stenosed.  1st RPLB lesion, 70% stenosed.  Mid RCA lesion, 50% stenosed.  Prox LAD lesion, 100% stenosed.  Ramus lesion, 100% stenosed. Post intervention, there is a 0% residual stenosis.  Review of Systems: [y] = yes, '[ ]'$  = no   General: Weight gain '[ ]'$ ; Weight loss '[ ]'$ ; Anorexia '[ ]'$ ; Fatigue '[ ]'$ ; Fever '[ ]'$ ; Chills '[ ]'$ ; Weakness [Y ]  Cardiac: Chest pain/pressure '[ ]'$ ; Resting SOB '[ ]'$ ; Exertional SOB [ Y]; Orthopnea '[ ]'$ ; Pedal Edema '[ ]'$ ; Palpitations '[ ]'$ ; Syncope '[ ]'$ ; Presyncope '[ ]'$ ; Paroxysmal nocturnal dyspnea'[ ]'$   Pulmonary: Cough '[ ]'$ ; Wheezing'[ ]'$ ; Hemoptysis'[ ]'$ ; Sputum '[ ]'$ ; Snoring '[ ]'$   GI: Vomiting'[ ]'$ ; Dysphagia'[ ]'$ ; Melena'[ ]'$ ; Hematochezia '[ ]'$ ; Heartburn'[ ]'$ ; Abdominal pain '[ ]'$ ; Constipation '[ ]'$ ;  Diarrhea '[ ]'$ ; BRBPR '[ ]'$   GU: Hematuria'[ ]'$ ; Dysuria '[ ]'$ ; Nocturia'[ ]'$   Vascular: Pain in legs with walking '[ ]'$ ; Pain in feet with lying flat '[ ]'$ ; Non-healing sores '[ ]'$ ; Stroke '[ ]'$ ; TIA '[ ]'$ ; Slurred speech '[ ]'$ ;  Neuro: Headaches'[ ]'$ ; Vertigo'[ ]'$ ; Seizures'[ ]'$ ; Paresthesias'[ ]'$ ;Blurred vision '[ ]'$ ; Diplopia '[ ]'$ ; Vision changes '[ ]'$   Ortho/Skin: Arthritis '[ ]'$ ; Joint pain '[ ]'$ ; Muscle pain '[ ]'$ ; Joint swelling '[ ]'$ ; Back Pain '[ ]'$ ; Rash '[ ]'$   Psych: Depression'[ ]'$ ; Anxiety'[ ]'$   Heme: Bleeding problems '[ ]'$ ; Clotting disorders '[ ]'$ ; Anemia '[ ]'$   Endocrine: Diabetes '[ ]'$ ; Thyroid dysfunction'[ ]'$   Home Medications Prior to Admission medications   Medication Sig Start Date End Date Taking? Authorizing Provider  albuterol (PROVENTIL HFA;VENTOLIN HFA) 108 (90 Base) MCG/ACT inhaler Inhale 2 puffs into the lungs every 6 (six) hours as needed for wheezing or shortness of breath. 12/05/15  Yes Arnoldo Morale, MD  amiodarone (PACERONE) 200 MG tablet Take 1 tablet (200 mg total) by mouth daily. 12/01/15  Yes Maryann Mikhail, DO  amLODipine (NORVASC) 5 MG tablet Take 1 tablet (5 mg total) by mouth daily. 12/01/15  Yes Lavina Hamman, MD  atorvastatin (LIPITOR) 80 MG tablet Take 1 tablet (80 mg total) by mouth every evening. 10/17/15  Yes Ripudeep Krystal Eaton, MD  carvedilol (  COREG) 12.5 MG tablet Take 12.5 mg by mouth 2 (two) times daily with a meal.   Yes Historical Provider, MD  clopidogrel (PLAVIX) 75 MG tablet Take 1 tablet (75 mg total) by mouth daily with breakfast. 11/06/15  Yes Geradine Girt, DO  Dexlansoprazole (DEXILANT) 30 MG capsule Take 1 capsule (30 mg total) by mouth daily. 12/30/14  Yes Josalyn Funches, MD  digoxin (LANOXIN) 0.125 MG tablet Take 0.125 mg by mouth daily.   Yes Historical Provider, MD  diltiazem (DILACOR XR) 240 MG 24 hr capsule Take 240 mg by mouth daily.   Yes Historical Provider, MD  Elastic Bandages & Supports (MEDICAL COMPRESSION STOCKINGS) MISC 20 mm Hg pressure 01/05/16  Yes Josalyn Funches, MD  furosemide  (LASIX) 40 MG tablet TAKE 2 TABLETS BY MOUTH IN THE A.M AND 1 TABLET IN THE PM  X'S 3 DAYS THEN TAKE 1 TABLET BY MOUTH TWICE A DAY Patient taking differently: Take 120 mg by mouth 2 (two) times daily. TAKE 2 TABLETS BY MOUTH IN THE A.M AND 1 TABLET IN THE PM  X'S 3 DAYS THEN TAKE 1 TABLET BY MOUTH TWICE A DAY 01/07/16  Yes Einar Pheasant Hager, PA-C  glipiZIDE (GLUCOTROL) 10 MG tablet Take 1 tablet (10 mg total) by mouth 2 (two) times daily before a meal. 01/05/16  Yes Josalyn Funches, MD  hydrALAZINE (APRESOLINE) 50 MG tablet Take 50 mg by mouth 3 (three) times daily.   Yes Historical Provider, MD  ipratropium-albuterol (DUONEB) 0.5-2.5 (3) MG/3ML SOLN Take 3 mLs by nebulization 2 (two) times daily. 10/17/15  Yes Ripudeep Krystal Eaton, MD  isosorbide mononitrate (IMDUR) 30 MG 24 hr tablet Take 1 tablet (30 mg total) by mouth daily. 11/11/15  Yes Arnoldo Morale, MD  lisinopril (PRINIVIL,ZESTRIL) 20 MG tablet Take 20 mg by mouth daily.   Yes Historical Provider, MD  potassium chloride SA (K-DUR,KLOR-CON) 20 MEQ tablet TAKE 1 TABLET BY MOUTH TWICE A DAY X'S 3 DAYS THEN TAKE 1 TABLET DAILY 01/07/16  Yes Brett Canales, PA-C  rivaroxaban (XARELTO) 20 MG TABS tablet Take 1 tablet (20 mg total) by mouth daily with supper. Patient taking differently: Take 20 mg by mouth daily.  02/25/15  Yes Dayna N Dunn, PA-C  spironolactone (ALDACTONE) 25 MG tablet Take 1 tablet (25 mg total) by mouth 2 (two) times daily. 01/05/16  Yes Josalyn Funches, MD  traMADol (ULTRAM) 50 MG tablet Take 1 tablet (50 mg total) by mouth every 6 (six) hours as needed for severe pain. 11/25/15  Yes Arnoldo Morale, MD  blood glucose meter kit and supplies KIT Dispense based on patient and insurance preference. Use up to four times daily as directed. (FOR ICD-9 250.00, 250.01). 01/29/16   Florencia Reasons, MD  insulin aspart (NOVOLOG FLEXPEN) 100 UNIT/ML FlexPen Before each meal 3 times a day, 140-199 - 2 units, 200-250 - 4 units, 251-299 - 6 units,  300-349 - 8 units,  350 or  above 10 units. Insulin PEN if approved, provide syringes and needles if needed. 01/29/16   Florencia Reasons, MD  Insulin Glargine (LANTUS SOLOSTAR) 100 UNIT/ML Solostar Pen Inject 20 Units into the skin daily at 10 pm. 01/29/16   Florencia Reasons, MD  nitroGLYCERIN (NITROSTAT) 0.4 MG SL tablet Place 1 tablet (0.4 mg total) under the tongue every 5 (five) minutes as needed for chest pain (up to 3 doses). 02/25/15   Charlie Pitter, PA-C    Past Medical History: Past Medical History  Diagnosis Date  .  Diabetes mellitus with complication ([place])   . Hypertension   . Syncope     a. 02/2015 - felt 2/2 rapid AF/AFL.  Marland Kitchen Atrial fibrillation and flutter ([place])     a. Dx 02/2015 - placed on Xarelto; b. 09/2015 recurrence in setting of PNA.  . Ischemic cardiomyopathy     a. 09/2015 Echo: EF 35-40%.  Marland Kitchen CAD (coronary artery disease)     a. Dx 02/2015 - 2V CAD with CTO LAD, diffuse dz in PDA, OM - med rx.  . Hypertensive heart disease   . Hyperlipidemia   . Mitral regurgitation     a. Mod by echo 02/2015,  . Tobacco abuse   . Chronic systolic CHF (congestive heart failure) ([place])     a. 09/2015 Echo: EF 35-40%, sev apical/antlat, apical HK, mild Sean, mildly dil LA.  Marland Kitchen ARF (acute respiratory failure) ([place])   . Pneumonia 09/2015  . GERD (gastroesophageal reflux disease)   . Headache   . Arthritis     ra  . NSTEMI (non-ST elevated myocardial infarction) ([place]) 10/2015    Past Surgical History: Past Surgical History  Procedure Laterality Date  . Right thumb surgery     . Cardiac catheterization N/A 02/24/2015    Procedure: Left Heart Cath and Coronary Angiography;  Surgeon: Leonie Man, MD;  Location: [facility] INVASIVE CV LAB CUPID;  Service: Cardiovascular;  Laterality: N/A;  . Cardiac catheterization  02/24/2015    Procedure: Coronary/Bypass Graft CTO Intervention;  Surgeon: Leonie Man, MD;  Location: [facility] INVASIVE CV LAB CUPID;  Service: Cardiovascular;;  . Cardiac catheterization N/A 11/03/2015    Procedure: Left Heart Cath  and Coronary Angiography;  Surgeon: Sherren Mocha, MD;  Location: [place] CV LAB;  Service: Cardiovascular;  Laterality: N/A;  . Cardiac catheterization N/A 11/03/2015    Procedure: Coronary Stent Intervention;  Surgeon: Sherren Mocha, MD;  Location: [place] CV LAB;  Service: Cardiovascular;  Laterality: N/A;  RAMUS    Family History: Family History  Problem Relation Age of Onset  . Diabetes Mother   . Hypertension Mother   . Heart disease Mother   . Cancer Mother     stomach cancer   . Diabetes Father   . Hypertension Father   . Heart disease Father   . Diabetes Sister   . Hypertension Sister   . Cancer Sister     breast cancer   . Diabetes Brother   . Hypertension Brother   . Heart disease Brother   . Stroke Brother   . Heart attack Mother   . Heart attack Father   . Heart attack Brother     Social History: Social History   Social History  . Marital Status: Single    Spouse Name: N/A  . Number of Children: N/A  . Years of Education: N/A   Social History Main Topics  . Smoking status: Current Some Day Smoker -- 0.10 packs/day for 30 years    Types: Cigarettes  . Smokeless tobacco: Never Used  . Alcohol Use: No  . Drug Use: Yes    Special: Cocaine     Comment: "last use 1991", Positive UDS 10/2015./last cocaine use on 10/19/2015  . Sexual Activity: Not Asked   Other Topics Concern  . None   Social History Narrative    Allergies:  No Known Allergies  Objective:    Vital Signs:   Temp:  [97.6 F (36.4 C)-98.2 F (36.8 C)] 98.2 F (36.8 C) (04/06 0407) Pulse Rate:  [  72-73] 73 (04/06 1000) Resp:  [16] 16 (04/06 0407) BP: (104-112)/(76-90) 108/90 mmHg (04/06 1000) SpO2:  [98 %-100 %] 100 % (04/06 0407) Weight:  [168 lb (76.204 kg)] 168 lb (76.204 kg) (04/06 0407) Last BM Date: 01/26/16  Weight change: Filed Weights   01/27/16 0637 01/28/16 0426 01/29/16 0407  Weight: 171 lb 6.4 oz (77.747 kg) 166 lb 1.6 oz (75.342 kg) 168 lb (76.204 kg)     Intake/Output:   Intake/Output Summary (Last 24 hours) at 01/29/16 1239 Last data filed at 01/29/16 1000  Gross per 24 hour  Intake   1400 ml  Output   2140 ml  Net   -740 ml     Physical Exam: General:  Well appearing. No resp difficulty HEENT: normal Neck: supple. JVP ~10. Carotids 2+ bilat; no bruits. No lymphadenopathy or thryomegaly appreciated. Cor: PMI nondisplaced. Regular rate & rhythm. No rubs, gallops or murmurs. Lungs: clear Abdomen: soft, nontender, nondistended. No hepatosplenomegaly. No bruits or masses. Good bowel sounds. Extremities: no cyanosis, clubbing, rash,  R and LLE 1+  edema Neuro: alert & orientedx3, cranial nerves grossly intact. moves all 4 extremities w/o difficulty. Affect pleasant  Telemetry: NSR 70s  Labs: Basic Metabolic Panel:  Recent Labs Lab 01/26/16 1756 01/27/16 0328 01/28/16 0218 01/29/16 0433  NA 135 133* 140 135  K 4.7 3.9 3.4* 4.3  CL 98* 99* 104 101  CO2 _0 GLUCOSE 608* 233* 226* 369*  BUN 21* 25* 27* 30*  CREATININE 1.42* 1.32* 1.37* 1.51*  CALCIUM 9.1 8.3* 8.4* 8.5*  MG  --   --  1.6*  --     Liver Function Tests:  Recent Labs Lab 01/26/16 1756 01/28/16 0218  AST 39 33  ALT 76* 55  ALKPHOS 242* 171*  BILITOT 1.7* 0.8  PROT 6.3* 4.7*  ALBUMIN 2.8* 2.1*   No results for input(s): LIPASE, AMYLASE in the last 168 hours. No results for input(s): AMMONIA in the last 168 hours.  CBC:  Recent Labs Lab 01/26/16 1846  WBC 10.3  NEUTROABS 8.4*  HGB 14.4  HCT 43.2  MCV 86.9  PLT 216    Cardiac Enzymes: No results for input(s): CKTOTAL, CKMB, CKMBINDEX, TROPONINI in the last 168 hours.  BNP: BNP (last 3 results)  Recent Labs  11/30/15 0335 12/09/15 0906 01/26/16 1848  BNP 870.4* 795.1* 1378.3*    ProBNP (last 3 results)  Recent Labs  01/06/16 1138  PROBNP 1491.0*     CBG:  Recent Labs Lab 01/29/16 0003 01/29/16 0622 01/29/16 0853 01/29/16 1055 01/29/16 1122  GLUCAP  165* 332* 111* 50* 131*    Coagulation Studies:  Recent Labs  01/26/16 1756  LABPROT 21.0*  INR 1.82*    Other results: EKG:   Imaging: Dg Chest Port 1 View  01/27/2016  CLINICAL DATA:  Short of breath EXAM: PORTABLE CHEST 1 VIEW COMPARISON:  12/09/2015 FINDINGS: Upper normal heart size. Focal patchy density in the right mid lung zone persists. Small right pleural effusion has developed. No pneumothorax. Patchy bibasilar airspace opacities have improved. IMPRESSION: Patchy bibasilar airspace opacities have improved. Patchy right midlung opacity persists. Continued followup until complete resolution is recommended. If the abnormality fails to resolve, underlying nodule cannot be excluded. Electronically Signed   By: Marybelle Killings M.D.   On: 01/27/2016 13:31      Medications:     Current Medications: . amiodarone  200 mg Oral Daily  . amLODipine  5 mg Oral Daily  .  atorvastatin  80 mg Oral QPM  . carvedilol  12.5 mg Oral BID WC  . clopidogrel  75 mg Oral Q breakfast  . digoxin  0.125 mg Oral Daily  . diltiazem  240 mg Oral Daily  . furosemide  80 mg Oral BID  . hydrALAZINE  25 mg Oral TID  . insulin aspart  0-15 Units Subcutaneous 6 times per day  . insulin glargine  20 Units Subcutaneous QHS  . isosorbide mononitrate  30 mg Oral Daily  . lisinopril  20 mg Oral Daily  . pantoprazole  40 mg Oral Daily  . potassium chloride SA  20 mEq Oral BID  . rivaroxaban  20 mg Oral Daily  . sodium chloride flush  3 mL Intravenous Q12H  . spironolactone  25 mg Oral BID     Infusions:      Assessment/Plan/Discussion   Todays Reds Vest reading is 45 so HF team consulted.   Sean Hinton is a 55 year old with  a history of ICM w/ EF 40-45% on ECHO 10/2015, PAF on Xarelto, HTN, HLD, DM, cocaine hx, tobacco use, CAD w/ CTO LAD, DES RI 10/2015. Admitted 04/03 w/ A/C HF and  uncontrolled DM.  1. A/C Systolic HF- ICM January 7341. ECHO EF 40-45%. Volume status remains elevated despite  diuresis with IV lasix. Yesterday he was transitioned to po lasix. Todays Reds Vest reading is 45. He was encouraged to stay for another 24 hours but he and his wife are adamant that they are going home today. For that reason I will give 80 mg IV lasix now then he will start lasix 80 mg po twice a day. Continue 25 mg spiro daily. Continue current dose of carvedilol, lisinopril, hydralazine/imdur.  Educated on daily weight and limiting fluid intake to < 2 liters per day.  2. Uncontrolled DM- per [facility]. Has follow up with internal medicine.  3. CAD- Had LHC 10/2015  -Mid LAD lesion, 100% stenosed. There is a 100% residual stenosis post intervention.RPDA 1-lesion, 50% stenosed.RPDA-2 lesion, 70% stenosed.Ost 1st Mrg to 1st Mrg lesion, 80% stenosed. Very small vessel On  plavix and xarelto for minimum 12 months.  4. Cocaine Abuse- + UDS . Counseled that he needs to stop.  5. PAF- In NSR. Continue amio 200 mg daily. On Xarelto.  6. Severe Sean- not thought to be a candidate MV surgery with noncompliance and substance abuse.  In future, could consider MV repair versus MV clip.   I have set up follow up in the HF clinic set up for next week. I have asked him to bring all medications to his appointment next week. Plan to check BMET at that time.   Length of Stay: 3  Amy Clegg NP-C  01/29/2016, 12:39 PM  Advanced Heart Failure Team Pager [phone] (M-F; 7a - 4p)  Please contact [facility] for night-coverage after hours (4p -7a ) and weekends on [email or web address]  Patient seen with NP, agree with the above note.  ReDS vest reading elevated today at 45.  He is still volume overloaded on exam.  I think that he would benefit from further IV diuresis, but he is quite insistent that he go home today.  Therefore, would give him Lasix 80 mg IV x 1 and send him home on higher Lasix dose, 80 mg po bid.  He needs to stop using cocaine.  We can have him followup in CHF clinic next week.   Loralie Champagne 01/29/2016 2:34  PM

## 2016-01-29 NOTE — Progress Notes (Signed)
ReDS Vest Discharge Study  Results of ReDS reading  Your patient is in the Unblinded arm of the Vest at Discharge study.    The ReDS reading is:  ( >39)   45  Your patient will not be discharged at this time and will have a AHF team consult.  Thank You   The research team

## 2016-01-29 NOTE — Progress Notes (Signed)
Seen by [facility] cardology > Pt unable to meet Vest regulation . Lasix 80 mgs given as ordered Discharge released by [facility] Discharge instruction given to Pt verbalized understanding . Wheeled to lobby by NT

## 2016-01-30 ENCOUNTER — Telehealth: Payer: Self-pay

## 2016-01-30 DIAGNOSIS — Z794 Long term (current) use of insulin: Principal | ICD-10-CM

## 2016-01-30 DIAGNOSIS — E119 Type 2 diabetes mellitus without complications: Secondary | ICD-10-CM

## 2016-01-30 MED ORDER — GLUCOSE BLOOD VI STRP: ORAL_STRIP | Status: AC

## 2016-01-30 MED ORDER — CLOPIDOGREL BISULFATE 75 MG PO TABS: 75.0000 mg | ORAL_TABLET | Freq: Every day | ORAL | Status: AC

## 2016-01-30 MED ORDER — TRUE METRIX METER DEVI: 1.0000 | Freq: Three times a day (TID) | Status: AC

## 2016-01-30 MED ORDER — TRUEPLUS LANCETS 28G MISC: 1.0000 | Freq: Three times a day (TID) | Status: AC

## 2016-01-30 NOTE — Telephone Encounter (Signed)
[facility] Post-discharge Follow-Up Phone Call:  Date of Discharge: 01/29/16 Principal Discharge Diagnosis(es):Acute on chronic systolic congestive heart failure Call Completed: No                   With Whom: Patient    Please check all that apply:  X  Patient is knowledgeable of his/her condition(s) and/or treatment. X  Patient is caring for self at home.  ? Patient is receiving assist at home from family and/or caregiver. Family and/or caregiver is knowledgeable of patient's condition(s) and/or treatment. ? Patient is receiving home health services. If so, name of agency.     Medication Reconciliation:  X  Patient obtained all discharge medications-NO. Patient indicated he has not yet picked up discharge medications, but indicated he plans to pick up medication today. He indicated he also needs a refill of Plavix 75 mg daily because he is completely out of medication. This Case Manager discussed with Dr. Venetia Night who gave verbal order for medication to be reordered with 3 refills. Medication reordered per verbal order. Also spoke with Tarri Fuller, Pharmacist at [facility] and [facility], who indicated she received scripts for all discharge orders for medications except for Novolog and glucometer which were printed. Order for glucometer incorrect per pharmacy. This Case Manager ordered True Metrix meter, strips, and lancets for patient per verbal order from Dr. Venetia Night. This Case Manager called patient to provide update and also to determine if patient was given script for Novolog at discharge. If not, Dr. Venetia Night gave verbal order for medication to be reordered. Awaiting return call from patient.   Activities of Daily Living:  X  Independent ? Needs assist  ? Total Care    Community resources in place for patient:  X  None  ? Home Health/Home DME ? Assisted Living ? Support Group               Questions/Concerns discussed: This Case Manager placed call to patient to  complete discharge follow-up phone call. Inquired about status. Patient indicated he was "tired." He indicated swelling was decreasing in his lower extremities and his "legs and ankles are looking really good."  He indicated he was having less shortness of breath than while he was in the hospital. Reminded patient of his upcoming appointment with Dr. Venetia Night on 02/03/16 at 1030. Patient indicated he was aware how to check blood glucose, and he informed Robyne Peers, RN CM he can receive assistance with insulin administration from significant other if needed. Discussed benefit for continued diabetes education as patient newly diagnosed with type 2 diabetes mellitus. Patient agreeable to continued diabetes education and appointment scheduled for 02/03/16 at 1200 with Juanita Craver, RN CM. Patient indicated he would have transportation to his upcoming appointment. Awaiting return call from patient to provide update on medications.

## 2016-02-02 ENCOUNTER — Telehealth: Payer: Self-pay

## 2016-02-02 NOTE — Telephone Encounter (Signed)
Call placed to the patient to check on his status and to confirm his appointment at the [facility] tomorrow, 02/03/16 @ 1030 and he then has an appointment with Juanita Craver, RPH at 1200. He said that he will be at his appointments and has transportation to the clinic.  He stated that he is feeling " fine", no problems breathing and he is " breathing better."  He said that he has not picked up his discharge medications at [facility]  yet and he has a hard copy of a prescription for novolog.  He noted that he has been taking the lantus as ordered and he has been checking his blood sugars every day. He does not have any novolog. Instructed him regarding the importance of getting all of his medications and he stated that he would get them tomorrow.  He reported that is blood sugar this morning was 149 and the blood sugars  have been running 140's-150's. He said that he has been keeping a log of his blood sugars. Instructed him to bring all of his medications and his blood sugar log to his appointment tomorrow and he stated that he would. He said that he needs to pick up his glucometer tomorrow.  No other concerns /questions reported.

## 2016-02-03 ENCOUNTER — Ambulatory Visit: Payer: Self-pay | Admitting: Pharmacist

## 2016-02-03 ENCOUNTER — Inpatient Hospital Stay: Payer: Self-pay | Admitting: Family Medicine

## 2016-02-04 ENCOUNTER — Ambulatory Visit: Payer: Self-pay | Admitting: Pulmonary Disease

## 2016-02-05 ENCOUNTER — Inpatient Hospital Stay (HOSPITAL_COMMUNITY): Payer: Self-pay

## 2016-02-06 ENCOUNTER — Emergency Department (HOSPITAL_COMMUNITY): Payer: Medicaid Other

## 2016-02-06 ENCOUNTER — Inpatient Hospital Stay (HOSPITAL_COMMUNITY)
Admission: EM | Admit: 2016-02-06 | Discharge: 2016-02-10 | DRG: 286 | Disposition: A | Discharge status: Home / Self Care | Payer: Medicaid Other | Attending: Cardiovascular Disease | Admitting: Cardiovascular Disease

## 2016-02-06 ENCOUNTER — Encounter (HOSPITAL_COMMUNITY): Payer: Self-pay | Admitting: Emergency Medicine

## 2016-02-06 ENCOUNTER — Encounter (HOSPITAL_COMMUNITY)
Admission: EM | Disposition: A | Discharge status: Home / Self Care | Payer: Self-pay | Source: Home / Self Care | Attending: Cardiovascular Disease

## 2016-02-06 DIAGNOSIS — I959 Hypotension, unspecified: Secondary | ICD-10-CM | POA: Diagnosis not present

## 2016-02-06 DIAGNOSIS — I5043 Acute on chronic combined systolic (congestive) and diastolic (congestive) heart failure: Secondary | ICD-10-CM | POA: Diagnosis present

## 2016-02-06 DIAGNOSIS — N179 Acute kidney failure, unspecified: Secondary | ICD-10-CM | POA: Diagnosis not present

## 2016-02-06 DIAGNOSIS — I255 Ischemic cardiomyopathy: Secondary | ICD-10-CM | POA: Diagnosis present

## 2016-02-06 DIAGNOSIS — I2510 Atherosclerotic heart disease of native coronary artery without angina pectoris: Secondary | ICD-10-CM

## 2016-02-06 DIAGNOSIS — Z7902 Long term (current) use of antithrombotics/antiplatelets: Secondary | ICD-10-CM | POA: Diagnosis not present

## 2016-02-06 DIAGNOSIS — I2582 Chronic total occlusion of coronary artery: Secondary | ICD-10-CM | POA: Diagnosis present

## 2016-02-06 DIAGNOSIS — Z8249 Family history of ischemic heart disease and other diseases of the circulatory system: Secondary | ICD-10-CM | POA: Diagnosis not present

## 2016-02-06 DIAGNOSIS — I252 Old myocardial infarction: Secondary | ICD-10-CM | POA: Diagnosis not present

## 2016-02-06 DIAGNOSIS — R339 Retention of urine, unspecified: Secondary | ICD-10-CM | POA: Diagnosis present

## 2016-02-06 DIAGNOSIS — R042 Hemoptysis: Secondary | ICD-10-CM | POA: Diagnosis present

## 2016-02-06 DIAGNOSIS — Z833 Family history of diabetes mellitus: Secondary | ICD-10-CM | POA: Diagnosis not present

## 2016-02-06 DIAGNOSIS — Z955 Presence of coronary angioplasty implant and graft: Secondary | ICD-10-CM | POA: Diagnosis not present

## 2016-02-06 DIAGNOSIS — I081 Rheumatic disorders of both mitral and tricuspid valves: Secondary | ICD-10-CM | POA: Diagnosis present

## 2016-02-06 DIAGNOSIS — R1031 Right lower quadrant pain: Secondary | ICD-10-CM

## 2016-02-06 DIAGNOSIS — I110 Hypertensive heart disease with heart failure: Secondary | ICD-10-CM | POA: Diagnosis present

## 2016-02-06 DIAGNOSIS — I509 Heart failure, unspecified: Secondary | ICD-10-CM

## 2016-02-06 DIAGNOSIS — I4892 Unspecified atrial flutter: Secondary | ICD-10-CM | POA: Diagnosis present

## 2016-02-06 DIAGNOSIS — R079 Chest pain, unspecified: Secondary | ICD-10-CM

## 2016-02-06 DIAGNOSIS — I214 Non-ST elevation (NSTEMI) myocardial infarction: Secondary | ICD-10-CM

## 2016-02-06 DIAGNOSIS — E1165 Type 2 diabetes mellitus with hyperglycemia: Secondary | ICD-10-CM | POA: Diagnosis present

## 2016-02-06 DIAGNOSIS — K210 Gastro-esophageal reflux disease with esophagitis: Secondary | ICD-10-CM | POA: Diagnosis present

## 2016-02-06 DIAGNOSIS — I480 Paroxysmal atrial fibrillation: Secondary | ICD-10-CM | POA: Diagnosis present

## 2016-02-06 DIAGNOSIS — F1410 Cocaine abuse, uncomplicated: Secondary | ICD-10-CM | POA: Diagnosis present

## 2016-02-06 DIAGNOSIS — I248 Other forms of acute ischemic heart disease: Secondary | ICD-10-CM | POA: Diagnosis present

## 2016-02-06 DIAGNOSIS — Z794 Long term (current) use of insulin: Secondary | ICD-10-CM

## 2016-02-06 DIAGNOSIS — F17210 Nicotine dependence, cigarettes, uncomplicated: Secondary | ICD-10-CM | POA: Diagnosis present

## 2016-02-06 DIAGNOSIS — E785 Hyperlipidemia, unspecified: Secondary | ICD-10-CM | POA: Diagnosis present

## 2016-02-06 HISTORY — PX: CARDIAC CATHETERIZATION: SHX172

## 2016-02-06 LAB — CBC WITH DIFFERENTIAL/PLATELET
BASOS ABS: 0 10*3/uL (ref 0.0–0.1)
BASOS PCT: 0 %
Eosinophils Absolute: 0 10*3/uL (ref 0.0–0.7)
Eosinophils Relative: 0 %
HEMATOCRIT: 40.7 % (ref 39.0–52.0)
Hemoglobin: 13.2 g/dL (ref 13.0–17.0)
Lymphocytes Relative: 18 %
Lymphs Abs: 2.1 10*3/uL (ref 0.7–4.0)
MCH: 28.3 pg (ref 26.0–34.0)
MCHC: 32.4 g/dL (ref 30.0–36.0)
MCV: 87.2 fL (ref 78.0–100.0)
MONO ABS: 0.6 10*3/uL (ref 0.1–1.0)
Monocytes Relative: 5 %
NEUTROS ABS: 8.9 10*3/uL — AB (ref 1.7–7.7)
Neutrophils Relative %: 77 %
PLATELETS: 184 10*3/uL (ref 150–400)
RBC: 4.67 MIL/uL (ref 4.22–5.81)
RDW: 14 % (ref 11.5–15.5)
WBC: 11.6 10*3/uL — ABNORMAL HIGH (ref 4.0–10.5)

## 2016-02-06 LAB — RAPID URINE DRUG SCREEN, HOSP PERFORMED
AMPHETAMINES: NOT DETECTED
BARBITURATES: NOT DETECTED
Benzodiazepines: NOT DETECTED
Cocaine: POSITIVE — AB
Opiates: POSITIVE — AB
TETRAHYDROCANNABINOL: NOT DETECTED

## 2016-02-06 LAB — BASIC METABOLIC PANEL
ANION GAP: 14 (ref 5–15)
Anion gap: 11 (ref 5–15)
BUN: 13 mg/dL (ref 6–20)
BUN: 12 mg/dL (ref 6–20)
CALCIUM: 9.1 mg/dL (ref 8.9–10.3)
CHLORIDE: 103 mmol/L (ref 101–111)
CO2: 22 mmol/L (ref 22–32)
CO2: 21 mmol/L — AB (ref 22–32)
Calcium: 8.8 mg/dL — ABNORMAL LOW (ref 8.9–10.3)
Chloride: 105 mmol/L (ref 101–111)
Creatinine, Ser: 1.23 mg/dL (ref 0.61–1.24)
Creatinine, Ser: 1.2 mg/dL (ref 0.61–1.24)
GFR calc Af Amer: >60 mL/min (ref >60)
GFR calc Af Amer: >60 mL/min (ref >60)
GFR calc non Af Amer: >60 mL/min (ref >60)
GFR calc non Af Amer: >60 mL/min (ref >60)
GLUCOSE: 353 mg/dL — AB (ref 65–99)
Glucose, Bld: 341 mg/dL — ABNORMAL HIGH (ref 65–99)
POTASSIUM: 4.9 mmol/L (ref 3.5–5.1)
Potassium: 4.1 mmol/L (ref 3.5–5.1)
Sodium: 138 mmol/L (ref 135–145)
Sodium: 138 mmol/L (ref 135–145)

## 2016-02-06 LAB — MRSA PCR SCREENING: MRSA by PCR: POSITIVE — AB

## 2016-02-06 LAB — LIPID PANEL
CHOL/HDL RATIO: 2.9 ratio
Cholesterol: 169 mg/dL (ref 0–200)
HDL: 58 mg/dL (ref >40)
LDL CALC: 91 mg/dL (ref 0–99)
Triglycerides: 102 mg/dL (ref <150)
VLDL: 20 mg/dL (ref 0–40)

## 2016-02-06 LAB — TROPONIN I
TROPONIN I: 0.11 ng/mL — AB (ref <0.031)
TROPONIN I: 0.12 ng/mL — AB (ref <0.031)
Troponin I: 0.11 ng/mL — ABNORMAL HIGH (ref <0.031)
Troponin I: 0.13 ng/mL — ABNORMAL HIGH (ref <0.031)

## 2016-02-06 LAB — GLUCOSE, CAPILLARY
GLUCOSE-CAPILLARY: 285 mg/dL — AB (ref 65–99)
GLUCOSE-CAPILLARY: 274 mg/dL — AB (ref 65–99)
GLUCOSE-CAPILLARY: 280 mg/dL — AB (ref 65–99)
Glucose-Capillary: 359 mg/dL — ABNORMAL HIGH (ref 65–99)
Glucose-Capillary: 305 mg/dL — ABNORMAL HIGH (ref 65–99)

## 2016-02-06 LAB — CBC
HEMATOCRIT: 39.8 % (ref 39.0–52.0)
Hemoglobin: 13.2 g/dL (ref 13.0–17.0)
MCH: 29.1 pg (ref 26.0–34.0)
MCHC: 33.2 g/dL (ref 30.0–36.0)
MCV: 87.9 fL (ref 78.0–100.0)
PLATELETS: 179 10*3/uL (ref 150–400)
RBC: 4.53 MIL/uL (ref 4.22–5.81)
RDW: 14.4 % (ref 11.5–15.5)
WBC: 11.7 10*3/uL — AB (ref 4.0–10.5)

## 2016-02-06 LAB — MAGNESIUM: MAGNESIUM: 1.7 mg/dL (ref 1.7–2.4)

## 2016-02-06 LAB — BRAIN NATRIURETIC PEPTIDE: B Natriuretic Peptide: 1224.6 pg/mL — ABNORMAL HIGH (ref 0.0–100.0)

## 2016-02-06 LAB — I-STAT TROPONIN, ED: Troponin i, poc: 0.16 ng/mL (ref 0.00–0.08)

## 2016-02-06 LAB — PLATELET INHIBITION P2Y12: PLATELET FUNCTION P2Y12: 306 [PRU] (ref 194–418)

## 2016-02-06 LAB — PROTIME-INR
INR: 1.43 (ref 0.00–1.49)
Prothrombin Time: 17.6 seconds — ABNORMAL HIGH (ref 11.6–15.2)

## 2016-02-06 SURGERY — LEFT HEART CATH AND CORONARY ANGIOGRAPHY
Anesthesia: LOCAL

## 2016-02-06 MED ORDER — SODIUM CHLORIDE 0.9 % WEIGHT BASED INFUSION
3.0000 mL/kg/h | INTRAVENOUS | Status: AC
  Administered 2016-02-06: 2.046 mL/kg/h via INTRAVENOUS

## 2016-02-06 MED ORDER — LIDOCAINE HCL (PF) 1 % IJ SOLN
INTRAMUSCULAR | Status: AC
  Filled 2016-02-06: qty 30

## 2016-02-06 MED ORDER — LORAZEPAM 2 MG/ML IJ SOLN
INTRAMUSCULAR | Status: AC
  Administered 2016-02-06: 1 mg via INTRAVENOUS
  Filled 2016-02-06: qty 1

## 2016-02-06 MED ORDER — NITROGLYCERIN 2 % TD OINT
1.0000 [in_us] | TOPICAL_OINTMENT | Freq: Four times a day (QID) | TRANSDERMAL | Status: DC
  Administered 2016-02-06: 1 [in_us] via TOPICAL
  Filled 2016-02-06: qty 1

## 2016-02-06 MED ORDER — LORAZEPAM 2 MG/ML IJ SOLN
1.0000 mg | Freq: Once | INTRAMUSCULAR | Status: AC
  Administered 2016-02-06: 1 mg via INTRAVENOUS

## 2016-02-06 MED ORDER — DILTIAZEM HCL ER 240 MG PO CP24
240.0000 mg | ORAL_CAPSULE | Freq: Every day | ORAL | Status: DC
  Administered 2016-02-06: 240 mg via ORAL
  Filled 2016-02-06 (×2): qty 1

## 2016-02-06 MED ORDER — ISOSORBIDE MONONITRATE ER 30 MG PO TB24
30.0000 mg | ORAL_TABLET | Freq: Every day | ORAL | Status: DC
  Administered 2016-02-06: 30 mg via ORAL
  Filled 2016-02-06: qty 1

## 2016-02-06 MED ORDER — ONDANSETRON HCL 4 MG/2ML IJ SOLN: 4.0000 mg | Freq: Four times a day (QID) | INTRAMUSCULAR | Status: DC | PRN

## 2016-02-06 MED ORDER — IOPAMIDOL (ISOVUE-370) INJECTION 76%
INTRAVENOUS | Status: DC | PRN
  Administered 2016-02-06: 60 mL via INTRA_ARTERIAL

## 2016-02-06 MED ORDER — RIVAROXABAN 20 MG PO TABS: 20.0000 mg | ORAL_TABLET | Freq: Every day | ORAL | Status: DC

## 2016-02-06 MED ORDER — NOREPINEPHRINE BITARTRATE 1 MG/ML IV SOLN
0.0000 ug/min | INTRAVENOUS | Status: DC
  Administered 2016-02-06: 10 ug/min via INTRAVENOUS
  Filled 2016-02-06 (×3): qty 4

## 2016-02-06 MED ORDER — SODIUM CHLORIDE 0.9 % IV BOLUS (SEPSIS)
250.0000 mL | Freq: Once | INTRAVENOUS | Status: AC
  Administered 2016-02-06: 250 mL via INTRAVENOUS

## 2016-02-06 MED ORDER — SPIRONOLACTONE 25 MG PO TABS
25.0000 mg | ORAL_TABLET | Freq: Two times a day (BID) | ORAL | Status: DC
  Administered 2016-02-06: 25 mg via ORAL
  Filled 2016-02-06: qty 1

## 2016-02-06 MED ORDER — HEPARIN (PORCINE) IN NACL 2-0.9 UNIT/ML-% IJ SOLN
INTRAMUSCULAR | Status: AC
  Filled 2016-02-06: qty 1500

## 2016-02-06 MED ORDER — ACETAMINOPHEN 325 MG PO TABS
650.0000 mg | ORAL_TABLET | ORAL | Status: DC | PRN
  Administered 2016-02-06 – 2016-02-10 (×9): 650 mg via ORAL
  Filled 2016-02-06 (×9): qty 2

## 2016-02-06 MED ORDER — HEPARIN (PORCINE) IN NACL 2-0.9 UNIT/ML-% IJ SOLN
INTRAMUSCULAR | Status: DC | PRN
  Administered 2016-02-06: 20 mL

## 2016-02-06 MED ORDER — CLOPIDOGREL BISULFATE 75 MG PO TABS
75.0000 mg | ORAL_TABLET | Freq: Every day | ORAL | Status: DC
  Administered 2016-02-06 – 2016-02-10 (×5): 75 mg via ORAL
  Filled 2016-02-06 (×5): qty 1

## 2016-02-06 MED ORDER — POTASSIUM CHLORIDE CRYS ER 20 MEQ PO TBCR
20.0000 meq | EXTENDED_RELEASE_TABLET | Freq: Two times a day (BID) | ORAL | Status: DC
  Administered 2016-02-06 (×2): 20 meq via ORAL
  Filled 2016-02-06 (×3): qty 1

## 2016-02-06 MED ORDER — NOREPINEPHRINE BITARTRATE 1 MG/ML IV SOLN
0.0000 ug/min | INTRAVENOUS | Status: DC
  Filled 2016-02-06: qty 16

## 2016-02-06 MED ORDER — AMIODARONE HCL IN DEXTROSE 360-4.14 MG/200ML-% IV SOLN
60.0000 mg/h | INTRAVENOUS | Status: AC
  Administered 2016-02-06: 60 mg/h via INTRAVENOUS
  Filled 2016-02-06 (×2): qty 200

## 2016-02-06 MED ORDER — PANTOPRAZOLE SODIUM 40 MG PO TBEC
40.0000 mg | DELAYED_RELEASE_TABLET | Freq: Every day | ORAL | Status: DC
  Administered 2016-02-06 – 2016-02-10 (×5): 40 mg via ORAL
  Filled 2016-02-06 (×6): qty 1

## 2016-02-06 MED ORDER — HYDRALAZINE HCL 25 MG PO TABS
25.0000 mg | ORAL_TABLET | Freq: Three times a day (TID) | ORAL | Status: DC
  Administered 2016-02-06: 25 mg via ORAL
  Filled 2016-02-06: qty 1

## 2016-02-06 MED ORDER — METOCLOPRAMIDE HCL 5 MG/ML IJ SOLN
10.0000 mg | Freq: Once | INTRAMUSCULAR | Status: AC
  Administered 2016-02-06: 10 mg via INTRAVENOUS
  Filled 2016-02-06: qty 2

## 2016-02-06 MED ORDER — NITROGLYCERIN IN D5W 200-5 MCG/ML-% IV SOLN
2.0000 ug/min | INTRAVENOUS | Status: DC
Start: 2016-02-06 — End: 2016-02-07
  Administered 2016-02-06: 3 ug/min via INTRAVENOUS
  Filled 2016-02-06: qty 250

## 2016-02-06 MED ORDER — DEXTROSE 5 % IV SOLN: 60.0000 mg/h | Freq: Once | INTRAVENOUS | Status: DC

## 2016-02-06 MED ORDER — AMIODARONE HCL IN DEXTROSE 360-4.14 MG/200ML-% IV SOLN
INTRAVENOUS | Status: AC
  Filled 2016-02-06: qty 200

## 2016-02-06 MED ORDER — INSULIN GLARGINE 100 UNIT/ML ~~LOC~~ SOLN
20.0000 [IU] | Freq: Every day | SUBCUTANEOUS | Status: DC
  Administered 2016-02-06: 20 [IU] via SUBCUTANEOUS
  Filled 2016-02-06 (×3): qty 0.2

## 2016-02-06 MED ORDER — INSULIN ASPART 100 UNIT/ML ~~LOC~~ SOLN
2.0000 [IU] | Freq: Three times a day (TID) | SUBCUTANEOUS | Status: DC
  Administered 2016-02-06 (×2): 6 [IU] via SUBCUTANEOUS
  Administered 2016-02-06: 10 [IU] via SUBCUTANEOUS
  Administered 2016-02-07: 2 [IU] via SUBCUTANEOUS
  Administered 2016-02-07: 4 [IU] via SUBCUTANEOUS
  Administered 2016-02-07 – 2016-02-08 (×2): 6 [IU] via SUBCUTANEOUS
  Administered 2016-02-08: 10 [IU] via SUBCUTANEOUS
  Administered 2016-02-09: 2 [IU] via SUBCUTANEOUS
  Administered 2016-02-09 (×2): 4 [IU] via SUBCUTANEOUS
  Administered 2016-02-10 (×2): 2 [IU] via SUBCUTANEOUS

## 2016-02-06 MED ORDER — SODIUM CHLORIDE 0.9 % IV SOLN: 250.0000 mL | INTRAVENOUS | Status: DC | PRN

## 2016-02-06 MED ORDER — SODIUM CHLORIDE 0.9 % IV SOLN: INTRAVENOUS | Status: DC

## 2016-02-06 MED ORDER — PIPERACILLIN-TAZOBACTAM 3.375 G IVPB
3.3750 g | Freq: Three times a day (TID) | INTRAVENOUS | Status: DC
  Administered 2016-02-06 – 2016-02-07 (×3): 3.375 g via INTRAVENOUS
  Filled 2016-02-06 (×7): qty 50

## 2016-02-06 MED ORDER — FUROSEMIDE 80 MG PO TABS
80.0000 mg | ORAL_TABLET | Freq: Two times a day (BID) | ORAL | Status: DC
  Administered 2016-02-06 – 2016-02-10 (×9): 80 mg via ORAL
  Filled 2016-02-06 (×9): qty 1

## 2016-02-06 MED ORDER — IOPAMIDOL (ISOVUE-370) INJECTION 76%
INTRAVENOUS | Status: AC
  Filled 2016-02-06: qty 100

## 2016-02-06 MED ORDER — AMIODARONE HCL IN DEXTROSE 360-4.14 MG/200ML-% IV SOLN
30.0000 mg/h | INTRAVENOUS | Status: DC
  Administered 2016-02-06 – 2016-02-07 (×2): 30 mg/h via INTRAVENOUS
  Filled 2016-02-06 (×3): qty 200

## 2016-02-06 MED ORDER — DIGOXIN 125 MCG PO TABS
0.1250 mg | ORAL_TABLET | Freq: Every day | ORAL | Status: DC
  Administered 2016-02-06 – 2016-02-10 (×5): 0.125 mg via ORAL
  Filled 2016-02-06 (×5): qty 1

## 2016-02-06 MED ORDER — SODIUM CHLORIDE 0.9% FLUSH: 3.0000 mL | INTRAVENOUS | Status: DC | PRN

## 2016-02-06 MED ORDER — AMIODARONE IV BOLUS ONLY 150 MG/100ML
150.0000 mg | Freq: Once | INTRAVENOUS | Status: AC
  Administered 2016-02-06: 150 mg via INTRAVENOUS

## 2016-02-06 MED ORDER — NOREPINEPHRINE BITARTRATE 1 MG/ML IV SOLN: 4.0000 mg | INTRAVENOUS | Status: DC | PRN

## 2016-02-06 MED ORDER — SODIUM CHLORIDE 0.9% FLUSH
3.0000 mL | Freq: Two times a day (BID) | INTRAVENOUS | Status: DC
  Administered 2016-02-06 – 2016-02-10 (×7): 3 mL via INTRAVENOUS

## 2016-02-06 MED ORDER — ATORVASTATIN CALCIUM 80 MG PO TABS
80.0000 mg | ORAL_TABLET | Freq: Every evening | ORAL | Status: DC
  Administered 2016-02-06 – 2016-02-09 (×4): 80 mg via ORAL
  Filled 2016-02-06 (×4): qty 1

## 2016-02-06 MED ORDER — VANCOMYCIN HCL IN DEXTROSE 1-5 GM/200ML-% IV SOLN
1000.0000 mg | Freq: Two times a day (BID) | INTRAVENOUS | Status: DC
  Administered 2016-02-06 – 2016-02-07 (×2): 1000 mg via INTRAVENOUS
  Filled 2016-02-06 (×5): qty 200

## 2016-02-06 MED ORDER — HYDROMORPHONE HCL 1 MG/ML IJ SOLN: 1.0000 mg | Freq: Once | INTRAMUSCULAR | Status: DC

## 2016-02-06 MED ORDER — NOREPINEPHRINE BITARTRATE 1 MG/ML IV SOLN
0.0000 ug/min | INTRAVENOUS | Status: DC
  Administered 2016-02-06: 5 ug/min via INTRAVENOUS
  Filled 2016-02-06: qty 4

## 2016-02-06 MED ORDER — CARVEDILOL 12.5 MG PO TABS
12.5000 mg | ORAL_TABLET | Freq: Two times a day (BID) | ORAL | Status: DC
  Administered 2016-02-06: 12.5 mg via ORAL
  Filled 2016-02-06: qty 1

## 2016-02-06 MED ORDER — LISINOPRIL 20 MG PO TABS
20.0000 mg | ORAL_TABLET | Freq: Every day | ORAL | Status: DC
  Administered 2016-02-06: 20 mg via ORAL
  Filled 2016-02-06: qty 1

## 2016-02-06 MED ORDER — MORPHINE SULFATE (PF) 4 MG/ML IV SOLN
4.0000 mg | Freq: Once | INTRAVENOUS | Status: AC
  Administered 2016-02-06: 4 mg via INTRAVENOUS
  Filled 2016-02-06: qty 1

## 2016-02-06 MED ORDER — RIVAROXABAN 20 MG PO TABS
20.0000 mg | ORAL_TABLET | Freq: Every day | ORAL | Status: DC
  Administered 2016-02-06 – 2016-02-08 (×3): 20 mg via ORAL
  Filled 2016-02-06 (×3): qty 1

## 2016-02-06 SURGICAL SUPPLY — 8 items
CATH INFINITI 5FR MULTPACK ANG (CATHETERS) ×1 IMPLANT
ELECT DEFIB PAD ADLT CADENCE (PAD) ×1 IMPLANT
KIT HEART LEFT (KITS) ×2 IMPLANT
PACK CARDIAC CATHETERIZATION (CUSTOM PROCEDURE TRAY) ×2 IMPLANT
SHEATH PINNACLE 6F 10CM (SHEATH) ×1 IMPLANT
SYR MEDRAD MARK V 150ML (SYRINGE) ×2 IMPLANT
TRANSDUCER W/STOPCOCK (MISCELLANEOUS) ×2 IMPLANT
WIRE EMERALD 3MM-J .035X150CM (WIRE) ×1 IMPLANT

## 2016-02-06 NOTE — Progress Notes (Signed)
Pts pain unchanged 9/10 since admission. BP 88/64. removed nitro paste. Called lab, drawing troponin early, per PA Barrett.  Cardiology paged.

## 2016-02-06 NOTE — H&P (View-Only) (Signed)
Patient Name: Sean Hinton Date of Encounter: 02/06/2016  Principal Problem:   NSTEMI (non-ST elevated myocardial infarction) [facility]) Active Problems:   Acute on chronic combined systolic and diastolic congestive heart failure (HCC)   Cocaine abuse   Type 2 diabetes mellitus with hyperglycemia (HCC)   Atrial flutter [facility]   Primary Cardiologist: Dr Royann Shivers Patient Profile: 55 y.o. male with a history of ICM w/ EF 35-40% 09/2015 echo, PAF on Xarelto, HTN, HLD, DM, cocaine hx, tobacco use, CAD w/ CTO LAD, DES RI 10/2015. D/c 04/06 after admit for CHF, wt at d/c 168 lbs. Admitted 04/14 w/ chest pain, UDS + cocaine, initial rhythm atrial flutter, spont conversion to SR.  SUBJECTIVE: C/o 9/10 chest pain since admission. Sleepy because of Ativan given earlier. Rx given at approx 07:45 am included lisinopril 20 mg, hydralazine 25 mg, Lasix 80 mg, Coreg 12.5 mg, Cardizem 240 mg. this morning he continues to have chest discomfort. His blood pressure has dropped to 75/55. This may be related to his morning medications. EKG is similar to the past, but there is suggestion of increased ST elevation.  OBJECTIVE Filed Vitals:   02/06/16 0740 02/06/16 0830 02/06/16 0837 02/06/16 0849  BP: 113/86 88/64 85/65  74/58  Pulse: 96 86 84 81  Temp:      TempSrc:      Resp:  29 17 34  Height:      Weight:      SpO2:  99% 99% 98%    Intake/Output Summary (Last 24 hours) at 02/06/16 0916 Last data filed at 02/06/16 1610  Gross per 24 hour  Intake 882.03 ml  Output    600 ml  Net 282.03 ml   Filed Weights   02/06/16 0523  Weight: 161 lb 9.6 oz (73.301 kg)    PHYSICAL EXAM General: Well developed, well nourished, male in no acute distress. Head: Normocephalic, atraumatic.  Neck: Supple without bruits, JVD not elevated. Lungs:  Resp regular and unlabored, few rales, generally clear. Heart: RRR, S1, S2, no S3, S4, soft murmur; possible rub at the apex Abdomen: Soft, non-tender,  non-distended, BS + x 4.  Extremities: No clubbing, cyanosis, edema.  Neuro: Sleepy but rousable and oriented X 3. Moves all extremities spontaneously. Psych: Normal affect.  LABS: CBC:  Recent Labs  02/06/16 0046 02/06/16 0645  WBC 11.6* 11.7*  NEUTROABS 8.9*  --   HGB 13.2 13.2  HCT 40.7 39.8  MCV 87.2 87.9  PLT 184 179   INR:  Recent Labs  02/06/16 0046  INR 1.43   Basic Metabolic Panel:  Recent Labs  96/04/54 0046 02/06/16 0601  NA 138 138  K 4.1 4.9  CL 105 103  CO2 22 21*  GLUCOSE 341* 353*  BUN 13 12  CREATININE 1.23 1.20  CALCIUM 8.8* 9.1  MG  --  1.7   Cardiac Enzymes:  Recent Labs  02/06/16 0601  TROPONINI 0.11*    Recent Labs  02/06/16 0118  TROPIPOC 0.16*   BNP:  B NATRIURETIC PEPTIDE  Date/Time Value Ref Range Status  02/06/2016 12:46 AM 1224.6* 0.0 - 100.0 pg/mL Final  01/26/2016 06:48 PM 1378.3* 0.0 - 100.0 pg/mL Final   Fasting Lipid Panel:  Recent Labs  02/06/16 0601  CHOL 169  HDL 58  LDLCALC 91  TRIG 102  CHOLHDL 2.9   Drugs of Abuse     Component Value Date/Time   LABOPIA POSITIVE* 02/06/2016 0244   COCAINSCRNUR POSITIVE* 02/06/2016 0244   LABBENZ NONE  DETECTED 02/06/2016 0244   AMPHETMU NONE DETECTED 02/06/2016 0244   THCU NONE DETECTED 02/06/2016 0244   LABBARB NONE DETECTED 02/06/2016 0244    TELE: SR, ST    ECG: 04/14 SR, J point elevation in V2-V3  Radiology/Studies: Dg Chest Portable 1 View 02/06/2016  CLINICAL DATA:  Chest pain, onset tonight after coughing. Hemoptysis. EXAM: PORTABLE CHEST 1 VIEW COMPARISON:  01/27/2016 FINDINGS: Mild patchy opacities in the bases. No large effusions. Unchanged mild cardiomegaly. No pneumothorax. IMPRESSION: Patchy basilar opacities bilaterally may represent alveolar edema, infectious infiltrate, atelectasis. Electronically Signed   By: Ellery Plunk M.D.   On: 02/06/2016 03:11     Current Medications:  . atorvastatin  80 mg Oral QPM  . carvedilol  12.5 mg  Oral BID WC  . clopidogrel  75 mg Oral Q breakfast  . digoxin  0.125 mg Oral Daily  . diltiazem  240 mg Oral Daily  . furosemide  80 mg Oral BID  . hydrALAZINE  25 mg Oral TID  . insulin aspart  2-10 Units Subcutaneous TID WC  . insulin glargine  20 Units Subcutaneous Q2200  . isosorbide mononitrate  30 mg Oral Daily  . lisinopril  20 mg Oral Daily  . nitroGLYCERIN  1 inch Topical 4 times per day  . pantoprazole  40 mg Oral Daily  . piperacillin-tazobactam (ZOSYN)  IV  3.375 g Intravenous 3 times per day  . potassium chloride SA  20 mEq Oral BID  . rivaroxaban  20 mg Oral Q supper  . sodium chloride  250 mL Intravenous Once  . spironolactone  25 mg Oral BID  . vancomycin  1,000 mg Intravenous Q12H   . amiodarone 30 mg/hr (02/06/16 0744)  . norepinephrine (LEVOPHED) Adult infusion      ASSESSMENT AND PLAN:     Hypotension    The patient's nitroglycerin was stopped and IV fluids were started. His hypotension may be related to his morning medications. However he is complaining of significant chest pain. It is very difficult to know if he's having an acute cardiac event or not. There may be more ST elevation on his EKG. Considering all factors. We have decided for him to go to the cardiac Cath Lab for further diagnosis and treatment.    NSTEMI (non-ST elevated myocardial infarction) (HCC)     There is troponin elevation. So far does not marked. His EKG today suggests some worsening of his ST elevation across the precordium. Considering all factors, he will go to the cath lab for further diagnosis and treatment.    Acute on chronic combined systolic and diastolic congestive heart failure (HCC)    There is question on his x-ray that he could be wet. However he is lying flat in bed and hypotensive at this time. We are giving him IV fluids.    Cocaine abuse     His cocaine may well be playing a significant role in his overall status.    Type 2 diabetes mellitus with hyperglycemia  (HCC)    Atrial flutter (HCC)    He had atrial flutter on admission. He converted. It is possible that troponin elevation could be related to this.   Signed, Leanna Battles 9:16 AM 02/06/2016  Patient seen and examined. I agree with the assessment and plan as detailed above. See also my additional thoughts below.   I have assessed the patient with Theodore Demark PA-C. I have personally written the assessment and plan above. Etiology of the current  hypotension is not clear. It may all be related to the addition of his medications. However he has ongoing chest pain. He has troponin elevation although this could be related to his atrial flutter. His EKG suggests some increase in his anterior ST elevation. He will be going to the Cath Lab for further assessment and treatment.  Willa Rough, MD, [facility] 02/06/2016 9:29 AM

## 2016-02-06 NOTE — Interval H&P Note (Signed)
History and Physical Interval Note:  02/06/2016 9:34 AM  Sean Hinton  has presented today for surgery, with the diagnosis of nstemi, chf - recent Aflutter with RVR. The various methods of treatment have been discussed with the patient and family. After consideration of risks, benefits and other options for treatment, the patient has consented to  Procedure(s): Left Heart Cath and Coronary Angiography (N/A) with Possible Percutaneous Coronary Intervention as a surgical intervention .  The patient's history has been reviewed, patient examined, no change in status, stable for surgery.  I have reviewed the patient's chart and labs.  Questions were answered to the patient's satisfaction.    Cath Lab Visit (complete for each Cath Lab visit)  Clinical Evaluation Leading to the Procedure:   ACS: Yes.    Non-ACS:    Anginal Classification: CCS IV  Anti-ischemic medical therapy: Maximal Therapy (2 or more classes of medications)  Non-Invasive Test Results: No non-invasive testing performed  Prior CABG: No previous CABG   HARDING, DAVID W

## 2016-02-06 NOTE — ED Notes (Signed)
Attempted to reassess patient's pain, but patient is too sleepy at this time. Patient woke to touch and voice and stated "I still have chest pain", but quickly fell back asleep thus preventing quantification of pain. Patient sleeping soundly. MD Zachery Conch informed and advised this RN to apply NTG paste and hold Dilaudid at this time.

## 2016-02-06 NOTE — Care Management Note (Signed)
Case Management Note  Patient Details  Name: Sean Hinton MRN: [id number] Date of Birth: Aug 14, 1961  Subjective/Objective:                    Action/Plan: Patient was admitted for a NSTEMI.  He is currently listed as self-pay and is an active patient at the [facility] as well as the transitional care clinic there.  He has his medications filled through the [facility] pharmacy. Prior to admission, patient was on Xarelto. Will follow for discharge needs.   Expected Discharge Date:                  Expected Discharge Plan:     In-House Referral:     Discharge planning Services     Post Acute Care Choice:    Choice offered to:     DME Arranged:    DME Agency:     HH Arranged:    HH Agency:     Status of Service:  In process, will continue to follow  Medicare Important Message Given:    Date Medicare IM Given:    Medicare IM give by:    Date Additional Medicare IM Given:    Additional Medicare Important Message give by:     If discussed at Long Length of Stay Meetings, dates discussed:    Additional Comments:  Anda Kraft, RN 02/06/2016, 3:00 PM [phone]

## 2016-02-06 NOTE — Progress Notes (Signed)
Called by NP to assist with patient with urgent cath lab need and hypotension.  On arrival patient supine in bed - arouses to name - follows commands  - states pain in chest still there 8/10 - calm affect - skin cool to touch - BP 68/54 HR 80 SR - on 2 liter nasal cannula - denies SOB or nausea - stat call to pharmacy per order for Levophed drip - has 2 patent IV's - Theodore Demark NP at bedside - cath team here - prepared for transport - Levophed drip arrives as we are leaving the unit - primed and started at 103mcg/min during transport per order NP - patient tol transfer - handoff to cath team - questions answered - patient remains easily arousable.

## 2016-02-06 NOTE — ED Notes (Signed)
MD Zachery Conch informed of patient's continued chest pain. At this time MD advises to administer Dilaudid and reassess. At this time patient lethargic from Ativan administration.

## 2016-02-06 NOTE — ED Notes (Signed)
Patient arrives via EMS tonight from home with complaint of chest tightness which began tonight after coughing. Endorses hemoptysis with coughing fit. History of MI and reported to EMS that pain is similar. Also CHF history, patient self medicated with lasix 80mg  PTA because he suspected his fluid burden may be high. NTG SL x3 and ASA 324mg  given PTA by EMS.

## 2016-02-06 NOTE — Progress Notes (Signed)
Pharmacy Antibiotic Note  Sean Hinton is a 55 y.o. male admitted on 02/06/2016 with pneumonia.  Pharmacy has been consulted for Vancomycin/Zosyn dosing. Recent DC on 4/6. WBC 11.6. Renal function ok. Other labs reviewed.   Plan: -Vancomycin 1000 mg IV q12h -Zosyn 3.375G IV q8h to be infused over 4 hours -Trend WBC, temp, renal function  -Drug levels as indicated   Height: 5\' 9"  (175.3 cm) Weight: 161 lb 9.6 oz (73.301 kg) IBW/kg (Calculated) : 70.7  Temp (24hrs), Avg:100 F (37.8 C), Min:99.5 F (37.5 C), Max:100.5 F (38.1 C)   Recent Labs Lab 02/06/16 0046  WBC 11.6*  CREATININE 1.23    Estimated Creatinine Clearance: 70.3 mL/min (by C-G formula based on Cr of 1.23).    No Known Allergies  02/08/16 02/06/2016 6:05 AM

## 2016-02-06 NOTE — ED Provider Notes (Signed)
CSN: [id number]     Arrival date & time 02/06/16  0044 History  By signing my name below, I, Arianna Nassar, attest that this documentation has been prepared under the direction and in the presence of Tomasita Crumble, MD. Electronically Signed: Octavia Heir, ED Scribe. 02/06/2016. 12:59 AM.    Chief Complaint  Patient presents with  . Code STEMI  . Chest Pain     The history is provided by the patient and the EMS personnel. No language interpreter was used.   HPI Comments: Sean Hinton is a 55 y.o. male brought in by ambulance, who has a PMHx of DM, HTN, A-fib, Ischemic cardiomyopathy, CAD, and HLD presents to the Emergency Department complaining of sudden onset, gradual worsening, moderate, chest pain onset about several hours ago. Pt states he was sitting down watching television when he began to feel sharp, sudden chest pain with associated shortness of breath. He reports earlier in the day he was coughing up blood. Pt has not had any food since 4:30pm but drank a lot of juice at 10:30pm. He says he has had this happen to him before and had a stent placed in January. Pt is currently taking Xarelto, Plavix and Lasix. He reports being taken off of Metoprolol several months ago. Pt received nitro and 4 baby aspirin via EMS with minimal relief. Denies fever, recent travel, and hx of blood clots.  Past Medical History  Diagnosis Date  . Diabetes mellitus with complication (HCC)   . Hypertension   . Syncope     a. 02/2015 - felt 2/2 rapid AF/AFL.  Marland Kitchen Atrial fibrillation and flutter (HCC)     a. Dx 02/2015 - placed on Xarelto; b. 09/2015 recurrence in setting of PNA.  . Ischemic cardiomyopathy     a. 09/2015 Echo: EF 35-40%.  Marland Kitchen CAD (coronary artery disease)     a. Dx 02/2015 - 2V CAD with CTO LAD, diffuse dz in PDA, OM - med rx.  . Hypertensive heart disease   . Hyperlipidemia   . Mitral regurgitation     a. Mod by echo 02/2015,  . Tobacco abuse   . Chronic systolic CHF (congestive heart  failure) (HCC)     a. 09/2015 Echo: EF 35-40%, sev apical/antlat, apical HK, mild MR, mildly dil LA.  Marland Kitchen ARF (acute respiratory failure) (HCC)   . Pneumonia 09/2015  . GERD (gastroesophageal reflux disease)   . Headache   . Arthritis     ra  . NSTEMI (non-ST elevated myocardial infarction) (HCC) 10/2015   Past Surgical History  Procedure Laterality Date  . Right thumb surgery     . Cardiac catheterization N/A 02/24/2015    Procedure: Left Heart Cath and Coronary Angiography;  Surgeon: Marykay Lex, MD;  Location: [facility] INVASIVE CV LAB CUPID;  Service: Cardiovascular;  Laterality: N/A;  . Cardiac catheterization  02/24/2015    Procedure: Coronary/Bypass Graft CTO Intervention;  Surgeon: Marykay Lex, MD;  Location: [facility] INVASIVE CV LAB CUPID;  Service: Cardiovascular;;  . Cardiac catheterization N/A 11/03/2015    Procedure: Left Heart Cath and Coronary Angiography;  Surgeon: Tonny Bollman, MD;  Location: [facility] INVASIVE CV LAB;  Service: Cardiovascular;  Laterality: N/A;  . Cardiac catheterization N/A 11/03/2015    Procedure: Coronary Stent Intervention;  Surgeon: Tonny Bollman, MD;  Location: [facility] INVASIVE CV LAB;  Service: Cardiovascular;  Laterality: N/A;  RAMUS   Family History  Problem Relation Age of Onset  . Diabetes Mother   . Hypertension Mother   .  Heart disease Mother   . Cancer Mother     stomach cancer   . Diabetes Father   . Hypertension Father   . Heart disease Father   . Diabetes Sister   . Hypertension Sister   . Cancer Sister     breast cancer   . Diabetes Brother   . Hypertension Brother   . Heart disease Brother   . Stroke Brother   . Heart attack Mother   . Heart attack Father   . Heart attack Brother    Social History  Substance Use Topics  . Smoking status: Current Some Day Smoker -- 0.10 packs/day for 30 years    Types: Cigarettes  . Smokeless tobacco: Never Used  . Alcohol Use: No    Review of Systems  A complete 10 system review of systems was  obtained and all systems are negative except as noted in the HPI and PMH.    Allergies  Review of patient's allergies indicates no known allergies.  Home Medications   Prior to Admission medications   Medication Sig Start Date End Date Taking? Authorizing Provider  albuterol (PROVENTIL HFA;VENTOLIN HFA) 108 (90 Base) MCG/ACT inhaler Inhale 2 puffs into the lungs every 6 (six) hours as needed for wheezing or shortness of breath. 12/05/15   Jaclyn Shaggy, MD  amiodarone (PACERONE) 200 MG tablet Take 1 tablet (200 mg total) by mouth daily. 12/01/15   Maryann Mikhail, DO  amLODipine (NORVASC) 5 MG tablet Take 1 tablet (5 mg total) by mouth daily. 12/01/15   Rolly Salter, MD  atorvastatin (LIPITOR) 80 MG tablet Take 1 tablet (80 mg total) by mouth every evening. 10/17/15   Ripudeep Jenna Luo, MD  Blood Glucose Monitoring Suppl (TRUE METRIX METER) DEVI 1 Device by Does not apply route 4 (four) times daily -  before meals and at bedtime. 01/30/16   Jaclyn Shaggy, MD  carvedilol (COREG) 12.5 MG tablet Take 12.5 mg by mouth 2 (two) times daily with a meal.    Historical Provider, MD  clopidogrel (PLAVIX) 75 MG tablet Take 1 tablet (75 mg total) by mouth daily with breakfast. 01/30/16   Jaclyn Shaggy, MD  Dexlansoprazole (DEXILANT) 30 MG capsule Take 1 capsule (30 mg total) by mouth daily. 12/30/14   Josalyn Funches, MD  digoxin (LANOXIN) 0.125 MG tablet Take 0.125 mg by mouth daily.    Historical Provider, MD  diltiazem (DILACOR XR) 240 MG 24 hr capsule Take 240 mg by mouth daily.    Historical Provider, MD  Elastic Bandages & Supports (MEDICAL COMPRESSION STOCKINGS) MISC 20 mm Hg pressure 01/05/16   Josalyn Funches, MD  furosemide (LASIX) 40 MG tablet Take 2 tablets (80 mg total) by mouth 2 (two) times daily. 01/29/16   Azalee Course, PA  glucose blood (TRUE METRIX BLOOD GLUCOSE TEST) test strip Use as instructed 01/30/16   Jaclyn Shaggy, MD  hydrALAZINE (APRESOLINE) 25 MG tablet Take 1 tablet (25 mg total) by mouth 3 (three)  times daily. 01/29/16   Azalee Course, PA  insulin aspart (NOVOLOG FLEXPEN) 100 UNIT/ML FlexPen Before each meal 3 times a day, 140-199 - 2 units, 200-250 - 4 units, 251-299 - 6 units,  300-349 - 8 units,  350 or above 10 units. Insulin PEN if approved, provide syringes and needles if needed. 01/29/16   Albertine Grates, MD  Insulin Glargine (LANTUS SOLOSTAR) 100 UNIT/ML Solostar Pen Inject 20 Units into the skin daily at 10 pm. 01/29/16   Albertine Grates, MD  ipratropium-albuterol (  DUONEB) 0.5-2.5 (3) MG/3ML SOLN Take 3 mLs by nebulization 2 (two) times daily. 10/17/15   Ripudeep Jenna Luo, MD  isosorbide mononitrate (IMDUR) 30 MG 24 hr tablet Take 1 tablet (30 mg total) by mouth daily. 11/11/15   Jaclyn Shaggy, MD  lisinopril (PRINIVIL,ZESTRIL) 20 MG tablet Take 20 mg by mouth daily.    Historical Provider, MD  nitroGLYCERIN (NITROSTAT) 0.4 MG SL tablet Place 1 tablet (0.4 mg total) under the tongue every 5 (five) minutes as needed for chest pain (up to 3 doses). 02/25/15   Dayna N Dunn, PA-C  potassium chloride SA (K-DUR,KLOR-CON) 20 MEQ tablet Take 1 tablet (20 mEq total) by mouth 2 (two) times daily. 01/29/16   Azalee Course, PA  rivaroxaban (XARELTO) 20 MG TABS tablet Take 1 tablet (20 mg total) by mouth daily with supper. Patient taking differently: Take 20 mg by mouth daily.  02/25/15   Dayna N Dunn, PA-C  spironolactone (ALDACTONE) 25 MG tablet Take 1 tablet (25 mg total) by mouth 2 (two) times daily. 01/05/16   Josalyn Funches, MD  traMADol (ULTRAM) 50 MG tablet Take 1 tablet (50 mg total) by mouth every 6 (six) hours as needed for severe pain. 11/25/15   Jaclyn Shaggy, MD  TRUEPLUS LANCETS 28G MISC 1 each by Does not apply route 4 (four) times daily -  before meals and at bedtime. 01/30/16   Jaclyn Shaggy, MD   Triage vitals: Pulse 133  Temp(Src) 99.5 F (37.5 C) (Oral)  Resp 20  SpO2 96% Physical Exam  Constitutional: He is oriented to person, place, and time. Vital signs are normal. He appears well-developed and well-nourished.   Non-toxic appearance. He does not appear ill. He appears distressed.  HENT:  Head: Normocephalic and atraumatic.  Nose: Nose normal.  Mouth/Throat: Oropharynx is clear and moist. No oropharyngeal exudate.  Eyes: Conjunctivae and EOM are normal. Pupils are equal, round, and reactive to light. No scleral icterus.  Neck: Normal range of motion. Neck supple. No tracheal deviation, no edema, no erythema and normal range of motion present. No thyroid mass and no thyromegaly present.  Cardiovascular: Regular rhythm, S1 normal, S2 normal, normal heart sounds, intact distal pulses and normal pulses.  Tachycardia present.  Exam reveals no gallop and no friction rub.   No murmur heard. Pulmonary/Chest: Effort normal and breath sounds normal. No respiratory distress. He has no wheezes. He has no rhonchi. He has no rales.  Abdominal: Soft. Normal appearance and bowel sounds are normal. He exhibits no distension, no ascites and no mass. There is no hepatosplenomegaly. There is no tenderness. There is no rebound, no guarding and no CVA tenderness.  Musculoskeletal: Normal range of motion. He exhibits no edema or tenderness.  Lymphadenopathy:    He has no cervical adenopathy.  Neurological: He is alert and oriented to person, place, and time. He has normal strength. No cranial nerve deficit or sensory deficit.  Skin: Skin is warm, dry and intact. No petechiae and no rash noted. He is not diaphoretic. No erythema. No pallor.  Psychiatric: He has a normal mood and affect. His behavior is normal. Judgment normal.  Nursing note and vitals reviewed.   ED Course  Procedures  DIAGNOSTIC STUDIES: Oxygen Saturation is 96% on RA, normal by my interpretation.  COORDINATION OF CARE:  12:54 AM Will order DG chest and lab work. Discussed treatment plan with pt at bedside and pt agreed to plan.  Labs Review Labs Reviewed  CBC WITH DIFFERENTIAL/PLATELET - Abnormal; Notable for  the following:    WBC 11.6 (*)     Neutro Abs 8.9 (*)    All other components within normal limits  BRAIN NATRIURETIC PEPTIDE - Abnormal; Notable for the following:    B Natriuretic Peptide 1224.6 (*)    All other components within normal limits  BASIC METABOLIC PANEL - Abnormal; Notable for the following:    Glucose, Bld 341 (*)    Calcium 8.8 (*)    All other components within normal limits  PROTIME-INR - Abnormal; Notable for the following:    Prothrombin Time 17.6 (*)    All other components within normal limits  URINE RAPID DRUG SCREEN, HOSP PERFORMED - Abnormal; Notable for the following:    Opiates POSITIVE (*)    Cocaine POSITIVE (*)    All other components within normal limits  TROPONIN I - Abnormal; Notable for the following:    Troponin I 0.11 (*)    All other components within normal limits  BASIC METABOLIC PANEL - Abnormal; Notable for the following:    CO2 21 (*)    Glucose, Bld 353 (*)    All other components within normal limits  I-STAT TROPOININ, ED - Abnormal; Notable for the following:    Troponin i, poc 0.16 (*)    All other components within normal limits  MRSA PCR SCREENING  CULTURE, BLOOD (ROUTINE X 2)  CULTURE, BLOOD (ROUTINE X 2)  CULTURE, EXPECTORATED SPUTUM-ASSESSMENT  LIPID PANEL  MAGNESIUM  TROPONIN I  TROPONIN I  CBC    Imaging Review Dg Chest Portable 1 View  02/06/2016  CLINICAL DATA:  Chest pain, onset tonight after coughing. Hemoptysis. EXAM: PORTABLE CHEST 1 VIEW COMPARISON:  01/27/2016 FINDINGS: Mild patchy opacities in the bases. No large effusions. Unchanged mild cardiomegaly. No pneumothorax. IMPRESSION: Patchy basilar opacities bilaterally may represent alveolar edema, infectious infiltrate, atelectasis. Electronically Signed   By: Ellery Plunk M.D.   On: 02/06/2016 03:11   I have personally reviewed and evaluated these images and lab results as part of my medical decision-making.   EKG Interpretation   Date/Time:  Friday February 06 2016 02:40:25 EDT Ventricular  Rate:  100 PR Interval:  148 QRS Duration: 75 QT Interval:  331 QTC Calculation: 427 R Axis:   153 Text Interpretation:  Sinus tachycardia Biatrial enlargement Anterior  infarct, old flutter has now resolved Confirmed by Erroll Luna  [phone]) on 02/06/2016 2:45:20 AM      MDM   Final diagnoses:  None     Patient presents to the ED for chest pain.  EKF reveals atrial flutter.  Initially called a code STEMI, this was cancelled by cardiology.  Patient is on amio and was given 150mg  bolus for treatment.  He was recently taken off his metoprolol 1 month ago.     After amiodarone bolus, patient continues to be tachycardic to the 150s. I placed patient on amiodarone drip. Urine toxicology reveals cocaine metabolites. He has a history of using this and going into atrial flutter as well. He was given Ativan for cocaine-induced chest pain. He was admitted to cardiology for further care.  Patient is currently in sinus rhythm but remains tachycardic.  Troponin is elevated at 0.16, cardiology is aware. Likely demand ischemia in setting of atrial flutter and cocaine use.    CRITICAL CARE Performed by:   Total critical care time: 40 minutes - cardioversion with amiodarone  Critical care time was exclusive of separately billable procedures and treating other patients.  Critical care  was necessary to treat or prevent imminent or life-threatening deterioration.  Critical care was time spent personally by me on the following activities: development of treatment plan with patient and/or surrogate as well as nursing, discussions with consultants, evaluation of patient's response to treatment, examination of patient, obtaining history from patient or surrogate, ordering and performing treatments and interventions, ordering and review of laboratory studies, ordering and review of radiographic studies, pulse oximetry and re-evaluation of patient's condition.    I personally  performed the services described in this documentation, which was scribed in my presence. The recorded information has been reviewed and is accurate.     Tomasita Crumble, MD 02/06/16 [phone]

## 2016-02-06 NOTE — Progress Notes (Signed)
Called to the beside post cath for patient with chest pain. Repeat EKG shows subtle anterolateral ST elevation at J point - at cath, found to have stable CAD without acute lesions. Says pain is better, but 8/10 - then drifts off to sleep. When awakened, complains of chest pain. On levophed for hypotension - EF is around 35-40%. He was given lasix and had high bladder content, now with foley in place. MAP of 80 by femoral sheath. I agree with low dose nitrate for chest pain / CHF. Try to wean down levophed. Does not need art line as cuff pressure is correlating at this time. Avoid narcotics due to ongoing cocaine use.  Chrystie Nose, MD, [facility] Attending Cardiologist [facility]

## 2016-02-06 NOTE — ED Notes (Signed)
Rhythm change, EKG performed and handed to Dr. Mora Bellman

## 2016-02-06 NOTE — H&P (Addendum)
History & Physical    Patient ID: Sean Hinton MRN: [id number], DOB/AGE: 1960/12/16   Admit date: 02/06/2016   Primary Physician: Lora Paula, MD Primary Cardiologist: Dr Royann Shivers   Patient Profile    36M with ICM w/ EF 35-40% 09/2015 echo, PAF on Xarelto, HTN, HLD, DM, cocaine hx, tobacco use, CAD w/ CTO LAD, DES RI 10/2015 who presents with CP, rapid AFL, and NSTEMI.  Past Medical History    Past Medical History  Diagnosis Date  . Diabetes mellitus with complication (HCC)   . Hypertension   . Syncope     a. 02/2015 - felt 2/2 rapid AF/AFL.  Marland Kitchen Atrial fibrillation and flutter (HCC)     a. Dx 02/2015 - placed on Xarelto; b. 09/2015 recurrence in setting of PNA.  . Ischemic cardiomyopathy     a. 09/2015 Echo: EF 35-40%.  Marland Kitchen CAD (coronary artery disease)     a. Dx 02/2015 - 2V CAD with CTO LAD, diffuse dz in PDA, OM - med rx.  . Hypertensive heart disease   . Hyperlipidemia   . Mitral regurgitation     a. Mod by echo 02/2015,  . Tobacco abuse   . Chronic systolic CHF (congestive heart failure) (HCC)     a. 09/2015 Echo: EF 35-40%, sev apical/antlat, apical HK, mild MR, mildly dil LA.  Marland Kitchen ARF (acute respiratory failure) (HCC)   . Pneumonia 09/2015  . GERD (gastroesophageal reflux disease)   . Headache   . Arthritis     ra  . NSTEMI (non-ST elevated myocardial infarction) (HCC) 10/2015    Past Surgical History  Procedure Laterality Date  . Right thumb surgery     . Cardiac catheterization N/A 02/24/2015    Procedure: Left Heart Cath and Coronary Angiography;  Surgeon: Marykay Lex, MD;  Location: [facility] INVASIVE CV LAB CUPID;  Service: Cardiovascular;  Laterality: N/A;  . Cardiac catheterization  02/24/2015    Procedure: Coronary/Bypass Graft CTO Intervention;  Surgeon: Marykay Lex, MD;  Location: [facility] INVASIVE CV LAB CUPID;  Service: Cardiovascular;;  . Cardiac catheterization N/A 11/03/2015    Procedure: Left Heart Cath and Coronary Angiography;  Surgeon: Tonny Bollman, MD;  Location: [facility] INVASIVE CV LAB;  Service: Cardiovascular;  Laterality: N/A;  . Cardiac catheterization N/A 11/03/2015    Procedure: Coronary Stent Intervention;  Surgeon: Tonny Bollman, MD;  Location: [facility] INVASIVE CV LAB;  Service: Cardiovascular;  Laterality: N/A;  RAMUS     Allergies  No Known Allergies  History of Present Illness    Sean Hinton is a 55 y.o. male with a history of ICM w/ EF 35-40% 09/2015 echo, PAF on Xarelto, HTN, HLD, DM, cocaine hx, tobacco use, CAD w/ CTO LAD, DES RI 10/2015 who presents with CP and rapid AFL. He states around 7-8pm today he develop abrupt onset CP similar to his prior MI. + SOB.  Earlier that day, he noted some blood in his sputum. Prior to this, he felt well. He denied weight gain or edema, but noted he was recently admitted for a CHF exacerbation and required IV diuresis. He denied using cocaine.  Initially, EMS was concerned for STEMI and called a CODE STEMI in the field. On arrival to the ER, he was tacyhcardic to 150 but otherwise hemodynamically stable. ECG demonstrated AFL with 2:1 block and ventricular rate of 153bpm. Probable old ASMI. Labs were notable for K 4.1, Cr 1.23, BNP 1224, POC TnI 0.16, WBC 11.6. He was started on IV amiodarone  and converted. Follow-up ECG demonstrated NSR, biatrial enlargement, probable old anterior infarct with borderline STE V3/V4, somewhat more pronounced than prior. Utox resulted positive for cocaine. CXR demonstrated Patchy basilar opacities bilaterally may represent alveolar edema, infectious infiltrate, atelectasis.  After arrival to the floor, he became tachypneic to 40 and spiked a low grade fever to 100.5. On my exam, he repots that his CP is worse with cough and palpitation.    Home Medications    Prior to Admission medications   Medication Sig Start Date End Date Taking? Authorizing Provider  albuterol (PROVENTIL HFA;VENTOLIN HFA) 108 (90 Base) MCG/ACT inhaler Inhale 2 puffs into the lungs  every 6 (six) hours as needed for wheezing or shortness of breath. 12/05/15  Yes Jaclyn Shaggy, MD  amiodarone (PACERONE) 200 MG tablet Take 1 tablet (200 mg total) by mouth daily. 12/01/15  Yes Maryann Mikhail, DO  amLODipine (NORVASC) 5 MG tablet Take 1 tablet (5 mg total) by mouth daily. 12/01/15  Yes Rolly Salter, MD  atorvastatin (LIPITOR) 80 MG tablet Take 1 tablet (80 mg total) by mouth every evening. 10/17/15  Yes Ripudeep Jenna Luo, MD  Blood Glucose Monitoring Suppl (TRUE METRIX METER) DEVI 1 Device by Does not apply route 4 (four) times daily -  before meals and at bedtime. 01/30/16  Yes Jaclyn Shaggy, MD  carvedilol (COREG) 12.5 MG tablet Take 12.5 mg by mouth 2 (two) times daily with a meal.   Yes Historical Provider, MD  clopidogrel (PLAVIX) 75 MG tablet Take 1 tablet (75 mg total) by mouth daily with breakfast. 01/30/16  Yes Jaclyn Shaggy, MD  Dexlansoprazole (DEXILANT) 30 MG capsule Take 1 capsule (30 mg total) by mouth daily. 12/30/14  Yes Josalyn Funches, MD  digoxin (LANOXIN) 0.125 MG tablet Take 0.125 mg by mouth daily.   Yes Historical Provider, MD  diltiazem (DILACOR XR) 240 MG 24 hr capsule Take 240 mg by mouth daily.   Yes Historical Provider, MD  Elastic Bandages & Supports (MEDICAL COMPRESSION STOCKINGS) MISC 20 mm Hg pressure 01/05/16  Yes Josalyn Funches, MD  furosemide (LASIX) 40 MG tablet Take 2 tablets (80 mg total) by mouth 2 (two) times daily. 01/29/16  Yes Azalee Course, PA  glucose blood (TRUE METRIX BLOOD GLUCOSE TEST) test strip Use as instructed 01/30/16  Yes Jaclyn Shaggy, MD  hydrALAZINE (APRESOLINE) 25 MG tablet Take 1 tablet (25 mg total) by mouth 3 (three) times daily. 01/29/16  Yes Azalee Course, PA  Insulin Glargine (LANTUS SOLOSTAR) 100 UNIT/ML Solostar Pen Inject 20 Units into the skin daily at 10 pm. 01/29/16  Yes Albertine Grates, MD  ipratropium-albuterol (DUONEB) 0.5-2.5 (3) MG/3ML SOLN Take 3 mLs by nebulization 2 (two) times daily. 10/17/15  Yes Ripudeep Jenna Luo, MD  lisinopril  (PRINIVIL,ZESTRIL) 20 MG tablet Take 20 mg by mouth daily.   Yes Historical Provider, MD  nitroGLYCERIN (NITROSTAT) 0.4 MG SL tablet Place 1 tablet (0.4 mg total) under the tongue every 5 (five) minutes as needed for chest pain (up to 3 doses). 02/25/15  Yes Dayna N Dunn, PA-C  potassium chloride SA (K-DUR,KLOR-CON) 20 MEQ tablet Take 1 tablet (20 mEq total) by mouth 2 (two) times daily. 01/29/16  Yes Azalee Course, PA  rivaroxaban (XARELTO) 20 MG TABS tablet Take 1 tablet (20 mg total) by mouth daily with supper. 02/25/15  Yes Dayna N Dunn, PA-C  spironolactone (ALDACTONE) 25 MG tablet Take 1 tablet (25 mg total) by mouth 2 (two) times daily. 01/05/16  Yes Dessa Phi, MD  TRUEPLUS LANCETS 28G MISC 1 each by Does not apply route 4 (four) times daily -  before meals and at bedtime. 01/30/16  Yes Jaclyn Shaggy, MD  insulin aspart (NOVOLOG FLEXPEN) 100 UNIT/ML FlexPen Before each meal 3 times a day, 140-199 - 2 units, 200-250 - 4 units, 251-299 - 6 units,  300-349 - 8 units,  350 or above 10 units. Insulin PEN if approved, provide syringes and needles if needed. Patient not taking: Reported on 02/06/2016 01/29/16   Albertine Grates, MD  isosorbide mononitrate (IMDUR) 30 MG 24 hr tablet Take 1 tablet (30 mg total) by mouth daily. 11/11/15   Jaclyn Shaggy, MD    Family History    Family History  Problem Relation Age of Onset  . Diabetes Mother   . Hypertension Mother   . Heart disease Mother   . Cancer Mother     stomach cancer   . Diabetes Father   . Hypertension Father   . Heart disease Father   . Diabetes Sister   . Hypertension Sister   . Cancer Sister     breast cancer   . Diabetes Brother   . Hypertension Brother   . Heart disease Brother   . Stroke Brother   . Heart attack Mother   . Heart attack Father   . Heart attack Brother     Social History    Social History   Social History  . Marital Status: Single    Spouse Name: N/A  . Number of Children: N/A  . Years of Education: N/A    Occupational History  . Not on file.   Social History Main Topics  . Smoking status: Current Some Day Smoker -- 0.10 packs/day for 30 years    Types: Cigarettes  . Smokeless tobacco: Never Used  . Alcohol Use: No  . Drug Use: Yes    Special: Cocaine     Comment: "last use 1991", Positive UDS 10/2015./last cocaine use on 10/19/2015  . Sexual Activity: Not on file   Other Topics Concern  . Not on file   Social History Narrative     Review of Systems    General:  No chills, fever, night sweats or weight changes.  Cardiovascular:  No chest pain, dyspnea on exertion, edema, orthopnea, palpitations, paroxysmal nocturnal dyspnea. Dermatological: No rash, lesions/masses Respiratory: No cough, dyspnea Urologic: No hematuria, dysuria Abdominal:   No nausea, vomiting, diarrhea, bright red blood per rectum, melena, or hematemesis Neurologic:  No visual changes, wkns, changes in mental status. All other systems reviewed and are otherwise negative except as noted above.  Physical Exam    Blood pressure 123/99, pulse 132, temperature 99.5 F (37.5 C), temperature source Oral, resp. rate 36, SpO2 100 %.  General: Pleasant, NAD Psych: Normal affect. Neuro: Alert and oriented X 3. Moves all extremities spontaneously. HEENT: Normal  Neck: Supple without bruits or JVD. Lungs:  Resp regular and unlabored, CTA. Heart: RRR no s3, s4, or murmurs. Abdomen: Soft, non-tender, non-distended, BS + x 4.  Extremities: No clubbing, cyanosis or edema. DP/PT/Radials 2+ and equal bilaterally.  Labs    Troponin [facility] Test)  Recent Labs  02/06/16 0118  TROPIPOC 0.16*   No results for input(s): CKTOTAL, CKMB, TROPONINI in the last 72 hours. Lab Results  Component Value Date   WBC 11.6* 02/06/2016   HGB 13.2 02/06/2016   HCT 40.7 02/06/2016   MCV 87.2 02/06/2016   PLT 184 02/06/2016    Recent Labs Lab  02/06/16 0046  NA 138  K 4.1  CL 105  CO2 22  BUN 13  CREATININE 1.23   CALCIUM 8.8*  GLUCOSE 341*   Lab Results  Component Value Date   CHOL 128 10/13/2015   HDL 35* 10/13/2015   LDLCALC 80 10/13/2015   TRIG 69 11/19/2015   Lab Results  Component Value Date   DDIMER 0.56* 10/13/2015     Radiology Studies    Dg Chest Port 1 View  01-30-16  CLINICAL DATA:  Short of breath EXAM: PORTABLE CHEST 1 VIEW COMPARISON:  12/09/2015 FINDINGS: Upper normal heart size. Focal patchy density in the right mid lung zone persists. Small right pleural effusion has developed. No pneumothorax. Patchy bibasilar airspace opacities have improved. IMPRESSION: Patchy bibasilar airspace opacities have improved. Patchy right midlung opacity persists. Continued followup until complete resolution is recommended. If the abnormality fails to resolve, underlying nodule cannot be excluded. Electronically Signed   By: Jolaine Click M.D.   On: 2016-01-30 13:31    ECG & Cardiac Imaging    02/06/16 @ 00:52: AFL with 2:1 block and ventricular rate of 153bpm. Probable old ASMI.  02/06/16 @:240am: NSR, biatrial enlargement, probable old anterior infarct with borderline STE V3/V4, somewhat more pronounced than prior  Assessment & Plan    48M with ICM w/ EF 35-40% 09/2015 echo, PAF on Xarelto, HTN, HLD, DM, cocaine hx, tobacco use, CAD w/ CTO LAD, DES RI 10/2015 who presents with CP, rapid AFL, and NSTEMI. I suspect that the adrenergic surge from cocaine use (with possible underlying infection) pushed the patient into rapid AFL. In the setting of a CTO LAD lesion, it is not surprising that (presumably) hours of tachycardia led to a positive troponin. However, it is possible that, particularly in the setting of cocaine use, this could be plaque rupture. Will trend troponins.   He has tachypnea, some hemoptypsis, couch, and fever, all c/w PNA.  It is probable that current CP is related to infection/cough  AFL/AF - IV amiodarone drip overnight - dilt 240mg  daily - continue rivaroxaban for now. But  would hold (with plans to start heparin gtt) if Tn trend is suggestive of ACS.  - could be considered for typical AFL ablation  CAD/NSTEMI. Unclear if Type I vs. Type II MI. Will obtain echo and trend trops to determine whether or not cath is needed. He is on plavix/riva and not ASA.  - continue plavix - imdur - coreg - atorva 80 - coreg (OK in setting of cocaine, relative to metoprolol)  - cycle troponins - currently fully anticoagulationed due to riva; would hold (with plans to start heparin gtt) if Tn trend is suggestive of ACS - TTE  Cough/Fever/Hemotypsis, suspect PNA - start vanc/zosyn given recent hospitalization - monitor sputum for blood; may need to hold riva on those grounds - blood/sputum cultures - PRN tylenol  DM - home insulin: glargine 20, aspart sliding scale  HF - lasix 80mg   - spiro 25mg  BID - coreg 12.5mg  BID - dig 0.125mg  daily  Signed, Glori Luis, MD 02/06/2016, 2:37 AM

## 2016-02-06 NOTE — Progress Notes (Signed)
Patient Name: Sean Hinton Date of Encounter: 02/06/2016  Principal Problem:   NSTEMI (non-ST elevated myocardial infarction) [facility]) Active Problems:   Acute on chronic combined systolic and diastolic congestive heart failure (HCC)   Cocaine abuse   Type 2 diabetes mellitus with hyperglycemia (HCC)   Atrial flutter [facility]   Primary Cardiologist: Dr Royann Shivers Patient Profile: 55 y.o. male with a history of ICM w/ EF 35-40% 09/2015 echo, PAF on Xarelto, HTN, HLD, DM, cocaine hx, tobacco use, CAD w/ CTO LAD, DES RI 10/2015. D/c 04/06 after admit for CHF, wt at d/c 168 lbs. Admitted 04/14 w/ chest pain, UDS + cocaine, initial rhythm atrial flutter, spont conversion to SR.  SUBJECTIVE: C/o 9/10 chest pain since admission. Sleepy because of Ativan given earlier. Rx given at approx 07:45 am included lisinopril 20 mg, hydralazine 25 mg, Lasix 80 mg, Coreg 12.5 mg, Cardizem 240 mg. this morning he continues to have chest discomfort. His blood pressure has dropped to 75/55. This may be related to his morning medications. EKG is similar to the past, but there is suggestion of increased ST elevation.  OBJECTIVE Filed Vitals:   02/06/16 0740 02/06/16 0830 02/06/16 0837 02/06/16 0849  BP: 113/86 88/64 85/65  74/58  Pulse: 96 86 84 81  Temp:      TempSrc:      Resp:  29 17 34  Height:      Weight:      SpO2:  99% 99% 98%    Intake/Output Summary (Last 24 hours) at 02/06/16 0916 Last data filed at 02/06/16 1610  Gross per 24 hour  Intake 882.03 ml  Output    600 ml  Net 282.03 ml   Filed Weights   02/06/16 0523  Weight: 161 lb 9.6 oz (73.301 kg)    PHYSICAL EXAM General: Well developed, well nourished, male in no acute distress. Head: Normocephalic, atraumatic.  Neck: Supple without bruits, JVD not elevated. Lungs:  Resp regular and unlabored, few rales, generally clear. Heart: RRR, S1, S2, no S3, S4, soft murmur; possible rub at the apex Abdomen: Soft, non-tender,  non-distended, BS + x 4.  Extremities: No clubbing, cyanosis, edema.  Neuro: Sleepy but rousable and oriented X 3. Moves all extremities spontaneously. Psych: Normal affect.  LABS: CBC:  Recent Labs  02/06/16 0046 02/06/16 0645  WBC 11.6* 11.7*  NEUTROABS 8.9*  --   HGB 13.2 13.2  HCT 40.7 39.8  MCV 87.2 87.9  PLT 184 179   INR:  Recent Labs  02/06/16 0046  INR 1.43   Basic Metabolic Panel:  Recent Labs  96/04/54 0046 02/06/16 0601  NA 138 138  K 4.1 4.9  CL 105 103  CO2 22 21*  GLUCOSE 341* 353*  BUN 13 12  CREATININE 1.23 1.20  CALCIUM 8.8* 9.1  MG  --  1.7   Cardiac Enzymes:  Recent Labs  02/06/16 0601  TROPONINI 0.11*    Recent Labs  02/06/16 0118  TROPIPOC 0.16*   BNP:  B NATRIURETIC PEPTIDE  Date/Time Value Ref Range Status  02/06/2016 12:46 AM 1224.6* 0.0 - 100.0 pg/mL Final  01/26/2016 06:48 PM 1378.3* 0.0 - 100.0 pg/mL Final   Fasting Lipid Panel:  Recent Labs  02/06/16 0601  CHOL 169  HDL 58  LDLCALC 91  TRIG 102  CHOLHDL 2.9   Drugs of Abuse     Component Value Date/Time   LABOPIA POSITIVE* 02/06/2016 0244   COCAINSCRNUR POSITIVE* 02/06/2016 0244   LABBENZ NONE  DETECTED 02/06/2016 0244   AMPHETMU NONE DETECTED 02/06/2016 0244   THCU NONE DETECTED 02/06/2016 0244   LABBARB NONE DETECTED 02/06/2016 0244    TELE: SR, ST    ECG: 04/14 SR, J point elevation in V2-V3  Radiology/Studies: Dg Chest Portable 1 View 02/06/2016  CLINICAL DATA:  Chest pain, onset tonight after coughing. Hemoptysis. EXAM: PORTABLE CHEST 1 VIEW COMPARISON:  01/27/2016 FINDINGS: Mild patchy opacities in the bases. No large effusions. Unchanged mild cardiomegaly. No pneumothorax. IMPRESSION: Patchy basilar opacities bilaterally may represent alveolar edema, infectious infiltrate, atelectasis. Electronically Signed   By: Ellery Plunk M.D.   On: 02/06/2016 03:11     Current Medications:  . atorvastatin  80 mg Oral QPM  . carvedilol  12.5 mg  Oral BID WC  . clopidogrel  75 mg Oral Q breakfast  . digoxin  0.125 mg Oral Daily  . diltiazem  240 mg Oral Daily  . furosemide  80 mg Oral BID  . hydrALAZINE  25 mg Oral TID  . insulin aspart  2-10 Units Subcutaneous TID WC  . insulin glargine  20 Units Subcutaneous Q2200  . isosorbide mononitrate  30 mg Oral Daily  . lisinopril  20 mg Oral Daily  . nitroGLYCERIN  1 inch Topical 4 times per day  . pantoprazole  40 mg Oral Daily  . piperacillin-tazobactam (ZOSYN)  IV  3.375 g Intravenous 3 times per day  . potassium chloride SA  20 mEq Oral BID  . rivaroxaban  20 mg Oral Q supper  . sodium chloride  250 mL Intravenous Once  . spironolactone  25 mg Oral BID  . vancomycin  1,000 mg Intravenous Q12H   . amiodarone 30 mg/hr (02/06/16 0744)  . norepinephrine (LEVOPHED) Adult infusion      ASSESSMENT AND PLAN:     Hypotension    The patient's nitroglycerin was stopped and IV fluids were started. His hypotension may be related to his morning medications. However he is complaining of significant chest pain. It is very difficult to know if he's having an acute cardiac event or not. There may be more ST elevation on his EKG. Considering all factors. We have decided for him to go to the cardiac Cath Lab for further diagnosis and treatment.    NSTEMI (non-ST elevated myocardial infarction) (HCC)     There is troponin elevation. So far does not marked. His EKG today suggests some worsening of his ST elevation across the precordium. Considering all factors, he will go to the cath lab for further diagnosis and treatment.    Acute on chronic combined systolic and diastolic congestive heart failure (HCC)    There is question on his x-ray that he could be wet. However he is lying flat in bed and hypotensive at this time. We are giving him IV fluids.    Cocaine abuse     His cocaine may well be playing a significant role in his overall status.    Type 2 diabetes mellitus with hyperglycemia  (HCC)    Atrial flutter (HCC)    He had atrial flutter on admission. He converted. It is possible that troponin elevation could be related to this.   Signed, Leanna Battles 9:16 AM 02/06/2016  Patient seen and examined. I agree with the assessment and plan as detailed above. See also my additional thoughts below.   I have assessed the patient with Theodore Demark PA-C. I have personally written the assessment and plan above. Etiology of the current  hypotension is not clear. It may all be related to the addition of his medications. However he has ongoing chest pain. He has troponin elevation although this could be related to his atrial flutter. His EKG suggests some increase in his anterior ST elevation. He will be going to the Cath Lab for further assessment and treatment.  Jeffrey Katz, MD, FACC 02/06/2016 9:29 AM    

## 2016-02-07 ENCOUNTER — Inpatient Hospital Stay (HOSPITAL_COMMUNITY): Payer: Medicaid Other

## 2016-02-07 DIAGNOSIS — F1410 Cocaine abuse, uncomplicated: Secondary | ICD-10-CM

## 2016-02-07 DIAGNOSIS — E1165 Type 2 diabetes mellitus with hyperglycemia: Secondary | ICD-10-CM

## 2016-02-07 DIAGNOSIS — I4892 Unspecified atrial flutter: Secondary | ICD-10-CM

## 2016-02-07 DIAGNOSIS — R7989 Other specified abnormal findings of blood chemistry: Secondary | ICD-10-CM

## 2016-02-07 DIAGNOSIS — Z955 Presence of coronary angioplasty implant and graft: Secondary | ICD-10-CM

## 2016-02-07 DIAGNOSIS — I5043 Acute on chronic combined systolic (congestive) and diastolic (congestive) heart failure: Secondary | ICD-10-CM

## 2016-02-07 LAB — GLUCOSE, CAPILLARY
GLUCOSE-CAPILLARY: 209 mg/dL — AB (ref 65–99)
Glucose-Capillary: 241 mg/dL — ABNORMAL HIGH (ref 65–99)
Glucose-Capillary: 263 mg/dL — ABNORMAL HIGH (ref 65–99)
Glucose-Capillary: 152 mg/dL — ABNORMAL HIGH (ref 65–99)

## 2016-02-07 LAB — CBC
HEMATOCRIT: 35.3 % — AB (ref 39.0–52.0)
Hemoglobin: 11.6 g/dL — ABNORMAL LOW (ref 13.0–17.0)
MCH: 28.4 pg (ref 26.0–34.0)
MCHC: 32.9 g/dL (ref 30.0–36.0)
MCV: 86.5 fL (ref 78.0–100.0)
PLATELETS: 127 10*3/uL — AB (ref 150–400)
RBC: 4.08 MIL/uL — AB (ref 4.22–5.81)
RDW: 14.2 % (ref 11.5–15.5)
WBC: 8.9 10*3/uL (ref 4.0–10.5)

## 2016-02-07 LAB — BASIC METABOLIC PANEL
Anion gap: 12 (ref 5–15)
BUN: 20 mg/dL (ref 6–20)
CHLORIDE: 104 mmol/L (ref 101–111)
CO2: 21 mmol/L — AB (ref 22–32)
CREATININE: 1.26 mg/dL — AB (ref 0.61–1.24)
Calcium: 8.2 mg/dL — ABNORMAL LOW (ref 8.9–10.3)
GFR calc non Af Amer: >60 mL/min (ref >60)
Glucose, Bld: 273 mg/dL — ABNORMAL HIGH (ref 65–99)
Potassium: 5 mmol/L (ref 3.5–5.1)
Sodium: 137 mmol/L (ref 135–145)

## 2016-02-07 LAB — SEDIMENTATION RATE: SED RATE: 30 mm/h — AB (ref 0–16)

## 2016-02-07 MED ORDER — INSULIN GLARGINE 100 UNIT/ML ~~LOC~~ SOLN
30.0000 [IU] | Freq: Every day | SUBCUTANEOUS | Status: DC
  Administered 2016-02-07 – 2016-02-09 (×3): 30 [IU] via SUBCUTANEOUS
  Filled 2016-02-07 (×4): qty 0.3

## 2016-02-07 MED ORDER — MUPIROCIN 2 % EX OINT
TOPICAL_OINTMENT | Freq: Two times a day (BID) | CUTANEOUS | Status: DC
  Administered 2016-02-07 – 2016-02-08 (×2): via NASAL
  Administered 2016-02-08: 1 via NASAL
  Administered 2016-02-09: 21:00:00 via NASAL
  Administered 2016-02-09: 1 via NASAL
  Administered 2016-02-10: 10:00:00 via NASAL
  Filled 2016-02-07 (×2): qty 22

## 2016-02-07 MED ORDER — ATROPINE SULFATE 0.1 MG/ML IJ SOLN
INTRAMUSCULAR | Status: AC
  Filled 2016-02-07: qty 10

## 2016-02-07 MED ORDER — AMIODARONE HCL 200 MG PO TABS
200.0000 mg | ORAL_TABLET | Freq: Every day | ORAL | Status: DC
  Administered 2016-02-07 – 2016-02-10 (×4): 200 mg via ORAL
  Filled 2016-02-07 (×4): qty 1

## 2016-02-07 MED ORDER — LEVOFLOXACIN 750 MG PO TABS
750.0000 mg | ORAL_TABLET | Freq: Every day | ORAL | Status: DC
  Administered 2016-02-07 – 2016-02-09 (×3): 750 mg via ORAL
  Filled 2016-02-07 (×3): qty 1

## 2016-02-07 NOTE — Progress Notes (Signed)
Pharmacy Antibiotic Note  Sean Hinton is a 55 y.o. male admitted on 02/06/2016 for rapid AFlutter with patchy basilar opacities bilaterally concerning for infectious infiltrate.  Pharmacy has been consulted for Vancomycin/Zosyn dosing now switching to levofloxacin.  Afebrile, wbc now wnl. CrCl ~ 65 ml/min.   Plan: Levofloxacin 750 mg po qday x 5 days  Height: 5\' 9"  (175.3 cm) Weight: 161 lb 9.6 oz (73.301 kg) IBW/kg (Calculated) : 70.7  Temp (24hrs), Avg:98.4 F (36.9 C), Min:98 F (36.7 C), Max:98.8 F (37.1 C)   Recent Labs Lab 02/06/16 0046 02/06/16 0601 02/06/16 0645 02/07/16 0221  WBC 11.6*  --  11.7* 8.9  CREATININE 1.23 1.20  --  1.26*    Estimated Creatinine Clearance: 68.6 mL/min (by C-G formula based on Cr of 1.26).    No Known Allergies  02/09/16 02/07/2016 11:53 AM

## 2016-02-07 NOTE — Progress Notes (Signed)
CARDIOLOGY PROGRESS NOTE  Patient Name: Sean Hinton Date of Encounter: 02/07/2016 Primary Cardiologist: Dr Royann Shivers  Patient Profile: 55 y.o. male with a history of ICM w/ EF 35-40% 09/2015 echo, PAF on Xarelto, HTN, HLD, DM, cocaine hx, tobacco use, CAD w/ CTO LAD, DES RI 10/2015. D/c 04/06 after admit for CHF, wt at d/c 168 lbs. Admitted 04/14 w/ chest pain, UDS + cocaine, initial rhythm atrial flutter, spont conversion to SR.  SUBJECTIVE:  Underwent cath 4/14 for ongoing CP and minimally elevated troponin. Showed stable severe CAD. Levophed was increased for hypotension. On vanc and zosyn for possible infiltrate on CXR.   Now off levophed, SBP 90-100s. Snoring when I walked into room but when I woke him complains of CP. Breathing ok. Had Foley for urinary retention but now out. In NSR. On IV amio.     Cath 4/14  1. Mid LAD lesion, 100% stenosed. Known CTO 2. Widely patent stent in the RAMUS INTERMEDIUS with brisk flow distally. 3. Diffuse moderate disease throughout the entire RCA and distal RCA branches: Prox RCA lesion, 60% stenosed. Mid RCA lesion, 30% stenosed. Dist RCA lesion, 65% stenosed. 4. Post Atrio lesion, 50% stenosed. 1st RPLB lesion, 80% stenosed. 5. RPDA-2 lesion, 80% stenosed. RPDA-1 lesion, 60% stenosed. 6. Ost 1st Mrg to 1st Mrg lesion, 80% stenosed. Small-caliber vessel 7. There is moderate left ventricular systolic dysfunction. 8. Shock with systolic pressures in the 80s-90s. Levophed increased to 20 g/kilogram/min   OBJECTIVE Filed Vitals:   02/07/16 0400 02/07/16 0500 02/07/16 0600 02/07/16 0700  BP: 99/70 103/68 95/71 96/74   Pulse: 73 74 71 71  Temp:      TempSrc:      Resp: 19 14 10 29   Height:      Weight:      SpO2: 97% 99% 97% 99%    Intake/Output Summary (Last 24 hours) at 02/07/16 1007 Last data filed at 02/07/16 0600  Gross per 24 hour  Intake 1647.98 ml  Output   3175 ml  Net -1527.02 ml   Filed Weights   02/06/16 0523    Weight: 73.301 kg (161 lb 9.6 oz)    PHYSICAL EXAM General: Well developed, well nourished, male in no acute distress. Head: Normocephalic, atraumatic.  Neck: Supple without bruits, JVP 10 Lungs:  Resp regular and unlabored, few rales, generally clear. Heart: RRR, S1, S2, no S3, S4, soft murmur; high-pitched MR at apex Abdomen: Soft, non-tender, non-distended, BS + x 4.  Extremities: No clubbing, cyanosis, edema.  Neuro: Sleepy but rousable and oriented X 3. Moves all extremities spontaneously. Psych: Normal affect.  LABS: CBC:  Recent Labs  02/06/16 0046 02/06/16 0645 02/07/16 0221  WBC 11.6* 11.7* 8.9  NEUTROABS 8.9*  --   --   HGB 13.2 13.2 11.6*  HCT 40.7 39.8 35.3*  MCV 87.2 87.9 86.5  PLT 184 179 127*   INR:  Recent Labs  02/06/16 0046  INR 1.43   Basic Metabolic Panel:  Recent Labs  02/08/16 0601 02/07/16 0221  NA 138 137  K 4.9 5.0  CL 103 104  CO2 21* 21*  GLUCOSE 353* 273*  BUN 12 20  CREATININE 1.20 1.26*  CALCIUM 9.1 8.2*  MG 1.7  --    Cardiac Enzymes:  Recent Labs  02/06/16 0838 02/06/16 1125 02/06/16 1800  TROPONINI 0.11* 0.12* 0.13*    Recent Labs  02/06/16 0118  TROPIPOC 0.16*   BNP:  B NATRIURETIC PEPTIDE  Date/Time Value Ref Range Status  02/06/2016 12:46 AM 1224.6* 0.0 - 100.0 pg/mL Final  01/26/2016 06:48 PM 1378.3* 0.0 - 100.0 pg/mL Final   Fasting Lipid Panel:  Recent Labs  02/06/16 0601  CHOL 169  HDL 58  LDLCALC 91  TRIG 102  CHOLHDL 2.9   Drugs of Abuse     Component Value Date/Time   LABOPIA POSITIVE* 02/06/2016 0244   COCAINSCRNUR POSITIVE* 02/06/2016 0244   LABBENZ NONE DETECTED 02/06/2016 0244   AMPHETMU NONE DETECTED 02/06/2016 0244   THCU NONE DETECTED 02/06/2016 0244   LABBARB NONE DETECTED 02/06/2016 0244    TELE: SR, ST     Radiology/Studies: Dg Chest Portable 1 View 02/06/2016  CLINICAL DATA:  Chest pain, onset tonight after coughing. Hemoptysis. EXAM: PORTABLE CHEST 1 VIEW  COMPARISON:  01/27/2016 FINDINGS: Mild patchy opacities in the bases. No large effusions. Unchanged mild cardiomegaly. No pneumothorax. IMPRESSION: Patchy basilar opacities bilaterally may represent alveolar edema, infectious infiltrate, atelectasis. Electronically Signed   By: Ellery Plunk M.D.   On: 02/06/2016 03:11     Current Medications:  . atorvastatin  80 mg Oral QPM  . clopidogrel  75 mg Oral Q breakfast  . digoxin  0.125 mg Oral Daily  . furosemide  80 mg Oral BID  . insulin aspart  2-10 Units Subcutaneous TID WC  . insulin glargine  20 Units Subcutaneous Q2200  . pantoprazole  40 mg Oral Daily  . piperacillin-tazobactam (ZOSYN)  IV  3.375 g Intravenous 3 times per day  . potassium chloride SA  20 mEq Oral BID  . rivaroxaban  20 mg Oral Q supper  . sodium chloride flush  3 mL Intravenous Q12H  . vancomycin  1,000 mg Intravenous Q12H   . sodium chloride    . sodium chloride 10 mL/hr (02/06/16 1239)  . amiodarone 30 mg/hr (02/07/16 0924)  . nitroGLYCERIN Stopped (02/06/16 2015)  . norepinephrine (LEVOPHED) Adult infusion Stopped (02/06/16 1820)    ASSESSMENT AND PLAN:    1. Elevated troponin      --troponin minimally elevated. Possible demand ischemia in setting of cocaine use     --cath with stable CAD. Doubt CP is ischemic. ? Drug-seeking behavior  2. Hypotension/shock    --resolved. Restart HF meds as tolerated. Suspect he may not have been taking as outpatient.     --doubt sepsis. Can stop abx. Repeat CXR in am.   3. Severe CAD with iCM     --s/p stenting of RI in 1/17     --stable CAD on cath 02/06/16     --treat medically. Statin. Has been on Plavix and Xarelto as outpatient  4. Acute on chronic systolic HF due to iCM, EF 40-45% on echo 1/17     --volume status mildly elevated. Got IV lasix this am. Will convert to po. Bladder scan later.      --off b-blocker, ACE and hydralazine due to low BP. Restart hydralazine     --repeat echo   5. Ongoing cocaine  abuse    --consulted on need to stop   6. Type 2 diabetes mellitus with hyperglycemia (HCC)    --Poor control. Will increase lantus    --DM2 consult  7. Paroxysmal AF/Atrial flutter (HCC)    --He had atrial flutter on admission. He converted. Likely due to cocaine abuse. Can stop amio    --Has been on Xarelto as outpatient. Now restarted. Can restart outpatient amio as well.   8. Hypomagnesemia    --Recheck  9. Urinary retention    --Foley  out. Bladder scan if no output.    Can go to SDU.   Sabriya Yono,MD 10:16 AM

## 2016-02-07 NOTE — Progress Notes (Signed)
D/c'd right femoral sheath. Pressure held for 20 minutes. VS stable throughout. Site level 0. Pt educated. Will continue to monitor.

## 2016-02-07 NOTE — Progress Notes (Signed)
Pt educated on need to use urinal for measurement of urine output.

## 2016-02-08 ENCOUNTER — Inpatient Hospital Stay (HOSPITAL_COMMUNITY): Payer: Medicaid Other

## 2016-02-08 ENCOUNTER — Other Ambulatory Visit (HOSPITAL_COMMUNITY): Payer: Self-pay

## 2016-02-08 DIAGNOSIS — R079 Chest pain, unspecified: Secondary | ICD-10-CM

## 2016-02-08 LAB — BASIC METABOLIC PANEL
Anion gap: 9 (ref 5–15)
BUN: 23 mg/dL — AB (ref 6–20)
CO2: 26 mmol/L (ref 22–32)
CREATININE: 1.53 mg/dL — AB (ref 0.61–1.24)
Calcium: 8.4 mg/dL — ABNORMAL LOW (ref 8.9–10.3)
Chloride: 105 mmol/L (ref 101–111)
GFR calc Af Amer: 59 mL/min — ABNORMAL LOW (ref >60)
GFR, EST NON AFRICAN AMERICAN: 51 mL/min — AB (ref >60)
Glucose, Bld: 134 mg/dL — ABNORMAL HIGH (ref 65–99)
POTASSIUM: 3.5 mmol/L (ref 3.5–5.1)
SODIUM: 140 mmol/L (ref 135–145)

## 2016-02-08 LAB — GLUCOSE, CAPILLARY
GLUCOSE-CAPILLARY: 375 mg/dL — AB (ref 65–99)
GLUCOSE-CAPILLARY: 270 mg/dL — AB (ref 65–99)
Glucose-Capillary: 121 mg/dL — ABNORMAL HIGH (ref 65–99)
Glucose-Capillary: 411 mg/dL — ABNORMAL HIGH (ref 65–99)
Glucose-Capillary: 259 mg/dL — ABNORMAL HIGH (ref 65–99)

## 2016-02-08 LAB — ECHOCARDIOGRAM COMPLETE
HEIGHTINCHES: 69 in
WEIGHTICAEL: 2585.6 [oz_av]

## 2016-02-08 LAB — MAGNESIUM: MAGNESIUM: 1.7 mg/dL (ref 1.7–2.4)

## 2016-02-08 MED ORDER — MAGNESIUM SULFATE 2 GM/50ML IV SOLN
2.0000 g | Freq: Once | INTRAVENOUS | Status: AC
  Administered 2016-02-08: 2 g via INTRAVENOUS
  Filled 2016-02-08: qty 50

## 2016-02-08 MED ORDER — LISINOPRIL 10 MG PO TABS
10.0000 mg | ORAL_TABLET | Freq: Every day | ORAL | Status: DC
  Administered 2016-02-08 – 2016-02-10 (×3): 10 mg via ORAL
  Filled 2016-02-08: qty 1
  Filled 2016-02-08: qty 2
  Filled 2016-02-08: qty 1

## 2016-02-08 NOTE — Progress Notes (Signed)
CARDIOLOGY PROGRESS NOTE  Patient Name: Sean Hinton Date of Encounter: 02/08/2016 Primary Cardiologist: Dr Royann Shivers  Patient Profile: 55 y.o. male with a history of ICM w/ EF 35-40% 09/2015 echo, PAF on Xarelto, HTN, HLD, DM, cocaine hx, tobacco use, CAD w/ CTO LAD, DES RI 10/2015. D/c 04/06 after admit for CHF, wt at d/c 168 lbs. Admitted 04/14 w/ chest pain, UDS + cocaine, initial rhythm atrial flutter, spont conversion to SR.  SUBJECTIVE:  Underwent cath 4/14 for ongoing CP and minimally elevated troponin. Showed stable severe CAD. Levophed was increased for hypotension. On vanc and zosyn for possible infiltrate on CXR.   Levophed stopped 4/15.  Foley for urinary retention but now out. Abx switched to levofloxacin yesterday.   SBP stable 115-130. Feels good. No CP or dyspnea.   Cath 4/14  1. Mid LAD lesion, 100% stenosed. Known CTO 2. Widely patent stent in the RAMUS INTERMEDIUS with brisk flow distally. 3. Diffuse moderate disease throughout the entire RCA and distal RCA branches: Prox RCA lesion, 60% stenosed. Mid RCA lesion, 30% stenosed. Dist RCA lesion, 65% stenosed. 4. Post Atrio lesion, 50% stenosed. 1st RPLB lesion, 80% stenosed. 5. RPDA-2 lesion, 80% stenosed. RPDA-1 lesion, 60% stenosed. 6. Ost 1st Mrg to 1st Mrg lesion, 80% stenosed. Small-caliber vessel 7. There is moderate left ventricular systolic dysfunction. 8. Shock with systolic pressures in the 80s-90s. Levophed increased to 20 g/kilogram/min   OBJECTIVE Filed Vitals:   02/07/16 2200 02/08/16 0014 02/08/16 0300 02/08/16 0900  BP:  110/70 115/79 125/90  Pulse: 81   89  Temp:    98.2 F (36.8 C)  TempSrc:    Oral  Resp:      Height:      Weight:      SpO2: 100%  100% 98%    Intake/Output Summary (Last 24 hours) at 02/08/16 1048 Last data filed at 02/08/16 0954  Gross per 24 hour  Intake 1814.32 ml  Output   1000 ml  Net 814.32 ml   Filed Weights   02/06/16 0523  Weight: 73.301 kg (161 lb  9.6 oz)    PHYSICAL EXAM General: Well developed, well nourished, male in no acute distress. Head: Normocephalic, atraumatic.  Neck: Supple without bruits, JVP 6-7 Lungs:  Resp regular and unlabored, few rales, generally clear. Heart: RRR, S1, S2, no S3, S4, soft murmur; high-pitched MR at apex Abdomen: Soft, non-tender, non-distended, BS + x 4.  Extremities: No clubbing, cyanosis, edema.  Neuro: Sleepy but rousable and oriented X 3. Moves all extremities spontaneously. Psych: Normal affect.  LABS: CBC:  Recent Labs  02/06/16 0046 02/06/16 0645 02/07/16 0221  WBC 11.6* 11.7* 8.9  NEUTROABS 8.9*  --   --   HGB 13.2 13.2 11.6*  HCT 40.7 39.8 35.3*  MCV 87.2 87.9 86.5  PLT 184 179 127*   INR:  Recent Labs  02/06/16 0046  INR 1.43   Basic Metabolic Panel:  Recent Labs  63/89/37 0601 02/07/16 0221 02/08/16 0258  NA 138 137 140  K 4.9 5.0 3.5  CL 103 104 105  CO2 21* 21* 26  GLUCOSE 353* 273* 134*  BUN 12 20 23*  CREATININE 1.20 1.26* 1.53*  CALCIUM 9.1 8.2* 8.4*  MG 1.7  --  1.7   Cardiac Enzymes:  Recent Labs  02/06/16 0838 02/06/16 1125 02/06/16 1800  TROPONINI 0.11* 0.12* 0.13*    Recent Labs  02/06/16 0118  TROPIPOC 0.16*   BNP:  B NATRIURETIC PEPTIDE  Date/Time Value  Ref Range Status  02/06/2016 12:46 AM 1224.6* 0.0 - 100.0 pg/mL Final  01/26/2016 06:48 PM 1378.3* 0.0 - 100.0 pg/mL Final   Fasting Lipid Panel:  Recent Labs  02/06/16 0601  CHOL 169  HDL 58  LDLCALC 91  TRIG 102  CHOLHDL 2.9   Drugs of Abuse     Component Value Date/Time   LABOPIA POSITIVE* 02/06/2016 0244   COCAINSCRNUR POSITIVE* 02/06/2016 0244   LABBENZ NONE DETECTED 02/06/2016 0244   AMPHETMU NONE DETECTED 02/06/2016 0244   THCU NONE DETECTED 02/06/2016 0244   LABBARB NONE DETECTED 02/06/2016 0244    TELE: SR, ST     Radiology/Studies: Dg Chest Portable 1 View 02/06/2016  CLINICAL DATA:  Chest pain, onset tonight after coughing. Hemoptysis. EXAM:  PORTABLE CHEST 1 VIEW COMPARISON:  01/27/2016 FINDINGS: Mild patchy opacities in the bases. No large effusions. Unchanged mild cardiomegaly. No pneumothorax. IMPRESSION: Patchy basilar opacities bilaterally may represent alveolar edema, infectious infiltrate, atelectasis. Electronically Signed   By: Ellery Plunk M.D.   On: 02/06/2016 03:11     Current Medications:  . amiodarone  200 mg Oral Daily  . atorvastatin  80 mg Oral QPM  . clopidogrel  75 mg Oral Q breakfast  . digoxin  0.125 mg Oral Daily  . furosemide  80 mg Oral BID  . insulin aspart  2-10 Units Subcutaneous TID WC  . insulin glargine  30 Units Subcutaneous Q2200  . levofloxacin  750 mg Oral Daily  . mupirocin ointment   Nasal BID  . pantoprazole  40 mg Oral Daily  . rivaroxaban  20 mg Oral Q supper  . sodium chloride flush  3 mL Intravenous Q12H   . sodium chloride    . sodium chloride 10 mL/hr (02/06/16 1239)    ASSESSMENT AND PLAN:    1. CP/Elevated troponin      --troponin minimally elevated. Possible demand ischemia in setting of cocaine use     --cath with stable CAD. Doubt CP is ischemic. ? Drug-seeking behavior  2. Hypotension/shock    --resolved.     --doubt sepsis. On po abx for possible URI/PNA. CXR has cleared. On day 2/5 levaquin  3. Severe CAD with iCM     --s/p stenting of RI in 1/17     --stable CAD on cath 02/06/16     --treat medically. Statin. Has been on Plavix and Xarelto as outpatient. No ASA  4. Acute on chronic systolic HF due to iCM, EF 40-45% on echo 1/17     --volume status improved. Back on home lasix     --off b-blocker, ACE due to low BP. Hydralazine restarted. Will restart ACE today     --repeat echo pending  5. Ongoing cocaine abuse    --consulted on need to stop   6. Type 2 diabetes mellitus with hyperglycemia (HCC)    --Improved control on increased lantus  7. Paroxysmal AF/Atrial flutter (HCC)    --He had atrial flutter on admission. He converted. Likely due to cocaine  abuse.    --Has been on Xarelto an po amio as outpatient. Now restarted.   8. Hypomagnesemia    --Will supp.   9. Urinary retention    --Foley out. Bladder scan if no output.    Can go to tele. Likely home in am.   Bensimhon, Daniel,MD 10:48 AM

## 2016-02-08 NOTE — Progress Notes (Signed)
  Echocardiogram 2D Echocardiogram has been performed.  Nolon Rod 02/08/2016, 11:25 AM

## 2016-02-09 ENCOUNTER — Ambulatory Visit (HOSPITAL_COMMUNITY): Payer: Self-pay

## 2016-02-09 DIAGNOSIS — N179 Acute kidney failure, unspecified: Secondary | ICD-10-CM

## 2016-02-09 DIAGNOSIS — N189 Chronic kidney disease, unspecified: Secondary | ICD-10-CM

## 2016-02-09 LAB — GLUCOSE, CAPILLARY
GLUCOSE-CAPILLARY: 224 mg/dL — AB (ref 65–99)
GLUCOSE-CAPILLARY: 188 mg/dL — AB (ref 65–99)
Glucose-Capillary: 223 mg/dL — ABNORMAL HIGH (ref 65–99)
Glucose-Capillary: 232 mg/dL — ABNORMAL HIGH (ref 65–99)

## 2016-02-09 LAB — BASIC METABOLIC PANEL
ANION GAP: 9 (ref 5–15)
BUN: 21 mg/dL — ABNORMAL HIGH (ref 6–20)
CHLORIDE: 104 mmol/L (ref 101–111)
CO2: 26 mmol/L (ref 22–32)
Calcium: 8.1 mg/dL — ABNORMAL LOW (ref 8.9–10.3)
Creatinine, Ser: 1.85 mg/dL — ABNORMAL HIGH (ref 0.61–1.24)
GFR calc Af Amer: 47 mL/min — ABNORMAL LOW (ref >60)
GFR, EST NON AFRICAN AMERICAN: 40 mL/min — AB (ref >60)
GLUCOSE: 293 mg/dL — AB (ref 65–99)
POTASSIUM: 3.9 mmol/L (ref 3.5–5.1)
Sodium: 139 mmol/L (ref 135–145)

## 2016-02-09 MED ORDER — LEVOFLOXACIN 750 MG PO TABS: 750.0000 mg | ORAL_TABLET | ORAL | Status: DC

## 2016-02-09 MED ORDER — RIVAROXABAN 15 MG PO TABS
15.0000 mg | ORAL_TABLET | Freq: Every day | ORAL | Status: DC
  Administered 2016-02-09: 15 mg via ORAL
  Filled 2016-02-09: qty 1

## 2016-02-09 NOTE — Progress Notes (Signed)
Inpatient Diabetes Program Recommendations  AACE/ADA: New Consensus Statement on Inpatient Glycemic Control (2015)  Target Ranges:  Prepandial:   less than 140 mg/dL      Peak postprandial:   less than 180 mg/dL (1-2 hours)      Critically ill patients:  140 - 180 mg/dL   Results for CALYB, MCQUARRIE (MRN [id number]) as of 02/09/2016 09:52  Ref. Range 02/08/2016 11:48 02/08/2016 11:49 02/08/2016 16:46 02/08/2016 21:42 02/09/2016 06:43  Glucose-Capillary Latest Ref Range: 65-99 mg/dL 827 (H) 078 (H) 675 (H) 259 (H) 224 (H)   Review of Glycemic Control  Current orders for Inpatient glycemic control: Lantus 30, Novolog 2-10 units TID  Inpatient Diabetes Program Recommendations:   Glucose still elevated in the 200's. Please consider increasing Lantus to 35 units.  A1c increased significantly from 9.9% on 11/19/2015 to 13.4% on 01/26/2016. DM Coordinator spoke with patient during his last admission. Patient needs to follow up with PCP for insulin adjustments while outpatient.  Thanks,  Christena Deem RN, MSN, [facility] Inpatient Diabetes Coordinator Team Pager [phone] (8a-5p)

## 2016-02-09 NOTE — Clinical Documentation Improvement (Signed)
Cardiology  Can the diagnosis of MI be further specified?  Was the NSTEMI ruled in or ruled out   NSTEMI Ruled in  NSTEMI Ruled out  Other  Clinically Undetermined  Document any associated diagnoses/conditions.   Supporting Information: Documented as NSTEMi; Had cath; now documented as demand ischemia from cocaine use.   Please exercise your independent, professional judgment when responding. A specific answer is not anticipated or expected.   Thank Modesta Messing [facility] Health Information Management [place] [phone]

## 2016-02-09 NOTE — Progress Notes (Signed)
Patient Name: Sean Hinton Date of Encounter: 02/09/2016  Primary Cardiologist: Dr Royann Shivers    Patient profile: 55 y.o. male with a history of ICM w/ EF 35-40% 09/2015 echo, PAF on Xarelto, HTN, HLD, DM, cocaine hx, tobacco use, CAD w/ CTO LAD, DES RI 10/2015. D/c 04/06 after admit for CHF, wt at d/c 168 lbs. Admitted 04/14 w/ chest pain, UDS + cocaine, initial rhythm atrial flutter, spont conversion to SR.  Principal Problem:   NSTEMI (non-ST elevated myocardial infarction) (HCC) Active Problems:   Acute on chronic combined systolic and diastolic congestive heart failure (HCC)   Cocaine abuse   Type 2 diabetes mellitus with hyperglycemia (HCC)   Atrial flutter (HCC)   Presence of drug coated stent in left circumflex coronary artery    SUBJECTIVE  Denies any CP or SOB. Feels good enough to go home.   CURRENT MEDS . amiodarone  200 mg Oral Daily  . atorvastatin  80 mg Oral QPM  . clopidogrel  75 mg Oral Q breakfast  . digoxin  0.125 mg Oral Daily  . furosemide  80 mg Oral BID  . insulin aspart  2-10 Units Subcutaneous TID WC  . insulin glargine  30 Units Subcutaneous Q2200  . levofloxacin  750 mg Oral Daily  . lisinopril  10 mg Oral Daily  . mupirocin ointment   Nasal BID  . pantoprazole  40 mg Oral Daily  . rivaroxaban  20 mg Oral Q supper  . sodium chloride flush  3 mL Intravenous Q12H    OBJECTIVE  Filed Vitals:   02/08/16 1603 02/08/16 2139 02/09/16 0539 02/09/16 1027  BP: 117/95 118/79 130/94 137/97  Pulse: 84 83 81 85  Temp: 98.2 F (36.8 C) 98.5 F (36.9 C) 98.6 F (37 C)   TempSrc: Oral Oral Oral   Resp: 18 18 16    Height:      Weight:      SpO2: 100% 100% 100%     Intake/Output Summary (Last 24 hours) at 02/09/16 1050 Last data filed at 02/08/16 1700  Gross per 24 hour  Intake    600 ml  Output      0 ml  Net    600 ml   Filed Weights   02/06/16 0523  Weight: 161 lb 9.6 oz (73.301 kg)    PHYSICAL EXAM  General: Pleasant, NAD. Neuro:  Alert and oriented X 3. Moves all extremities spontaneously. Psych: Normal affect. HEENT:  Normal  Neck: Supple without bruits or JVD. Lungs:  Resp regular and unlabored. Diminished breath sound in bibasilar area, otherwise CTA, no rale.  Heart: RRR no s3, s4. 2/6 systolic murmur at apex Abdomen: Soft, non-distended, BS + x 4. Sizeable and tender R femoral hematoma, no bleeding, no bruit, not pulsatile. Extremities: No clubbing, cyanosis or edema. DP/PT/Radials 2+ and equal bilaterally.  Accessory Clinical Findings  CBC  Recent Labs  02/07/16 0221  WBC 8.9  HGB 11.6*  HCT 35.3*  MCV 86.5  PLT 127*   Basic Metabolic Panel  Recent Labs  02/08/16 0258 02/09/16 0235  NA 140 139  K 3.5 3.9  CL 105 104  CO2 26 26  GLUCOSE 134* 293*  BUN 23* 21*  CREATININE 1.53* 1.85*  CALCIUM 8.4* 8.1*  MG 1.7  --    Cardiac Enzymes  Recent Labs  02/06/16 1125 02/06/16 1800  TROPONINI 0.12* 0.13*    TELE NSR without afib    ECG  No new EKG  Echocardiogram 02/08/2016  LV EF: 40% - 45%  ------------------------------------------------------------------- Indications: Chest pain 786.51.  ------------------------------------------------------------------- History: PMH: Coronary artery disease. Congestive heart failure. Risk factors: Current tobacco use. Hypertension. Diabetes mellitus.  ------------------------------------------------------------------- Study Conclusions  - Left ventricle: The cavity size was normal. Wall thickness was  normal. Systolic function was mildly to moderately reduced. The  estimated ejection fraction was in the range of 40% to 45%.  Severe hypokinesis of the basal-midinferolateral and inferior  myocardium. Akinesis of the apical myocardium. Moderate  hypokinesis of the mid-apicalanteroseptal and anterior  myocardium. Doppler parameters are consistent with restrictive  physiology, indicative of decreased left  ventricular diastolic  compliance and/or increased left atrial pressure. There was a  possible, small, 1.1 cm (L) x 0.6 cm (W), flat (mural),  calcified, fixed, apicalthrombus. - Mitral valve: There was severe regurgitation directed  eccentrically and posteriorly. Severe regurgitation is suggested  by pulmonary vein systolic flow reversal. - Left atrium: The atrium was moderately to severely dilated. - Right ventricle: The cavity size was mildly dilated. - Right atrium: The atrium was moderately to severely dilated. - Tricuspid valve: There was moderate regurgitation. - Pulmonary arteries: Systolic pressure was moderately increased.  PA peak pressure: 54 mm Hg (S).    Radiology/Studies  Dg Chest Portable 1 View  02/06/2016  CLINICAL DATA:  Chest pain, onset tonight after coughing. Hemoptysis. EXAM: PORTABLE CHEST 1 VIEW COMPARISON:  01/27/2016 FINDINGS: Mild patchy opacities in the bases. No large effusions. Unchanged mild cardiomegaly. No pneumothorax. IMPRESSION: Patchy basilar opacities bilaterally may represent alveolar edema, infectious infiltrate, atelectasis. Electronically Signed   By: Ellery Plunk M.D.   On: 02/06/2016 03:11   Dg Chest Port 1 View  01/27/2016  CLINICAL DATA:  Short of breath EXAM: PORTABLE CHEST 1 VIEW COMPARISON:  12/09/2015 FINDINGS: Upper normal heart size. Focal patchy density in the right mid lung zone persists. Small right pleural effusion has developed. No pneumothorax. Patchy bibasilar airspace opacities have improved. IMPRESSION: Patchy bibasilar airspace opacities have improved. Patchy right midlung opacity persists. Continued followup until complete resolution is recommended. If the abnormality fails to resolve, underlying nodule cannot be excluded. Electronically Signed   By: Jolaine Click M.D.   On: 01/27/2016 13:31   Dg Chest Port 1v Same Day  02/07/2016  CLINICAL DATA:  55 year old male with history of chest pain vision cocaine use. Persistent  chest pain. EXAM: PORTABLE CHEST 1 VIEW COMPARISON:  Chest x-ray 02/06/2016. FINDINGS: Lung volumes are normal. No consolidative airspace disease. No pleural effusion. No pneumothorax. No suspicious appearing pulmonary nodules or masses. Heart size is mildly enlarged (unchanged). Upper mediastinal contours are within normal limits. IMPRESSION: 1. No radiographic evidence of acute cardiopulmonary disease. 2. Mild cardiomegaly. Electronically Signed   By: Trudie Reed M.D.   On: 02/07/2016 13:21    ASSESSMENT AND PLAN  1. PAF with RVR - currently in NSR   2. Elevated trop likely demand ischemia with rapid afib and cocaine abuse  - cath 02/06/2016 100% known CTO of mid LAD, widely patent stent in RI, 60% mid RCA, 80% 1st RPLB, 80% RPDA, 80% st OM1. No significant change from post PCI film in Jan  - Echo 02/08/2016 EF 40-45%, severe hypokinesis of basal mid inferolateral and inferior myocardium, possible small 1.1cm x 0.6 cm flat apical thrombus. Severe MR. Moderate TR. PA peak pressure  - R femoral cath site does have a sizeable hematoma, however nonpulsatile, no bruit on exam, relatively lower suspicion for pseudoaneurysm.  - will discuss with MD to  see if need to do anything for possible apical thrombus otherwise restarting Xarelto  3. Hypotensin: doubt sepsis, resolved  4. CAD   - s/p stenting of RI in 1/17   - stable CAD on cath 02/06/16  -  treat medically. Statin. Has been on Plavix and Xarelto as outpatient. No ASA  5. Cough/fever/hemoptysis: on levaquin for presumed PNA, will need 2 more doses after discharge to finish 5 day course.  6. DM: on insulin  7. Cocaine abuse  8. Acute kidney injury: Cr. 1.2 --> 1.53 --> 1.85. Question combination of contrast during cath and hypotension.  Ramond Dial PA-C Pager: [phone]  I have seen and examined the patient along with Azalee Course PA-C.  I have reviewed the chart, notes and new data.  I agree with PA's note.  Key new  complaints: no angina or dyspnea, but right groin is very tender Key examination changes: loud MR murmur, S3 present, tender hematoma right groin Key new findings / data: creatinine steadily worse (hypotension and/or contrast related kidney injury).  PLAN: He is not ready for DC. Scan R groin for pseudoaneurysm. Keep in house to monitor renal function and UO.  If he can kick his cocaine habit, he would need MV repair/replacement. He promises to do so, but this is not the first time he has made that promise.  [name], MD, [facility] [phone] 02/09/2016, 12:09 PM

## 2016-02-09 NOTE — Progress Notes (Signed)
Utilization review completed.  

## 2016-02-09 NOTE — Clinical Documentation Improvement (Signed)
Cardiology  Can the diagnosis of Shock be further specified?   Shock, including Type:  Septic, Cardiogenic, Hyper/Hypoglycemic, Hypovolemic, Hemorrhagic, Neurogenic, Anaphylactic, Other type, including suspected or known cause and/or associated condition(s)  Other  Clinically Undetermined  Please clarify reason for shock.  Document any associated diagnoses/conditions.   Supporting Information: Documented in record as shock.   Please exercise your independent, professional judgment when responding. A specific answer is not anticipated or expected.   Thank Modesta Messing [facility] Health Information Management [place] [phone]

## 2016-02-09 NOTE — Progress Notes (Signed)
ANTIBIOTIC CONSULT NOTE -  Pharmacy Consult for Levaquin Indication: pneumonia  No Known Allergies  Patient Measurements: Height: 5\' 9"  (175.3 cm) Weight: 161 lb 9.6 oz (73.301 kg) IBW/kg (Calculated) : 70.7  Vital Signs: Temp: 98.6 F (37 C) (04/17 0539) Temp Source: Oral (04/17 0539) BP: 137/97 mmHg (04/17 1027) Pulse Rate: 85 (04/17 1027) Intake/Output from previous day: 04/16 0701 - 04/17 0700 In: 1140 [P.O.:1140] Out: -  Intake/Output from this shift: Total I/O In: 360 [P.O.:360] Out: -   Labs:  Recent Labs  02/07/16 0221 02/08/16 0258 02/09/16 0235  WBC 8.9  --   --   HGB 11.6*  --   --   PLT 127*  --   --   CREATININE 1.26* 1.53* 1.85*   Estimated Creatinine Clearance: 46.7 mL/min (by C-G formula based on Cr of 1.85). No results for input(s): VANCOTROUGH, VANCOPEAK, VANCORANDOM, GENTTROUGH, GENTPEAK, GENTRANDOM, TOBRATROUGH, TOBRAPEAK, TOBRARND, AMIKACINPEAK, AMIKACINTROU, AMIKACIN in the last 72 hours.   Microbiology:   Medical History: Past Medical History  Diagnosis Date  . Diabetes mellitus with complication (HCC)   . Hypertension   . Syncope     a. 02/2015 - felt 2/2 rapid AF/AFL.  03/2015 Atrial fibrillation and flutter (HCC)     a. Dx 02/2015 - placed on Xarelto; b. 09/2015 recurrence in setting of PNA.  . Ischemic cardiomyopathy     a. 09/2015 Echo: EF 35-40%.  10/2015 CAD (coronary artery disease)     a. Dx 02/2015 - 2V CAD with CTO LAD, diffuse dz in PDA, OM - med rx.  . Hypertensive heart disease   . Hyperlipidemia   . Mitral regurgitation     a. Mod by echo 02/2015,  . Tobacco abuse   . Chronic systolic CHF (congestive heart failure) (HCC)     a. 09/2015 Echo: EF 35-40%, sev apical/antlat, apical HK, mild MR, mildly dil LA.  10/2015 ARF (acute respiratory failure) (HCC)   . Pneumonia 09/2015  . GERD (gastroesophageal reflux disease)   . Headache   . Arthritis     ra  . NSTEMI (non-ST elevated myocardial infarction) (HCC) 10/2015     Assessment:  Anticoagulation: Xarelto PTA for PAF. Possible apical thrombus. F/u plans. Noted R femoral cath (4/14) site hematoma. Hgb 13.2>11.6, plt 179>127. Decrease Xarelto to 15mg  daily for now with rising Scr.   Infectious Disease: Day #4 for ?infiltrate on CXR. Afebrile, WBC 8.9. Sed rate 30. Scr up. Cultures still pending. Adjust Levaquin dosing for CrCl<50 --4/14 CXR patchy basilar opacities bilaterally - poss alveolar edema vs infectious infiltrate vs atelectasis  Vancomycin 4/14> 4/15 Zosyn 4/14> 4/15 Levofloxacin 4/15>> total duration 5 days, end date added (4/20)  4/14 MRSA pos 4/14 blood >>   Goal of Therapy:  Eradication of infection   Plan:  Xarelto 15 mg po daily (decreased) for CrCl<50. Levofloxacin x 5 days (end date entered), change to every 48h x 1 more dose. Scan R groin for pseudoaneurysm   Sean Leidner S. [birth year]-19-1971, PharmD, BCPS Clinical Staff Pharmacist Pager [phone]  Merilynn Finland Stillinger 02/09/2016,1:35 PM

## 2016-02-10 ENCOUNTER — Encounter (HOSPITAL_COMMUNITY): Payer: Self-pay | Admitting: General Practice

## 2016-02-10 ENCOUNTER — Inpatient Hospital Stay (HOSPITAL_COMMUNITY): Payer: Medicaid Other

## 2016-02-10 DIAGNOSIS — I248 Other forms of acute ischemic heart disease: Secondary | ICD-10-CM

## 2016-02-10 DIAGNOSIS — R1030 Lower abdominal pain, unspecified: Secondary | ICD-10-CM

## 2016-02-10 LAB — BASIC METABOLIC PANEL
Anion gap: 9 (ref 5–15)
BUN: 19 mg/dL (ref 6–20)
CALCIUM: 8.5 mg/dL — AB (ref 8.9–10.3)
CO2: 25 mmol/L (ref 22–32)
CREATININE: 1.19 mg/dL (ref 0.61–1.24)
Chloride: 106 mmol/L (ref 101–111)
GFR calc non Af Amer: >60 mL/min (ref >60)
Glucose, Bld: 207 mg/dL — ABNORMAL HIGH (ref 65–99)
Potassium: 3.8 mmol/L (ref 3.5–5.1)
SODIUM: 140 mmol/L (ref 135–145)

## 2016-02-10 LAB — GLUCOSE, CAPILLARY
Glucose-Capillary: 159 mg/dL — ABNORMAL HIGH (ref 65–99)
Glucose-Capillary: 176 mg/dL — ABNORMAL HIGH (ref 65–99)

## 2016-02-10 MED ORDER — LISINOPRIL 10 MG PO TABS: 10.0000 mg | ORAL_TABLET | Freq: Every day | ORAL | Status: AC

## 2016-02-10 MED ORDER — LEVOFLOXACIN 750 MG PO TABS
750.0000 mg | ORAL_TABLET | Freq: Every day | ORAL | Status: DC
  Administered 2016-02-10: 750 mg via ORAL
  Filled 2016-02-10: qty 1

## 2016-02-10 MED FILL — ?LISINOPRIL 10 MG TABLET: 10 | 30 days supply | Qty: 30 | Fill #0

## 2016-02-10 NOTE — Discharge Summary (Signed)
Discharge Summary    Patient ID: Sean Hinton,  MRN: [id number], DOB/AGE: September 05, 1961 55 y.o.  Admit date: 02/06/2016 Discharge date: 02/10/2016  Primary Care Provider: Lora Paula Primary Cardiologist: Dr. Royann Shivers  Discharge Diagnoses    Principal Problem:   NSTEMI (non-ST elevated myocardial infarction) [facility]) Active Problems:   Acute on chronic combined systolic and diastolic congestive heart failure (HCC)   Cocaine abuse   Type 2 diabetes mellitus with hyperglycemia (HCC)   Atrial flutter (HCC)   Presence of drug coated stent in left circumflex coronary artery   Allergies No Known Allergies  Diagnostic Studies/Procedures    Cardiac cath 02/06/2016  Procedures    Left Heart Cath and Coronary Angiography    Conclusion    1. Mid LAD lesion, 100% stenosed. Known CTO 2. Widely patent stent in the RAMUS INTERMEDIUS with brisk flow distally. 3. Diffuse moderate disease throughout the entire RCA and distal RCA branches: Prox RCA lesion, 60% stenosed. Mid RCA lesion, 30% stenosed. Dist RCA lesion, 65% stenosed. 4. Post Atrio lesion, 50% stenosed. 1st RPLB lesion, 80% stenosed. 5. RPDA-2 lesion, 80% stenosed. RPDA-1 lesion, 60% stenosed. 6. Ost 1st Mrg to 1st Mrg lesion, 80% stenosed. Small-caliber vessel 7. There is moderate left ventricular systolic dysfunction. 8. Shock with systolic pressures in the 80s-90s. Levophed increased to 20 g/kilogram/min   Angiographically no significant change from post-PCI films in January. As diffuse disease throughout the entire right coronary and branches that would require extensive stent work. With his questionable medication adherence, I would not recommend multiple stents.  Thankfully the Ramus Intermedius stent is widely patent. We ascended P2Y 12 inhibitor assay to ensure that he has been taking his Plavix. He has been on Xarelto which will need to be held 8 hours following sheath removal  Plan:  Transfer to ICU  for closer monitoring on Levophed  Despite reduced EF admission atrial flutter with shock he does not appear to be in florid pulmonary edema based on EDP of 13  Continue to hold antihypertensive agents. I suspect that he was not taking all the medications and therefore when they were given his blood pressure dropped.  Continue IV amiodarone to maintain sinus rhythm.  Restart Xarelto 8 hours after femoral sheath removal. I suspect he may need a radial art line for monitoring pressures. Keep the femoral line in place until this is done.     Echo 02/08/2016 LV EF: 40% - 45%  ------------------------------------------------------------------- Indications: Chest pain 786.51.  ------------------------------------------------------------------- History: PMH: Coronary artery disease. Congestive heart failure. Risk factors: Current tobacco use. Hypertension. Diabetes mellitus.  ------------------------------------------------------------------- Study Conclusions  - Left ventricle: The cavity size was normal. Wall thickness was  normal. Systolic function was mildly to moderately reduced. The  estimated ejection fraction was in the range of 40% to 45%.  Severe hypokinesis of the basal-midinferolateral and inferior  myocardium. Akinesis of the apical myocardium. Moderate  hypokinesis of the mid-apicalanteroseptal and anterior  myocardium. Doppler parameters are consistent with restrictive  physiology, indicative of decreased left ventricular diastolic  compliance and/or increased left atrial pressure. There was a  possible, small, 1.1 cm (L) x 0.6 cm (W), flat (mural),  calcified, fixed, apicalthrombus. - Mitral valve: There was severe regurgitation directed  eccentrically and posteriorly. Severe regurgitation is suggested  by pulmonary vein systolic flow reversal. - Left atrium: The atrium was moderately to severely dilated. - Right ventricle: The cavity  size was mildly dilated. - Right atrium: The atrium was moderately to severely  dilated. - Tricuspid valve: There was moderate regurgitation. - Pulmonary arteries: Systolic pressure was moderately increased.  PA peak pressure: 54 mm Hg (S).  R femoral arterial U/S Vascular Ultrasound Right groin arterial evaluation limited has been completed. Preliminary findings: No evidence of pseudoaneurysm or hematoma of the right groin. Multiple enlarged lymph nodes are visualized in this area.  _____________   History of Present Illness     Sean Hinton is a 55 y.o. male with a history of ICM w/ EF 35-40% 09/2015 echo, PAF on Xarelto, HTN, HLD, DM, cocaine hx, tobacco use, CAD w/ CTO LAD, DES RI 10/2015 who presents with CP and rapid AFL. He states around 7-8pm today he develop abrupt onset CP similar to his prior MI. + SOB. Earlier that day, he noted some blood in his sputum. Prior to this, he felt well. He denied weight gain or edema, but noted he was recently admitted for a CHF exacerbation and required IV diuresis. He denied using cocaine.  Initially, EMS was concerned for STEMI and called a CODE STEMI in the field. On arrival to the ER, he was tacyhcardic to 150 but otherwise hemodynamically stable. ECG demonstrated AFL with 2:1 block and ventricular rate of 153bpm. Probable old ASMI. Labs were notable for K 4.1, Cr 1.23, BNP 1224, POC TnI 0.16, WBC 11.6. He was started on IV amiodarone and converted. Follow-up ECG demonstrated NSR, biatrial enlargement, probable old anterior infarct with borderline STE V3/V4, somewhat more pronounced than prior. Utox resulted positive for cocaine. CXR demonstrated Patchy basilar opacities bilaterally may represent alveolar edema, infectious infiltrate, atelectasis.  After arrival to the floor, he became tachypneic to 40 and spiked a low grade fever to 100.5. On my exam, he repots that his CP is worse with cough and palpitation.   Hospital Course      Patient  was admitted to cardiology service, He underwent cardiac catheterization on 02/06/2016 which showed 100% mid LAD lesion which is a known CTO, widely patent stent to the ramus intermedius, 80% RPDA 2 lesion, 60% RPDA and one lesion, 80% 1st RPLB lesion, 80% ostial OM1 lesion. Post cath, patient did have significant hypotension requiring levophed. He was started on IV amiodarone to help maintain sinus rhythm. Xarelto was restarted 8 hours after sheath removal. He did convert on amiodarone. Echocardiogram obtained on 02/08/2016 showed EF 40-45%, severe hypokinesis of the basal mid inferolateral and inferior myocardium, akinesis of the apical myocardium, moderate hypokinesis of the mid apical anteroseptal and anterior myocardium, Severe MR,moderate TR, peak PA pressure 54 mmHg.  Due to combination of hypotension and contrast used during cath, he did develop acute kidney injury, His creatinine increased to 1.85 on 4/17. He was kept in the hospital overnight. He was seen on the following morning on 02/10/2016, creatinine has significantly improved to 1.1. He is no longer having any chest discomfort. He is deemed stable for discharge from cardiology perspective. Emphasis placed on cessation from cocaine before we can consider valve repair/replacement.  Of note, due to hypotension, most his blood pressure medications has been held during this admission, his systolic blood pressure is 130 to 140s now I will gradually restart his blood pressure medication. I have restarted his diltiazem and lisinopril. I will continue to hold his amlodipine, hydralazine, coreg and spironolactone given recent hypotension. We can restart Imdur and hydralazine on followup if BP stable. Will hold coreg until he can prove he is clean off cocaine. During this hospitalization, he was treated for fever, chill and cough  with Levaquin x 6 doses. Since he no longer have any symptom, he was not discharged on levaquin.    _____________  Discharge  Vitals Blood pressure 145/104, pulse 84, temperature 98.4 F (36.9 C), temperature source Oral, resp. rate 18, height 5\' 9"  (1.753 m), weight 171 lb 3.2 oz (77.656 kg), SpO2 100 %.  Filed Weights   02/06/16 0523 02/09/16 1901  Weight: 161 lb 9.6 oz (73.301 kg) 171 lb 3.2 oz (77.656 kg)    Labs & Radiologic Studies     Basic Metabolic Panel  Recent Labs  02/08/16 0258 02/09/16 0235 02/10/16 0337  NA 140 139 140  K 3.5 3.9 3.8  CL 105 104 106  CO2 26 26 25   GLUCOSE 134* 293* 207*  BUN 23* 21* 19  CREATININE 1.53* 1.85* 1.19  CALCIUM 8.4* 8.1* 8.5*  MG 1.7  --   --     Dg Chest Portable 1 View  02/06/2016  CLINICAL DATA:  Chest pain, onset tonight after coughing. Hemoptysis. EXAM: PORTABLE CHEST 1 VIEW COMPARISON:  01/27/2016 FINDINGS: Mild patchy opacities in the bases. No large effusions. Unchanged mild cardiomegaly. No pneumothorax. IMPRESSION: Patchy basilar opacities bilaterally may represent alveolar edema, infectious infiltrate, atelectasis. Electronically Signed   By: Ellery Plunk M.D.   On: 02/06/2016 03:11   Dg Chest Port 1 View  01/27/2016  CLINICAL DATA:  Short of breath EXAM: PORTABLE CHEST 1 VIEW COMPARISON:  12/09/2015 FINDINGS: Upper normal heart size. Focal patchy density in the right mid lung zone persists. Small right pleural effusion has developed. No pneumothorax. Patchy bibasilar airspace opacities have improved. IMPRESSION: Patchy bibasilar airspace opacities have improved. Patchy right midlung opacity persists. Continued followup until complete resolution is recommended. If the abnormality fails to resolve, underlying nodule cannot be excluded. Electronically Signed   By: Jolaine Click M.D.   On: 01/27/2016 13:31   Dg Chest Port 1v Same Day  02/07/2016  CLINICAL DATA:  55 year old male with history of chest pain vision cocaine use. Persistent chest pain. EXAM: PORTABLE CHEST 1 VIEW COMPARISON:  Chest x-ray 02/06/2016. FINDINGS: Lung volumes are normal. No  consolidative airspace disease. No pleural effusion. No pneumothorax. No suspicious appearing pulmonary nodules or masses. Heart size is mildly enlarged (unchanged). Upper mediastinal contours are within normal limits. IMPRESSION: 1. No radiographic evidence of acute cardiopulmonary disease. 2. Mild cardiomegaly. Electronically Signed   By: Trudie Reed M.D.   On: 02/07/2016 13:21    Disposition   Pt is being discharged home today in good condition.  Follow-up Plans & Appointments    Follow-up Information    Follow up with Shamal Stracener, MD On 02/27/2016.   Specialty:  Cardiology   Why:  9:15AM. Cardiology followup   Contact information:   [address] [place] [address] [phone]       Schedule an appointment as soon as possible for a visit with Lora Paula, MD.   Specialty:  Family Medicine   Contact information:   [address] [place] [address] [phone]      Discharge Instructions    Diet - low sodium heart healthy    Complete by:  As directed      Increase activity slowly    Complete by:  As directed            Discharge Medications   Current Discharge Medication List    CONTINUE these medications which have CHANGED   Details  lisinopril (PRINIVIL,ZESTRIL) 10 MG tablet Take 1  tablet (10 mg total) by mouth daily. Qty: 30 tablet, Refills: 11      CONTINUE these medications which have NOT CHANGED   Details  albuterol (PROVENTIL HFA;VENTOLIN HFA) 108 (90 Base) MCG/ACT inhaler Inhale 2 puffs into the lungs every 6 (six) hours as needed for wheezing or shortness of breath. Qty: 1 Inhaler, Refills: 2    amiodarone (PACERONE) 200 MG tablet Take 1 tablet (200 mg total) by mouth daily. Qty: 30 tablet, Refills: 0    atorvastatin (LIPITOR) 80 MG tablet Take 1 tablet (80 mg total) by mouth every evening. Qty: 30 tablet, Refills: 3    Blood Glucose Monitoring Suppl (TRUE METRIX METER) DEVI 1 Device by Does not apply route 4  (four) times daily -  before meals and at bedtime. Qty: 1 Device, Refills: 0   Associated Diagnoses: Diabetes mellitus with insulin therapy (HCC)    clopidogrel (PLAVIX) 75 MG tablet Take 1 tablet (75 mg total) by mouth daily with breakfast. Qty: 30 tablet, Refills: 3    Dexlansoprazole (DEXILANT) 30 MG capsule Take 1 capsule (30 mg total) by mouth daily. Qty: 90 capsule, Refills: 3   Associated Diagnoses: Gastroesophageal reflux disease, esophagitis presence not specified    digoxin (LANOXIN) 0.125 MG tablet Take 0.125 mg by mouth daily.    diltiazem (DILACOR XR) 240 MG 24 hr capsule Take 240 mg by mouth daily.    Elastic Bandages & Supports (MEDICAL COMPRESSION STOCKINGS) MISC 20 mm Hg pressure Qty: 2 each, Refills: 3   Associated Diagnoses: Chronic systolic CHF (congestive heart failure) (HCC)    furosemide (LASIX) 40 MG tablet Take 2 tablets (80 mg total) by mouth 2 (two) times daily. Qty: 60 tablet, Refills: 3   Associated Diagnoses: Chronic systolic CHF (congestive heart failure) (HCC)    glucose blood (TRUE METRIX BLOOD GLUCOSE TEST) test strip Use as instructed Qty: 100 each, Refills: 12   Associated Diagnoses: Diabetes mellitus with insulin therapy (HCC)    Insulin Glargine (LANTUS SOLOSTAR) 100 UNIT/ML Solostar Pen Inject 20 Units into the skin daily at 10 pm. Qty: 15 mL, Refills: 0    ipratropium-albuterol (DUONEB) 0.5-2.5 (3) MG/3ML SOLN Take 3 mLs by nebulization 2 (two) times daily. Qty: 360 mL, Refills: 10    nitroGLYCERIN (NITROSTAT) 0.4 MG SL tablet Place 1 tablet (0.4 mg total) under the tongue every 5 (five) minutes as needed for chest pain (up to 3 doses). Qty: 25 tablet, Refills: 3    potassium chloride SA (K-DUR,KLOR-CON) 20 MEQ tablet Take 1 tablet (20 mEq total) by mouth 2 (two) times daily. Qty: 60 tablet, Refills: 3    rivaroxaban (XARELTO) 20 MG TABS tablet Take 1 tablet (20 mg total) by mouth daily with supper. Qty: 30 tablet, Refills: 3      TRUEPLUS LANCETS 28G MISC 1 each by Does not apply route 4 (four) times daily -  before meals and at bedtime. Qty: 100 each, Refills: 5   Associated Diagnoses: Diabetes mellitus with insulin therapy (HCC)    insulin aspart (NOVOLOG FLEXPEN) 100 UNIT/ML FlexPen Before each meal 3 times a day, 140-199 - 2 units, 200-250 - 4 units, 251-299 - 6 units,  300-349 - 8 units,  350 or above 10 units. Insulin PEN if approved, provide syringes and needles if needed. Qty: 15 mL, Refills: 0    isosorbide mononitrate (IMDUR) 30 MG 24 hr tablet Take 1 tablet (30 mg total) by mouth daily. Qty: 30 tablet, Refills: 2      STOP  taking these medications     amLODipine (NORVASC) 5 MG tablet      carvedilol (COREG) 12.5 MG tablet      hydrALAZINE (APRESOLINE) 25 MG tablet      spironolactone (ALDACTONE) 25 MG tablet            Outstanding Labs/Studies   None  Duration of Discharge Encounter   Greater than 30 minutes including physician time.  Signed, Azalee Course PA-C 02/10/2016, 12:33 PM

## 2016-02-10 NOTE — Progress Notes (Signed)
Patient Name: Jovonta Levit Date of Encounter: 02/10/2016  Principal Problem:   NSTEMI (non-ST elevated myocardial infarction) [facility]) Active Problems:   Acute on chronic combined systolic and diastolic congestive heart failure (HCC)   Cocaine abuse   Type 2 diabetes mellitus with hyperglycemia (HCC)   Atrial flutter (HCC)   Presence of drug coated stent in left circumflex coronary artery   Length of Stay: 4  SUBJECTIVE  No angina. Groin better. US shows lymphadenopathy, no pseudoaneurysm. Rapid improvement in renal function. Promises he will never use cocaine.  CURRENT MEDS . amiodarone  200 mg Oral Daily  . atorvastatin  80 mg Oral QPM  . clopidogrel  75 mg Oral Q breakfast  . digoxin  0.125 mg Oral Daily  . furosemide  80 mg Oral BID  . insulin aspart  2-10 Units Subcutaneous TID WC  . insulin glargine  30 Units Subcutaneous Q2200  . levofloxacin  750 mg Oral Daily  . lisinopril  10 mg Oral Daily  . mupirocin ointment   Nasal BID  . pantoprazole  40 mg Oral Daily  . rivaroxaban  15 mg Oral Q supper  . sodium chloride flush  3 mL Intravenous Q12H    OBJECTIVE   Intake/Output Summary (Last 24 hours) at 02/10/16 1155 Last data filed at 02/10/16 0730  Gross per 24 hour  Intake   1080 ml  Output      0 ml  Net   1080 ml   Filed Weights   02/06/16 0523 02/09/16 1901  Weight: 73.301 kg (161 lb 9.6 oz) 77.656 kg (171 lb 3.2 oz)    PHYSICAL EXAM Filed Vitals:   02/09/16 1901 02/09/16 2033 02/10/16 0524 02/10/16 1023  BP:  141/91 149/106 145/104  Pulse:  84 87 84  Temp:  98.7 F (37.1 C) 98.4 F (36.9 C) 98.4 F (36.9 C)  TempSrc:  Oral Oral Oral  Resp:  18 18 18   Height:      Weight: 77.656 kg (171 lb 3.2 oz)     SpO2:  100% 100% 100%   General: Alert, oriented x3, no distress Head: no evidence of trauma, PERRL, EOMI, no exophtalmos or lid lag, no myxedema, no xanthelasma; normal ears, nose and oropharynx Neck: normal jugular venous pulsations and no  hepatojugular reflux; brisk carotid pulses without delay and no carotid bruits Chest: clear to auscultation, no signs of consolidation by percussion or palpation, normal fremitus, symmetrical and full respiratory excursions Cardiovascular: normal position and quality of the apical impulse, regular rhythm, normal first and second heart sounds, no rubs, S3 present, 3/6 holosystolic apical murmur Abdomen: no tenderness or distention, no masses by palpation, no abnormal pulsatility or arterial bruits, normal bowel sounds, no hepatosplenomegaly Extremities: no clubbing, cyanosis or edema; 2+ radial, ulnar and brachial pulses bilaterally; 2+ right femoral, posterior tibial and dorsalis pedis pulses; 2+ left femoral, posterior tibial and dorsalis pedis pulses; no subclavian or femoral bruits Neurological: grossly nonfocal  LABS  CBC No results for input(s): WBC, NEUTROABS, HGB, HCT, MCV, PLT in the last 72 hours. Basic Metabolic Panel  Recent Labs  02/08/16 0258 02/09/16 0235 02/10/16 0337  NA 140 139 140  K 3.5 3.9 3.8  CL 105 104 106  CO2 26 26 25   GLUCOSE 134* 293* 207*  BUN 23* 21* 19  CREATININE 1.53* 1.85* 1.19  CALCIUM 8.4* 8.1* 8.5*  MG 1.7  --   --     Radiology Studies Imaging results have been reviewed and No results  found.  TELE NSR  ASSESSMENT AND PLAN  1. PAF with RVR - currently in NSR, on Xarelto  2. CAD/Elevated trop likely demand ischemia with rapid afib and cocaine abuse - cath 02/06/2016 100% known CTO of mid LAD, widely patent stent in RI (s/p stenting of RI in 1/17), 60% mid RCA, 80% 1st RPLB, 80% RPDA, 80% st OM1. No significant change from post PCI film in Jan - Echo 02/08/2016 EF 40-45%, severe hypokinesis of basal mid inferolateral and inferior myocardium, possible small 1.1cm x 0.6 cm flat apical thrombus (Xarelto should suffice). Severe MR. Moderate TR. PA peak pressure   3. Severe MR: will need valve  repair/replacement; will not refer unless he is completely abstinent from cocaine for a sustained period  4. Cough/fever/hemoptysis: I think he has had enough antibiotics  6. DM: on insulin, needs improved control  7. Cocaine abuse: I have told him that I plan to check UDS periodically  8. Acute kidney injury: resolved  DC home. TOC in < 2weeks.   [name], MD, [facility] [phone] office [phone] pager 02/10/2016 11:55 AM

## 2016-02-10 NOTE — Discharge Instructions (Signed)
No lifting over 5 lbs for 1 week. No sexual activity for 1 week. Keep procedure site clean & dry. If you notice increased pain, swelling, bleeding or pus, call/return!  You may shower, but no soaking baths/hot tubs/pools for 1 week °

## 2016-02-10 NOTE — Progress Notes (Signed)
Pt in stable condition , went over dc instructions with pt, pt verbalised understanding, iv taken out, tele dc ccmd notified, pt belongings at bedside, pt taken off the floor on a wheelchair

## 2016-02-10 NOTE — Progress Notes (Signed)
*  PRELIMINARY RESULTS* Vascular Ultrasound Right groin arterial evaluation limited has been completed.  Preliminary findings: No evidence of pseudoaneurysm or hematoma of the right groin. Multiple enlarged lymph nodes are visualized in this area.   Farrel Demark, RDMS, RVT  02/10/2016, 9:35 AM

## 2016-02-11 ENCOUNTER — Telehealth: Payer: Self-pay

## 2016-02-11 LAB — CULTURE, BLOOD (ROUTINE X 2)
CULTURE: NO GROWTH
CULTURE: NO GROWTH

## 2016-02-11 MED FILL — hydrALAZINE HCL 25 MG TABS: 25 | 30 days supply | Qty: 90 | Fill #0

## 2016-02-11 MED FILL — TRUE METRIX BLOOD GLUCOSE M: W/DEVICE | 1 days supply | Qty: 1 | Fill #0

## 2016-02-11 MED FILL — CLOPIDOGREL 75 MG TABLET: 75 | 30 days supply | Qty: 30 | Fill #0

## 2016-02-11 MED FILL — TRUEplus LANCETS 28G MISC: 25 days supply | Qty: 100 | Fill #0

## 2016-02-11 MED FILL — POTASSIUM CL ER 20 MEQ TAB: 20 | 30 days supply | Qty: 60 | Fill #0

## 2016-02-11 MED FILL — FUROSEMIDE 80 MG TABLET: 80 | 30 days supply | Qty: 60 | Fill #0

## 2016-02-11 MED FILL — TRUE METRIX TEST STRIP: 25 days supply | Qty: 100 | Fill #0

## 2016-02-11 NOTE — Telephone Encounter (Signed)
[facility] Post-discharge Follow-Up Phone Call:  Date of Discharge: 02/10/2016 Principal Discharge Diagnosis(es): NSTEMI, acute on chronic combined systolic and diastolic CHF, cocaine abuse, DM, atrial flutter, s/p left heart cath and coronary angiography Post-discharge Communication:call placed to the patient Call Completed: yes                    With Whom: Patient Interpreter Needed: No     Please check all that apply:  X  Patient is knowledgeable of his/her condition(s) and/or treatment. X  Patient is caring for self at home - He stated that his fiance, [name], helps him as needed. He said that he has been resting and is feeling " a little better."  He noted that his groin is still sore from the catheterization. No bleeding from the area reported.  ? Patient is receiving assist at home from family and/or caregiver. Family and/or caregiver is knowledgeable of patient's condition(s) and/or treatment. ? Patient is receiving home health services. If so, name of agency.     Medication Reconciliation:  X  Medication list reviewed with patient. - yes X Patient obtained all discharge medications. If not, why? He had not picked up the medications that had been filled for him prior to his hospitalization. This included his glucometer and testing supplies. He said that he has been using his fiance's glucometer. He stated that his sister, Alona Bene, will be coming to the [facility] pharmacy today to pick up all of his medications and his glucometer and she will make a payment on his account. He requested that this CM notify [name] and/or Milderd Meager  [facility] that Alona Bene will be picking up the medications for him and this CM notified [name] of his request. The discharge medication list was reviewed with him in detail and it was noted that he is only supposed to take those medications on the list. As per the physician discharge summary, he has a change to his lisinopril  strength and a  prescription was sent to [facility] pharmacy.  He is to stop taking amlodipine, coreg, hydralazine and spironolactone. He stated that he understood and he has the discharge list he received at the hospital. Instructed him regarding the importance of picking up all of his medications as the pharmacy and taking them as ordered. He said that he understood and again noted that Alona Bene will be picking them up today. As the list was reviewed he noted that he needed to call the pharmacy to order a few more refills and said he would do that so that Alona Bene could pick up all of the medications today  He confirmed that he has a nebulizer at home.    Activities of Daily Living:  X Independent ? Needs assist (describe; ? home DME used) ? Total Care (describe, ? home DME used)   Community resources in place for patient:  X None  ? Home Health/Home DME ? Assisted Living ? Support Group          Patient Education: Instructed him regarding the importance of picking up all of his medications at the pharmacy. Also discussed the importance of completing the application for the [facility]. He said that he needs to obtain a notarized letter of support for Fay Records, [facility] Financial Counselor, of the orange card application.  He was recently denied medicaid. He does receive $194/month of food stamps. He said that he has a scale and his weight today was holding steady at 171.8 lbs.  Questions/Concerns discussed: No other questions or problems reported at this time. He was agreeable to scheduling an appointment at the [facility] again and the call was put on hold to schedule an appointment. He said that he will have transportation to the clinic for his appointment.  `The call was then dropped after being put on hold.  An appointment was scheduled for 02/19/16 @ 1015.  This CM attempted to contact the patient again to notify him of the appointment date/time. Calls were placed to # [phone] (H) and # [phone] (M) and  HIPAA compliant voicemail messages were left on each number requesting a call back to # [phone] or [phone].

## 2016-02-12 ENCOUNTER — Telehealth: Payer: Self-pay

## 2016-02-12 NOTE — Telephone Encounter (Signed)
This Case Manager placed call to patient to inform him of [facility] appointment on 02/19/16 at 1015 with Dr. Venetia Night. This Case Manager also wanted to determine if patient picked up needed medications. Call placed to # [phone]; unable to reach patient. HIPPA compliant voicemail left requesting return call. In addition, call placed to #[phone]; unable to reach patient. Voicemail left requesting return call.

## 2016-02-16 ENCOUNTER — Telehealth: Payer: Self-pay

## 2016-02-16 NOTE — Telephone Encounter (Signed)
Attempted to contact the patient to check on his status and to remind him of his appointment at [facility] on 02/19/16 @ 1015. Calls placed to # [phone] (H) [phone] (M) and HIPAA compliant voice mail messages were left on each number requesting a call back to # [phone] or [phone].

## 2016-02-17 ENCOUNTER — Telehealth: Payer: Self-pay

## 2016-02-17 NOTE — Telephone Encounter (Signed)
Attempted to contact the patient to notify him of his appointment at the [facility], [facility] on 02/19/16@ 1015. Calls placed to # [phone] (H) [phone] (M) and HIPAA compliant messages were left on each number requesting a call back to # [phone] or [phone].

## 2016-02-18 ENCOUNTER — Telehealth: Payer: Self-pay

## 2016-02-18 MED FILL — !VENTOLIN HFA INHALER: 108 (90 BAS | 25 days supply | Qty: 18 | Fill #2

## 2016-02-18 NOTE — Telephone Encounter (Signed)
This Case Manager placed call to patient to determine if patient picked up his discharge medications and to inform him of his upcoming appointment on 02/19/16 at 1015 with Dr. Venetia Night. Patient indicated he had been having "phone problems" which is why we had been able to reach him. Patient indicated he would be at his appointment and confirmed he had transportation to his appointment. Inquired if patient had picked up all of his discharge medications from 01/29/16 and 02/10/16. Patient indicated he has picked up all his medications except Novolog. Patient indicated he had script for Novolog at home. Discussed importance of taking script to pharmacy today to get medication filled. Patient verbalized understanding. Patient indicated he had been checking his blood glucose, and they are typically around 150. Discussed importance of keeping a blood glucose log and bringing log in for Dr. Venetia Night to review. Patient verbalized understanding. No additional needs/concerns identified.

## 2016-02-19 ENCOUNTER — Telehealth: Payer: Self-pay | Admitting: Cardiovascular Disease

## 2016-02-19 ENCOUNTER — Inpatient Hospital Stay: Payer: Self-pay | Admitting: Family Medicine

## 2016-02-19 NOTE — Telephone Encounter (Signed)
02-19-16 Lvm to call and schedule echo /saf

## 2016-02-20 ENCOUNTER — Telehealth: Payer: Self-pay

## 2016-02-20 NOTE — Telephone Encounter (Signed)
This Case Manager placed call to patient to check status and to discuss rescheduling appointment with Dr. Venetia Night as patient missed appointment on 02/19/16. Call placed to #[phone]; unable to reach patient. HIPPA compliant voicemail left requesting return call. In addition, call placed to #[phone]; unable to reach patient. An additional HIPPA compliant voicemail left requesting return call.

## 2016-02-23 ENCOUNTER — Telehealth: Payer: Self-pay | Admitting: Cardiovascular Disease

## 2016-02-23 DIAGNOSIS — F1411 Cocaine abuse, in remission: Secondary | ICD-10-CM

## 2016-02-23 DIAGNOSIS — Z79899 Other long term (current) drug therapy: Secondary | ICD-10-CM

## 2016-02-23 NOTE — Telephone Encounter (Signed)
Returned call. While pt was in hospital, states he had conversation w Dr. Royann Shivers about having urine sample done "at any time". Routed to Dr. Royann Shivers for clarification.

## 2016-02-23 NOTE — Telephone Encounter (Signed)
Pt's sister calling to speak to the nurse regarding checking a urine sample-per Dr C at the hospital per sister-pls call

## 2016-02-23 NOTE — Telephone Encounter (Signed)
He needs a BMET and urine drug screen before his next appointment.

## 2016-02-23 NOTE — Telephone Encounter (Signed)
Orders entered, informed caller to present for labwork this week. Understanding verbalized.

## 2016-02-27 ENCOUNTER — Telehealth: Payer: Self-pay

## 2016-02-27 ENCOUNTER — Ambulatory Visit: Payer: Self-pay | Admitting: Cardiovascular Disease

## 2016-02-27 NOTE — Telephone Encounter (Signed)
This Case Manager placed call to patient to discuss rescheduling appointment with Dr. Venetia Night as patient missed appointment on 02/19/16. Call placed to #[phone]; unable to reach patient. HIPPA compliant voicemail left requesting return call. Also placed call to #[phone]. An additional HIPPA compliant voicemail left requesting return call.

## 2016-02-29 ENCOUNTER — Ambulatory Visit (HOSPITAL_BASED_OUTPATIENT_CLINIC_OR_DEPARTMENT_OTHER): Payer: Self-pay | Attending: Nurse Practitioner | Admitting: Cardiology

## 2016-02-29 VITALS — Ht 70.0 in | Wt 180.0 lb

## 2016-02-29 DIAGNOSIS — I25701 Atherosclerosis of coronary artery bypass graft(s), unspecified, with angina pectoris with documented spasm: Secondary | ICD-10-CM | POA: Insufficient documentation

## 2016-02-29 DIAGNOSIS — I509 Heart failure, unspecified: Secondary | ICD-10-CM | POA: Insufficient documentation

## 2016-02-29 DIAGNOSIS — I110 Hypertensive heart disease with heart failure: Secondary | ICD-10-CM | POA: Insufficient documentation

## 2016-02-29 DIAGNOSIS — E119 Type 2 diabetes mellitus without complications: Secondary | ICD-10-CM | POA: Insufficient documentation

## 2016-02-29 DIAGNOSIS — R5383 Other fatigue: Secondary | ICD-10-CM | POA: Insufficient documentation

## 2016-02-29 DIAGNOSIS — R0683 Snoring: Secondary | ICD-10-CM | POA: Insufficient documentation

## 2016-02-29 DIAGNOSIS — R51 Headache: Secondary | ICD-10-CM | POA: Insufficient documentation

## 2016-02-29 DIAGNOSIS — G4733 Obstructive sleep apnea (adult) (pediatric): Secondary | ICD-10-CM | POA: Insufficient documentation

## 2016-02-29 DIAGNOSIS — G4719 Other hypersomnia: Secondary | ICD-10-CM | POA: Insufficient documentation

## 2016-03-03 ENCOUNTER — Ambulatory Visit: Payer: Self-pay | Admitting: Cardiovascular Disease

## 2016-03-06 ENCOUNTER — Telehealth: Payer: Self-pay | Admitting: Cardiology

## 2016-03-06 ENCOUNTER — Encounter (HOSPITAL_BASED_OUTPATIENT_CLINIC_OR_DEPARTMENT_OTHER): Payer: Self-pay | Admitting: Cardiology

## 2016-03-06 DIAGNOSIS — G4719 Other hypersomnia: Secondary | ICD-10-CM

## 2016-03-06 HISTORY — DX: Other hypersomnia: G47.19

## 2016-03-06 NOTE — Telephone Encounter (Signed)
Please let patient know that since he could only sleep 6 minutes during the sleep study that it will need to be repeated with a sleep aide.  Please set up repeat split night study with Lunesta 3mg  to take when he gets to the sleep lab.

## 2016-03-06 NOTE — Procedures (Signed)
   Patient Name: Sean Hinton, Sean Hinton MRN: [id number] Study Date: 02/29/2016 Gender: Male D.O.B: Apr 11, 1961 Age (years): 10 Referring Provider: Norma Fredrickson Interpreting Physician: Armanda Magic MD, ABSM RPSGT: Cherylann Parr  Weight (lbs): 180 BMI: 26 Height (inches): 70 Neck Size: 14.00  CLINICAL INFORMATION Sleep Study Type: NPSG Indication for sleep study: Congestive Heart Failure, Diabetes, Fatigue, Hypertension, Morning Headaches, Snoring  SLEEP STUDY TECHNIQUE As per the AASM Manual for the Scoring of Sleep and Associated Events v2.3 (April 2016) with a hypopnea requiring 4% desaturations. The channels recorded and monitored were frontal, central and occipital EEG, electrooculogram (EOG), submentalis EMG (chin), nasal and oral airflow, thoracic and abdominal wall motion, anterior tibialis EMG, snore microphone, electrocardiogram, and pulse oximetry.  MEDICATIONS Patient's medications: Reviewed in the chart.  Medications self-administered by patient during sleep study : No sleep medicine administered.  SLEEP ARCHITECTURE The study was initiated at 9:50:30 PM and ended at 12:26:18 AM. Sleep onset time was 44.8 minutes and the sleep efficiency was 4.2%. The total sleep time was 6.5 minutes. Stage REM latency was N/A minutes. The patient spent 84.62% of the night in stage N1 sleep, 15.38% in stage N2 sleep, 0.00% in stage N3 and 0.00% in REM. Alpha intrusion was absent. Supine sleep was 46.15%.  RESPIRATORY PARAMETERS The overall apnea/hypopnea index (AHI) was 36.9 per hour. There were 1 total apneas, including 1 obstructive, 0 central and 0 mixed apneas. There were 3 hypopneas and 0 RERAs. The AHI during Stage REM sleep was N/A per hour. AHI while supine was 60.0 per hour. The mean oxygen saturation was 89.15%. The minimum SpO2 during sleep was 85.00%. Loud snoring was noted during this study.  CARDIAC DATA The 2 lead EKG demonstrated sinus rhythm. The mean heart rate was  3.60 beats per minute. Other EKG findings include: None.  LEG MOVEMENT DATA The total PLMS were 0 with a resulting PLMS index of 0.00. Associated arousal with leg movement index was 0.0 .  IMPRESSIONS - Inadequate time to get accurate study to diagnose OSA. - No significant central sleep apnea occurred during this study (CAI = 0.0/h). - Mild oxygen desaturation was noted during this study (Min O2 = 85.00%). - The patient snored with Loud snoring volume. - No cardiac abnormalities were noted during this study. - Clinically significant periodic limb movements did not occur during sleep. No significant associated arousals.  DIAGNOSIS - Obstructive Sleep Apnea (327.23 [G47.33 ICD-10])  RECOMMENDATIONS - As patient did not have adequate sleep time to get an accurate assessment for OSA, recommend repeat split night study with sleep aide  - Positional therapy avoiding supine position during sleep. - Avoid alcohol, sedatives and other CNS depressants that may worsen sleep apnea and disrupt normal sleep architecture. - Sleep hygiene should be reviewed to assess factors that may improve sleep quality. - Weight management and regular exercise should be initiated or continued if appropriate.      Armanda Magic Diplomate, American Board of Sleep Medicine  ELECTRONICALLY SIGNED ON:  03/06/2016, 10:22 PM [facility] PH: [phone]   FX: [phone] ACCREDITED BY THE AMERICAN ACADEMY OF SLEEP MEDICINE

## 2016-03-11 NOTE — Telephone Encounter (Signed)
Left message for patient to call back  

## 2016-03-19 ENCOUNTER — Encounter: Payer: Self-pay | Admitting: Cardiovascular Disease

## 2016-03-19 ENCOUNTER — Ambulatory Visit (INDEPENDENT_AMBULATORY_CARE_PROVIDER_SITE_OTHER): Payer: Self-pay | Admitting: Cardiovascular Disease

## 2016-03-19 VITALS — BP 118/80 | HR 101 | Ht 70.0 in | Wt 165.4 lb

## 2016-03-19 DIAGNOSIS — I5022 Chronic systolic (congestive) heart failure: Secondary | ICD-10-CM

## 2016-03-19 DIAGNOSIS — I4891 Unspecified atrial fibrillation: Secondary | ICD-10-CM

## 2016-03-19 DIAGNOSIS — I2510 Atherosclerotic heart disease of native coronary artery without angina pectoris: Secondary | ICD-10-CM

## 2016-03-19 DIAGNOSIS — I10 Essential (primary) hypertension: Secondary | ICD-10-CM

## 2016-03-19 DIAGNOSIS — E785 Hyperlipidemia, unspecified: Secondary | ICD-10-CM

## 2016-03-19 DIAGNOSIS — E1159 Type 2 diabetes mellitus with other circulatory complications: Secondary | ICD-10-CM

## 2016-03-19 DIAGNOSIS — I4892 Unspecified atrial flutter: Secondary | ICD-10-CM

## 2016-03-19 DIAGNOSIS — F1410 Cocaine abuse, uncomplicated: Secondary | ICD-10-CM

## 2016-03-19 DIAGNOSIS — I340 Nonrheumatic mitral (valve) insufficiency: Secondary | ICD-10-CM

## 2016-03-19 MED ORDER — ALBUTEROL SULFATE HFA 108 (90 BASE) MCG/ACT IN AERS: 2.0000 | INHALATION_SPRAY | Freq: Four times a day (QID) | RESPIRATORY_TRACT | Status: AC | PRN

## 2016-03-19 MED FILL — VENTOLIN HFA 90 MCG INHALER: 108 (90 BAS | 30 days supply | Qty: 18 | Fill #0

## 2016-03-19 NOTE — Progress Notes (Signed)
ROS   Signed, Patient ID: Sean Hinton, male   DOB: 04-19-1961, 55 y.o.   MRN: [id number]     Cardiology Office Note    Date:  03/23/2016   ID:  Sean Hinton, DOB May 20, 1961, MRN [id number]  PCP:  Sean Paula, MD  Cardiologist:   Sean Fair, MD   Chief Complaint  Patient presents with  . Follow-up    2 weeks  pt c/o SOB on exertion, has a hard time breathing outside; swelling in legs/feet/ankles when he sits down    History of Present Illness:  Sean Hinton is a 55 y.o. male with premature coronary artery disease, ischemic cardiomyopathy, history of paroxysmal atrial fibrillation and previous syncope related to paroxysmal atrial flutter with one-to-one AV conduction, severe hypertension, hyperlipidemia, type 2 diabetes mellitus, cocaine abuse, tobacco abuse returns for follow-up.  He denies angina pectoris but has exertional dyspnea NYHA functional class II. For example he becomes short of breath Sean Hinton a trash can up the hill to the driveway. He denies orthopnea, dizziness, palpitations and syncope. He has mild ankle edema towards the end of the day.  His most recent hospitalization was April 14-18 with an acute non-ST segment elevation myocardial infarction. The coronary angiograms did not show any significant change from the post PCI films in January  - hospitalized on January 12 for acute myocardial infarction due to thrombotic occlusion of the ramus intermedius artery in the setting of cocaine abuse.  - hospitalized again on January 25-29 with acute heart failure exacerbation after again using cocaine, requiring brief intubation and mechanical ventilation, UDS was positive for cocaine and barbiturates. Had atrial fibrillation with rapid ventricular response requiring amiodarone drip followed by oral amiodarone. Again his beta blockers had to be stopped and he was placed on amlodipine. Had only a minor increase in cardiac troponin this time. EF by echo was 40-45 %.  Continues to have severe mitral regurgitation.Transient acute kidney injury resolved by hospital discharge. Hemoglobin A1c was 9.9%.  - February 5-6th had hypoxic respiratory failure, chest x-ray showed bilateral pulmonary edema and he required BiPAP. He improved and was discharged from the hospital on February 6. Again, his urine drug screen was positive for cocaine  - urine drug screens positive on December 20, January 9, January 25, February 5, April 4, April 14. He has not had a clean drug screen in the last 5 months.  Today he reports feeling well. He appears calm and reasonable. He remains in denial about the fact that he is a cocaine addict and will not admit that he has recently used cocaine. He continues to steadily lose weight. He has lost about 40 pounds in the last year. I'm afraid that his weight loss is secondary to his drug habits rather than healthy diet and exercise.   Past Medical History  Diagnosis Date  . Diabetes mellitus with complication (HCC)   . Hypertension   . Syncope     a. 02/2015 - felt 2/2 rapid AF/AFL.  Marland Kitchen Atrial fibrillation and flutter (HCC)     a. Dx 02/2015 - placed on Xarelto; b. 09/2015 recurrence in setting of PNA.  . Ischemic cardiomyopathy     a. 09/2015 Echo: EF 35-40%.  Marland Kitchen CAD (coronary artery disease)     a. Dx 02/2015 - 2V CAD with CTO LAD, diffuse dz in PDA, OM - med rx.  b. cath 01/2016 no change in anatomy, medical therapy  . Hypertensive heart disease   . Hyperlipidemia   . Mitral  regurgitation     a. Mod by echo 02/2015,  . Tobacco abuse   . Chronic systolic CHF (congestive heart failure) (HCC)     a. 09/2015 Echo: EF 35-40%, sev apical/antlat, apical HK, mild MR, mildly dil LA.  Marland Kitchen ARF (acute respiratory failure) (HCC)   . Pneumonia 09/2015  . GERD (gastroesophageal reflux disease)   . Headache   . Arthritis     ra  . NSTEMI (non-ST elevated myocardial infarction) (HCC) 10/2015  . Excessive daytime sleepiness 03/06/2016    Past Surgical  History  Procedure Laterality Date  . Right thumb surgery     . Cardiac catheterization N/A 02/24/2015    Procedure: Left Heart Cath and Coronary Angiography;  Surgeon: Sean Lex, MD;  Location: [facility] INVASIVE CV LAB CUPID;  Service: Cardiovascular;  Laterality: N/A;  . Cardiac catheterization  02/24/2015    Procedure: Coronary/Bypass Graft CTO Intervention;  Surgeon: Sean Lex, MD;  Location: [facility] INVASIVE CV LAB CUPID;  Service: Cardiovascular;;  . Cardiac catheterization N/A 11/03/2015    Procedure: Left Heart Cath and Coronary Angiography;  Surgeon: Sean Bollman, MD;  Location: [facility] INVASIVE CV LAB;  Service: Cardiovascular;  Laterality: N/A;  . Cardiac catheterization N/A 11/03/2015    Procedure: Coronary Stent Intervention;  Surgeon: Sean Bollman, MD;  Location: [facility] INVASIVE CV LAB;  Service: Cardiovascular;  Laterality: N/A;  RAMUS  . Cardiac catheterization N/A 02/06/2016    Procedure: Left Heart Cath and Coronary Angiography;  Surgeon: Sean Lex, MD;  Location: [facility] INVASIVE CV LAB;  Service: Cardiovascular;  Laterality: N/A;    Outpatient Prescriptions Prior to Visit  Medication Sig Dispense Refill  . amiodarone (PACERONE) 200 MG tablet Take 1 tablet (200 mg total) by mouth daily. 30 tablet 0  . atorvastatin (LIPITOR) 80 MG tablet Take 1 tablet (80 mg total) by mouth every evening. 30 tablet 3  . Blood Glucose Monitoring Suppl (TRUE METRIX METER) DEVI 1 Device by Does not apply route 4 (four) times daily -  before meals and at bedtime. 1 Device 0  . clopidogrel (PLAVIX) 75 MG tablet Take 1 tablet (75 mg total) by mouth daily with breakfast. 30 tablet 3  . Dexlansoprazole (DEXILANT) 30 MG capsule Take 1 capsule (30 mg total) by mouth daily. 90 capsule 3  . digoxin (LANOXIN) 0.125 MG tablet Take 0.125 mg by mouth daily.    Marland Kitchen diltiazem (DILACOR XR) 240 MG 24 hr capsule Take 240 mg by mouth daily.    Clinical research associate Bandages & Supports (MEDICAL COMPRESSION STOCKINGS) MISC 20 mm Hg pressure  2 each 3  . glucose blood (TRUE METRIX BLOOD GLUCOSE TEST) test strip Use as instructed 100 each 12  . insulin aspart (NOVOLOG FLEXPEN) 100 UNIT/ML FlexPen Before each meal 3 times a day, 140-199 - 2 units, 200-250 - 4 units, 251-299 - 6 units,  300-349 - 8 units,  350 or above 10 units. Insulin PEN if approved, provide syringes and needles if needed. 15 mL 0  . Insulin Glargine (LANTUS SOLOSTAR) 100 UNIT/ML Solostar Pen Inject 20 Units into the skin daily at 10 pm. 15 mL 0  . ipratropium-albuterol (DUONEB) 0.5-2.5 (3) MG/3ML SOLN Take 3 mLs by nebulization 2 (two) times daily. 360 mL 10  . isosorbide mononitrate (IMDUR) 30 MG 24 hr tablet Take 1 tablet (30 mg total) by mouth daily. 30 tablet 2  . lisinopril (PRINIVIL,ZESTRIL) 10 MG tablet Take 1 tablet (10 mg total) by mouth daily. 30 tablet 11  .  nitroGLYCERIN (NITROSTAT) 0.4 MG SL tablet Place 1 tablet (0.4 mg total) under the tongue every 5 (five) minutes as needed for chest pain (up to 3 doses). 25 tablet 3  . potassium chloride SA (K-DUR,KLOR-CON) 20 MEQ tablet Take 1 tablet (20 mEq total) by mouth 2 (two) times daily. 60 tablet 3  . rivaroxaban (XARELTO) 20 MG TABS tablet Take 1 tablet (20 mg total) by mouth daily with supper. 30 tablet 3  . TRUEPLUS LANCETS 28G MISC 1 each by Does not apply route 4 (four) times daily -  before meals and at bedtime. 100 each 5  . albuterol (PROVENTIL HFA;VENTOLIN HFA) 108 (90 Base) MCG/ACT inhaler Inhale 2 puffs into the lungs every 6 (six) hours as needed for wheezing or shortness of breath. 1 Inhaler 2  . furosemide (LASIX) 40 MG tablet Take 2 tablets (80 mg total) by mouth 2 (two) times daily. 60 tablet 3   No facility-administered medications prior to visit.     Allergies:   Review of patient's allergies indicates no known allergies.   Social History   Social History  . Marital Status: Single    Spouse Name: N/A  . Number of Children: N/A  . Years of Education: N/A   Social History Main Topics    . Smoking status: Current Some Day Smoker -- 0.10 packs/day for 30 years    Types: Cigarettes  . Smokeless tobacco: Never Used  . Alcohol Use: No  . Drug Use: Yes    Special: Cocaine     Comment: "last use 1991", Positive UDS 10/2015./last cocaine use on 10/19/2015  . Sexual Activity: Not Asked   Other Topics Concern  . None   Social History Narrative     Family History:  The patient's family history includes Cancer in his mother and sister; Diabetes in his brother, father, mother, and sister; Heart attack in his brother, father, and mother; Heart disease in his brother, father, and mother; Hypertension in his brother, father, mother, and sister; Stroke in his brother.   ROS:   Please see the history of present illness.    ROS All other systems reviewed and are negative.   PHYSICAL EXAM:   VS:  BP 118/80 mmHg  Pulse 101  Ht 5\' 10"  (1.778 m)  Wt 75.025 kg (165 lb 6.4 oz)  BMI 23.73 kg/m2   GEN: Well nourished, well developed, in no acute distress HEENT: normal Neck: no JVD, carotid bruits, or masses Cardiac: RRR; 3/6 apical holosystolic murmur apical heave, no diastolic murmurs, rubs, or gallops,no edema  Respiratory:  clear to auscultation bilaterally, normal work of breathing GI: soft, nontender, nondistended, + BS MS: no deformity or atrophy Skin: warm and dry, no rash Neuro:  Alert and Oriented x 3, Strength and sensation are intact Psych: euthymic mood, full affect  Wt Readings from Last 3 Encounters:  03/19/16 75.025 kg (165 lb 6.4 oz)  02/29/16 81.647 kg (180 lb)  02/09/16 77.656 kg (171 lb 3.2 oz)      Studies/Labs Reviewed:   EKG:  EKG is ordered today.  The ekg ordered today demonstrates sinus rhythm, right atrial enlargement, QS pattern in leads V1-V3, no acute ST segment changes  Recent Labs: 01/06/2016: Pro B Natriuretic peptide (BNP) 1491.0* 01/26/2016: TSH 1.554 01/28/2016: ALT 55 02/06/2016: B Natriuretic Peptide 1224.6* 02/07/2016: Hemoglobin 11.6*;  Platelets 127* 02/08/2016: Magnesium 1.7 02/10/2016: BUN 19; Creatinine, Ser 1.19; Potassium 3.8; Sodium 140   Lipid Panel    Component Value Date/Time  CHOL 169 02/06/2016 0601   TRIG 102 02/06/2016 0601   HDL 58 02/06/2016 0601   CHOLHDL 2.9 02/06/2016 0601   VLDL 20 02/06/2016 0601   LDLCALC 91 02/06/2016 0601    Additional studies/ records that were reviewed today include:  Multiple records related to his last hospitalization    ASSESSMENT:    1. Chronic systolic CHF (congestive heart failure) (HCC)   2. Mitral regurgitation   3. CAD in native artery   4. Atrial fibrillation and flutter (HCC)   5. Severe essential hypertension   6. Cocaine abuse   7. Type 2 diabetes mellitus with vascular disease (HCC)   8. PAF (paroxysmal atrial fibrillation) (HCC)   9. Hyperlipidemia      PLAN:  In order of problems listed above:  1. CHF: As far as I can tell he is close to euvolemic status. He has repeatedly presented with acute pulmonary edema. He has had cocaine in his drug screen on each occasion. This is occurring on the background of moderate ischemic cardiomyopathy and severe mitral insufficiency. It is hard to say how compliant he is with his medications but he reports that he is taking ever prescribed medication. Beta blockers had to be stopped again since he is repeatedly using cocaine. He is not on ACE inhibitor as but is taking hydralazine/nitrates. 2. MR: Not a surgical candidate until he proves sobriety. Even then it is not clear that his valvular problem is amenable to repair since LVEF is already low and the mechanism is likely ischemic. 3. CAD: Known chronic total occlusion of the LAD artery with mostly apical scar, recent acute MI related to thrombotic occlusion of the ramus intermedius treated with a drug-eluting stent, diffusely diseased right coronary artery. He denies angina pectoris at this time. He reports compliance with clopidogrel. It is been a month since his  stent placement and will stop the aspirin to reduce bleeding risk. He will continue taking clopidogrel and Xarelto. 4. PAF: Had recurrent exacerbations with rapid ventricular response in the setting of cocaine abuse and/or heart failure exacerbation. More than a year ago had an episode of syncope related to atrial flutter with rapid ventricular response. Secondary to cocaine use, beta blockers are not used. He is on diltiazem which has deleterious effects on left ventricular systolic performance. He is now on amiodarone 200 mg daily. We'll probably stop diltiazem soon since at this point amiodarone may have reached sufficient levels to provide sufficient rate control. Very high embolic risk. Continue Xarelto 5. HTN: His blood pressure is well controlled and he is requiring fewer medications than lower doses for control. This is likely related to substantial weight loss. 6. Cocaine abuse: His prognosis is very poor. He refuses to admit that he uses cocaine and does not realize that he is addicted. He is very intelligent and is a smooth talker and gets his way out of the debate by changing to alternatives topics. I think the odds are high that he will die unless he stops using cocaine. I have told him this in no uncertain terms on more than one occasion. He has a good system of support with a steady girlfriend and a caring large family. I think if he acknowledges the problem and confronts it he has a reasonably good chance of overcoming it. If he doesn't, it is certain that the combination of serious cardiac problems and cocaine use will lead to a fatal outcome. I recommended a drug screen today and will recommend it at  each subsequent office visit. 7. DM: Poorly controlled, recheck hemoglobin A1c with his lipid profile in a month 8. HLP:  High-dose statin. Will be due a repeat lipid profile in a month    Medication Adjustments/Labs and Tests Ordered: Current medicines are reviewed at length with the patient  today.  Concerns regarding medicines are outlined above.  Medication changes, Labs and Tests ordered today are listed in the Patient Instructions below. Patient Instructions  Medication Instructions: Dr Royann Shivers recommends that you continue on your current medications as directed. Please refer to the Current Medication list given to you today.  Labwork: Your physician recommends that you return for lab work at your earliest convenience, either Saturday or Tuesday.  Testing/Procedures: NONE ORDERED  Follow-up: Dr Royann Shivers recommends that you schedule a follow-up appointment in 2 weeks with Theodore Demark, PA.  If you need a refill on your cardiac medications before your next appointment, please call your pharmacy.   Your physician has requested that you regularly monitor and record your blood pressure readings at home. Please use the same machine at the same time of day to check your readings and record them to bring to your follow-up visit.  Your physician recommends that you weigh, daily, at the same time every day, and in the same amount of clothing. Please record your daily weights on the handout provided and bring it to your next appointment.  Please use the log provided and bring it with you to your next appointment.       Signed, Sean Fair, MD  03/23/2016 3:21 PM    [facility] [address] [place], [place], [place]  [address] Phone: [phone]; Fax: [phone]    Sean Fair, MD  03/23/2016 3:21 PM    [facility] [address] [place], [place], [place]  [address] Phone: [phone]; Fax: [phone]

## 2016-03-19 NOTE — Patient Instructions (Signed)
Medication Instructions: Dr Royann Shivers recommends that you continue on your current medications as directed. Please refer to the Current Medication list given to you today.  Labwork: Your physician recommends that you return for lab work at your earliest convenience, either Saturday or Tuesday.  Testing/Procedures: NONE ORDERED  Follow-up: Dr Royann Shivers recommends that you schedule a follow-up appointment in 2 weeks with Theodore Demark, PA.  If you need a refill on your cardiac medications before your next appointment, please call your pharmacy.   Your physician has requested that you regularly monitor and record your blood pressure readings at home. Please use the same machine at the same time of day to check your readings and record them to bring to your follow-up visit.  Your physician recommends that you weigh, daily, at the same time every day, and in the same amount of clothing. Please record your daily weights on the handout provided and bring it to your next appointment.  Please use the log provided and bring it with you to your next appointment.

## 2016-03-23 ENCOUNTER — Encounter: Payer: Self-pay | Admitting: Cardiovascular Disease

## 2016-03-23 ENCOUNTER — Ambulatory Visit: Payer: Self-pay | Admitting: Pulmonary Disease

## 2016-03-25 NOTE — Telephone Encounter (Signed)
Left message for patient to call back for results.  

## 2016-03-31 DIAGNOSIS — Z7901 Long term (current) use of anticoagulants: Secondary | ICD-10-CM | POA: Insufficient documentation

## 2016-03-31 NOTE — Progress Notes (Signed)
Cardiology Office Note:    Date:  03/31/2016   ID:  Sean Hinton, DOB 31-Dec-1960, MRN [id number]  PCP:  Lora Paula, MD  Cardiologist:  Dr. Rachelle Hora Croitoru   Electrophysiologist:  n/a  Referring MD: Dessa Phi, MD   Chief Complaint  Patient presents with  . Congestive Heart Failure    follow up from 03/19/16    History of Present Illness:     Sean Hinton is a 55 y.o. male with a hx of ICM w/ EF 35-40%, systolic HF, severe MR, PAF on Xarelto, HTN, HLD, DM, cocaine hx, tobacco use, CAD w/ CTO LAD, DES RI 10/2015.     Past Medical History  Diagnosis Date  . Diabetes mellitus with complication (HCC)   . Hypertension   . Syncope     a. 02/2015 - felt 2/2 rapid AF/AFL.  Marland Kitchen Atrial fibrillation and flutter (HCC)     a. Dx 02/2015 - placed on Xarelto; b. 09/2015 recurrence in setting of PNA.  . Ischemic cardiomyopathy     a. 09/2015 Echo: EF 35-40%.  Marland Kitchen CAD (coronary artery disease)     a. Dx 02/2015 - 2V CAD with CTO LAD, diffuse dz in PDA, OM - med rx.  b. cath 01/2016 no change in anatomy, medical therapy  . Hypertensive heart disease   . Hyperlipidemia   . Mitral regurgitation     a. Mod by echo 02/2015,  . Tobacco abuse   . Chronic systolic CHF (congestive heart failure) (HCC)     a. 09/2015 Echo: EF 35-40%, sev apical/antlat, apical HK, mild MR, mildly dil LA.  Marland Kitchen ARF (acute respiratory failure) (HCC)   . Pneumonia 09/2015  . GERD (gastroesophageal reflux disease)   . Headache   . Arthritis     ra  . NSTEMI (non-ST elevated myocardial infarction) (HCC) 10/2015  . Excessive daytime sleepiness 03/06/2016    Past Surgical History  Procedure Laterality Date  . Right thumb surgery     . Cardiac catheterization N/A 02/24/2015    Procedure: Left Heart Cath and Coronary Angiography;  Surgeon: Marykay Lex, MD;  Location: [facility] INVASIVE CV LAB CUPID;  Service: Cardiovascular;  Laterality: N/A;  . Cardiac catheterization  02/24/2015    Procedure: Coronary/Bypass Graft  CTO Intervention;  Surgeon: Marykay Lex, MD;  Location: [facility] INVASIVE CV LAB CUPID;  Service: Cardiovascular;;  . Cardiac catheterization N/A 11/03/2015    Procedure: Left Heart Cath and Coronary Angiography;  Surgeon: Tonny Bollman, MD;  Location: [facility] INVASIVE CV LAB;  Service: Cardiovascular;  Laterality: N/A;  . Cardiac catheterization N/A 11/03/2015    Procedure: Coronary Stent Intervention;  Surgeon: Tonny Bollman, MD;  Location: [facility] INVASIVE CV LAB;  Service: Cardiovascular;  Laterality: N/A;  RAMUS  . Cardiac catheterization N/A 02/06/2016    Procedure: Left Heart Cath and Coronary Angiography;  Surgeon: Marykay Lex, MD;  Location: [facility] INVASIVE CV LAB;  Service: Cardiovascular;  Laterality: N/A;    Current Medications: Outpatient Prescriptions Prior to Visit  Medication Sig Dispense Refill  . albuterol (PROVENTIL HFA;VENTOLIN HFA) 108 (90 Base) MCG/ACT inhaler Inhale 2 puffs into the lungs every 6 (six) hours as needed for wheezing or shortness of breath. 1 Inhaler 3  . amiodarone (PACERONE) 200 MG tablet Take 1 tablet (200 mg total) by mouth daily. 30 tablet 0  . atorvastatin (LIPITOR) 80 MG tablet Take 1 tablet (80 mg total) by mouth every evening. 30 tablet 3  . Blood Glucose Monitoring Suppl (TRUE METRIX METER)  DEVI 1 Device by Does not apply route 4 (four) times daily -  before meals and at bedtime. 1 Device 0  . clopidogrel (PLAVIX) 75 MG tablet Take 1 tablet (75 mg total) by mouth daily with breakfast. 30 tablet 3  . Dexlansoprazole (DEXILANT) 30 MG capsule Take 1 capsule (30 mg total) by mouth daily. 90 capsule 3  . digoxin (LANOXIN) 0.125 MG tablet Take 0.125 mg by mouth daily.    Marland Kitchen diltiazem (DILACOR XR) 240 MG 24 hr capsule Take 240 mg by mouth daily.    Clinical research associate Bandages & Supports (MEDICAL COMPRESSION STOCKINGS) MISC 20 mm Hg pressure 2 each 3  . furosemide (LASIX) 80 MG tablet Take 1 tablet by mouth 2 (two) times daily.  3  . glucose blood (TRUE METRIX BLOOD GLUCOSE TEST)  test strip Use as instructed 100 each 12  . insulin aspart (NOVOLOG FLEXPEN) 100 UNIT/ML FlexPen Before each meal 3 times a day, 140-199 - 2 units, 200-250 - 4 units, 251-299 - 6 units,  300-349 - 8 units,  350 or above 10 units. Insulin PEN if approved, provide syringes and needles if needed. 15 mL 0  . Insulin Glargine (LANTUS SOLOSTAR) 100 UNIT/ML Solostar Pen Inject 20 Units into the skin daily at 10 pm. 15 mL 0  . ipratropium-albuterol (DUONEB) 0.5-2.5 (3) MG/3ML SOLN Take 3 mLs by nebulization 2 (two) times daily. 360 mL 10  . isosorbide mononitrate (IMDUR) 30 MG 24 hr tablet Take 1 tablet (30 mg total) by mouth daily. 30 tablet 2  . lisinopril (PRINIVIL,ZESTRIL) 10 MG tablet Take 1 tablet (10 mg total) by mouth daily. 30 tablet 11  . nitroGLYCERIN (NITROSTAT) 0.4 MG SL tablet Place 1 tablet (0.4 mg total) under the tongue every 5 (five) minutes as needed for chest pain (up to 3 doses). 25 tablet 3  . potassium chloride SA (K-DUR,KLOR-CON) 20 MEQ tablet Take 1 tablet (20 mEq total) by mouth 2 (two) times daily. 60 tablet 3  . rivaroxaban (XARELTO) 20 MG TABS tablet Take 1 tablet (20 mg total) by mouth daily with supper. 30 tablet 3  . TRUEPLUS LANCETS 28G MISC 1 each by Does not apply route 4 (four) times daily -  before meals and at bedtime. 100 each 5   No facility-administered medications prior to visit.      Allergies:   Review of patient's allergies indicates no known allergies.   Social History   Social History  . Marital Status: Single    Spouse Name: N/A  . Number of Children: N/A  . Years of Education: N/A   Social History Main Topics  . Smoking status: Current Some Day Smoker -- 0.10 packs/day for 30 years    Types: Cigarettes  . Smokeless tobacco: Never Used  . Alcohol Use: No  . Drug Use: Yes    Special: Cocaine     Comment: "last use 1991", Positive UDS 10/2015./last cocaine use on 10/19/2015  . Sexual Activity: Not on file   Other Topics Concern  . Not on file     Social History Narrative     Family History:  The patient's family history includes Cancer in his mother and sister; Diabetes in his brother, father, mother, and sister; Heart attack in his brother, father, and mother; Heart disease in his brother, father, and mother; Hypertension in his brother, father, mother, and sister; Stroke in his brother.   ROS:   Please see the history of present illness.    ROS  All other systems reviewed and are negative.   Physical Exam:    VS:  There were no vitals taken for this visit.   Physical Exam  Wt Readings from Last 3 Encounters:  03/19/16 165 lb 6.4 oz (75.025 kg)  02/29/16 180 lb (81.647 kg)  02/09/16 171 lb 3.2 oz (77.656 kg)      Studies/Labs Reviewed:     EKG:  EKG is  ordered today.  The ekg ordered today demonstrates   Recent Labs: 01/06/2016: Pro B Natriuretic peptide (BNP) 1491.0* 01/26/2016: TSH 1.554 01/28/2016: ALT 55 02/06/2016: B Natriuretic Peptide 1224.6* 02/07/2016: Hemoglobin 11.6*; Platelets 127* 02/08/2016: Magnesium 1.7 02/10/2016: BUN 19; Creatinine, Ser 1.19; Potassium 3.8; Sodium 140   Recent Lipid Panel    Component Value Date/Time   CHOL 169 02/06/2016 0601   TRIG 102 02/06/2016 0601   HDL 58 02/06/2016 0601   CHOLHDL 2.9 02/06/2016 0601   VLDL 20 02/06/2016 0601   LDLCALC 91 02/06/2016 0601    Additional studies/ records that were reviewed today include:    LE Art Duplex 02/10/16 No evidence of pseudoaneurysm or hematoma of the right groin. Multiple enlarged lymph nodes are visualized in this area.  Echo 02/08/16 - Left ventricle: The cavity size was normal. Wall thickness was  normal. Systolic function was mildly to moderately reduced. The  estimated ejection fraction was in the range of 40% to 45%.  Severe hypokinesis of the basal-midinferolateral and inferior  myocardium. Akinesis of the apical myocardium. Moderate  hypokinesis of the mid-apicalanteroseptal and anterior  myocardium. Doppler  parameters are consistent with restrictive  physiology, indicative of decreased left ventricular diastolic  compliance and/or increased left atrial pressure. There was a  possible, small, 1.1 cm (L) x 0.6 cm (W), flat (mural),  calcified, fixed, apicalthrombus. - Mitral valve: There was severe regurgitation directed  eccentrically and posteriorly. Severe regurgitation is suggested  by pulmonary vein systolic flow reversal. - Left atrium: The atrium was moderately to severely dilated. - Right ventricle: The cavity size was mildly dilated. - Right atrium: The atrium was moderately to severely dilated. - Tricuspid valve: There was moderate regurgitation. - Pulmonary arteries: Systolic pressure was moderately increased.  PA peak pressure: 54 mm Hg (S).  LHC 02/06/16 LAD mid 100% RI stent patent LCx small with OM1 80% RCA prox 60%, mid 30%, dist 65%, RPDA 60%, 80%, RPAVB 50%, RPLB1 80% EF 35-45% Angiographically no significant change from post-PCI films in January. As diffuse disease throughout the entire right coronary and branches that would require extensive stent work. With his questionable medication adherence, I would not recommend multiple stents. Diagnostic Diagram               ASSESSMENT:     1. Chronic systolic CHF (congestive heart failure) (HCC)   2. Cardiomyopathy, ischemic   3. Coronary artery disease involving native coronary artery of native heart with other form of angina pectoris (HCC)   4. PAF (paroxysmal atrial fibrillation) (HCC)   5. Mitral regurgitation   6. Essential hypertension   7. Hyperlipidemia   8. Chronic anticoagulation     PLAN:     In order of problems listed above:  1.    Medication Adjustments/Labs and Tests Ordered: Current medicines are reviewed at length with the patient today.  Concerns regarding medicines are outlined above.  Medication changes, Labs and Tests ordered today are outlined in the Patient Instructions noted  below. There are no Patient Instructions on file for this visit. Signed, Lorin Picket  [place] Dec  03/31/2016 10:08 PM    [facility] [address] [place], [place], [place]  [address] Phone: [phone]; Fax: [phone]     This encounter was created in error - please disregard.

## 2016-04-01 ENCOUNTER — Encounter: Payer: Self-pay | Admitting: Physician Assistant

## 2016-04-02 ENCOUNTER — Ambulatory Visit: Payer: Self-pay | Admitting: Pulmonary Disease

## 2016-04-05 ENCOUNTER — Encounter: Payer: Self-pay | Admitting: *Deleted

## 2016-04-05 NOTE — Telephone Encounter (Signed)
Left message for patient to call back.     Since this is the 3rd time I have left a message, I will send a letter out to have him call our office to discuss.

## 2016-04-14 ENCOUNTER — Emergency Department (HOSPITAL_COMMUNITY): Payer: Medicaid Other

## 2016-04-14 ENCOUNTER — Encounter (HOSPITAL_COMMUNITY): Payer: Self-pay | Admitting: Radiology

## 2016-04-14 ENCOUNTER — Encounter (HOSPITAL_COMMUNITY): Admission: EM | Disposition: E | Payer: Self-pay | Source: Home / Self Care | Attending: Neurology

## 2016-04-14 ENCOUNTER — Inpatient Hospital Stay (HOSPITAL_COMMUNITY): Payer: Medicaid Other

## 2016-04-14 ENCOUNTER — Emergency Department (HOSPITAL_COMMUNITY): Payer: Medicaid Other | Admitting: Certified Registered"

## 2016-04-14 ENCOUNTER — Inpatient Hospital Stay (HOSPITAL_COMMUNITY)
Admission: EM | Admit: 2016-04-14 | Discharge: 2016-04-24 | DRG: 023 | Disposition: E | Payer: Medicaid Other | Attending: Neurology | Admitting: Neurology

## 2016-04-14 ENCOUNTER — Telehealth: Payer: Self-pay | Admitting: Cardiovascular Disease

## 2016-04-14 ENCOUNTER — Other Ambulatory Visit: Payer: Self-pay

## 2016-04-14 DIAGNOSIS — E1159 Type 2 diabetes mellitus with other circulatory complications: Secondary | ICD-10-CM

## 2016-04-14 DIAGNOSIS — E870 Hyperosmolality and hypernatremia: Secondary | ICD-10-CM | POA: Diagnosis present

## 2016-04-14 DIAGNOSIS — J9601 Acute respiratory failure with hypoxia: Secondary | ICD-10-CM | POA: Diagnosis present

## 2016-04-14 DIAGNOSIS — Z9119 Patient's noncompliance with other medical treatment and regimen: Secondary | ICD-10-CM

## 2016-04-14 DIAGNOSIS — I5022 Chronic systolic (congestive) heart failure: Secondary | ICD-10-CM | POA: Diagnosis present

## 2016-04-14 DIAGNOSIS — G936 Cerebral edema: Secondary | ICD-10-CM | POA: Diagnosis present

## 2016-04-14 DIAGNOSIS — I25709 Atherosclerosis of coronary artery bypass graft(s), unspecified, with unspecified angina pectoris: Secondary | ICD-10-CM

## 2016-04-14 DIAGNOSIS — E232 Diabetes insipidus: Secondary | ICD-10-CM | POA: Diagnosis present

## 2016-04-14 DIAGNOSIS — I6389 Other cerebral infarction: Secondary | ICD-10-CM

## 2016-04-14 DIAGNOSIS — R578 Other shock: Secondary | ICD-10-CM | POA: Diagnosis not present

## 2016-04-14 DIAGNOSIS — Z951 Presence of aortocoronary bypass graft: Secondary | ICD-10-CM

## 2016-04-14 DIAGNOSIS — I10 Essential (primary) hypertension: Secondary | ICD-10-CM

## 2016-04-14 DIAGNOSIS — K920 Hematemesis: Secondary | ICD-10-CM | POA: Diagnosis not present

## 2016-04-14 DIAGNOSIS — N182 Chronic kidney disease, stage 2 (mild): Secondary | ICD-10-CM | POA: Diagnosis present

## 2016-04-14 DIAGNOSIS — E876 Hypokalemia: Secondary | ICD-10-CM | POA: Diagnosis present

## 2016-04-14 DIAGNOSIS — G4733 Obstructive sleep apnea (adult) (pediatric): Secondary | ICD-10-CM | POA: Diagnosis present

## 2016-04-14 DIAGNOSIS — Z66 Do not resuscitate: Secondary | ICD-10-CM | POA: Diagnosis not present

## 2016-04-14 DIAGNOSIS — I480 Paroxysmal atrial fibrillation: Secondary | ICD-10-CM

## 2016-04-14 DIAGNOSIS — I161 Hypertensive emergency: Secondary | ICD-10-CM | POA: Diagnosis present

## 2016-04-14 DIAGNOSIS — R29810 Facial weakness: Secondary | ICD-10-CM | POA: Diagnosis present

## 2016-04-14 DIAGNOSIS — I4892 Unspecified atrial flutter: Secondary | ICD-10-CM | POA: Diagnosis present

## 2016-04-14 DIAGNOSIS — Y929 Unspecified place or not applicable: Secondary | ICD-10-CM | POA: Diagnosis not present

## 2016-04-14 DIAGNOSIS — R414 Neurologic neglect syndrome: Secondary | ICD-10-CM | POA: Diagnosis present

## 2016-04-14 DIAGNOSIS — G8194 Hemiplegia, unspecified affecting left nondominant side: Secondary | ICD-10-CM | POA: Diagnosis present

## 2016-04-14 DIAGNOSIS — I639 Cerebral infarction, unspecified: Secondary | ICD-10-CM

## 2016-04-14 DIAGNOSIS — E1151 Type 2 diabetes mellitus with diabetic peripheral angiopathy without gangrene: Secondary | ICD-10-CM | POA: Diagnosis present

## 2016-04-14 DIAGNOSIS — I63231 Cerebral infarction due to unspecified occlusion or stenosis of right carotid arteries: Secondary | ICD-10-CM | POA: Insufficient documentation

## 2016-04-14 DIAGNOSIS — E1122 Type 2 diabetes mellitus with diabetic chronic kidney disease: Secondary | ICD-10-CM | POA: Diagnosis present

## 2016-04-14 DIAGNOSIS — W1839XA Other fall on same level, initial encounter: Secondary | ICD-10-CM | POA: Diagnosis present

## 2016-04-14 DIAGNOSIS — E1165 Type 2 diabetes mellitus with hyperglycemia: Secondary | ICD-10-CM | POA: Diagnosis present

## 2016-04-14 DIAGNOSIS — Z9289 Personal history of other medical treatment: Secondary | ICD-10-CM

## 2016-04-14 DIAGNOSIS — G9340 Encephalopathy, unspecified: Secondary | ICD-10-CM

## 2016-04-14 DIAGNOSIS — Y9389 Activity, other specified: Secondary | ICD-10-CM | POA: Diagnosis not present

## 2016-04-14 DIAGNOSIS — Z823 Family history of stroke: Secondary | ICD-10-CM

## 2016-04-14 DIAGNOSIS — R0602 Shortness of breath: Secondary | ICD-10-CM | POA: Diagnosis present

## 2016-04-14 DIAGNOSIS — I130 Hypertensive heart and chronic kidney disease with heart failure and stage 1 through stage 4 chronic kidney disease, or unspecified chronic kidney disease: Secondary | ICD-10-CM | POA: Diagnosis present

## 2016-04-14 DIAGNOSIS — K219 Gastro-esophageal reflux disease without esophagitis: Secondary | ICD-10-CM | POA: Diagnosis present

## 2016-04-14 DIAGNOSIS — Z7901 Long term (current) use of anticoagulants: Secondary | ICD-10-CM | POA: Diagnosis not present

## 2016-04-14 DIAGNOSIS — F419 Anxiety disorder, unspecified: Secondary | ICD-10-CM | POA: Diagnosis present

## 2016-04-14 DIAGNOSIS — Z833 Family history of diabetes mellitus: Secondary | ICD-10-CM

## 2016-04-14 DIAGNOSIS — E785 Hyperlipidemia, unspecified: Secondary | ICD-10-CM

## 2016-04-14 DIAGNOSIS — I340 Nonrheumatic mitral (valve) insufficiency: Secondary | ICD-10-CM | POA: Diagnosis present

## 2016-04-14 DIAGNOSIS — I63411 Cerebral infarction due to embolism of right middle cerebral artery: Principal | ICD-10-CM | POA: Diagnosis present

## 2016-04-14 DIAGNOSIS — Z79899 Other long term (current) drug therapy: Secondary | ICD-10-CM

## 2016-04-14 DIAGNOSIS — Z794 Long term (current) use of insulin: Secondary | ICD-10-CM

## 2016-04-14 DIAGNOSIS — I255 Ischemic cardiomyopathy: Secondary | ICD-10-CM | POA: Diagnosis present

## 2016-04-14 DIAGNOSIS — R29721 NIHSS score 21: Secondary | ICD-10-CM | POA: Diagnosis present

## 2016-04-14 DIAGNOSIS — I4891 Unspecified atrial fibrillation: Secondary | ICD-10-CM | POA: Diagnosis present

## 2016-04-14 DIAGNOSIS — Z955 Presence of coronary angioplasty implant and graft: Secondary | ICD-10-CM

## 2016-04-14 DIAGNOSIS — Z8249 Family history of ischemic heart disease and other diseases of the circulatory system: Secondary | ICD-10-CM

## 2016-04-14 DIAGNOSIS — G935 Compression of brain: Secondary | ICD-10-CM | POA: Diagnosis present

## 2016-04-14 DIAGNOSIS — Q251 Coarctation of aorta: Secondary | ICD-10-CM

## 2016-04-14 DIAGNOSIS — F17200 Nicotine dependence, unspecified, uncomplicated: Secondary | ICD-10-CM

## 2016-04-14 DIAGNOSIS — E878 Other disorders of electrolyte and fluid balance, not elsewhere classified: Secondary | ICD-10-CM | POA: Diagnosis present

## 2016-04-14 DIAGNOSIS — I160 Hypertensive urgency: Secondary | ICD-10-CM

## 2016-04-14 DIAGNOSIS — Z7902 Long term (current) use of antithrombotics/antiplatelets: Secondary | ICD-10-CM

## 2016-04-14 DIAGNOSIS — F17210 Nicotine dependence, cigarettes, uncomplicated: Secondary | ICD-10-CM | POA: Diagnosis present

## 2016-04-14 DIAGNOSIS — G9382 Brain death: Secondary | ICD-10-CM | POA: Insufficient documentation

## 2016-04-14 DIAGNOSIS — I2510 Atherosclerotic heart disease of native coronary artery without angina pectoris: Secondary | ICD-10-CM | POA: Diagnosis present

## 2016-04-14 DIAGNOSIS — I6789 Other cerebrovascular disease: Secondary | ICD-10-CM

## 2016-04-14 DIAGNOSIS — J9600 Acute respiratory failure, unspecified whether with hypoxia or hypercapnia: Secondary | ICD-10-CM

## 2016-04-14 DIAGNOSIS — I252 Old myocardial infarction: Secondary | ICD-10-CM

## 2016-04-14 DIAGNOSIS — F1410 Cocaine abuse, uncomplicated: Secondary | ICD-10-CM

## 2016-04-14 DIAGNOSIS — I6521 Occlusion and stenosis of right carotid artery: Secondary | ICD-10-CM | POA: Diagnosis present

## 2016-04-14 DIAGNOSIS — R739 Hyperglycemia, unspecified: Secondary | ICD-10-CM

## 2016-04-14 HISTORY — PX: RADIOLOGY WITH ANESTHESIA: SHX6223

## 2016-04-14 LAB — I-STAT CHEM 8, ED
BUN: 20 mg/dL (ref 6–20)
CHLORIDE: 98 mmol/L — AB (ref 101–111)
CREATININE: 1.5 mg/dL — AB (ref 0.61–1.24)
Calcium, Ion: 1.07 mmol/L — ABNORMAL LOW (ref 1.12–1.23)
GLUCOSE: 664 mg/dL — AB (ref 65–99)
HCT: 45 % (ref 39.0–52.0)
HEMOGLOBIN: 15.3 g/dL (ref 13.0–17.0)
POTASSIUM: 3.8 mmol/L (ref 3.5–5.1)
Sodium: 133 mmol/L — ABNORMAL LOW (ref 135–145)
TCO2: 22 mmol/L (ref 0–100)

## 2016-04-14 LAB — CBG MONITORING, ED
GLUCOSE-CAPILLARY: 506 mg/dL — AB (ref 65–99)
GLUCOSE-CAPILLARY: 343 mg/dL — AB (ref 65–99)
Glucose-Capillary: 582 mg/dL (ref 65–99)
Glucose-Capillary: >600 mg/dL (ref 65–99)

## 2016-04-14 LAB — DIFFERENTIAL
BASOS PCT: 0 %
Basophils Absolute: 0 10*3/uL (ref 0.0–0.1)
EOS ABS: 0 10*3/uL (ref 0.0–0.7)
EOS PCT: 1 %
LYMPHS PCT: 23 %
Lymphs Abs: 1.9 10*3/uL (ref 0.7–4.0)
MONO ABS: 0.6 10*3/uL (ref 0.1–1.0)
Monocytes Relative: 7 %
NEUTROS PCT: 69 %
Neutro Abs: 5.9 10*3/uL (ref 1.7–7.7)

## 2016-04-14 LAB — CBC
HCT: 40.1 % (ref 39.0–52.0)
Hemoglobin: 13.1 g/dL (ref 13.0–17.0)
MCH: 27.2 pg (ref 26.0–34.0)
MCHC: 32.7 g/dL (ref 30.0–36.0)
MCV: 83.2 fL (ref 78.0–100.0)
PLATELETS: 164 10*3/uL (ref 150–400)
RBC: 4.82 MIL/uL (ref 4.22–5.81)
RDW: 14.8 % (ref 11.5–15.5)
WBC: 8.4 10*3/uL (ref 4.0–10.5)

## 2016-04-14 LAB — BASIC METABOLIC PANEL
ANION GAP: 5 (ref 5–15)
ANION GAP: 5 (ref 5–15)
Anion gap: 2 — ABNORMAL LOW (ref 5–15)
BUN: 12 mg/dL (ref 6–20)
BUN: 9 mg/dL (ref 6–20)
BUN: 9 mg/dL (ref 6–20)
CALCIUM: 7.8 mg/dL — AB (ref 8.9–10.3)
CALCIUM: 8.6 mg/dL — AB (ref 8.9–10.3)
CALCIUM: 8.7 mg/dL — AB (ref 8.9–10.3)
CHLORIDE: 124 mmol/L — AB (ref 101–111)
CO2: 22 mmol/L (ref 22–32)
CO2: 24 mmol/L (ref 22–32)
CO2: 25 mmol/L (ref 22–32)
CREATININE: 1.04 mg/dL (ref 0.61–1.24)
CREATININE: 1.08 mg/dL (ref 0.61–1.24)
Chloride: 109 mmol/L (ref 101–111)
Chloride: 118 mmol/L — ABNORMAL HIGH (ref 101–111)
Creatinine, Ser: 1.01 mg/dL (ref 0.61–1.24)
GFR calc Af Amer: >60 mL/min (ref >60)
GFR calc non Af Amer: >60 mL/min (ref >60)
GFR calc non Af Amer: >60 mL/min (ref >60)
GLUCOSE: 258 mg/dL — AB (ref 65–99)
GLUCOSE: 180 mg/dL — AB (ref 65–99)
GLUCOSE: 214 mg/dL — AB (ref 65–99)
POTASSIUM: 3.9 mmol/L (ref 3.5–5.1)
Potassium: 3 mmol/L — ABNORMAL LOW (ref 3.5–5.1)
Potassium: 3.8 mmol/L (ref 3.5–5.1)
SODIUM: 147 mmol/L — AB (ref 135–145)
Sodium: 136 mmol/L (ref 135–145)
Sodium: 151 mmol/L — ABNORMAL HIGH (ref 135–145)

## 2016-04-14 LAB — CBC WITH DIFFERENTIAL/PLATELET
Basophils Absolute: 0 10*3/uL (ref 0.0–0.1)
Basophils Relative: 0 %
Eosinophils Absolute: 0 10*3/uL (ref 0.0–0.7)
Eosinophils Relative: 0 %
HEMATOCRIT: 38.4 % — AB (ref 39.0–52.0)
HEMOGLOBIN: 12.6 g/dL — AB (ref 13.0–17.0)
LYMPHS ABS: 1.7 10*3/uL (ref 0.7–4.0)
LYMPHS PCT: 17 %
MCH: 27.1 pg (ref 26.0–34.0)
MCHC: 32.8 g/dL (ref 30.0–36.0)
MCV: 82.6 fL (ref 78.0–100.0)
MONOS PCT: 9 %
Monocytes Absolute: 0.9 10*3/uL (ref 0.1–1.0)
NEUTROS ABS: 7.5 10*3/uL (ref 1.7–7.7)
NEUTROS PCT: 74 %
Platelets: 149 10*3/uL — ABNORMAL LOW (ref 150–400)
RBC: 4.65 MIL/uL (ref 4.22–5.81)
RDW: 14.8 % (ref 11.5–15.5)
WBC: 10.2 10*3/uL (ref 4.0–10.5)

## 2016-04-14 LAB — COMPREHENSIVE METABOLIC PANEL
ALBUMIN: 2.8 g/dL — AB (ref 3.5–5.0)
ALK PHOS: 198 U/L — AB (ref 38–126)
ALT: 38 U/L (ref 17–63)
AST: 32 U/L (ref 15–41)
Anion gap: 9 (ref 5–15)
BUN: 17 mg/dL (ref 6–20)
CALCIUM: 8.6 mg/dL — AB (ref 8.9–10.3)
CO2: 23 mmol/L (ref 22–32)
CREATININE: 1.72 mg/dL — AB (ref 0.61–1.24)
Chloride: 98 mmol/L — ABNORMAL LOW (ref 101–111)
GFR, EST AFRICAN AMERICAN: 51 mL/min — AB (ref >60)
GFR, EST NON AFRICAN AMERICAN: 44 mL/min — AB (ref >60)
Glucose, Bld: 724 mg/dL (ref 65–99)
Potassium: 3.8 mmol/L (ref 3.5–5.1)
SODIUM: 130 mmol/L — AB (ref 135–145)
TOTAL PROTEIN: 5.9 g/dL — AB (ref 6.5–8.1)
Total Bilirubin: 1.4 mg/dL — ABNORMAL HIGH (ref 0.3–1.2)

## 2016-04-14 LAB — ECHOCARDIOGRAM COMPLETE
E decel time: 119 msec
EERAT: 11.08
FS: 11 % — AB (ref 28–44)
HEIGHTINCHES: 69 in
IVS/LV PW RATIO, ED: 1
LA ID, A-P, ES: 47 mm
LA diam end sys: 47 mm
LADIAMINDEX: 2.36 cm/m2
LAVOLA4C: 90.9 mL
LV E/e'average: 11.08
LV TDI E'LATERAL: 12
LVEEMED: 11.08
LVELAT: 12 cm/s
MRPISAEROA: 0.33 cm2
MV Dec: 119
MV Peak grad: 7 mmHg
MV VTI: 120 cm
MV pk A vel: 36.5 m/s
MVPKEVEL: 133 m/s
PW: 9.35 mm — AB (ref 0.6–1.1)
RV TAPSE: 15.8 mm
Reg peak vel: 305 cm/s
TDI e' medial: 7.78
TR max vel: 305 cm/s
WEIGHTICAEL: 2913.6 [oz_av]

## 2016-04-14 LAB — URINALYSIS, ROUTINE W REFLEX MICROSCOPIC
BILIRUBIN URINE: NEGATIVE
Bilirubin Urine: NEGATIVE
Glucose, UA: >1000 mg/dL — AB
HGB URINE DIPSTICK: NEGATIVE
KETONES UR: NEGATIVE mg/dL
KETONES UR: NEGATIVE mg/dL
LEUKOCYTES UA: NEGATIVE
Leukocytes, UA: NEGATIVE
NITRITE: NEGATIVE
NITRITE: NEGATIVE
PROTEIN: NEGATIVE mg/dL
Protein, ur: NEGATIVE mg/dL
Specific Gravity, Urine: <1.005 — ABNORMAL LOW (ref 1.005–1.030)
Specific Gravity, Urine: 1.035 — ABNORMAL HIGH (ref 1.005–1.030)
pH: 5.5 (ref 5.0–8.0)
pH: 5.5 (ref 5.0–8.0)

## 2016-04-14 LAB — RAPID URINE DRUG SCREEN, HOSP PERFORMED
AMPHETAMINES: NOT DETECTED
BENZODIAZEPINES: NOT DETECTED
Barbiturates: NOT DETECTED
COCAINE: POSITIVE — AB
OPIATES: NOT DETECTED
Tetrahydrocannabinol: NOT DETECTED

## 2016-04-14 LAB — URINE MICROSCOPIC-ADD ON

## 2016-04-14 LAB — OSMOLALITY: Osmolality: 320 mOsm/kg — ABNORMAL HIGH (ref 275–295)

## 2016-04-14 LAB — GLUCOSE, CAPILLARY
GLUCOSE-CAPILLARY: 300 mg/dL — AB (ref 65–99)
GLUCOSE-CAPILLARY: 291 mg/dL — AB (ref 65–99)
GLUCOSE-CAPILLARY: 280 mg/dL — AB (ref 65–99)
GLUCOSE-CAPILLARY: 223 mg/dL — AB (ref 65–99)
GLUCOSE-CAPILLARY: 133 mg/dL — AB (ref 65–99)
GLUCOSE-CAPILLARY: 129 mg/dL — AB (ref 65–99)
GLUCOSE-CAPILLARY: 106 mg/dL — AB (ref 65–99)
GLUCOSE-CAPILLARY: 112 mg/dL — AB (ref 65–99)
GLUCOSE-CAPILLARY: 140 mg/dL — AB (ref 65–99)
GLUCOSE-CAPILLARY: 209 mg/dL — AB (ref 65–99)
Glucose-Capillary: 174 mg/dL — ABNORMAL HIGH (ref 65–99)
Glucose-Capillary: 172 mg/dL — ABNORMAL HIGH (ref 65–99)

## 2016-04-14 LAB — PLATELET INHIBITION P2Y12: Platelet Function  P2Y12: 287 [PRU] (ref 194–418)

## 2016-04-14 LAB — MRSA PCR SCREENING: MRSA by PCR: NEGATIVE

## 2016-04-14 LAB — PROTIME-INR
INR: 1.45 (ref 0.00–1.49)
PROTHROMBIN TIME: 17.7 s — AB (ref 11.6–15.2)

## 2016-04-14 LAB — I-STAT TROPONIN, ED: Troponin i, poc: 0.03 ng/mL (ref 0.00–0.08)

## 2016-04-14 LAB — BETA-HYDROXYBUTYRIC ACID: BETA-HYDROXYBUTYRIC ACID: 0.1 mmol/L (ref 0.05–0.27)

## 2016-04-14 LAB — OSMOLALITY, URINE: Osmolality, Ur: 64 mOsm/kg — ABNORMAL LOW (ref 300–900)

## 2016-04-14 LAB — TRIGLYCERIDES: TRIGLYCERIDES: 71 mg/dL (ref <150)

## 2016-04-14 LAB — HEPARIN LEVEL (UNFRACTIONATED): Heparin Unfractionated: <0.1 IU/mL — ABNORMAL LOW (ref 0.30–0.70)

## 2016-04-14 LAB — APTT: aPTT: 30 seconds (ref 24–37)

## 2016-04-14 SURGERY — RADIOLOGY WITH ANESTHESIA
Anesthesia: Monitor Anesthesia Care

## 2016-04-14 MED ORDER — PROPOFOL 1000 MG/100ML IV EMUL
0.0000 ug/kg/min | INTRAVENOUS | Status: DC
  Administered 2016-04-14 (×3): 50 ug/kg/min via INTRAVENOUS
  Administered 2016-04-14: 40 ug/kg/min via INTRAVENOUS
  Administered 2016-04-15: 20 ug/kg/min via INTRAVENOUS
  Administered 2016-04-15: 30 ug/kg/min via INTRAVENOUS
  Filled 2016-04-14 (×5): qty 100

## 2016-04-14 MED ORDER — ATORVASTATIN CALCIUM 80 MG PO TABS
80.0000 mg | ORAL_TABLET | Freq: Every evening | ORAL | Status: DC
  Administered 2016-04-14 – 2016-04-17 (×4): 80 mg via ORAL
  Filled 2016-04-14 (×4): qty 1

## 2016-04-14 MED ORDER — VITAL AF 1.2 CAL PO LIQD
1000.0000 mL | ORAL | Status: DC
  Administered 2016-04-14 – 2016-04-15 (×3): 1000 mL

## 2016-04-14 MED ORDER — ACETAMINOPHEN 500 MG PO TABS: 1000.0000 mg | ORAL_TABLET | Freq: Four times a day (QID) | ORAL | Status: DC | PRN

## 2016-04-14 MED ORDER — IOPAMIDOL (ISOVUE-370) INJECTION 76%
INTRAVENOUS | Status: AC
  Administered 2016-04-14: 50 mL
  Filled 2016-04-14: qty 50

## 2016-04-14 MED ORDER — IOPAMIDOL (ISOVUE-300) INJECTION 61%
INTRAVENOUS | Status: AC
  Administered 2016-04-14: 75 mL
  Filled 2016-04-14: qty 150

## 2016-04-14 MED ORDER — FENTANYL CITRATE (PF) 250 MCG/5ML IJ SOLN
INTRAMUSCULAR | Status: AC
  Filled 2016-04-14: qty 5

## 2016-04-14 MED ORDER — INSULIN ASPART 100 UNIT/ML ~~LOC~~ SOLN
10.0000 [IU] | Freq: Once | SUBCUTANEOUS | Status: AC
  Administered 2016-04-14: 10 [IU] via INTRAVENOUS

## 2016-04-14 MED ORDER — PHENYLEPHRINE HCL 10 MG/ML IJ SOLN
10.0000 mg | INTRAVENOUS | Status: DC | PRN
  Administered 2016-04-14: 50 ug/min via INTRAVENOUS

## 2016-04-14 MED ORDER — POTASSIUM CHLORIDE 20 MEQ/15ML (10%) PO SOLN
40.0000 meq | Freq: Once | ORAL | Status: AC
  Administered 2016-04-14: 40 meq
  Filled 2016-04-14: qty 30

## 2016-04-14 MED ORDER — FENTANYL CITRATE (PF) 100 MCG/2ML IJ SOLN
INTRAMUSCULAR | Status: DC | PRN
  Administered 2016-04-14: 100 ug via INTRAVENOUS

## 2016-04-14 MED ORDER — ETOMIDATE 2 MG/ML IV SOLN
INTRAVENOUS | Status: DC | PRN
  Administered 2016-04-14: 20 mg via INTRAVENOUS

## 2016-04-14 MED ORDER — DEXTROSE-NACL 5-0.9 % IV SOLN
INTRAVENOUS | Status: DC
  Administered 2016-04-14 – 2016-04-15 (×2): via INTRAVENOUS

## 2016-04-14 MED ORDER — ONDANSETRON HCL 4 MG/2ML IJ SOLN: 4.0000 mg | Freq: Four times a day (QID) | INTRAMUSCULAR | Status: DC | PRN

## 2016-04-14 MED ORDER — ACETAMINOPHEN 325 MG PO TABS: 650.0000 mg | ORAL_TABLET | ORAL | Status: DC | PRN

## 2016-04-14 MED ORDER — ACETAMINOPHEN 650 MG RE SUPP: 650.0000 mg | Freq: Four times a day (QID) | RECTAL | Status: DC | PRN

## 2016-04-14 MED ORDER — LIDOCAINE HCL 1 % IJ SOLN
INTRAMUSCULAR | Status: AC
  Filled 2016-04-14: qty 20

## 2016-04-14 MED ORDER — NICARDIPINE HCL IN NACL 20-0.86 MG/200ML-% IV SOLN
5.0000 mg/h | INTRAVENOUS | Status: DC
  Administered 2016-04-14 (×3): 5 mg/h via INTRAVENOUS
  Administered 2016-04-15: 4 mg/h via INTRAVENOUS
  Filled 2016-04-14 (×3): qty 200

## 2016-04-14 MED ORDER — BISACODYL 10 MG RE SUPP: 10.0000 mg | Freq: Every day | RECTAL | Status: DC | PRN

## 2016-04-14 MED ORDER — SODIUM CHLORIDE 0.9 % IV SOLN
INTRAVENOUS | Status: DC
  Administered 2016-04-14: 20:00:00 via INTRAVENOUS

## 2016-04-14 MED ORDER — LIDOCAINE 2% (20 MG/ML) 5 ML SYRINGE
INTRAMUSCULAR | Status: AC
  Filled 2016-04-14: qty 5

## 2016-04-14 MED ORDER — SODIUM CHLORIDE 0.9 % IV SOLN
INTRAVENOUS | Status: DC
  Administered 2016-04-14: 5.4 [IU]/h via INTRAVENOUS
  Filled 2016-04-14: qty 2.5

## 2016-04-14 MED ORDER — SODIUM CHLORIDE 0.9 % IV SOLN: INTRAVENOUS | Status: DC

## 2016-04-14 MED ORDER — ONDANSETRON HCL 4 MG/2ML IJ SOLN
INTRAMUSCULAR | Status: AC
  Administered 2016-04-14: 4 mg via INTRAVENOUS
  Filled 2016-04-14: qty 2

## 2016-04-14 MED ORDER — NICARDIPINE HCL IN NACL 20-0.86 MG/200ML-% IV SOLN
2.5000 mg/h | Freq: Once | INTRAVENOUS | Status: AC
  Administered 2016-04-14: 5 mg/h via INTRAVENOUS

## 2016-04-14 MED ORDER — LIDOCAINE HCL (CARDIAC) 20 MG/ML IV SOLN
INTRAVENOUS | Status: DC | PRN
  Administered 2016-04-14: 100 mg via INTRAVENOUS

## 2016-04-14 MED ORDER — SODIUM CHLORIDE 0.9 % IV BOLUS (SEPSIS)
2000.0000 mL | Freq: Once | INTRAVENOUS | Status: AC
  Administered 2016-04-14: 2000 mL via INTRAVENOUS

## 2016-04-14 MED ORDER — NICARDIPINE HCL IN NACL 20-0.86 MG/200ML-% IV SOLN
INTRAVENOUS | Status: AC
  Administered 2016-04-14: 5 mg/h via INTRAVENOUS
  Filled 2016-04-14: qty 200

## 2016-04-14 MED ORDER — ALBUTEROL SULFATE (2.5 MG/3ML) 0.083% IN NEBU: 2.5000 mg | INHALATION_SOLUTION | RESPIRATORY_TRACT | Status: DC | PRN

## 2016-04-14 MED ORDER — SUCCINYLCHOLINE CHLORIDE 200 MG/10ML IV SOSY
PREFILLED_SYRINGE | INTRAVENOUS | Status: AC
  Filled 2016-04-14: qty 10

## 2016-04-14 MED ORDER — EPTIFIBATIDE 20 MG/10ML IV SOLN
INTRAVENOUS | Status: AC
  Administered 2016-04-14: 2 mg
  Filled 2016-04-14: qty 10

## 2016-04-14 MED ORDER — DEXTROSE-NACL 5-0.45 % IV SOLN: INTRAVENOUS | Status: DC

## 2016-04-14 MED ORDER — HYDRALAZINE HCL 20 MG/ML IJ SOLN
10.0000 mg | Freq: Once | INTRAMUSCULAR | Status: AC
  Administered 2016-04-14: 10 mg via INTRAVENOUS
  Filled 2016-04-14: qty 1

## 2016-04-14 MED ORDER — SODIUM CHLORIDE 0.9 % IV SOLN
INTRAVENOUS | Status: DC
  Administered 2016-04-14: 2.8 [IU]/h via INTRAVENOUS
  Administered 2016-04-14: 4.6 [IU]/h via INTRAVENOUS
  Filled 2016-04-14: qty 2.5

## 2016-04-14 MED ORDER — IOPAMIDOL (ISOVUE-300) INJECTION 61%
INTRAVENOUS | Status: AC
  Administered 2016-04-14: 15 mL
  Filled 2016-04-14: qty 150

## 2016-04-14 MED ORDER — ANTISEPTIC ORAL RINSE SOLUTION (CORINZ)
7.0000 mL | Freq: Four times a day (QID) | OROMUCOSAL | Status: DC
  Administered 2016-04-14 – 2016-04-18 (×14): 7 mL via OROMUCOSAL

## 2016-04-14 MED ORDER — NITROGLYCERIN 1 MG/10 ML FOR IR/CATH LAB
INTRA_ARTERIAL | Status: AC
  Administered 2016-04-14 (×2): 25 ug
  Filled 2016-04-14: qty 10

## 2016-04-14 MED ORDER — ROCURONIUM BROMIDE 100 MG/10ML IV SOLN
INTRAVENOUS | Status: DC | PRN
  Administered 2016-04-14: 20 mg via INTRAVENOUS
  Administered 2016-04-14: 50 mg via INTRAVENOUS

## 2016-04-14 MED ORDER — STROKE: EARLY STAGES OF RECOVERY BOOK
Freq: Once | Status: DC
  Filled 2016-04-14: qty 1

## 2016-04-14 MED ORDER — EPHEDRINE SULFATE 50 MG/ML IJ SOLN
INTRAMUSCULAR | Status: DC | PRN
  Administered 2016-04-14 (×4): 10 mg via INTRAVENOUS

## 2016-04-14 MED ORDER — LIDOCAINE HCL 1 % IJ SOLN
INTRAMUSCULAR | Status: DC | PRN
  Administered 2016-04-14: 8 mL

## 2016-04-14 MED ORDER — PHENYLEPHRINE HCL 10 MG/ML IJ SOLN
INTRAMUSCULAR | Status: DC | PRN
  Administered 2016-04-14: 160 ug via INTRAVENOUS
  Administered 2016-04-14 (×3): 120 ug via INTRAVENOUS
  Administered 2016-04-14: 80 ug via INTRAVENOUS
  Administered 2016-04-14: 120 ug via INTRAVENOUS

## 2016-04-14 MED ORDER — ROCURONIUM BROMIDE 50 MG/5ML IV SOLN
INTRAVENOUS | Status: AC
  Filled 2016-04-14: qty 1

## 2016-04-14 MED ORDER — FENTANYL CITRATE (PF) 100 MCG/2ML IJ SOLN: 100.0000 ug | INTRAMUSCULAR | Status: DC | PRN

## 2016-04-14 MED ORDER — PROPOFOL 10 MG/ML IV BOLUS
INTRAVENOUS | Status: AC
  Filled 2016-04-14: qty 20

## 2016-04-14 MED ORDER — PRO-STAT SUGAR FREE PO LIQD
30.0000 mL | Freq: Two times a day (BID) | ORAL | Status: DC
  Administered 2016-04-14 – 2016-04-17 (×8): 30 mL
  Filled 2016-04-14 (×9): qty 30

## 2016-04-14 MED ORDER — LABETALOL HCL 5 MG/ML IV SOLN: 10.0000 mg | INTRAVENOUS | Status: DC | PRN

## 2016-04-14 MED ORDER — NICARDIPINE HCL IN NACL 20-0.86 MG/200ML-% IV SOLN: 5.0000 mg/h | INTRAVENOUS | Status: DC

## 2016-04-14 MED ORDER — AMIODARONE HCL 200 MG PO TABS
200.0000 mg | ORAL_TABLET | Freq: Every day | ORAL | Status: DC
  Administered 2016-04-14 – 2016-04-17 (×4): 200 mg via ORAL
  Filled 2016-04-14 (×5): qty 1

## 2016-04-14 MED ORDER — ACETAMINOPHEN 650 MG RE SUPP: 650.0000 mg | RECTAL | Status: DC | PRN

## 2016-04-14 MED ORDER — ISOSORBIDE MONONITRATE ER 30 MG PO TB24
30.0000 mg | ORAL_TABLET | Freq: Every day | ORAL | Status: DC
  Administered 2016-04-14 – 2016-04-17 (×2): 30 mg via ORAL
  Filled 2016-04-14 (×5): qty 1

## 2016-04-14 MED ORDER — PROPOFOL 1000 MG/100ML IV EMUL
INTRAVENOUS | Status: AC
  Filled 2016-04-14: qty 100

## 2016-04-14 MED ORDER — SUCCINYLCHOLINE CHLORIDE 20 MG/ML IJ SOLN
INTRAMUSCULAR | Status: DC | PRN
  Administered 2016-04-14: 100 mg via INTRAVENOUS

## 2016-04-14 MED ORDER — FAMOTIDINE IN NACL 20-0.9 MG/50ML-% IV SOLN
20.0000 mg | Freq: Two times a day (BID) | INTRAVENOUS | Status: DC
  Administered 2016-04-14 – 2016-04-17 (×8): 20 mg via INTRAVENOUS
  Filled 2016-04-14 (×10): qty 50

## 2016-04-14 MED ORDER — POTASSIUM CHLORIDE 10 MEQ/100ML IV SOLN
10.0000 meq | INTRAVENOUS | Status: AC
  Administered 2016-04-14 (×4): 10 meq via INTRAVENOUS
  Filled 2016-04-14 (×5): qty 100

## 2016-04-14 MED ORDER — ONDANSETRON HCL 4 MG/2ML IJ SOLN
4.0000 mg | Freq: Once | INTRAMUSCULAR | Status: AC
  Administered 2016-04-14: 4 mg via INTRAVENOUS

## 2016-04-14 MED ORDER — SODIUM CHLORIDE 0.9 % IV SOLN
INTRAVENOUS | Status: DC | PRN
  Administered 2016-04-14 (×2): via INTRAVENOUS

## 2016-04-14 MED ORDER — ASPIRIN 325 MG PO TABS
ORAL_TABLET | ORAL | Status: AC
  Filled 2016-04-14: qty 1

## 2016-04-14 MED ORDER — CHLORHEXIDINE GLUCONATE 0.12% ORAL RINSE (MEDLINE KIT)
15.0000 mL | Freq: Two times a day (BID) | OROMUCOSAL | Status: DC
  Administered 2016-04-14 – 2016-04-17 (×8): 15 mL via OROMUCOSAL

## 2016-04-14 MED ORDER — SODIUM CHLORIDE 0.9 % IV SOLN
INTRAVENOUS | Status: DC
  Administered 2016-04-14: 03:00:00 via INTRAVENOUS

## 2016-04-14 MED ORDER — CLOPIDOGREL BISULFATE 75 MG PO TABS
ORAL_TABLET | ORAL | Status: AC
  Filled 2016-04-14: qty 4

## 2016-04-14 NOTE — ED Provider Notes (Addendum)
CSN: [id number]     Arrival date & time 04/24/2016  0119 History   None    Chief Complaint  Patient presents with  . Code Stroke     (Consider location/radiation/quality/duration/timing/severity/associated sxs/prior Treatment) HPI Comments: Brought to the emergency department by ambulance from home. Patient was talking to significant other when he began to state that his left leg felt numb and then he passed out. Upon awakening was complaining of headache. Speech has become garbled and he is not moving his left side. Level V Caveat due to acuity.   Past Medical History  Diagnosis Date  . Diabetes mellitus with complication (HCC)   . Hypertension   . Syncope     a. 02/2015 - felt 2/2 rapid AF/AFL.  Marland Kitchen Atrial fibrillation and flutter (HCC)     a. Dx 02/2015 - placed on Xarelto; b. 09/2015 recurrence in setting of PNA.  . Ischemic cardiomyopathy     a. 09/2015 Echo: EF 35-40%.  Marland Kitchen CAD (coronary artery disease)     a. Dx 02/2015 - 2V CAD with CTO LAD, diffuse dz in PDA, OM - med rx.  b. cath 01/2016 no change in anatomy, medical therapy  . Hypertensive heart disease   . Hyperlipidemia   . Mitral regurgitation     a. Mod by echo 02/2015,  . Tobacco abuse   . Chronic systolic CHF (congestive heart failure) (HCC)     a. 09/2015 Echo: EF 35-40%, sev apical/antlat, apical HK, mild MR, mildly dil LA.  Marland Kitchen ARF (acute respiratory failure) (HCC)   . Pneumonia 09/2015  . GERD (gastroesophageal reflux disease)   . Headache   . Arthritis     ra  . NSTEMI (non-ST elevated myocardial infarction) (HCC) 10/2015  . Excessive daytime sleepiness 03/06/2016   Past Surgical History  Procedure Laterality Date  . Right thumb surgery     . Cardiac catheterization N/A 02/24/2015    Procedure: Left Heart Cath and Coronary Angiography;  Surgeon: Marykay Lex, MD;  Location: [facility] INVASIVE CV LAB CUPID;  Service: Cardiovascular;  Laterality: N/A;  . Cardiac catheterization  02/24/2015    Procedure: Coronary/Bypass  Graft CTO Intervention;  Surgeon: Marykay Lex, MD;  Location: [facility] INVASIVE CV LAB CUPID;  Service: Cardiovascular;;  . Cardiac catheterization N/A 11/03/2015    Procedure: Left Heart Cath and Coronary Angiography;  Surgeon: Tonny Bollman, MD;  Location: [facility] INVASIVE CV LAB;  Service: Cardiovascular;  Laterality: N/A;  . Cardiac catheterization N/A 11/03/2015    Procedure: Coronary Stent Intervention;  Surgeon: Tonny Bollman, MD;  Location: [facility] INVASIVE CV LAB;  Service: Cardiovascular;  Laterality: N/A;  RAMUS  . Cardiac catheterization N/A 02/06/2016    Procedure: Left Heart Cath and Coronary Angiography;  Surgeon: Marykay Lex, MD;  Location: [facility] INVASIVE CV LAB;  Service: Cardiovascular;  Laterality: N/A;   Family History  Problem Relation Age of Onset  . Diabetes Mother   . Hypertension Mother   . Heart disease Mother   . Cancer Mother     stomach cancer   . Diabetes Father   . Hypertension Father   . Heart disease Father   . Diabetes Sister   . Hypertension Sister   . Cancer Sister     breast cancer   . Diabetes Brother   . Hypertension Brother   . Heart disease Brother   . Stroke Brother   . Heart attack Mother   . Heart attack Father   . Heart attack Brother  Social History  Substance Use Topics  . Smoking status: Current Some Day Smoker -- 0.10 packs/day for 30 years    Types: Cigarettes  . Smokeless tobacco: Never Used  . Alcohol Use: No    Review of Systems  Unable to perform ROS: Acuity of condition      Allergies  Review of patient's allergies indicates no known allergies.  Home Medications   Prior to Admission medications   Medication Sig Start Date End Date Taking? Authorizing Provider  albuterol (PROVENTIL HFA;VENTOLIN HFA) 108 (90 Base) MCG/ACT inhaler Inhale 2 puffs into the lungs every 6 (six) hours as needed for wheezing or shortness of breath. 03/19/16   Mihai Croitoru, MD  amiodarone (PACERONE) 200 MG tablet Take 1 tablet (200 mg total) by  mouth daily. 12/01/15   Maryann Mikhail, DO  atorvastatin (LIPITOR) 80 MG tablet Take 1 tablet (80 mg total) by mouth every evening. 10/17/15   Ripudeep Jenna Luo, MD  Blood Glucose Monitoring Suppl (TRUE METRIX METER) DEVI 1 Device by Does not apply route 4 (four) times daily -  before meals and at bedtime. 01/30/16   Jaclyn Shaggy, MD  clopidogrel (PLAVIX) 75 MG tablet Take 1 tablet (75 mg total) by mouth daily with breakfast. 01/30/16   Jaclyn Shaggy, MD  Dexlansoprazole (DEXILANT) 30 MG capsule Take 1 capsule (30 mg total) by mouth daily. 12/30/14   Josalyn Funches, MD  digoxin (LANOXIN) 0.125 MG tablet Take 0.125 mg by mouth daily.    Historical Provider, MD  diltiazem (DILACOR XR) 240 MG 24 hr capsule Take 240 mg by mouth daily.    Historical Provider, MD  Elastic Bandages & Supports (MEDICAL COMPRESSION STOCKINGS) MISC 20 mm Hg pressure 01/05/16   Josalyn Funches, MD  furosemide (LASIX) 80 MG tablet Take 1 tablet by mouth 2 (two) times daily. 02/11/16   Historical Provider, MD  glucose blood (TRUE METRIX BLOOD GLUCOSE TEST) test strip Use as instructed 01/30/16   Jaclyn Shaggy, MD  insulin aspart (NOVOLOG FLEXPEN) 100 UNIT/ML FlexPen Before each meal 3 times a day, 140-199 - 2 units, 200-250 - 4 units, 251-299 - 6 units,  300-349 - 8 units,  350 or above 10 units. Insulin PEN if approved, provide syringes and needles if needed. 01/29/16   Albertine Grates, MD  Insulin Glargine (LANTUS SOLOSTAR) 100 UNIT/ML Solostar Pen Inject 20 Units into the skin daily at 10 pm. 01/29/16   Albertine Grates, MD  ipratropium-albuterol (DUONEB) 0.5-2.5 (3) MG/3ML SOLN Take 3 mLs by nebulization 2 (two) times daily. 10/17/15   Ripudeep Jenna Luo, MD  isosorbide mononitrate (IMDUR) 30 MG 24 hr tablet Take 1 tablet (30 mg total) by mouth daily. 11/11/15   Jaclyn Shaggy, MD  lisinopril (PRINIVIL,ZESTRIL) 10 MG tablet Take 1 tablet (10 mg total) by mouth daily. 02/10/16   Azalee Course, PA  nitroGLYCERIN (NITROSTAT) 0.4 MG SL tablet Place 1 tablet (0.4 mg total) under  the tongue every 5 (five) minutes as needed for chest pain (up to 3 doses). 02/25/15   Dayna N Dunn, PA-C  potassium chloride SA (K-DUR,KLOR-CON) 20 MEQ tablet Take 1 tablet (20 mEq total) by mouth 2 (two) times daily. 01/29/16   Azalee Course, PA  rivaroxaban (XARELTO) 20 MG TABS tablet Take 1 tablet (20 mg total) by mouth daily with supper. 02/25/15   Dayna N Dunn, PA-C  TRUEPLUS LANCETS 28G MISC 1 each by Does not apply route 4 (four) times daily -  before meals and at bedtime. 01/30/16  Jaclyn Shaggy, MD   BP 132/99 mmHg  Pulse 108  Resp 38  Ht 5\' 10"  (1.778 m)  Wt 172 lb 6.4 oz (78.2 kg)  BMI 24.74 kg/m2  SpO2 85% Physical Exam  Constitutional: He is oriented to person, place, and time. He appears well-developed and well-nourished. No distress.  HENT:  Head: Normocephalic and atraumatic.  Right Ear: Hearing normal.  Left Ear: Hearing normal.  Nose: Nose normal.  Mouth/Throat: Oropharynx is clear and moist and mucous membranes are normal.  Eyes: Conjunctivae are normal. Pupils are equal, round, and reactive to light.  Persistent rightward gaze, unable to cross midline to the left  Neck: Normal range of motion. Neck supple.  Cardiovascular: Regular rhythm, S1 normal and S2 normal.  Exam reveals no gallop and no friction rub.   No murmur heard. Pulmonary/Chest: Effort normal and breath sounds normal. No respiratory distress. He exhibits no tenderness.  Abdominal: Soft. Normal appearance and bowel sounds are normal. There is no hepatosplenomegaly. There is no tenderness. There is no rebound, no guarding, no tenderness at McBurney's point and negative Murphy's sign. No hernia.  Neurological: He is alert and oriented to person, place, and time. He has normal strength. A cranial nerve deficit is present. No sensory deficit. He exhibits abnormal muscle tone (left side). Coordination normal. GCS eye subscore is 4. GCS verbal subscore is 5. GCS motor subscore is 6.  Skin: Skin is warm, dry and intact. No  rash noted. No cyanosis.  Psychiatric: His speech is normal.  Nursing note and vitals reviewed.   ED Course  Procedures (including critical care time) Labs Review Labs Reviewed  PROTIME-INR - Abnormal; Notable for the following:    Prothrombin Time 17.7 (*)    All other components within normal limits  COMPREHENSIVE METABOLIC PANEL - Abnormal; Notable for the following:    Sodium 130 (*)    Chloride 98 (*)    Glucose, Bld 724 (*)    Creatinine, Ser 1.72 (*)    Calcium 8.6 (*)    Total Protein 5.9 (*)    Albumin 2.8 (*)    Alkaline Phosphatase 198 (*)    Total Bilirubin 1.4 (*)    GFR calc non Af Amer 44 (*)    GFR calc Af Amer 51 (*)    All other components within normal limits  URINALYSIS, ROUTINE W REFLEX MICROSCOPIC (NOT AT [facility]) - Abnormal; Notable for the following:    Color, Urine STRAW (*)    Specific Gravity, Urine <1.005 (*)    Glucose, UA >1000 (*)    Hgb urine dipstick TRACE (*)    All other components within normal limits  URINE RAPID DRUG SCREEN, HOSP PERFORMED - Abnormal; Notable for the following:    Cocaine POSITIVE (*)    All other components within normal limits  URINE MICROSCOPIC-ADD ON - Abnormal; Notable for the following:    Squamous Epithelial / LPF 0-5 (*)    Bacteria, UA FEW (*)    All other components within normal limits  CBG MONITORING, ED - Abnormal; Notable for the following:    Glucose-Capillary 582 (*)    All other components within normal limits  I-STAT CHEM 8, ED - Abnormal; Notable for the following:    Sodium 133 (*)    Chloride 98 (*)    Creatinine, Ser 1.50 (*)    Glucose, Bld 664 (*)    Calcium, Ion 1.07 (*)    All other components within normal limits  CBG MONITORING, ED -  Abnormal; Notable for the following:    Glucose-Capillary >600 (*)    All other components within normal limits  CBG MONITORING, ED - Abnormal; Notable for the following:    Glucose-Capillary 506 (*)    All other components within normal limits  URINE  CULTURE  APTT  CBC  DIFFERENTIAL  I-STAT TROPOININ, ED    Imaging Review Ct Angio Head W/cm &/or Wo Cm  2016/04/16  CLINICAL DATA:  RIGHT facial droop, slurred speech. Headache and syncopal episode. History of hypertension, diabetes, CHF. EXAM: CT ANGIOGRAPHY HEAD AND NECK TECHNIQUE: Multidetector CT imaging of the head and neck was performed using the standard protocol during bolus administration of intravenous contrast. Multiplanar CT image reconstructions and MIPs were obtained to evaluate the vascular anatomy. Carotid stenosis measurements (when applicable) are obtained utilizing NASCET criteria, using the distal internal carotid diameter as the denominator. Multidetector CT imaging of the head was performed using the standard protocol during bolus administration of intravenous contrast. Multiplanar CT image reconstructions and MIPs were obtained to evaluate the vascular anatomy. Automated contrast tree during at failed, technologist may annually administered bolus, with delayed, non angiographic phase. CONTRAST:  50 cc Isovue 370 COMPARISON:  CT HEAD April 16, 2016 at 0134 hours FINDINGS: Manual contrast administration results in non angiographic phase. Extremely limited evaluation. CTA NECK Aortic arch: Normal appearance of the thoracic arch, 2 vessel arch is a normal variant. No definite stenosis arch vessel origins though limited assessment. Right carotid system: Common carotid artery is widely patent, coursing in a straight line fashion. RIGHT internal carotid artery is occluded distal to the origin though, limited evaluation. Left carotid system: Common carotid artery is widely patent, coursing in a straight line fashion. LEFT internal carotid artery is patent. Vertebral arteries:Vertebral arteries are patent. Skeleton: No acute osseous process though bone windows have not been submitted. Bulky calcified stylohyoid ligament results in susceptibility artifact. Dental prosthesis results in  susceptibility artifact. Other neck: Soft tissues of the neck are non-acute though, not tailored for evaluation. Small RIGHT greater than LEFT pleural effusions. CTA HEAD Anterior circulation: RIGHT internal carotid artery is occluded throughout the course. Due to bolus timing, at isodense appearance of the cavernous sinus and cavernous internal carotid arteries. RIGHT internal carotid artery remains occluded to the terminus. LEFT internal carotid artery is patent. Poor contrast opacification of RIGHT anterior and RIGHT middle cerebral arteries. LEFT anterior middle cerebral arteries are patent though limited evaluation. Posterior circulation: Vertebral arteries and basilar artery patent. Poorly visualized mid to distal RIGHT posterior cerebral artery conforming to prior stroke. LEFT posterior cerebral artery appears patent. IMPRESSION: Extremely limited assessment due to technical difficulties. Non angiographic phase. In addition, streak artifact from bulky calcified stylohyoid ligaments limits evaluation of the carotid bifurcations . RIGHT internal artery occlusion to the level of the carotid terminus with minimal contrast within RIGHT anterior and middle cerebral artery. Bilateral pleural effusions. Acute findings discussed with and reconfirmed by Dr.CHARLES STEWART on Apr 16, 2016 at 2:45 am. Electronically Signed   By: Awilda Metro M.D.   On: 2016-04-16 02:57   Ct Head Wo Contrast  Apr 16, 2016  CLINICAL DATA:  55 year old male with code stroke and syncopal episode. EXAM: CT HEAD WITHOUT CONTRAST CT CERVICAL SPINE WITHOUT CONTRAST TECHNIQUE: Multidetector CT imaging of the head and cervical spine was performed following the standard protocol without intravenous contrast. Multiplanar CT image reconstructions of the cervical spine were also generated. COMPARISON:  Head CT dated 11/22/2015 FINDINGS: CT HEAD FINDINGS Evaluation of this exam is limited  due to motion artifact. The ventricles and sulci are  appropriate in size for patient's age. Mild periventricular and deep white matter chronic microvascular ischemic changes noted. There is a large area of old infarct and encephalomalacia involving the right occipital lobe which is new compared the prior study dated 11/22/2015. There is no acute intracranial hemorrhage. There is a 4 mm segment of high attenuation involving the right MCA which is new compared to the prior study and may represent a right MCA thrombus. There is apparent asymmetric prominence of the right centrum semiovale white matter which may represent mild underlying edema. The visualized paranasal sinuses and mastoid air cells are clear. The calvarium is intact. CT CERVICAL SPINE FINDINGS There is no acute fracture or subluxation of the cervical spine.Multilevel degenerative changes with osteophyte formation.The odontoid and spinous processes are intact.There is normal anatomic alignment of the C1-C2 lateral masses. The visualized soft tissues appear unremarkable. Partially visualized bilateral pleural effusions. IMPRESSION: No acute intracranial hemorrhage. Focal high attenuation in the M1 segment of the right MCA with possible mild edema in the right centrum semiovale. Findings concerning for right MCA thrombus and possible right MCA territory acute ischemia. Correlation with clinical exam and further evaluation with CT angiography or MRI recommended. Right occipital old infarct and encephalomalacia. No acute/traumatic cervical spine pathology. These results were called by telephone at the time of interpretation on 04/24/2016 at 1:53 am to Dr.Stewart, who verbally acknowledged these results. Electronically Signed   By: Elgie Collard M.D.   On: 04/24/16 01:57   Ct Angio Neck W Or Wo Contrast  2016/04/24  CLINICAL DATA:  RIGHT facial droop, slurred speech. Headache and syncopal episode. History of hypertension, diabetes, CHF. EXAM: CT ANGIOGRAPHY HEAD AND NECK TECHNIQUE: Multidetector CT  imaging of the head and neck was performed using the standard protocol during bolus administration of intravenous contrast. Multiplanar CT image reconstructions and MIPs were obtained to evaluate the vascular anatomy. Carotid stenosis measurements (when applicable) are obtained utilizing NASCET criteria, using the distal internal carotid diameter as the denominator. Multidetector CT imaging of the head was performed using the standard protocol during bolus administration of intravenous contrast. Multiplanar CT image reconstructions and MIPs were obtained to evaluate the vascular anatomy. Automated contrast tree during at failed, technologist may annually administered bolus, with delayed, non angiographic phase. CONTRAST:  50 cc Isovue 370 COMPARISON:  CT HEAD 24-Apr-2016 at 0134 hours FINDINGS: Manual contrast administration results in non angiographic phase. Extremely limited evaluation. CTA NECK Aortic arch: Normal appearance of the thoracic arch, 2 vessel arch is a normal variant. No definite stenosis arch vessel origins though limited assessment. Right carotid system: Common carotid artery is widely patent, coursing in a straight line fashion. RIGHT internal carotid artery is occluded distal to the origin though, limited evaluation. Left carotid system: Common carotid artery is widely patent, coursing in a straight line fashion. LEFT internal carotid artery is patent. Vertebral arteries:Vertebral arteries are patent. Skeleton: No acute osseous process though bone windows have not been submitted. Bulky calcified stylohyoid ligament results in susceptibility artifact. Dental prosthesis results in susceptibility artifact. Other neck: Soft tissues of the neck are non-acute though, not tailored for evaluation. Small RIGHT greater than LEFT pleural effusions. CTA HEAD Anterior circulation: RIGHT internal carotid artery is occluded throughout the course. Due to bolus timing, at isodense appearance of the cavernous  sinus and cavernous internal carotid arteries. RIGHT internal carotid artery remains occluded to the terminus. LEFT internal carotid artery is patent. Poor contrast  opacification of RIGHT anterior and RIGHT middle cerebral arteries. LEFT anterior middle cerebral arteries are patent though limited evaluation. Posterior circulation: Vertebral arteries and basilar artery patent. Poorly visualized mid to distal RIGHT posterior cerebral artery conforming to prior stroke. LEFT posterior cerebral artery appears patent. IMPRESSION: Extremely limited assessment due to technical difficulties. Non angiographic phase. In addition, streak artifact from bulky calcified stylohyoid ligaments limits evaluation of the carotid bifurcations . RIGHT internal artery occlusion to the level of the carotid terminus with minimal contrast within RIGHT anterior and middle cerebral artery. Bilateral pleural effusions. Acute findings discussed with and reconfirmed by Dr.CHARLES STEWART on 03/26/2016 at 2:45 am. Electronically Signed   By: Awilda Metro M.D.   On: 04/23/2016 02:57   Ct Cervical Spine Wo Contrast  03/30/2016  CLINICAL DATA:  55 year old male with code stroke and syncopal episode. EXAM: CT HEAD WITHOUT CONTRAST CT CERVICAL SPINE WITHOUT CONTRAST TECHNIQUE: Multidetector CT imaging of the head and cervical spine was performed following the standard protocol without intravenous contrast. Multiplanar CT image reconstructions of the cervical spine were also generated. COMPARISON:  Head CT dated 11/22/2015 FINDINGS: CT HEAD FINDINGS Evaluation of this exam is limited due to motion artifact. The ventricles and sulci are appropriate in size for patient's age. Mild periventricular and deep white matter chronic microvascular ischemic changes noted. There is a large area of old infarct and encephalomalacia involving the right occipital lobe which is new compared the prior study dated 11/22/2015. There is no acute intracranial  hemorrhage. There is a 4 mm segment of high attenuation involving the right MCA which is new compared to the prior study and may represent a right MCA thrombus. There is apparent asymmetric prominence of the right centrum semiovale white matter which may represent mild underlying edema. The visualized paranasal sinuses and mastoid air cells are clear. The calvarium is intact. CT CERVICAL SPINE FINDINGS There is no acute fracture or subluxation of the cervical spine.Multilevel degenerative changes with osteophyte formation.The odontoid and spinous processes are intact.There is normal anatomic alignment of the C1-C2 lateral masses. The visualized soft tissues appear unremarkable. Partially visualized bilateral pleural effusions. IMPRESSION: No acute intracranial hemorrhage. Focal high attenuation in the M1 segment of the right MCA with possible mild edema in the right centrum semiovale. Findings concerning for right MCA thrombus and possible right MCA territory acute ischemia. Correlation with clinical exam and further evaluation with CT angiography or MRI recommended. Right occipital old infarct and encephalomalacia. No acute/traumatic cervical spine pathology. These results were called by telephone at the time of interpretation on 04/22/2016 at 1:53 am to Dr.Stewart, who verbally acknowledged these results. Electronically Signed   By: Elgie Collard M.D.   On: 04/06/2016 01:57   Dg Chest Port 1 View  04/23/2016  CLINICAL DATA:  Acute onset of shortness of breath. Initial encounter. EXAM: PORTABLE CHEST 1 VIEW COMPARISON:  CTA of the neck performed earlier today at 2:08 a.m., and chest radiograph performed 02/07/2016 FINDINGS: Known small right and trace left pleural effusions are not well seen on this study. Mild right perihilar opacity could reflect pneumonia. No pneumothorax is seen. The cardiomediastinal silhouette is mildly enlarged. No acute osseous abnormalities identified. IMPRESSION: 1. Mild right  perihilar opacity raises question for pneumonia. 2. Known small right and trace left pleural effusions are not well seen on this study. 3. Mild cardiomegaly. Electronically Signed   By: Roanna Raider M.D.   On: 04/03/2016 03:12   I have personally reviewed and evaluated these images and  lab results as part of my medical decision-making.   EKG Interpretation None      MDM   Final diagnoses:  Hyperglycemia  Hematemesis with nausea  Cerebrovascular accident (CVA) due to other mechanism [facility])  Uncontrolled hypertension    Patient presents to the emergency department for evaluation of suspected stroke. Patient had onset of left-sided numbness followed by syncope at home. EMS was immediately called and he was brought to the emergency department approximately 45 minutes after onset of symptoms. Patient had flaccidity of his left side with rightward gaze preference, inability to cross midline to the left. This was consistent with acute CVA. Screening CT concerning for acute MCA infarct. CT angiography attempted, but was a poor quality study.  CT cervical spine performed because of syncope and fall. Patient is oriented at this time and not complaining of neck pain. Examination did not reveal significant tenderness, CT cervical spine negative for acute fracture. Patient's cervical spine cleared.  Patient was very hypertensive at arrival. He also was found to have uncontrolled blood glucose. Patient given IV insulin and placed on a glucose stabilizer, blood sugar is improving. He was initially given hydralazine and then started on low-dose Cardene. Blood pressure has improved as well.  Patient has had emesis that is pink, frothy and the likely blood-tinged here in the ER. He has not had any gross hematemesis or vomiting of coffee grounds. This will need to be monitored as he is on Xarelto.  Patient discussed with critical care, will consult.  CRITICAL CARE Performed by: Gilda Crease   Total critical care time: 45 minutes  Critical care time was exclusive of separately billable procedures and treating other patients.  Critical care was necessary to treat or prevent imminent or life-threatening deterioration.  Critical care was time spent personally by me on the following activities: development of treatment plan with patient and/or surrogate as well as nursing, discussions with consultants, evaluation of patient's response to treatment, examination of patient, obtaining history from patient or surrogate, ordering and performing treatments and interventions, ordering and review of laboratory studies, ordering and review of radiographic studies, pulse oximetry and re-evaluation of patient's condition.    Gilda Crease, MD 04/22/2016 [id number]  Gilda Crease, MD 04/10/2016 [phone]

## 2016-04-14 NOTE — Progress Notes (Signed)
   04/13/2016 0900  Clinical Encounter Type  Visited With Patient and family together  Visit Type Follow-up;Social support  Referral From Nurse  Consult/Referral To Chaplain  Spiritual Encounters  Spiritual Needs Emotional  Stress Factors  Patient Stress Factors Health changes;Loss of control;Major life changes  Family Stress Factors Family relationships;Health changes;Loss of control   Chaplain visited with pt and family.  Pt was unresponsive and being seen by the nurse.  Girlfriend was seated in the room and exhausted.  Girlfriend requested that Chaplain return in the afternoon to offer prayer.  Girlfriend did disclose that she is diabetic. Chaplain offered ministry of hospitality but she declined at this time.  Sean Hinton 04/19/2016 9:04 AM

## 2016-04-14 NOTE — Anesthesia Postprocedure Evaluation (Signed)
Anesthesia Post Note  Patient: Sean Hinton  Procedure(s) Performed: Procedure(s) (LRB): RADIOLOGY WITH ANESTHESIA (N/A)  Patient location during evaluation: SICU Anesthesia Type: General Level of consciousness: sedated Pain management: pain level controlled Vital Signs Assessment: post-procedure vital signs reviewed and stable Respiratory status: respiratory function stable Cardiovascular status: stable Postop Assessment: no signs of nausea or vomiting Anesthetic complications: no    Last Vitals:  Filed Vitals:   04/16/2016 0520 04/16/2016 0525  BP: 138/96 145/93  Pulse: 65 105  Resp: 32 21    Last Pain:  Filed Vitals:   04/13/2016 0849  PainSc: 10-Worst pain ever                 Sebastian Ache

## 2016-04-14 NOTE — Progress Notes (Signed)
Code stroke called on 55 y.o male brought to the emergency room following acute onset of weakness involving his left side, left side numbness and neglect, left facial droop, aphasia, right side gaze,  and garbled speech. Patient began complaining of numbness involving his left arm then loss consciousness briefly, awakened with droop and slurred speech. History diabetes mellitus, hypertension, hyperlipidemia, atrial fibrillation on Xaralto, coronary artery disease, previous stroke and tobacco and cocaine abuse. LSN 0045.  Taken to CT scan STAT, reviewed per Neurologist Dr. Roseanne Reno. NIHSS 23. Blood glucose initially 724,insulin gtt to be started. Pt for CTA and possibly IR pending results and CBG control. Not a TPA candidate as he is currently taking Xarelto for anticoagulation .  Please refer to Neurologist note and flowsheet for complete stroke details.

## 2016-04-14 NOTE — ED Notes (Signed)
Critical glucose reported to Dr Blinda Leatherwood

## 2016-04-14 NOTE — ED Notes (Signed)
Patient put on 2 L o2 for o2 of 84% and SOB

## 2016-04-14 NOTE — ED Notes (Addendum)
BP high, vomiting blood, EDP and neuro aware, orders received and initiated. Family at [facility], pt alert, NAD, calm, cooperative, airway intact.

## 2016-04-14 NOTE — ED Notes (Signed)
IR discussed with pt and family/ POA at [facility].

## 2016-04-14 NOTE — Telephone Encounter (Signed)
I spoke with nurse. We can't really just come see the patient in consult unless primary team call for one. I asked her to have primary team call us if they want formal consult. Kenny Rea PA-C

## 2016-04-14 NOTE — Anesthesia Procedure Notes (Signed)
Procedure Name: Intubation Date/Time: 04-15-16 5:43 AM Performed by: Arlice Colt B Pre-anesthesia Checklist: Patient identified, Emergency Drugs available, Suction available, Patient being monitored and Timeout performed Patient Re-evaluated:Patient Re-evaluated prior to inductionOxygen Delivery Method: Circle system utilized Preoxygenation: Pre-oxygenation with 100% oxygen Intubation Type: IV induction and Rapid sequence Laryngoscope Size: Mac and 3 Grade View: Grade II Tube type: Subglottic suction tube Tube size: 8.0 mm Number of attempts: 1 Airway Equipment and Method: Stylet Placement Confirmation: ETT inserted through vocal cords under direct vision,  positive ETCO2 and breath sounds checked- equal and bilateral Secured at: 24 cm Tube secured with: Tape Dental Injury: Teeth and Oropharynx as per pre-operative assessment

## 2016-04-14 NOTE — Telephone Encounter (Signed)
Rosann Auerbach is off today - i was informed to send cardiology referrals/notifications to [facility]. Message routed.

## 2016-04-14 NOTE — H&P (Signed)
Admission H&P    Chief Complaint: Acute onset of left-sided weakness and neglect.  HPI: Sean Hinton is an 55 y.o. male history diabetes mellitus, hypertension, hyperlipidemia, atrial fibrillation on Xaralto, coronary artery disease, previous stroke and tobacco and cocaine abuse, brought to the emergency room and code stroke status following acute onset of weakness involving his left side, as well as numbness and neglect. Patient began complaining of numbness involving his left arm then loss consciousness briefly. When he woke up he was noted to have left facial droop, garbled speech and inability to move his left extremities. He was last known well at 12:45 AM today. CT scan of his head showed old right PCA territory stroke. There were no acute changes, including no intracranial hemorrhage. NIH stroke score was 21. Patient was not deemed a candidate for TPA because he takes [place]. His INR was 1.45. Patient has also been on Plavix 75 mg per day. CT angiogram was somewhat inconclusive for intracranial large vessel occlusion but indicated right MCA and ACA flow abnormalities, in addition to right ICA occlusion demonstrated on cervical CTA. Patient's blood sugar was markedly elevated at 724. IV insulin drip was started. Blood pressure was also elevated requiring intervention with hydralazine. Urine drug screen was positive for cocaine.   Interventional radiologist was consulted and agreed to perform catheter cerebral angiogram and possible endovascular revascularization procedures, but after blood sugar was under better controlled. At 4:30 AM patient's blood sugar was 506.  LSN: 12:45 AM on 03/25/2016 tPA Given: No: on Xaralto for anticoagulation mRankin:  Past Medical History  Diagnosis Date  . Diabetes mellitus with complication ([place])   . Hypertension   . Syncope     a. 02/2015 - felt 2/2 rapid AF/AFL.  Marland Kitchen Atrial fibrillation and flutter ([place])     a. Dx 02/2015 - placed on Xarelto; b. 09/2015  recurrence in setting of PNA.  . Ischemic cardiomyopathy     a. 09/2015 Echo: EF 35-40%.  Marland Kitchen CAD (coronary artery disease)     a. Dx 02/2015 - 2V CAD with CTO LAD, diffuse dz in PDA, OM - med rx.  b. cath 01/2016 no change in anatomy, medical therapy  . Hypertensive heart disease   . Hyperlipidemia   . Mitral regurgitation     a. Mod by echo 02/2015,  . Tobacco abuse   . Chronic systolic CHF (congestive heart failure) ([place])     a. 09/2015 Echo: EF 35-40%, sev apical/antlat, apical HK, mild MR, mildly dil LA.  Marland Kitchen ARF (acute respiratory failure) ([place])   . Pneumonia 09/2015  . GERD (gastroesophageal reflux disease)   . Headache   . Arthritis     ra  . NSTEMI (non-ST elevated myocardial infarction) ([place]) 10/2015  . Excessive daytime sleepiness 03/06/2016    Past Surgical History  Procedure Laterality Date  . Right thumb surgery     . Cardiac catheterization N/A 02/24/2015    Procedure: Left Heart Cath and Coronary Angiography;  Surgeon: Leonie Man, MD;  Location: [facility] INVASIVE CV LAB CUPID;  Service: Cardiovascular;  Laterality: N/A;  . Cardiac catheterization  02/24/2015    Procedure: Coronary/Bypass Graft CTO Intervention;  Surgeon: Leonie Man, MD;  Location: [facility] INVASIVE CV LAB CUPID;  Service: Cardiovascular;;  . Cardiac catheterization N/A 11/03/2015    Procedure: Left Heart Cath and Coronary Angiography;  Surgeon: Sherren Mocha, MD;  Location: [place] CV LAB;  Service: Cardiovascular;  Laterality: N/A;  . Cardiac catheterization N/A 11/03/2015  Procedure: Coronary Stent Intervention;  Surgeon: Sherren Mocha, MD;  Location: [place] CV LAB;  Service: Cardiovascular;  Laterality: N/A;  RAMUS  . Cardiac catheterization N/A 02/06/2016    Procedure: Left Heart Cath and Coronary Angiography;  Surgeon: Leonie Man, MD;  Location: [place] CV LAB;  Service: Cardiovascular;  Laterality: N/A;    Family History  Problem Relation Age of Onset  . Diabetes Mother   .  Hypertension Mother   . Heart disease Mother   . Cancer Mother     stomach cancer   . Diabetes Father   . Hypertension Father   . Heart disease Father   . Diabetes Sister   . Hypertension Sister   . Cancer Sister     breast cancer   . Diabetes Brother   . Hypertension Brother   . Heart disease Brother   . Stroke Brother   . Heart attack Mother   . Heart attack Father   . Heart attack Brother    Social History:  reports that he has been smoking Cigarettes.  He has a 3 pack-year smoking history. He has never used smokeless tobacco. He reports that he uses illicit drugs (Cocaine). He reports that he does not drink alcohol.  Allergies: No Known Allergies  Medications: Preadmission medications were reviewed by me.  ROS: Unavailable due to patient's altered mental state and largely garbled speech.  Physical Examination: Blood pressure 147/114, pulse 116, resp. rate 40, height 5' 10"  (1.778 m), weight 78.2 kg (172 lb 6.4 oz), SpO2 100 %.  HEENT-  Normocephalic, no lesions, without obvious abnormality.  Normal external eye and conjunctiva.  Normal TM's bilaterally.  Normal auditory canals and external ears. Normal external nose, mucus membranes and septum.  Normal pharynx. Neck supple with no masses, nodes, nodules or enlargement. Cardiovascular - tachycardia with irregular, irregular rhythm, normal S1 and S2, no murmur or gallop Lungs - chest clear, no wheezing, rales, normal symmetric air entry, irregular breathing pattern Abdomen - soft, non-tender; bowel sounds normal; no masses,  no organomegaly Extremities - no joint deformities, effusion, or inflammation and no edema  Neurologic Examination: Mental Status: Lethargic, oriented, short of breath with irregular breathing pattern.  Speech fluent without evidence of aphasia. Able to follow commands without difficulty. Cranial Nerves: II-Visual fields were normal. III/IV/VI-Pupils were equal and reacted normally to light.  Extraocular movements were  admitted to midline and to the right side with right conjugate gaze.    V/VII-no clicks left side; moderate left lower facial weakness. VIII-normal. X-moderately severe dysarthria. XI: trapezius strength/neck flexion strength normal bilaterally XII-midline tongue extension with normal strength. Motor: Flaccid paralysis of left extremities; normal strength and tone of right extremities. Sensory: Neglected left side. Deep Tendon Reflexes: 2+ and symmetric. Plantars: Mute bilaterally Cerebellar: Not tested. Carotid auscultation: Normal  Results for orders placed or performed during the hospital encounter of 04/16/2016 (from the past 48 hour(s))  Protime-INR     Status: Abnormal   Collection Time: 03/28/2016  1:43 AM  Result Value Ref Range   Prothrombin Time 17.7 (H) 11.6 - 15.2 seconds   INR 1.45 0.00 - 1.49  APTT     Status: None   Collection Time: 04/15/2016  1:43 AM  Result Value Ref Range   aPTT 30 24 - 37 seconds  CBC     Status: None   Collection Time: 03/30/2016  1:43 AM  Result Value Ref Range   WBC 8.4 4.0 - 10.5 K/uL   RBC 4.82  4.22 - 5.81 MIL/uL   Hemoglobin 13.1 13.0 - 17.0 g/dL   HCT 40.1 39.0 - 52.0 %   MCV 83.2 78.0 - 100.0 fL   MCH 27.2 26.0 - 34.0 pg   MCHC 32.7 30.0 - 36.0 g/dL   RDW 14.8 11.5 - 15.5 %   Platelets 164 150 - 400 K/uL  Differential     Status: None   Collection Time: 04/16/2016  1:43 AM  Result Value Ref Range   Neutrophils Relative % 69 %   Neutro Abs 5.9 1.7 - 7.7 K/uL   Lymphocytes Relative 23 %   Lymphs Abs 1.9 0.7 - 4.0 K/uL   Monocytes Relative 7 %   Monocytes Absolute 0.6 0.1 - 1.0 K/uL   Eosinophils Relative 1 %   Eosinophils Absolute 0.0 0.0 - 0.7 K/uL   Basophils Relative 0 %   Basophils Absolute 0.0 0.0 - 0.1 K/uL  Comprehensive metabolic panel     Status: Abnormal   Collection Time: 03/27/2016  1:43 AM  Result Value Ref Range   Sodium 130 (L) 135 - 145 mmol/L   Potassium 3.8 3.5 - 5.1 mmol/L   Chloride 98  (L) 101 - 111 mmol/L   CO2 23 22 - 32 mmol/L   Glucose, Bld 724 (HH) 65 - 99 mg/dL    Comment: CRITICAL RESULT CALLED TO, READ BACK BY AND VERIFIED WITH: C ALBRIGHT,RN [id number] WILDERK    BUN 17 6 - 20 mg/dL   Creatinine, Ser 1.72 (H) 0.61 - 1.24 mg/dL   Calcium 8.6 (L) 8.9 - 10.3 mg/dL   Total Protein 5.9 (L) 6.5 - 8.1 g/dL   Albumin 2.8 (L) 3.5 - 5.0 g/dL   AST 32 15 - 41 U/L   ALT 38 17 - 63 U/L   Alkaline Phosphatase 198 (H) 38 - 126 U/L   Total Bilirubin 1.4 (H) 0.3 - 1.2 mg/dL   GFR calc non Af Amer 44 (L) >60 mL/min   GFR calc Af Amer 51 (L) >60 mL/min    Comment: (NOTE) The eGFR has been calculated using the CKD EPI equation. This calculation has not been validated in all clinical situations. eGFR's persistently <60 mL/min signify possible Chronic Kidney Disease.    Anion gap 9 5 - 15  CBG monitoring, ED     Status: Abnormal   Collection Time: 03/28/2016  1:46 AM  Result Value Ref Range   Glucose-Capillary 582 (HH) 65 - 99 mg/dL  I-stat troponin, ED     Status: None   Collection Time: 04/23/2016  1:48 AM  Result Value Ref Range   Troponin i, poc 0.03 0.00 - 0.08 ng/mL   Comment 3            Comment: Due to the release kinetics of cTnI, a negative result within the first hours of the onset of symptoms does not rule out myocardial infarction with certainty. If myocardial infarction is still suspected, repeat the test at appropriate intervals.   I-Stat Chem 8, ED     Status: Abnormal   Collection Time: 04/03/2016  1:50 AM  Result Value Ref Range   Sodium 133 (L) 135 - 145 mmol/L   Potassium 3.8 3.5 - 5.1 mmol/L   Chloride 98 (L) 101 - 111 mmol/L   BUN 20 6 - 20 mg/dL   Creatinine, Ser 1.50 (H) 0.61 - 1.24 mg/dL   Glucose, Bld 664 (HH) 65 - 99 mg/dL   Calcium, Ion 1.07 (L) 1.12 - 1.23 mmol/L  TCO2 22 0 - 100 mmol/L   Hemoglobin 15.3 13.0 - 17.0 g/dL   HCT 45.0 39.0 - 52.0 %  Urinalysis, Routine w reflex microscopic (not at [facility])     Status: Abnormal    Collection Time: 04/13/2016  2:49 AM  Result Value Ref Range   Color, Urine STRAW (A) YELLOW   APPearance CLEAR CLEAR   Specific Gravity, Urine <1.005 (L) 1.005 - 1.030   pH 5.5 5.0 - 8.0   Glucose, UA >1000 (A) NEGATIVE mg/dL   Hgb urine dipstick TRACE (A) NEGATIVE   Bilirubin Urine NEGATIVE NEGATIVE   Ketones, ur NEGATIVE NEGATIVE mg/dL   Protein, ur NEGATIVE NEGATIVE mg/dL   Nitrite NEGATIVE NEGATIVE   Leukocytes, UA NEGATIVE NEGATIVE  Rapid urine drug screen (hospital performed)     Status: Abnormal   Collection Time: 04/22/2016  2:49 AM  Result Value Ref Range   Opiates NONE DETECTED NONE DETECTED   Cocaine POSITIVE (A) NONE DETECTED   Benzodiazepines NONE DETECTED NONE DETECTED   Amphetamines NONE DETECTED NONE DETECTED   Tetrahydrocannabinol NONE DETECTED NONE DETECTED   Barbiturates NONE DETECTED NONE DETECTED    Comment:        DRUG SCREEN FOR MEDICAL PURPOSES ONLY.  IF CONFIRMATION IS NEEDED FOR ANY PURPOSE, NOTIFY LAB WITHIN 5 DAYS.        LOWEST DETECTABLE LIMITS FOR URINE DRUG SCREEN Drug Class       Cutoff (ng/mL) Amphetamine      1000 Barbiturate      200 Benzodiazepine   756 Tricyclics       433 Opiates          300 Cocaine          300 THC              50   Urine microscopic-add on     Status: Abnormal   Collection Time: 04/03/2016  2:49 AM  Result Value Ref Range   Squamous Epithelial / LPF 0-5 (A) NONE SEEN   WBC, UA 0-5 0 - 5 WBC/hpf   RBC / HPF 0-5 0 - 5 RBC/hpf   Bacteria, UA FEW (A) NONE SEEN   Urine-Other AMORPHOUS URATES/PHOSPHATES   CBG monitoring, ED     Status: Abnormal   Collection Time: 04/21/2016  3:12 AM  Result Value Ref Range   Glucose-Capillary >600 (HH) 65 - 99 mg/dL   Ct Angio Head W/cm &/or Wo Cm  04/19/2016  CLINICAL DATA:  RIGHT facial droop, slurred speech. Headache and syncopal episode. History of hypertension, diabetes, CHF. EXAM: CT ANGIOGRAPHY HEAD AND NECK TECHNIQUE: Multidetector CT imaging of the head and neck was performed  using the standard protocol during bolus administration of intravenous contrast. Multiplanar CT image reconstructions and MIPs were obtained to evaluate the vascular anatomy. Carotid stenosis measurements (when applicable) are obtained utilizing NASCET criteria, using the distal internal carotid diameter as the denominator. Multidetector CT imaging of the head was performed using the standard protocol during bolus administration of intravenous contrast. Multiplanar CT image reconstructions and MIPs were obtained to evaluate the vascular anatomy. Automated contrast tree during at failed, technologist may annually administered bolus, with delayed, non angiographic phase. CONTRAST:  50 cc Isovue 370 COMPARISON:  CT HEAD April 14, 2016 at 0134 hours FINDINGS: Manual contrast administration results in non angiographic phase. Extremely limited evaluation. CTA NECK Aortic arch: Normal appearance of the thoracic arch, 2 vessel arch is a normal variant. No definite stenosis arch vessel origins though limited  assessment. Right carotid system: Common carotid artery is widely patent, coursing in a straight line fashion. RIGHT internal carotid artery is occluded distal to the origin though, limited evaluation. Left carotid system: Common carotid artery is widely patent, coursing in a straight line fashion. LEFT internal carotid artery is patent. Vertebral arteries:Vertebral arteries are patent. Skeleton: No acute osseous process though bone windows have not been submitted. Bulky calcified stylohyoid ligament results in susceptibility artifact. Dental prosthesis results in susceptibility artifact. Other neck: Soft tissues of the neck are non-acute though, not tailored for evaluation. Small RIGHT greater than LEFT pleural effusions. CTA HEAD Anterior circulation: RIGHT internal carotid artery is occluded throughout the course. Due to bolus timing, at isodense appearance of the cavernous sinus and cavernous internal carotid  arteries. RIGHT internal carotid artery remains occluded to the terminus. LEFT internal carotid artery is patent. Poor contrast opacification of RIGHT anterior and RIGHT middle cerebral arteries. LEFT anterior middle cerebral arteries are patent though limited evaluation. Posterior circulation: Vertebral arteries and basilar artery patent. Poorly visualized mid to distal RIGHT posterior cerebral artery conforming to prior stroke. LEFT posterior cerebral artery appears patent. IMPRESSION: Extremely limited assessment due to technical difficulties. Non angiographic phase. In addition, streak artifact from bulky calcified stylohyoid ligaments limits evaluation of the carotid bifurcations . RIGHT internal artery occlusion to the level of the carotid terminus with minimal contrast within RIGHT anterior and middle cerebral artery. Bilateral pleural effusions. Acute findings discussed with and reconfirmed by Dr.Merle Cirelli on 04/08/2016 at 2:45 am. Electronically Signed   By: Elon Alas M.D.   On: 04/19/2016 02:57   Ct Head Wo Contrast  04/01/2016  CLINICAL DATA:  55 year old male with code stroke and syncopal episode. EXAM: CT HEAD WITHOUT CONTRAST CT CERVICAL SPINE WITHOUT CONTRAST TECHNIQUE: Multidetector CT imaging of the head and cervical spine was performed following the standard protocol without intravenous contrast. Multiplanar CT image reconstructions of the cervical spine were also generated. COMPARISON:  Head CT dated 11/22/2015 FINDINGS: CT HEAD FINDINGS Evaluation of this exam is limited due to motion artifact. The ventricles and sulci are appropriate in size for patient's age. Mild periventricular and deep white matter chronic microvascular ischemic changes noted. There is a large area of old infarct and encephalomalacia involving the right occipital lobe which is new compared the prior study dated 11/22/2015. There is no acute intracranial hemorrhage. There is a 4 mm segment of high attenuation  involving the right MCA which is new compared to the prior study and may represent a right MCA thrombus. There is apparent asymmetric prominence of the right centrum semiovale white matter which may represent mild underlying edema. The visualized paranasal sinuses and mastoid air cells are clear. The calvarium is intact. CT CERVICAL SPINE FINDINGS There is no acute fracture or subluxation of the cervical spine.Multilevel degenerative changes with osteophyte formation.The odontoid and spinous processes are intact.There is normal anatomic alignment of the C1-C2 lateral masses. The visualized soft tissues appear unremarkable. Partially visualized bilateral pleural effusions. IMPRESSION: No acute intracranial hemorrhage. Focal high attenuation in the M1 segment of the right MCA with possible mild edema in the right centrum semiovale. Findings concerning for right MCA thrombus and possible right MCA territory acute ischemia. Correlation with clinical exam and further evaluation with CT angiography or MRI recommended. Right occipital old infarct and encephalomalacia. No acute/traumatic cervical spine pathology. These results were called by telephone at the time of interpretation on 04/16/2016 at 1:53 am to [place], who verbally acknowledged these results. Electronically Signed  By: Anner Crete M.D.   On: 03/25/2016 01:57   Ct Angio Neck W Or Wo Contrast  04/16/2016  CLINICAL DATA:  RIGHT facial droop, slurred speech. Headache and syncopal episode. History of hypertension, diabetes, CHF. EXAM: CT ANGIOGRAPHY HEAD AND NECK TECHNIQUE: Multidetector CT imaging of the head and neck was performed using the standard protocol during bolus administration of intravenous contrast. Multiplanar CT image reconstructions and MIPs were obtained to evaluate the vascular anatomy. Carotid stenosis measurements (when applicable) are obtained utilizing NASCET criteria, using the distal internal carotid diameter as the denominator.  Multidetector CT imaging of the head was performed using the standard protocol during bolus administration of intravenous contrast. Multiplanar CT image reconstructions and MIPs were obtained to evaluate the vascular anatomy. Automated contrast tree during at failed, technologist may annually administered bolus, with delayed, non angiographic phase. CONTRAST:  50 cc Isovue 370 COMPARISON:  CT HEAD April 14, 2016 at 0134 hours FINDINGS: Manual contrast administration results in non angiographic phase. Extremely limited evaluation. CTA NECK Aortic arch: Normal appearance of the thoracic arch, 2 vessel arch is a normal variant. No definite stenosis arch vessel origins though limited assessment. Right carotid system: Common carotid artery is widely patent, coursing in a straight line fashion. RIGHT internal carotid artery is occluded distal to the origin though, limited evaluation. Left carotid system: Common carotid artery is widely patent, coursing in a straight line fashion. LEFT internal carotid artery is patent. Vertebral arteries:Vertebral arteries are patent. Skeleton: No acute osseous process though bone windows have not been submitted. Bulky calcified stylohyoid ligament results in susceptibility artifact. Dental prosthesis results in susceptibility artifact. Other neck: Soft tissues of the neck are non-acute though, not tailored for evaluation. Small RIGHT greater than LEFT pleural effusions. CTA HEAD Anterior circulation: RIGHT internal carotid artery is occluded throughout the course. Due to bolus timing, at isodense appearance of the cavernous sinus and cavernous internal carotid arteries. RIGHT internal carotid artery remains occluded to the terminus. LEFT internal carotid artery is patent. Poor contrast opacification of RIGHT anterior and RIGHT middle cerebral arteries. LEFT anterior middle cerebral arteries are patent though limited evaluation. Posterior circulation: Vertebral arteries and basilar artery  patent. Poorly visualized mid to distal RIGHT posterior cerebral artery conforming to prior stroke. LEFT posterior cerebral artery appears patent. IMPRESSION: Extremely limited assessment due to technical difficulties. Non angiographic phase. In addition, streak artifact from bulky calcified stylohyoid ligaments limits evaluation of the carotid bifurcations . RIGHT internal artery occlusion to the level of the carotid terminus with minimal contrast within RIGHT anterior and middle cerebral artery. Bilateral pleural effusions. Acute findings discussed with and reconfirmed by Dr.Kela Baccari on 04/15/2016 at 2:45 am. Electronically Signed   By: Elon Alas M.D.   On: 04/11/2016 02:57   Ct Cervical Spine Wo Contrast  03/27/2016  CLINICAL DATA:  55 year old male with code stroke and syncopal episode. EXAM: CT HEAD WITHOUT CONTRAST CT CERVICAL SPINE WITHOUT CONTRAST TECHNIQUE: Multidetector CT imaging of the head and cervical spine was performed following the standard protocol without intravenous contrast. Multiplanar CT image reconstructions of the cervical spine were also generated. COMPARISON:  Head CT dated 11/22/2015 FINDINGS: CT HEAD FINDINGS Evaluation of this exam is limited due to motion artifact. The ventricles and sulci are appropriate in size for patient's age. Mild periventricular and deep white matter chronic microvascular ischemic changes noted. There is a large area of old infarct and encephalomalacia involving the right occipital lobe which is new compared the prior study dated 11/22/2015.  There is no acute intracranial hemorrhage. There is a 4 mm segment of high attenuation involving the right MCA which is new compared to the prior study and may represent a right MCA thrombus. There is apparent asymmetric prominence of the right centrum semiovale white matter which may represent mild underlying edema. The visualized paranasal sinuses and mastoid air cells are clear. The calvarium is intact.  CT CERVICAL SPINE FINDINGS There is no acute fracture or subluxation of the cervical spine.Multilevel degenerative changes with osteophyte formation.The odontoid and spinous processes are intact.There is normal anatomic alignment of the C1-C2 lateral masses. The visualized soft tissues appear unremarkable. Partially visualized bilateral pleural effusions. IMPRESSION: No acute intracranial hemorrhage. Focal high attenuation in the M1 segment of the right MCA with possible mild edema in the right centrum semiovale. Findings concerning for right MCA thrombus and possible right MCA territory acute ischemia. Correlation with clinical exam and further evaluation with CT angiography or MRI recommended. Right occipital old infarct and encephalomalacia. No acute/traumatic cervical spine pathology. These results were called by telephone at the time of interpretation on 04/04/2016 at 1:53 am to [place], who verbally acknowledged these results. Electronically Signed   By: Anner Crete M.D.   On: 04/22/2016 01:57   Dg Chest Port 1 View  04/01/2016  CLINICAL DATA:  Acute onset of shortness of breath. Initial encounter. EXAM: PORTABLE CHEST 1 VIEW COMPARISON:  CTA of the neck performed earlier today at 2:08 a.m., and chest radiograph performed 02/07/2016 FINDINGS: Known small right and trace left pleural effusions are not well seen on this study. Mild right perihilar opacity could reflect pneumonia. No pneumothorax is seen. The cardiomediastinal silhouette is mildly enlarged. No acute osseous abnormalities identified. IMPRESSION: 1. Mild right perihilar opacity raises question for pneumonia. 2. Known small right and trace left pleural effusions are not well seen on this study. 3. Mild cardiomegaly. Electronically Signed   By: Garald Balding M.D.   On: 04/22/2016 03:12    Assessment: 55 y.o. male with multiple risk factors for stroke as well as previous cerebral infarction presenting with acute right MCA territory  ischemic infarction with right ICA occlusion and possible occlusion of right MCA and ACA, as well.  Stroke Risk Factors - atrial fibrillation, diabetes mellitus, family history, hyperlipidemia, hypertension and cocaine abuse  Plan: 1. Admit to neuro intensive care unit following IR procedures, with CCM consultation for management of acute medical illnesses 2. MRI of the brain without contrast 3. PT consult, OT consult, Speech consult 4. Echocardiogram 5. HgbA1c, fasting lipid panel 6. Prophylactic therapy-  7. Risk factor modification 8. Telemetry monitoring  This patient is critically ill and at significant risk of neurological worsening or death, and care requires constant monitoring of vital signs, hemodynamics,respiratory and cardiac monitoring, neurological assessment, discussion with family, other specialists and medical decision making of high complexity. Total critical care time was 120 minutes.  C.R. Nicole Kindred, MD [facility] [phone]  03/30/2016, 3:56 AM

## 2016-04-14 NOTE — Procedures (Addendum)
Bilateral common carotid arteriograms and RT vert artery angiogram followed by endovascular complete revascularizationof occluded RT ICA prox and distally at the terminus ,the RT MCA and RT ACA with x 1pass with Solitaire FR 4 mmx 40 mm  Retrieval  device and 22m,g of IA integrelin intracranially..Complete revascularization of RT ICA RT MCA TICI 2b revascularization Also noted   scattered   smooth areas of narrowing of MCA and ACA branches ?vasospasm chemical related

## 2016-04-14 NOTE — ED Notes (Addendum)
Care handed over to IR team. No changes. c-collar removed per Dr. Blinda Leatherwood, EDP. Blood sugar checked.cbg = 343. Insulin rate change per glucostabilizer. Shirt cut. 2nd liter NS bolus continuing to infuse w.o. To gravity. Insulin and cardene combined to L FA.

## 2016-04-14 NOTE — Progress Notes (Signed)
Rt 9 french sheath pull, no hematoma present before sheath pull, Deployed Exoceal and held manual pressure with a V pad for 30 minutes. No complications and verified site with Insurance risk surveyor. Pressure dressing and sandbag applied.  Lynann Beaver RT R, VI Juliet King-Cushman RT R

## 2016-04-14 NOTE — ED Notes (Signed)
To IR

## 2016-04-14 NOTE — ED Notes (Signed)
Relative called to insist/demand that the patient receive "broad spectrum antibiotic" for his treatment upon arrival.

## 2016-04-14 NOTE — Consult Note (Signed)
PULMONARY / CRITICAL CARE MEDICINE   Name: Sean Hinton MRN: [id number] DOB: [birth year]    ADMISSION DATE:  05/07/2016 CONSULTATION DATE:  05-07-16  REFERRING MD:  Dr. Blinda Leatherwood  CHIEF COMPLAINT:  Weakness  HISTORY OF PRESENT ILLNESS:   55 yo male smoker had syncopal episode at home witness by his wife.  He had Rt sided headache.  He was noted to have Rt sided gaze, slurred speech, and Lt side flaccid.  His blood sugar was > 600.  His significant other was not sure if he has been taking his medications.  Code stroke activated, and he was seen by neurology.  His UDS was positive for cocaine.  He had episode of vomiting blood in the ER.  He was started on insulin gtt.  Planning to go to IR for potential revascularization. PCCM consulted to assist with medical management in ICU.  PAST MEDICAL HISTORY :  He  has a past medical history of Diabetes mellitus with complication (HCC); Hypertension; Syncope; Atrial fibrillation and flutter (HCC); Ischemic cardiomyopathy; CAD (coronary artery disease); Hypertensive heart disease; Hyperlipidemia; Mitral regurgitation; Tobacco abuse; Chronic systolic CHF (congestive heart failure) (HCC); ARF (acute respiratory failure) (HCC); Pneumonia (09/2015); GERD (gastroesophageal reflux disease); Headache; Arthritis; NSTEMI (non-ST elevated myocardial infarction) (HCC) (10/2015); and Excessive daytime sleepiness (03/06/2016).  PAST SURGICAL HISTORY: He  has past surgical history that includes right thumb surgery ; Cardiac catheterization (N/A, 02/24/2015); Cardiac catheterization (02/24/2015); Cardiac catheterization (N/A, 11/03/2015); Cardiac catheterization (N/A, 11/03/2015); and Cardiac catheterization (N/A, 02/06/2016).  No Known Allergies  No current facility-administered medications on file prior to encounter.   Current Outpatient Prescriptions on File Prior to Encounter  Medication Sig  . albuterol (PROVENTIL HFA;VENTOLIN HFA) 108 (90 Base) MCG/ACT inhaler Inhale 2  puffs into the lungs every 6 (six) hours as needed for wheezing or shortness of breath.  Marland Kitchen amiodarone (PACERONE) 200 MG tablet Take 1 tablet (200 mg total) by mouth daily.  Marland Kitchen atorvastatin (LIPITOR) 80 MG tablet Take 1 tablet (80 mg total) by mouth every evening.  . Blood Glucose Monitoring Suppl (TRUE METRIX METER) DEVI 1 Device by Does not apply route 4 (four) times daily -  before meals and at bedtime.  . clopidogrel (PLAVIX) 75 MG tablet Take 1 tablet (75 mg total) by mouth daily with breakfast.  . Dexlansoprazole (DEXILANT) 30 MG capsule Take 1 capsule (30 mg total) by mouth daily.  . digoxin (LANOXIN) 0.125 MG tablet Take 0.125 mg by mouth daily.  Marland Kitchen diltiazem (DILACOR XR) 240 MG 24 hr capsule Take 240 mg by mouth daily.  Clinical research associate Bandages & Supports (MEDICAL COMPRESSION STOCKINGS) MISC 20 mm Hg pressure  . furosemide (LASIX) 80 MG tablet Take 1 tablet by mouth 2 (two) times daily.  Marland Kitchen glucose blood (TRUE METRIX BLOOD GLUCOSE TEST) test strip Use as instructed  . insulin aspart (NOVOLOG FLEXPEN) 100 UNIT/ML FlexPen Before each meal 3 times a day, 140-199 - 2 units, 200-250 - 4 units, 251-299 - 6 units,  300-349 - 8 units,  350 or above 10 units. Insulin PEN if approved, provide syringes and needles if needed.  . Insulin Glargine (LANTUS SOLOSTAR) 100 UNIT/ML Solostar Pen Inject 20 Units into the skin daily at 10 pm.  . ipratropium-albuterol (DUONEB) 0.5-2.5 (3) MG/3ML SOLN Take 3 mLs by nebulization 2 (two) times daily.  . isosorbide mononitrate (IMDUR) 30 MG 24 hr tablet Take 1 tablet (30 mg total) by mouth daily.  Marland Kitchen lisinopril (PRINIVIL,ZESTRIL) 10 MG tablet Take 1 tablet (10 mg  total) by mouth daily.  . nitroGLYCERIN (NITROSTAT) 0.4 MG SL tablet Place 1 tablet (0.4 mg total) under the tongue every 5 (five) minutes as needed for chest pain (up to 3 doses).  . potassium chloride SA (K-DUR,KLOR-CON) 20 MEQ tablet Take 1 tablet (20 mEq total) by mouth 2 (two) times daily.  . rivaroxaban  (XARELTO) 20 MG TABS tablet Take 1 tablet (20 mg total) by mouth daily with supper.  . TRUEPLUS LANCETS 28G MISC 1 each by Does not apply route 4 (four) times daily -  before meals and at bedtime.    FAMILY HISTORY:  His indicated that his mother is alive. He indicated that his father is deceased.   SOCIAL HISTORY: He  reports that he has been smoking Cigarettes.  He has a 3 pack-year smoking history. He has never used smokeless tobacco. He reports that he uses illicit drugs (Cocaine). He reports that he does not drink alcohol.  REVIEW OF SYSTEMS: limited due to patient cooperation Bolds are positive  Constitutional: weight loss, gain, night sweats, Fevers, chills, fatigue .  HEENT: headaches, Sore throat, sneezing, nasal congestion, post nasal drip, Difficulty swallowing, Tooth/dental problems, visual complaints visual changes, ear ache CV:  chest pain, radiates: ,Orthopnea, PND, swelling in lower extremities, dizziness, palpitations, syncope.  GI  heartburn, indigestion, abdominal pain, nausea, vomiting, diarrhea, change in bowel habits, loss of appetite, bloody stools.  Resp: cough, productive: , hemoptysis, dyspnea, chest pain, pleuritic.  Skin: rash or itching or icterus GU: dysuria, change in color of urine, urgency or frequency. flank pain, hematuria  MS: joint pain or swelling. decreased range of motion  Psych: change in mood or affect. depression or anxiety.  Neuro: difficulty with speech, weakness, numbness, ataxia    SUBJECTIVE:    VITAL SIGNS: BP 150/121 mmHg  Pulse 109  Resp 28  Ht 5\' 10"  (1.778 m)  Wt 78.2 kg (172 lb 6.4 oz)  BMI 24.74 kg/m2  SpO2 93%  HEMODYNAMICS:    VENTILATOR SETTINGS:    INTAKE / OUTPUT:    PHYSICAL EXAMINATION: General:  Male of normal body habitus in NAD Neuro:  Alert, oriented, unable to move L side.  HEENT:  /AT, no appreciable JVD Cardiovascular:  Tachy, regular, no MRG Lungs:  Clear bilateral breath sounds Abdomen:  Soft,  non-tender, non-distended Musculoskeletal:  No acute deformity Skin:  Grossly intact  LABS:  BMET  Recent Labs Lab 05-01-16 0143 05/01/16 0150  NA 130* 133*  K 3.8 3.8  CL 98* 98*  CO2 23  --   BUN 17 20  CREATININE 1.72* 1.50*  GLUCOSE 724* 664*    Electrolytes  Recent Labs Lab 05-01-16 0143  CALCIUM 8.6*    CBC  Recent Labs Lab 05/01/16 0143 May 01, 2016 0150  WBC 8.4  --   HGB 13.1 15.3  HCT 40.1 45.0  PLT 164  --     Coag's  Recent Labs Lab 05/01/2016 0143  APTT 30  INR 1.45    Sepsis Markers No results for input(s): LATICACIDVEN, PROCALCITON, O2SATVEN in the last 168 hours.  ABG No results for input(s): PHART, PCO2ART, PO2ART in the last 168 hours.  Liver Enzymes  Recent Labs Lab 2016-05-01 0143  AST 32  ALT 38  ALKPHOS 198*  BILITOT 1.4*  ALBUMIN 2.8*    Cardiac Enzymes No results for input(s): TROPONINI, PROBNP in the last 168 hours.  Glucose  Recent Labs Lab May 01, 2016 0146 May 01, 2016 0312  GLUCAP 582* >600*    Imaging Ct Angio  Head W/cm &/or Wo Cm  April 27, 2016  CLINICAL DATA:  RIGHT facial droop, slurred speech. Headache and syncopal episode. History of hypertension, diabetes, CHF. EXAM: CT ANGIOGRAPHY HEAD AND NECK TECHNIQUE: Multidetector CT imaging of the head and neck was performed using the standard protocol during bolus administration of intravenous contrast. Multiplanar CT image reconstructions and MIPs were obtained to evaluate the vascular anatomy. Carotid stenosis measurements (when applicable) are obtained utilizing NASCET criteria, using the distal internal carotid diameter as the denominator. Multidetector CT imaging of the head was performed using the standard protocol during bolus administration of intravenous contrast. Multiplanar CT image reconstructions and MIPs were obtained to evaluate the vascular anatomy. Automated contrast tree during at failed, technologist may annually administered bolus, with delayed, non  angiographic phase. CONTRAST:  50 cc Isovue 370 COMPARISON:  CT HEAD 04-27-2016 at 0134 hours FINDINGS: Manual contrast administration results in non angiographic phase. Extremely limited evaluation. CTA NECK Aortic arch: Normal appearance of the thoracic arch, 2 vessel arch is a normal variant. No definite stenosis arch vessel origins though limited assessment. Right carotid system: Common carotid artery is widely patent, coursing in a straight line fashion. RIGHT internal carotid artery is occluded distal to the origin though, limited evaluation. Left carotid system: Common carotid artery is widely patent, coursing in a straight line fashion. LEFT internal carotid artery is patent. Vertebral arteries:Vertebral arteries are patent. Skeleton: No acute osseous process though bone windows have not been submitted. Bulky calcified stylohyoid ligament results in susceptibility artifact. Dental prosthesis results in susceptibility artifact. Other neck: Soft tissues of the neck are non-acute though, not tailored for evaluation. Small RIGHT greater than LEFT pleural effusions. CTA HEAD Anterior circulation: RIGHT internal carotid artery is occluded throughout the course. Due to bolus timing, at isodense appearance of the cavernous sinus and cavernous internal carotid arteries. RIGHT internal carotid artery remains occluded to the terminus. LEFT internal carotid artery is patent. Poor contrast opacification of RIGHT anterior and RIGHT middle cerebral arteries. LEFT anterior middle cerebral arteries are patent though limited evaluation. Posterior circulation: Vertebral arteries and basilar artery patent. Poorly visualized mid to distal RIGHT posterior cerebral artery conforming to prior stroke. LEFT posterior cerebral artery appears patent. IMPRESSION: Extremely limited assessment due to technical difficulties. Non angiographic phase. In addition, streak artifact from bulky calcified stylohyoid ligaments limits evaluation  of the carotid bifurcations . RIGHT internal artery occlusion to the level of the carotid terminus with minimal contrast within RIGHT anterior and middle cerebral artery. Bilateral pleural effusions. Acute findings discussed with and reconfirmed by Dr.CHARLES STEWART on Apr 27, 2016 at 2:45 am. Electronically Signed   By: Awilda Metro M.D.   On: 27-Apr-2016 02:57   Ct Head Wo Contrast  04/27/16  CLINICAL DATA:  55 year old male with code stroke and syncopal episode. EXAM: CT HEAD WITHOUT CONTRAST CT CERVICAL SPINE WITHOUT CONTRAST TECHNIQUE: Multidetector CT imaging of the head and cervical spine was performed following the standard protocol without intravenous contrast. Multiplanar CT image reconstructions of the cervical spine were also generated. COMPARISON:  Head CT dated 11/22/2015 FINDINGS: CT HEAD FINDINGS Evaluation of this exam is limited due to motion artifact. The ventricles and sulci are appropriate in size for patient's age. Mild periventricular and deep white matter chronic microvascular ischemic changes noted. There is a large area of old infarct and encephalomalacia involving the right occipital lobe which is new compared the prior study dated 11/22/2015. There is no acute intracranial hemorrhage. There is a 4 mm segment of high attenuation  involving the right MCA which is new compared to the prior study and may represent a right MCA thrombus. There is apparent asymmetric prominence of the right centrum semiovale white matter which may represent mild underlying edema. The visualized paranasal sinuses and mastoid air cells are clear. The calvarium is intact. CT CERVICAL SPINE FINDINGS There is no acute fracture or subluxation of the cervical spine.Multilevel degenerative changes with osteophyte formation.The odontoid and spinous processes are intact.There is normal anatomic alignment of the C1-C2 lateral masses. The visualized soft tissues appear unremarkable. Partially visualized bilateral  pleural effusions. IMPRESSION: No acute intracranial hemorrhage. Focal high attenuation in the M1 segment of the right MCA with possible mild edema in the right centrum semiovale. Findings concerning for right MCA thrombus and possible right MCA territory acute ischemia. Correlation with clinical exam and further evaluation with CT angiography or MRI recommended. Right occipital old infarct and encephalomalacia. No acute/traumatic cervical spine pathology. These results were called by telephone at the time of interpretation on 05-02-2016 at 1:53 am to Dr.Stewart, who verbally acknowledged these results. Electronically Signed   By: Elgie Collard M.D.   On: 05/02/16 01:57   Ct Angio Neck W Or Wo Contrast  2016/05/02  CLINICAL DATA:  RIGHT facial droop, slurred speech. Headache and syncopal episode. History of hypertension, diabetes, CHF. EXAM: CT ANGIOGRAPHY HEAD AND NECK TECHNIQUE: Multidetector CT imaging of the head and neck was performed using the standard protocol during bolus administration of intravenous contrast. Multiplanar CT image reconstructions and MIPs were obtained to evaluate the vascular anatomy. Carotid stenosis measurements (when applicable) are obtained utilizing NASCET criteria, using the distal internal carotid diameter as the denominator. Multidetector CT imaging of the head was performed using the standard protocol during bolus administration of intravenous contrast. Multiplanar CT image reconstructions and MIPs were obtained to evaluate the vascular anatomy. Automated contrast tree during at failed, technologist may annually administered bolus, with delayed, non angiographic phase. CONTRAST:  50 cc Isovue 370 COMPARISON:  CT HEAD 05/02/2016 at 0134 hours FINDINGS: Manual contrast administration results in non angiographic phase. Extremely limited evaluation. CTA NECK Aortic arch: Normal appearance of the thoracic arch, 2 vessel arch is a normal variant. No definite stenosis arch  vessel origins though limited assessment. Right carotid system: Common carotid artery is widely patent, coursing in a straight line fashion. RIGHT internal carotid artery is occluded distal to the origin though, limited evaluation. Left carotid system: Common carotid artery is widely patent, coursing in a straight line fashion. LEFT internal carotid artery is patent. Vertebral arteries:Vertebral arteries are patent. Skeleton: No acute osseous process though bone windows have not been submitted. Bulky calcified stylohyoid ligament results in susceptibility artifact. Dental prosthesis results in susceptibility artifact. Other neck: Soft tissues of the neck are non-acute though, not tailored for evaluation. Small RIGHT greater than LEFT pleural effusions. CTA HEAD Anterior circulation: RIGHT internal carotid artery is occluded throughout the course. Due to bolus timing, at isodense appearance of the cavernous sinus and cavernous internal carotid arteries. RIGHT internal carotid artery remains occluded to the terminus. LEFT internal carotid artery is patent. Poor contrast opacification of RIGHT anterior and RIGHT middle cerebral arteries. LEFT anterior middle cerebral arteries are patent though limited evaluation. Posterior circulation: Vertebral arteries and basilar artery patent. Poorly visualized mid to distal RIGHT posterior cerebral artery conforming to prior stroke. LEFT posterior cerebral artery appears patent. IMPRESSION: Extremely limited assessment due to technical difficulties. Non angiographic phase. In addition, streak artifact from bulky calcified stylohyoid ligaments  limits evaluation of the carotid bifurcations . RIGHT internal artery occlusion to the level of the carotid terminus with minimal contrast within RIGHT anterior and middle cerebral artery. Bilateral pleural effusions. Acute findings discussed with and reconfirmed by Dr.CHARLES STEWART on 04-16-2016 at 2:45 am. Electronically Signed   By:  Awilda Metro M.D.   On: 04-16-16 02:57   Ct Cervical Spine Wo Contrast  2016/04/16  CLINICAL DATA:  55 year old male with code stroke and syncopal episode. EXAM: CT HEAD WITHOUT CONTRAST CT CERVICAL SPINE WITHOUT CONTRAST TECHNIQUE: Multidetector CT imaging of the head and cervical spine was performed following the standard protocol without intravenous contrast. Multiplanar CT image reconstructions of the cervical spine were also generated. COMPARISON:  Head CT dated 11/22/2015 FINDINGS: CT HEAD FINDINGS Evaluation of this exam is limited due to motion artifact. The ventricles and sulci are appropriate in size for patient's age. Mild periventricular and deep white matter chronic microvascular ischemic changes noted. There is a large area of old infarct and encephalomalacia involving the right occipital lobe which is new compared the prior study dated 11/22/2015. There is no acute intracranial hemorrhage. There is a 4 mm segment of high attenuation involving the right MCA which is new compared to the prior study and may represent a right MCA thrombus. There is apparent asymmetric prominence of the right centrum semiovale white matter which may represent mild underlying edema. The visualized paranasal sinuses and mastoid air cells are clear. The calvarium is intact. CT CERVICAL SPINE FINDINGS There is no acute fracture or subluxation of the cervical spine.Multilevel degenerative changes with osteophyte formation.The odontoid and spinous processes are intact.There is normal anatomic alignment of the C1-C2 lateral masses. The visualized soft tissues appear unremarkable. Partially visualized bilateral pleural effusions. IMPRESSION: No acute intracranial hemorrhage. Focal high attenuation in the M1 segment of the right MCA with possible mild edema in the right centrum semiovale. Findings concerning for right MCA thrombus and possible right MCA territory acute ischemia. Correlation with clinical exam and further  evaluation with CT angiography or MRI recommended. Right occipital old infarct and encephalomalacia. No acute/traumatic cervical spine pathology. These results were called by telephone at the time of interpretation on 2016/04/16 at 1:53 am to Dr.Stewart, who verbally acknowledged these results. Electronically Signed   By: Elgie Collard M.D.   On: 16-Apr-2016 01:57   Dg Chest Port 1 View  04/16/16  CLINICAL DATA:  Acute onset of shortness of breath. Initial encounter. EXAM: PORTABLE CHEST 1 VIEW COMPARISON:  CTA of the neck performed earlier today at 2:08 a.m., and chest radiograph performed 02/07/2016 FINDINGS: Known small right and trace left pleural effusions are not well seen on this study. Mild right perihilar opacity could reflect pneumonia. No pneumothorax is seen. The cardiomediastinal silhouette is mildly enlarged. No acute osseous abnormalities identified. IMPRESSION: 1. Mild right perihilar opacity raises question for pneumonia. 2. Known small right and trace left pleural effusions are not well seen on this study. 3. Mild cardiomegaly. Electronically Signed   By: Roanna Raider M.D.   On: 04/16/16 03:12   STUDIES:  6/21 CT head >> high attenuation M1 of Rt MCA, old Rt occipital infarct 6/21 CT angio head/neck >> Rt internal carotid artery occlusion to level of carotid terminus  CULTURES: 6/21 Urine >>   ANTIBIOTICS:  SIGNIFICANT EVENTS: 6/21 Admit  LINES/TUBES: ETT 6/21>>>  DISCUSSION: 55 yo male smoker with slurred speech, Rt gaze, Lt sided weakness from Rt MCA CVA.  UDS positive for cocaine, hyperglycemia with CBG >  600, hematemesis.  PCCM asked to assist with medical management in ICU.  He has hx of CVA, CAD, ischemic CM (EF 40%), A fib on xarelto, mod MR, HTN, DM, GERD, OSA.  ASSESSMENT / PLAN:  NEUROLOGIC A:   Acute Rt MCA CVA with hx of prior CVA. L hemiplegia Cocaine abuse.  P:   Per neurology, and neuro-IR Cervical collar in place, no true fall, low threshold  to DC if able to clear.  PULMONARY A: Hx of OSA, tobacco abuse.  P:   Full vent support. Monitor oxygenation. Prn BDs. Bronchial hygiene. Titrate O2 for sat of 88-92%.  CARDIOVASCULAR A:  HTN urgency Hx of CAD, chronic systolic CHF, HTN, A fib, mod MR, HLD.  P:  Resume amiodarone, lipitor, digoxin, diltiazem, lasix, imdur, lisinopril when able to swallow. Nicardipine to keep SBP < 170, DBP < 105. PRN hydralazine. No beta-blocker in setting cocaine. Hold outpt xarelto and plavix with concern for GI bleed. SBP 120-140 after neuro IR intervention.  RENAL A:   CKD 2.  P:   Monitor renal fx, urine outpt  GASTROINTESTINAL A:   Hematemesis episode in ER 6/21. Hx of GERD. Dysphagia.  P:   Consult nutrition for TF as per nutrition. Pepcid Monitor for further hematemesis, xarelto and plavix on hold. BP improved.   HEMATOLOGIC A:   Chronic anticoagulation in setting AF (on xarelto)  P:  F/u CBC SCDs Anticoagulation per neurology  INFECTIOUS A:   No evidence for infection.  P:   Monitor clinically Hold abx.  ENDOCRINE A:   Hyperglycemia with hx of DM2.    P:   Check beta-hydroxybutyric acid. KVO IVF. Insulin gtt started in ED.  FAMILY  - Updates: No family bedside.  - Inter-disciplinary family meet or Palliative Care meeting due by:  6/28  Joneen Roach, AGACNP-BC [place] [facility] Pager [phone] or [phone]  Attending Note:  55 year old male with PMH of cocaine abuse presenting with an ischemic CVA with very large hemorrhagic conversion.  Now unresponsive on the vent.  Will continue full vent support for now.  ABG now and adjust vent.  Begin TF per nutrition.  Will titrate for SBP of 120-140.  Await neuro's lead on prognosis.  Above note edited in full.  The patient is critically ill with multiple organ systems failure and requires high complexity decision making for assessment and support, frequent evaluation and  titration of therapies, application of advanced monitoring technologies and extensive interpretation of multiple databases.   Critical Care Time devoted to patient care services described in this note is  35  Minutes. This time reflects time of care of this signee Dr Koren Bound. This critical care time does not reflect procedure time, or teaching time or supervisory time of PA/NP/Med student/Med Resident etc but could involve care discussion time.  Alyson Reedy, M.D. [facility] Pulmonary/Critical Care Medicine. Pager: [phone]. After hours pager: [phone].   04/13/2016 5:00 AM

## 2016-04-14 NOTE — ED Notes (Signed)
CBG is 582.

## 2016-04-14 NOTE — Progress Notes (Signed)
eLink Physician-Brief Progress Note Patient Name: Sean Hinton DOB: 1961-07-07 MRN: [id number]   Date of Service  April 25, 2016  HPI/Events of Note  Increase Urine output - Has put out 1350 mL since 7 PM. Na+ has gone from 136 >> 147. Central DI?? Patient admitted with ischemic CVA s/p IR thrombectomy.  eICU Interventions  Will order: 1. Serum and urine osmolality now.  2. BMP now.      Intervention Category Intermediate Interventions: Other: Minor Interventions: Clinical assessment - ordering diagnostic tests  Lenell Antu 04/25/2016, 10:28 PM

## 2016-04-14 NOTE — Progress Notes (Signed)
SLP Cancellation Note  Patient Details Name: Sean Hinton MRN: [id number] DOB: 06-Aug-1961   Cancelled treatment:       Reason Eval/Treat Not Completed: Patient not medically ready. Will follow for readiness   Claudine Mouton May 04, 2016, 9:43 AM

## 2016-04-14 NOTE — ED Notes (Signed)
Patient at home with spouse, had syncope episode with fall. Called EMS. EMS arrived, stabilized [facility]. Stated patient has right side gaze preference, right side headache, slurred speech, left side flaccid and CBG >600. IV placed by EMS en route. Patient has hx of CHF, DM, cocaine use. Spouse unsure if patient is taking home medications

## 2016-04-14 NOTE — ED Notes (Addendum)
Critical care at [facility], Dr. Roseanne Reno (neuro) at [facility].

## 2016-04-14 NOTE — Transfer of Care (Signed)
Immediate Anesthesia Transfer of Care Note  Patient: Sean Hinton  Procedure(s) Performed: Procedure(s): RADIOLOGY WITH ANESTHESIA (N/A)  Patient Location: ICU  Anesthesia Type:General  Level of Consciousness: sedated, unresponsive and Patient remains intubated per anesthesia plan  Airway & Oxygen Therapy: Patient remains intubated per anesthesia plan and Patient placed on Ventilator (see vital sign flow sheet for setting)  Post-op Assessment: Report given to RN and Post -op Vital signs reviewed and stable  Post vital signs: Reviewed and stable  Last Vitals:  Filed Vitals:   April 18, 2016 0520 04/12/2016 0525  BP: 138/96 145/93  Pulse: 65 105  Resp: 32 21    Last Pain:  Filed Vitals:   April 18, 2016 0557  PainSc: 10-Worst pain ever         Complications: No apparent anesthesia complications

## 2016-04-14 NOTE — Telephone Encounter (Signed)
New message    Exia called to state pt had a stroke he is at [facility]  And she wants someone to come see him in the hospital   I told her she can contact Rosann Auerbach

## 2016-04-14 NOTE — Progress Notes (Signed)
Echocardiogram 2D Echocardiogram has been performed.  Sean Hinton 05/06/2016, 2:33 PM

## 2016-04-14 NOTE — Addendum Note (Signed)
Addendum  created 03/31/2016 1854 by Sheppard Evens, CRNA   Modules edited: Anesthesia Medication Administration

## 2016-04-14 NOTE — Progress Notes (Addendum)
[facility] pager notified that family wanted to see cardiology. We have not been formally consulted - asked nurse to d/w primary team taking care of patient as we don't typically consult based on family request. Would be happy to see if formal consultation is requested. Zriyah Kopplin PA-C

## 2016-04-14 NOTE — Progress Notes (Signed)
STROKE TEAM PROGRESS NOTE   HISTORY OF PRESENT ILLNESS (per record) Sean Hinton is an 55 y.o. male history diabetes mellitus, hypertension, hyperlipidemia, atrial fibrillation on Xaralto, coronary artery disease, previous stroke and tobacco and cocaine abuse, brought to the emergency room and code stroke status following acute onset of weakness involving Sean left side, as well as numbness and neglect. Patient began complaining of numbness involving Sean left arm then lost consciousness briefly. When he woke up he was noted to have left facial droop, garbled speech and inability to move Sean left extremities. He was last known well at 12:45 AM today May 14, 2016. CT scan of Sean head showed old right PCA territory stroke. There were no acute changes, including no intracranial hemorrhage. NIH stroke score was 21. Patient was not deemed a candidate for TPA because he takes Xarelto. Sean INR was 1.45. Patient has also been on Plavix 75 mg per day. CT angiogram was somewhat inconclusive for intracranial large vessel occlusion but indicated right MCA and ACA flow abnormalities, in addition to right ICA occlusion demonstrated on cervical CTA. Patient's blood sugar was markedly elevated at 724. IV insulin drip was started. Blood pressure was also elevated requiring intervention with hydralazine. Urine drug screen was positive for cocaine.   Interventional radiologist was consulted and agreed to perform catheter cerebral angiogram and possible endovascular revascularization procedures, but after blood sugar was under better controlled. At 4:30 AM patient's blood sugar was 506. He had cerebral angiogram with complete revascularization of occluded R ICA terminuswith solitaire and IA integrelin, TICI2b revascularization of R MCA. ? Vasospasm R ACA and MCA. He was admitted to the ICU for further evaluation and treatment.   SUBJECTIVE (INTERVAL HISTORY) Sean Hinton(and medical POA) is at the bedside. He has an estranged  daughter who she is trying to get here as well as brothers and sisters. Hinton, who is the POA, is starting to understand the gravity of Sean stroke. She is not sure whether pt is taking Xarelto and plavix but she stated that pt does take medication everyday, but for sure he takes lasix everyday. Pt INR 1.45 and APTT 30 on admission, seems not taking Xarelto the day on admission at least. Sean UDS again cocaine positive.   OBJECTIVE Pulse Rate:  [65-116] 105 (06/21 0525) Cardiac Rhythm:  [-] Sinus tachycardia (06/21 0441) Resp:  [12-42] 21 (06/21 0525) BP: (122-150)/(81-124) 145/93 mmHg (06/21 0525) SpO2:  [81 %-100 %] 100 % (06/21 0807) FiO2 (%):  [100 %] 100 % (06/21 0807) Weight:  [75 kg (165 lb 5.5 oz)-78.2 kg (172 lb 6.4 oz)] 78.2 kg (172 lb 6.4 oz) (06/21 0144)  CBC:  Recent Labs Lab 2016-05-14 0143 2016/05/14 0150  WBC 8.4  --   NEUTROABS 5.9  --   HGB 13.1 15.3  HCT 40.1 45.0  MCV 83.2  --   PLT 164  --     Basic Metabolic Panel:  Recent Labs Lab May 14, 2016 0143 May 14, 2016 0150  NA 130* 133*  K 3.8 3.8  CL 98* 98*  CO2 23  --   GLUCOSE 724* 664*  BUN 17 20  CREATININE 1.72* 1.50*  CALCIUM 8.6*  --     Lipid Panel:     Component Value Date/Time   CHOL 169 02/06/2016 0601   TRIG 71 05/14/2016 0924   HDL 58 02/06/2016 0601   CHOLHDL 2.9 02/06/2016 0601   VLDL 20 02/06/2016 0601   LDLCALC 91 02/06/2016 0601   HgbA1c:  Lab Results  Component Value  Date   HGBA1C 13.4* 01/26/2016   Urine Drug Screen:    Component Value Date/Time   LABOPIA NONE DETECTED 04/04/2016 0249   COCAINSCRNUR POSITIVE* 04/03/2016 0249   LABBENZ NONE DETECTED 04/17/2016 0249   AMPHETMU NONE DETECTED 04/06/2016 0249   THCU NONE DETECTED 04/22/2016 0249   LABBARB NONE DETECTED 04/05/2016 0249      IMAGING  Ct Head Wo Contrast 04/15/2016  1. Widespread new abnormal right MCA territory gyral edema with dense post intervention hyperenhancement +/- parenchymal hemorrhage. 2. Associated new  intracranial mass effect with mild effacement of the right lateral ventricle and 2-3 mm of leftward midline shift.  04/07/2016   No acute intracranial hemorrhage. Focal high attenuation in the M1 segment of the right MCA with possible mild edema in the right centrum semiovale. Findings concerning for right MCA thrombus and possible right MCA territory acute ischemia. Right occipital old infarct and encephalomalacia.   Ct Angio Head & Neck W Or Wo Contrast 04/17/2016  Extremely limited assessment due to technical difficulties. Non angiographic phase. In addition, streak artifact from bulky calcified stylohyoid ligaments limits evaluation of the carotid bifurcations . RIGHT internal artery occlusion to the level of the carotid terminus with minimal contrast within RIGHT anterior and middle cerebral artery. Bilateral pleural effusions.   Ct Cervical Spine Wo Contrast 03/30/2016    No acute/traumatic cervical spine pathology.   Cerebral angiogram 03/30/2016 Bilateral common carotid arteriograms and RT vert artery angiogram followed by endovascular complete revascularizationof occluded RT ICA prox and distally at the terminus ,the RT MCA and RT ACA with x 1pass with Solitaire FR 4 mmx 40 mm Retrieval device and 18m,g of IA integrelin intracranially..Complete revascularization of RT ICA RT MCA TICI 2b revascularization Also noted scattered smooth areas of narrowing of MCA and ACA branches ?vasospasm chemical related  Dg Chest Port 1 View 03/30/2016   1. Mild right perihilar opacity raises question for pneumonia. 2. Known small right and trace left pleural effusions are not well seen on this study. 3. Mild cardiomegaly.   TTE - - Left ventricle: Inferior wall hypokineiss apical and septal  akinesis The cavity size was moderately dilated. Wall thickness  was normal. Systolic function was moderately to severely reduced.  The estimated ejection fraction was in the range of 30% to 35%.  Left  ventricular diastolic function parameters were normal. - Mitral valve: Eccentric ischemic MR. There was severe  regurgitation. - Left atrium: The atrium was moderately to severely dilated. - Right ventricle: The cavity size was mildly dilated. - Right atrium: The atrium was mildly dilated. - Atrial septum: No defect or patent foramen ovale was identified. - Pulmonary arteries: PA peak pressure: 45 mm Hg (S).  MRI and MRA pending   PHYSICAL EXAM Physical exam  Temp:  [95.4 F (35.2 C)-97.9 F (36.6 C)] 97.9 F (36.6 C) (06/21 1554) Pulse Rate:  [65-116] 87 (06/21 1554) Resp:  [12-42] 20 (06/21 1554) BP: (111-150)/(81-124) 123/97 mmHg (06/21 1300) SpO2:  [81 %-100 %] 99 % (06/21 1554) FiO2 (%):  [40 %-100 %] 40 % (06/21 1554) Weight:  [165 lb 5.5 oz (75 kg)-182 lb 1.6 oz (82.6 kg)] 182 lb 1.6 oz (82.6 kg) (06/21 0844)  General - Well nourished, well developed, intubated and sedated.  Ophthalmologic - Fundi not visualized due to ET positioning.  Cardiovascular - Regular rate and rhythm.  Neuro - intubated and sedated, not open eyes or following any commands. Eyes middle position, no doll's eyes, pupils 2.33mm, not responsive to  light, no corneal, but positive gag or cough and breathing over the vent. On pain stimulation, trace withdraw in all 4 extremities. No babinski and DTR diminished. Sensation, coordination or gait not tested.   ASSESSMENT/PLAN Sean Hinton is a 55 y.o. male with history of diabetes mellitus, hypertension, hyperlipidemia, atrial fibrillation on Xaralto, coronary artery disease, previous stroke and tobacco and cocaine abuse presenting with left sided weakness, numbness and neglect.  He did not receive IV t-PA due to being on xarelto. He was taken to IR where he had complete revascularization of occluded R ICA terminus and, TICI2b revascularization of R MCA.    Stroke:  Non-dominant right ICA infarct, likely embolic given hx atrial fibrillation on xarelto  vs cardiomyopathy d/t cocaine use vs. Carotid stenosis. S/p revascularization of occluded R ICA terminus, and TICI2b revascularization of R MCA.    CTA H&N right ICA occlusion with minimal contrast right ACA and MCA. Bilateral pleural effusions.  Post IR CT with widespread right MCA gyral edema with likely dense contrast staining (vs hemorrage). New mass effect with 3 mm left shift.   MRI  pending  MRA head pending  2D Echo  EF 30-35%  LDL pending  HgbA1c pending  SCDs for VTE prophylaxis  Diet NPO time specified  clopidogrel 75 mg daily and Xarelto (rivaroxaban) daily prior to admission, now on No antithrombotic due to questionable hemorrhagic transformation on CT.   Ongoing aggressive stroke risk factor management  Change IVF to D5NS if dextrose  needed to prevent cerebral edema  Therapy recommendations:  pending   Disposition:  pending   Respiratory failure  Intubated for neurointervention  On propofol  CCM managing  Atrial Fibrillation  Home anticoagulation:  Xarelto (rivaroxaban) daily   Not sure about compliance  Currently off AC due to concerns of hemorrhagic transformation  If MRI showed no hemorrhage, will consider heparin drip due to potential surgical procedures  Cardiomyopathy   EF 30-35%  Follows with cardiology for CHF  Poor prognosis due to continued cocaine use as per cardiology notes  On Xarelto at home  Will start heparin IV if hemorrhage ruled out  CAD s/p CABG and stent  MI, CABG 02/2015, stent 10/2015  Follows with cardiology  On plavix  plavix on hold due to concerns of hemorrhagic transformation   Hypertensive Urgency  BP 149/124 on arrival in setting of neurologic symptoms  Started on cardene for BP control  SBP goal 120-140 post IR   Monitor   Relax BP goal 24 hr post IR  Hyperlipidemia  Home meds:  lipitor 80  LDL pending, goal < 70  Resumed li[pitor 80  Continue statin at discharge  Diabetes, poorly  controlled  Glucose > 600 in ED  On insulin gtt  HgbA1c pending , goal < 7.0  CCM on board  Other Stroke Risk Factors  Cigarette smoker  Chronic Cocaine abuse, UDS positive this admission  Family hx stroke (brother)  PVD  obstructive sleep apnea    Other Active Problems  Chronic kidney disease stage II  hematemesis in the ER on pepcid  Hospital day # 0   This patient is critically ill due to right ICA infarct s/p IR, afib on Xarelto, CAD/MI on plavix s/p CABG and stent, hyperglycemia on insulin drip and at significant risk of neurological worsening, death form hemorrhagic transformation, recurrent stroke, heart failure, DKA, brain herniation. This patient's care requires constant monitoring of vital signs, hemodynamics, respiratory and cardiac monitoring, review of multiple databases, neurological assessment,  discussion with family, other specialists and medical decision making of high complexity. I spent 45 minutes of neurocritical care time in the care of this patient.  Marvel Plan, MD PhD Stroke Neurology 2016/05/14 4:28 PM     To contact Stroke Continuity provider, please refer to [email or web address]. After hours, contact General Neurology

## 2016-04-14 NOTE — Care Management Note (Signed)
Case Management Note  Patient Details  Name: Sean Hinton MRN: [id number] Date of Birth: Sep 21, 1961  Subjective/Objective:   Pt admitted with stroke                 Action/Plan:  Pt is from home with wife.  Pt was positive on tox screen - CSW consulted.  CM will continue to monitor for disposition needs   Expected Discharge Date:                  Expected Discharge Plan:  Skilled Nursing Facility  In-House Referral:  Clinical Social Work  Discharge planning Services  CM Consult  Post Acute Care Choice:    Choice offered to:     DME Arranged:    DME Agency:     HH Arranged:    HH Agency:     Status of Service:  In process, will continue to follow  If discussed at Long Length of Stay Meetings, dates discussed:    Additional Comments:  Cherylann Parr, RN 04/11/2016, 2:16 PM

## 2016-04-14 NOTE — Progress Notes (Signed)
eLink Physician-Brief Progress Note Patient Name: Sean Hinton DOB: 07-06-1961 MRN: [id number]   Date of Service  04/19/2016  HPI/Events of Note  K+ = 3.0 and Creatinine = 1.04.  eICU Interventions  Will replete K+.     Intervention Category Intermediate Interventions: Electrolyte abnormality - evaluation and management  Erica Osuna Eugene 03/27/2016, 3:47 PM

## 2016-04-14 NOTE — Progress Notes (Signed)
Initial Nutrition Assessment  DOCUMENTATION CODES:   Not applicable  INTERVENTION:    Initiate TF via OGT with Vital AF 1.2 at goal rate of 50 ml/h (1200 ml per day) and Prostat 30 ml BID to provide 1640 kcals, 120 gm protein, 973 ml free water daily.  Total calorie intake with TF and Propofol will be 1814 kcals per day.  NUTRITION DIAGNOSIS:   Inadequate oral intake related to inability to eat as evidenced by NPO status.  GOAL:   Patient will meet greater than or equal to 90% of their needs  MONITOR:   Vent status, Labs, Weight trends, TF tolerance, I & O's  REASON FOR ASSESSMENT:   Consult Enteral/tube feeding initiation and management  ASSESSMENT:   55 yo male smoker had syncopal episode at home witness by his wife. He had Rt sided headache. He was noted to have Rt sided gaze, slurred speech, and Lt side flaccid. His blood sugar was > 600. His significant other was not sure if he has been taking his medications. Code stroke activated, and he was seen by neurology. His UDS was positive for cocaine. He had episode of vomiting blood in the ER. He was started on insulin gtt.  Discussed patient in ICU rounds and with RN today. Received MD Consult for TF initiation and management. Noted reports of weight loss on admission.  Unable to complete Nutrition-Focused physical exam at this time.  Patient is currently intubated on ventilator support MV: 8.7 L/min Temp (24hrs), Avg:95.7 F (35.4 C), Min:95.4 F (35.2 C), Max:96.4 F (35.8 C)  Propofol: 6.6 ml/hr providing 174 kcals per day.   Diet Order:  Diet NPO time specified  Skin:  Reviewed, no issues  Last BM:  unknown  Height:   Ht Readings from Last 1 Encounters:  April 28, 2016 5\' 9"  (1.753 m)    Weight:   Wt Readings from Last 1 Encounters:  04-28-16 182 lb 1.6 oz (82.6 kg)    Ideal Body Weight:  72.7 kg  BMI:  Body mass index is 26.88 kg/(m^2).  Estimated Nutritional Needs:   Kcal:   1840  Protein:  115-130 gm  Fluid:  >/= 2 L  EDUCATION NEEDS:   No education needs identified at this time  04/16/16, RD, LDN, CNSC Pager [phone] After Hours Pager [phone]

## 2016-04-14 NOTE — ED Notes (Signed)
Report received, care assumed family and IV team at [facility].

## 2016-04-14 NOTE — Anesthesia Preprocedure Evaluation (Addendum)
Anesthesia Evaluation  Patient identified by MRN, date of birth, ID band Patient confused    Reviewed: Unable to perform ROS - Chart review only  History of Anesthesia Complications Negative for: history of anesthetic complications  Airway Mallampati: II  TM Distance: >3 FB     Dental  (+) Poor Dentition   Pulmonary Current Smoker,    Pulmonary exam normal breath sounds clear to auscultation       Cardiovascular hypertension, Pt. on medications + CAD, + Past MI, + Peripheral Vascular Disease and +CHF   Rhythm:Regular Rate:Tachycardia  09/2015 Echo: EF 35-40%.   Neuro/Psych Anxiety    GI/Hepatic GERD  ,(+)     substance abuse  cocaine use,   Endo/Other  diabetes, Type 1, Insulin Dependent  Renal/GU Renal InsufficiencyRenal disease     Musculoskeletal  (+) Arthritis ,   Abdominal   Peds  Hematology   Anesthesia Other Findings   Reproductive/Obstetrics                          Anesthesia Physical Anesthesia Plan  ASA: IV and emergent  Anesthesia Plan: General   Post-op Pain Management:    Induction: Intravenous, Rapid sequence and Cricoid pressure planned  Airway Management Planned: Oral ETT  Additional Equipment: Arterial line  Intra-op Plan:   Post-operative Plan: Post-operative intubation/ventilation  Informed Consent: I have reviewed the patients History and Physical, chart, labs and discussed the procedure including the risks, benefits and alternatives for the proposed anesthesia with the patient or authorized representative who has indicated his/her understanding and acceptance.   History available from chart only and Only emergency history available  Plan Discussed with: Anesthesiologist and Surgeon  Anesthesia Plan Comments:       Anesthesia Quick Evaluation

## 2016-04-15 ENCOUNTER — Inpatient Hospital Stay (HOSPITAL_COMMUNITY): Payer: Medicaid Other

## 2016-04-15 ENCOUNTER — Encounter (HOSPITAL_COMMUNITY): Payer: Self-pay | Admitting: Interventional Radiology

## 2016-04-15 DIAGNOSIS — E1311 Other specified diabetes mellitus with ketoacidosis with coma: Secondary | ICD-10-CM

## 2016-04-15 DIAGNOSIS — G936 Cerebral edema: Secondary | ICD-10-CM

## 2016-04-15 DIAGNOSIS — Z794 Long term (current) use of insulin: Secondary | ICD-10-CM

## 2016-04-15 DIAGNOSIS — E870 Hyperosmolality and hypernatremia: Secondary | ICD-10-CM

## 2016-04-15 DIAGNOSIS — G935 Compression of brain: Secondary | ICD-10-CM

## 2016-04-15 DIAGNOSIS — I161 Hypertensive emergency: Secondary | ICD-10-CM

## 2016-04-15 DIAGNOSIS — I63311 Cerebral infarction due to thrombosis of right middle cerebral artery: Secondary | ICD-10-CM

## 2016-04-15 DIAGNOSIS — E232 Diabetes insipidus: Secondary | ICD-10-CM

## 2016-04-15 LAB — BLOOD GAS, ARTERIAL
Acid-Base Excess: 2.8 mmol/L — ABNORMAL HIGH (ref 0.0–2.0)
Acid-base deficit: 3 mmol/L — ABNORMAL HIGH (ref 0.0–2.0)
BICARBONATE: 22.2 meq/L (ref 20.0–24.0)
BICARBONATE: 27.2 meq/L — AB (ref 20.0–24.0)
DRAWN BY: 244861
Drawn by: 36496
FIO2: 100
FIO2: 0.4
LHR: 14 {breaths}/min
MECHVT: 620 mL
O2 Saturation: 99.4 %
O2 Saturation: 96.8 %
PATIENT TEMPERATURE: 98.6
PATIENT TEMPERATURE: 98.6
PCO2 ART: 45.3 mmHg — AB (ref 35.0–45.0)
PCO2 ART: 44.9 mmHg (ref 35.0–45.0)
PEEP/CPAP: 5 cmH2O
PEEP: 5 cmH2O
PH ART: 7.312 — AB (ref 7.350–7.450)
PH ART: 7.4 (ref 7.350–7.450)
PO2 ART: 233 mmHg — AB (ref 80.0–100.0)
RATE: 14 resp/min
TCO2: 23.6 mmol/L (ref 0–100)
TCO2: 28.6 mmol/L (ref 0–100)
VT: 610 mL
pO2, Arterial: 91.5 mmHg (ref 80.0–100.0)

## 2016-04-15 LAB — LIPID PANEL
Cholesterol: 176 mg/dL (ref 0–200)
HDL: 47 mg/dL (ref >40)
LDL CALC: 115 mg/dL — AB (ref 0–99)
Total CHOL/HDL Ratio: 3.7 RATIO
Triglycerides: 68 mg/dL (ref <150)
VLDL: 14 mg/dL (ref 0–40)

## 2016-04-15 LAB — GLUCOSE, CAPILLARY
GLUCOSE-CAPILLARY: 146 mg/dL — AB (ref 65–99)
GLUCOSE-CAPILLARY: 147 mg/dL — AB (ref 65–99)
GLUCOSE-CAPILLARY: 155 mg/dL — AB (ref 65–99)
GLUCOSE-CAPILLARY: 165 mg/dL — AB (ref 65–99)
GLUCOSE-CAPILLARY: 181 mg/dL — AB (ref 65–99)
GLUCOSE-CAPILLARY: 193 mg/dL — AB (ref 65–99)
GLUCOSE-CAPILLARY: 322 mg/dL — AB (ref 65–99)
Glucose-Capillary: 185 mg/dL — ABNORMAL HIGH (ref 65–99)
Glucose-Capillary: 195 mg/dL — ABNORMAL HIGH (ref 65–99)
Glucose-Capillary: 102 mg/dL — ABNORMAL HIGH (ref 65–99)
Glucose-Capillary: 131 mg/dL — ABNORMAL HIGH (ref 65–99)
Glucose-Capillary: 137 mg/dL — ABNORMAL HIGH (ref 65–99)
Glucose-Capillary: 137 mg/dL — ABNORMAL HIGH (ref 65–99)
Glucose-Capillary: 405 mg/dL — ABNORMAL HIGH (ref 65–99)

## 2016-04-15 LAB — URINE CULTURE: Culture: NO GROWTH

## 2016-04-15 LAB — CBC WITH DIFFERENTIAL/PLATELET
BASOS ABS: 0 10*3/uL (ref 0.0–0.1)
Basophils Relative: 0 %
EOS PCT: 0 %
Eosinophils Absolute: 0.1 10*3/uL (ref 0.0–0.7)
HEMATOCRIT: 43.4 % (ref 39.0–52.0)
Hemoglobin: 13.9 g/dL (ref 13.0–17.0)
LYMPHS PCT: 15 %
Lymphs Abs: 2 10*3/uL (ref 0.7–4.0)
MCH: 27.1 pg (ref 26.0–34.0)
MCHC: 32 g/dL (ref 30.0–36.0)
MCV: 84.6 fL (ref 78.0–100.0)
Monocytes Absolute: 1.4 10*3/uL — ABNORMAL HIGH (ref 0.1–1.0)
Monocytes Relative: 10 %
NEUTROS ABS: 10.3 10*3/uL — AB (ref 1.7–7.7)
NEUTROS PCT: 75 %
PLATELETS: 144 10*3/uL — AB (ref 150–400)
RBC: 5.13 MIL/uL (ref 4.22–5.81)
RDW: 15.5 % (ref 11.5–15.5)
WBC: 13.9 10*3/uL — AB (ref 4.0–10.5)

## 2016-04-15 LAB — HEPARIN LEVEL (UNFRACTIONATED)

## 2016-04-15 LAB — BASIC METABOLIC PANEL
BUN: 10 mg/dL (ref 6–20)
BUN: 9 mg/dL (ref 6–20)
BUN: 8 mg/dL (ref 6–20)
CALCIUM: 9.3 mg/dL (ref 8.9–10.3)
CO2: 27 mmol/L (ref 22–32)
CO2: 31 mmol/L (ref 22–32)
CO2: 28 mmol/L (ref 22–32)
CREATININE: 1.32 mg/dL — AB (ref 0.61–1.24)
Calcium: 9.6 mg/dL (ref 8.9–10.3)
Calcium: 10 mg/dL (ref 8.9–10.3)
Creatinine, Ser: 1.11 mg/dL (ref 0.61–1.24)
Creatinine, Ser: 1.39 mg/dL — ABNORMAL HIGH (ref 0.61–1.24)
GFR calc Af Amer: >60 mL/min (ref >60)
GFR calc Af Amer: >60 mL/min (ref >60)
GFR calc Af Amer: >60 mL/min (ref >60)
GFR calc non Af Amer: >60 mL/min (ref >60)
GFR calc non Af Amer: 57 mL/min — ABNORMAL LOW (ref >60)
GLUCOSE: 187 mg/dL — AB (ref 65–99)
GLUCOSE: 406 mg/dL — AB (ref 65–99)
Glucose, Bld: 133 mg/dL — ABNORMAL HIGH (ref 65–99)
POTASSIUM: 3.7 mmol/L (ref 3.5–5.1)
POTASSIUM: 3.8 mmol/L (ref 3.5–5.1)
Potassium: 3.5 mmol/L (ref 3.5–5.1)
SODIUM: 175 mmol/L — AB (ref 135–145)
Sodium: 164 mmol/L (ref 135–145)
Sodium: 167 mmol/L (ref 135–145)

## 2016-04-15 LAB — PHOSPHORUS: Phosphorus: <1 mg/dL — CL (ref 2.5–4.6)

## 2016-04-15 LAB — MAGNESIUM: Magnesium: 2.3 mg/dL (ref 1.7–2.4)

## 2016-04-15 MED ORDER — DEXTROSE 5 % IV SOLN
INTRAVENOUS | Status: DC
  Administered 2016-04-15 – 2016-04-16 (×5): via INTRAVENOUS
  Administered 2016-04-17 (×2): 200 mL via INTRAVENOUS

## 2016-04-15 MED ORDER — SODIUM CHLORIDE 0.45 % IV SOLN: INTRAVENOUS | Status: DC

## 2016-04-15 MED ORDER — PHENYLEPHRINE HCL 10 MG/ML IJ SOLN
0.0000 ug/min | INTRAVENOUS | Status: DC
  Administered 2016-04-15: 175 ug/min via INTRAVENOUS
  Administered 2016-04-16: 200 ug/min via INTRAVENOUS
  Administered 2016-04-16: 150 ug/min via INTRAVENOUS
  Administered 2016-04-16 (×2): 200 ug/min via INTRAVENOUS
  Administered 2016-04-17: 250.133 ug/min via INTRAVENOUS
  Administered 2016-04-17: 200 ug/min via INTRAVENOUS
  Administered 2016-04-17: 250.133 ug/min via INTRAVENOUS
  Administered 2016-04-17: 200 ug/min via INTRAVENOUS
  Administered 2016-04-17 (×2): 250.133 ug/min via INTRAVENOUS
  Administered 2016-04-17 – 2016-04-18 (×2): 250 ug/min via INTRAVENOUS
  Filled 2016-04-15 (×17): qty 4

## 2016-04-15 MED ORDER — SODIUM CHLORIDE 0.9 % IV SOLN: INTRAVENOUS | Status: DC

## 2016-04-15 MED ORDER — DEXTROSE 5 % IV SOLN
INTRAVENOUS | Status: DC
  Administered 2016-04-15: 06:00:00 via INTRAVENOUS

## 2016-04-15 MED ORDER — INSULIN ASPART 100 UNIT/ML ~~LOC~~ SOLN
2.0000 [IU] | SUBCUTANEOUS | Status: DC
  Administered 2016-04-15: 4 [IU] via SUBCUTANEOUS
  Administered 2016-04-15 (×2): 6 [IU] via SUBCUTANEOUS
  Administered 2016-04-16 (×2): 4 [IU] via SUBCUTANEOUS
  Administered 2016-04-16 – 2016-04-17 (×4): 6 [IU] via SUBCUTANEOUS
  Administered 2016-04-17: 4 [IU] via SUBCUTANEOUS
  Administered 2016-04-17: 6 [IU] via SUBCUTANEOUS

## 2016-04-15 MED ORDER — INSULIN GLARGINE 100 UNIT/ML ~~LOC~~ SOLN
30.0000 [IU] | SUBCUTANEOUS | Status: DC
  Filled 2016-04-15: qty 0.3

## 2016-04-15 MED ORDER — DESMOPRESSIN ACETATE 4 MCG/ML IJ SOLN
1.0000 ug | Freq: Two times a day (BID) | INTRAMUSCULAR | Status: DC
  Administered 2016-04-15 – 2016-04-17 (×6): 1 ug via INTRAVENOUS
  Filled 2016-04-15 (×6): qty 1

## 2016-04-15 MED ORDER — INSULIN ASPART 100 UNIT/ML ~~LOC~~ SOLN
4.0000 [IU] | SUBCUTANEOUS | Status: DC
  Administered 2016-04-15 – 2016-04-17 (×9): 4 [IU] via SUBCUTANEOUS

## 2016-04-15 MED ORDER — INSULIN GLARGINE 100 UNIT/ML ~~LOC~~ SOLN
25.0000 [IU] | SUBCUTANEOUS | Status: DC
Start: 2016-04-15 — End: 2016-04-17
  Administered 2016-04-15 – 2016-04-17 (×3): 25 [IU] via SUBCUTANEOUS
  Filled 2016-04-15 (×3): qty 0.25

## 2016-04-15 MED ORDER — K PHOS MONO-SOD PHOS DI & MONO 155-852-130 MG PO TABS
500.0000 mg | ORAL_TABLET | Freq: Once | ORAL | Status: AC
Start: 2016-04-15 — End: 2016-04-15
  Administered 2016-04-15: 500 mg via ORAL
  Filled 2016-04-15: qty 2

## 2016-04-15 MED ORDER — DEXTROSE 10 % IV SOLN: INTRAVENOUS | Status: DC | PRN

## 2016-04-15 MED ORDER — DESMOPRESSIN ACETATE SPRAY 0.01 % NA SOLN
10.0000 ug | Freq: Two times a day (BID) | NASAL | Status: DC
  Administered 2016-04-15: 10 ug via NASAL
  Filled 2016-04-15: qty 5

## 2016-04-15 MED ORDER — DEXTROSE 5 % IV SOLN
0.0000 ug/min | INTRAVENOUS | Status: DC
  Administered 2016-04-15: 50 ug/min via INTRAVENOUS
  Filled 2016-04-15 (×2): qty 1

## 2016-04-15 MED ORDER — POTASSIUM PHOSPHATES 15 MMOLE/5ML IV SOLN
30.0000 mmol | Freq: Once | INTRAVENOUS | Status: AC
  Administered 2016-04-15: 30 mmol via INTRAVENOUS
  Filled 2016-04-15: qty 10

## 2016-04-15 MED ORDER — DEXTROSE-NACL 5-0.45 % IV SOLN
INTRAVENOUS | Status: DC
  Administered 2016-04-15: 1000 mL via INTRAVENOUS
  Administered 2016-04-15: 125 mL via INTRAVENOUS
  Administered 2016-04-15: 900 mL via INTRAVENOUS
  Administered 2016-04-15: 1000 mL via INTRAVENOUS

## 2016-04-15 NOTE — Progress Notes (Signed)
Transported patient to MRI and back without complication.

## 2016-04-15 NOTE — Progress Notes (Signed)
CRITICAL VALUE ALERT  Critical value received:  Na 175; Cl > 130  Date of notification:  04/15/16  Time of notification:  1300  Critical value read back:Yes.    Nurse who received alert:  Tory Emerald, RN  MD notified (1st page):  Dr Molli Knock  Time of first page:  1300; on floor  MD notified (2nd page):  Time of second page:  Responding MD:  Dr Molli Knock  Time MD responded:  1300; on floor

## 2016-04-15 NOTE — Progress Notes (Signed)
Referring Physician(s): Dr Marvel Plan  Supervising Physician: Julieanne Cotton  Patient Status:  Inpatient  Chief Complaint:   Procedures   CEREBRAL ANGIOGRAM [[id number] (Custom)]      Expand All Collapse All   Bilateral common carotid arteriograms and RT vert artery angiogram followed by endovascular complete revascularizationof occluded RT ICA prox and distally at the terminus ,the RT MCA and RT ACA with x 1pass with Solitaire FR 4 mmx 40 mm Retrieval device and 57m,g of IA integrelin intracranially..Complete revascularization of RT ICA RT MCA TICI 2b revascularization Also noted scattered smooth areas of narrowing of MCA and ACA branches ?vasospasm chemical related       Subjective:  On vent/intubated No response No movement  6/22 MR Brain IMPRESSION: 1. Large confluent ischemic infarct involving nearly the entirety of the right MCA territory with associated petechial hemorrhage throughout the area of infarction. 2. Markedly worsened right to left shift now measuring up to 16 mm with right uncal and parafalcine herniation. Findings suspicious for ventricular trapping involving the left lateral ventricle. 3. Additional ischemic infarcts involving the right ACA territory and bilateral PCA territories, including the midbrain and pons as well as the medial left thalamus, likely related to herniation. 4. Small volume patchy acute left MCA territory infarcts, with additional tiny right cerebellar infarct.   Allergies: Review of patient's allergies indicates no known allergies.  Medications: Prior to Admission medications   Medication Sig Start Date End Date Taking? Authorizing Provider  albuterol (PROVENTIL HFA;VENTOLIN HFA) 108 (90 Base) MCG/ACT inhaler Inhale 2 puffs into the lungs every 6 (six) hours as needed for wheezing or shortness of breath. 03/19/16  Yes Mihai Croitoru, MD  Blood Glucose Monitoring Suppl (TRUE METRIX METER) DEVI 1 Device by Does not  apply route 4 (four) times daily -  before meals and at bedtime. 01/30/16  Yes Jaclyn Shaggy, MD  clopidogrel (PLAVIX) 75 MG tablet Take 1 tablet (75 mg total) by mouth daily with breakfast. 01/30/16  Yes Jaclyn Shaggy, MD  Elastic Bandages & Supports (MEDICAL COMPRESSION STOCKINGS) MISC 20 mm Hg pressure 01/05/16  Yes Josalyn Funches, MD  furosemide (LASIX) 80 MG tablet Take 1 tablet by mouth 2 (two) times daily. 02/11/16  Yes Historical Provider, MD  glipiZIDE (GLUCOTROL) 10 MG tablet Take 10 mg by mouth 2 (two) times daily before a meal.   Yes Historical Provider, MD  glucose blood (TRUE METRIX BLOOD GLUCOSE TEST) test strip Use as instructed 01/30/16  Yes Jaclyn Shaggy, MD  hydrALAZINE (APRESOLINE) 25 MG tablet Take 25 mg by mouth 3 (three) times daily.   Yes Historical Provider, MD  Insulin Glargine (LANTUS SOLOSTAR) 100 UNIT/ML Solostar Pen Inject 20 Units into the skin daily at 10 pm. 01/29/16  Yes Albertine Grates, MD  ipratropium-albuterol (DUONEB) 0.5-2.5 (3) MG/3ML SOLN Take 3 mLs by nebulization 2 (two) times daily. 10/17/15  Yes Ripudeep Jenna Luo, MD  lisinopril (PRINIVIL,ZESTRIL) 10 MG tablet Take 1 tablet (10 mg total) by mouth daily. 02/10/16  Yes Azalee Course, PA  nitroGLYCERIN (NITROSTAT) 0.4 MG SL tablet Place 1 tablet (0.4 mg total) under the tongue every 5 (five) minutes as needed for chest pain (up to 3 doses). 02/25/15  Yes Dayna N Dunn, PA-C  potassium chloride SA (K-DUR,KLOR-CON) 20 MEQ tablet Take 1 tablet (20 mEq total) by mouth 2 (two) times daily. 01/29/16  Yes Azalee Course, PA  spironolactone (ALDACTONE) 25 MG tablet Take 25 mg by mouth 2 (two) times daily.   Yes Historical Provider,  MD  TRUEPLUS LANCETS 28G MISC 1 each by Does not apply route 4 (four) times daily -  before meals and at bedtime. 01/30/16  Yes Jaclyn Shaggy, MD  amiodarone (PACERONE) 200 MG tablet Take 1 tablet (200 mg total) by mouth daily. Patient not taking: Reported on April 26, 2016 12/01/15   Nita Sells Mikhail, DO  atorvastatin (LIPITOR) 80 MG  tablet Take 1 tablet (80 mg total) by mouth every evening. Patient not taking: Reported on Apr 26, 2016 10/17/15   Ripudeep Jenna Luo, MD  Dexlansoprazole (DEXILANT) 30 MG capsule Take 1 capsule (30 mg total) by mouth daily. Patient not taking: Reported on 04-26-2016 12/30/14   Dessa Phi, MD  digoxin (LANOXIN) 0.125 MG tablet Take 0.125 mg by mouth daily. Reported on 2016-04-26    Historical Provider, MD  diltiazem (DILACOR XR) 240 MG 24 hr capsule Take 240 mg by mouth daily. Reported on 26-Apr-2016    Historical Provider, MD  insulin aspart (NOVOLOG FLEXPEN) 100 UNIT/ML FlexPen Before each meal 3 times a day, 140-199 - 2 units, 200-250 - 4 units, 251-299 - 6 units,  300-349 - 8 units,  350 or above 10 units. Insulin PEN if approved, provide syringes and needles if needed. Patient not taking: Reported on 2016-04-26 01/29/16   Albertine Grates, MD  isosorbide mononitrate (IMDUR) 30 MG 24 hr tablet Take 1 tablet (30 mg total) by mouth daily. Patient not taking: Reported on 04/26/16 11/11/15   Jaclyn Shaggy, MD  rivaroxaban (XARELTO) 20 MG TABS tablet Take 1 tablet (20 mg total) by mouth daily with supper. Patient not taking: Reported on April 26, 2016 02/25/15   Dayna N Dunn, PA-C     Vital Signs: BP 111/81 mmHg  Pulse 108  Temp(Src) 98 F (36.7 C) (Oral)  Resp 14  Ht 5\' 9"  (1.753 m)  Wt 164 lb 0.4 oz (74.4 kg)  BMI 24.21 kg/m2  SpO2 100%  Physical Exam  Constitutional:  Intubated No response  Musculoskeletal:  No movement  Nursing note and vitals reviewed.   Imaging: Ct Angio Head W/cm &/or Wo Cm  04-26-2016  CLINICAL DATA:  RIGHT facial droop, slurred speech. Headache and syncopal episode. History of hypertension, diabetes, CHF. EXAM: CT ANGIOGRAPHY HEAD AND NECK TECHNIQUE: Multidetector CT imaging of the head and neck was performed using the standard protocol during bolus administration of intravenous contrast. Multiplanar CT image reconstructions and MIPs were obtained to evaluate the vascular anatomy.  Carotid stenosis measurements (when applicable) are obtained utilizing NASCET criteria, using the distal internal carotid diameter as the denominator. Multidetector CT imaging of the head was performed using the standard protocol during bolus administration of intravenous contrast. Multiplanar CT image reconstructions and MIPs were obtained to evaluate the vascular anatomy. Automated contrast tree during at failed, technologist may annually administered bolus, with delayed, non angiographic phase. CONTRAST:  50 cc Isovue 370 COMPARISON:  CT HEAD 26-Apr-2016 at 0134 hours FINDINGS: Manual contrast administration results in non angiographic phase. Extremely limited evaluation. CTA NECK Aortic arch: Normal appearance of the thoracic arch, 2 vessel arch is a normal variant. No definite stenosis arch vessel origins though limited assessment. Right carotid system: Common carotid artery is widely patent, coursing in a straight line fashion. RIGHT internal carotid artery is occluded distal to the origin though, limited evaluation. Left carotid system: Common carotid artery is widely patent, coursing in a straight line fashion. LEFT internal carotid artery is patent. Vertebral arteries:Vertebral arteries are patent. Skeleton: No acute osseous process though bone windows have not been submitted.  Bulky calcified stylohyoid ligament results in susceptibility artifact. Dental prosthesis results in susceptibility artifact. Other neck: Soft tissues of the neck are non-acute though, not tailored for evaluation. Small RIGHT greater than LEFT pleural effusions. CTA HEAD Anterior circulation: RIGHT internal carotid artery is occluded throughout the course. Due to bolus timing, at isodense appearance of the cavernous sinus and cavernous internal carotid arteries. RIGHT internal carotid artery remains occluded to the terminus. LEFT internal carotid artery is patent. Poor contrast opacification of RIGHT anterior and RIGHT middle  cerebral arteries. LEFT anterior middle cerebral arteries are patent though limited evaluation. Posterior circulation: Vertebral arteries and basilar artery patent. Poorly visualized mid to distal RIGHT posterior cerebral artery conforming to prior stroke. LEFT posterior cerebral artery appears patent. IMPRESSION: Extremely limited assessment due to technical difficulties. Non angiographic phase. In addition, streak artifact from bulky calcified stylohyoid ligaments limits evaluation of the carotid bifurcations . RIGHT internal artery occlusion to the level of the carotid terminus with minimal contrast within RIGHT anterior and middle cerebral artery. Bilateral pleural effusions. Acute findings discussed with and reconfirmed by Dr.CHARLES STEWART on 2016/05/05 at 2:45 am. Electronically Signed   By: Awilda Metro M.D.   On: May 05, 2016 02:57   Ct Head Wo Contrast  05-May-2016  CLINICAL DATA:  55 year old male code stroke presentation with right side emergent large vessel occlusion at the level of the cervical ICA status post endovascular neuro intervention. Initial encounter. EXAM: CT HEAD WITHOUT CONTRAST TECHNIQUE: Contiguous axial images were obtained from the base of the skull through the vertex without intravenous contrast. COMPARISON:  Endovascular Neuro Interventional images 0 725 hours today. Presentation head CT without contrast 0134 hours. FINDINGS: Bulky styloid and stylohyoid ligament calcification partially re- demonstrated. No acute osseous abnormality identified. Stable orbit and scalp soft tissues. Fluid in the pharynx, the patient is intubated now. There is residual intravascular contrast. There is abnormal intense contrast enhancement within the right basal ganglia and in swollen gyri throughout much of the right MCA territory. There is associated right hemisphere mass effect now with partial effacement of the right lateral ventricle and 2-3 mm of leftward midline shift. No intraventricular or  extra-axial hemorrhage identified. Chronic right PCA territory encephalomalacia affecting the occipital lobe appear stable. Gray-white matter differentiation in the left hemisphere and posterior fossa is stable. IMPRESSION: 1. Widespread new abnormal right MCA territory gyral edema with dense post intervention hyperenhancement +/- parenchymal hemorrhage. 2. Associated new intracranial mass effect with mild effacement of the right lateral ventricle and 2-3 mm of leftward midline shift. Critical Value/emergent results were called by telephone at the time of interpretation on 05/05/2016 at 8:13 am to Dr. Dr. Ritta Slot, who verbally acknowledged these results. Electronically Signed   By: Odessa Fleming M.D.   On: 2016/05/05 08:15   Ct Head Wo Contrast  May 05, 2016  CLINICAL DATA:  55 year old male with code stroke and syncopal episode. EXAM: CT HEAD WITHOUT CONTRAST CT CERVICAL SPINE WITHOUT CONTRAST TECHNIQUE: Multidetector CT imaging of the head and cervical spine was performed following the standard protocol without intravenous contrast. Multiplanar CT image reconstructions of the cervical spine were also generated. COMPARISON:  Head CT dated 11/22/2015 FINDINGS: CT HEAD FINDINGS Evaluation of this exam is limited due to motion artifact. The ventricles and sulci are appropriate in size for patient's age. Mild periventricular and deep white matter chronic microvascular ischemic changes noted. There is a large area of old infarct and encephalomalacia involving the right occipital lobe which is new compared the prior study dated  11/22/2015. There is no acute intracranial hemorrhage. There is a 4 mm segment of high attenuation involving the right MCA which is new compared to the prior study and may represent a right MCA thrombus. There is apparent asymmetric prominence of the right centrum semiovale white matter which may represent mild underlying edema. The visualized paranasal sinuses and mastoid air cells are  clear. The calvarium is intact. CT CERVICAL SPINE FINDINGS There is no acute fracture or subluxation of the cervical spine.Multilevel degenerative changes with osteophyte formation.The odontoid and spinous processes are intact.There is normal anatomic alignment of the C1-C2 lateral masses. The visualized soft tissues appear unremarkable. Partially visualized bilateral pleural effusions. IMPRESSION: No acute intracranial hemorrhage. Focal high attenuation in the M1 segment of the right MCA with possible mild edema in the right centrum semiovale. Findings concerning for right MCA thrombus and possible right MCA territory acute ischemia. Correlation with clinical exam and further evaluation with CT angiography or MRI recommended. Right occipital old infarct and encephalomalacia. No acute/traumatic cervical spine pathology. These results were called by telephone at the time of interpretation on 22-Apr-2016 at 1:53 am to Dr.Stewart, who verbally acknowledged these results. Electronically Signed   By: Elgie Collard M.D.   On: 2016-04-22 01:57   Ct Angio Neck W Or Wo Contrast  Apr 22, 2016  CLINICAL DATA:  RIGHT facial droop, slurred speech. Headache and syncopal episode. History of hypertension, diabetes, CHF. EXAM: CT ANGIOGRAPHY HEAD AND NECK TECHNIQUE: Multidetector CT imaging of the head and neck was performed using the standard protocol during bolus administration of intravenous contrast. Multiplanar CT image reconstructions and MIPs were obtained to evaluate the vascular anatomy. Carotid stenosis measurements (when applicable) are obtained utilizing NASCET criteria, using the distal internal carotid diameter as the denominator. Multidetector CT imaging of the head was performed using the standard protocol during bolus administration of intravenous contrast. Multiplanar CT image reconstructions and MIPs were obtained to evaluate the vascular anatomy. Automated contrast tree during at failed, technologist may  annually administered bolus, with delayed, non angiographic phase. CONTRAST:  50 cc Isovue 370 COMPARISON:  CT HEAD Apr 22, 2016 at 0134 hours FINDINGS: Manual contrast administration results in non angiographic phase. Extremely limited evaluation. CTA NECK Aortic arch: Normal appearance of the thoracic arch, 2 vessel arch is a normal variant. No definite stenosis arch vessel origins though limited assessment. Right carotid system: Common carotid artery is widely patent, coursing in a straight line fashion. RIGHT internal carotid artery is occluded distal to the origin though, limited evaluation. Left carotid system: Common carotid artery is widely patent, coursing in a straight line fashion. LEFT internal carotid artery is patent. Vertebral arteries:Vertebral arteries are patent. Skeleton: No acute osseous process though bone windows have not been submitted. Bulky calcified stylohyoid ligament results in susceptibility artifact. Dental prosthesis results in susceptibility artifact. Other neck: Soft tissues of the neck are non-acute though, not tailored for evaluation. Small RIGHT greater than LEFT pleural effusions. CTA HEAD Anterior circulation: RIGHT internal carotid artery is occluded throughout the course. Due to bolus timing, at isodense appearance of the cavernous sinus and cavernous internal carotid arteries. RIGHT internal carotid artery remains occluded to the terminus. LEFT internal carotid artery is patent. Poor contrast opacification of RIGHT anterior and RIGHT middle cerebral arteries. LEFT anterior middle cerebral arteries are patent though limited evaluation. Posterior circulation: Vertebral arteries and basilar artery patent. Poorly visualized mid to distal RIGHT posterior cerebral artery conforming to prior stroke. LEFT posterior cerebral artery appears patent. IMPRESSION: Extremely limited assessment  due to technical difficulties. Non angiographic phase. In addition, streak artifact from bulky  calcified stylohyoid ligaments limits evaluation of the carotid bifurcations . RIGHT internal artery occlusion to the level of the carotid terminus with minimal contrast within RIGHT anterior and middle cerebral artery. Bilateral pleural effusions. Acute findings discussed with and reconfirmed by Dr.CHARLES STEWART on May 07, 2016 at 2:45 am. Electronically Signed   By: Awilda Metro M.D.   On: May 07, 2016 02:57   Ct Cervical Spine Wo Contrast  07-May-2016  CLINICAL DATA:  55 year old male with code stroke and syncopal episode. EXAM: CT HEAD WITHOUT CONTRAST CT CERVICAL SPINE WITHOUT CONTRAST TECHNIQUE: Multidetector CT imaging of the head and cervical spine was performed following the standard protocol without intravenous contrast. Multiplanar CT image reconstructions of the cervical spine were also generated. COMPARISON:  Head CT dated 11/22/2015 FINDINGS: CT HEAD FINDINGS Evaluation of this exam is limited due to motion artifact. The ventricles and sulci are appropriate in size for patient's age. Mild periventricular and deep white matter chronic microvascular ischemic changes noted. There is a large area of old infarct and encephalomalacia involving the right occipital lobe which is new compared the prior study dated 11/22/2015. There is no acute intracranial hemorrhage. There is a 4 mm segment of high attenuation involving the right MCA which is new compared to the prior study and may represent a right MCA thrombus. There is apparent asymmetric prominence of the right centrum semiovale white matter which may represent mild underlying edema. The visualized paranasal sinuses and mastoid air cells are clear. The calvarium is intact. CT CERVICAL SPINE FINDINGS There is no acute fracture or subluxation of the cervical spine.Multilevel degenerative changes with osteophyte formation.The odontoid and spinous processes are intact.There is normal anatomic alignment of the C1-C2 lateral masses. The visualized soft  tissues appear unremarkable. Partially visualized bilateral pleural effusions. IMPRESSION: No acute intracranial hemorrhage. Focal high attenuation in the M1 segment of the right MCA with possible mild edema in the right centrum semiovale. Findings concerning for right MCA thrombus and possible right MCA territory acute ischemia. Correlation with clinical exam and further evaluation with CT angiography or MRI recommended. Right occipital old infarct and encephalomalacia. No acute/traumatic cervical spine pathology. These results were called by telephone at the time of interpretation on 05/07/16 at 1:53 am to Dr.Stewart, who verbally acknowledged these results. Electronically Signed   By: Elgie Collard M.D.   On: May 07, 2016 01:57   Mr Brain Wo Contrast  04/15/2016  CLINICAL DATA:  Initial evaluation for acute stroke. EXAM: MRI HEAD WITHOUT CONTRAST TECHNIQUE: Multiplanar, multiecho pulse sequences of the brain and surrounding structures were obtained without intravenous contrast. COMPARISON:  Prior CT from 05/07/2016 as well as earlier studies. FINDINGS: Extensive abnormal confluent restricted diffusion seen throughout almost the entirety of the right MCA territory, consistent with acute right MCA territory infarct. Associated cytotoxic edema with gyral swelling and sulcal effacement is present. There is extensive petechial hemorrhage throughout the area of infarction, involving both the cortical and underlying deep gray matter. There is involvement of the right ACA territory with involvement of the corpus callosum. Right to left midline shift as markedly worsened now measuring up to 16 mm (previously 3 mm) with right uncal and parafalcine herniation. The right lateral ventricle is largely effaced. There is asymmetric dilatation of the left lateral ventricle, particularly at the occipital and temporal horns. T2/FLAIR hyperintensity within the periventricular white matter concerning for possible transependymal  flow of CSF, suggesting ventricular trapping. Effacement of the basilar cisterns  with compression of the midbrain and upper pons. There is associated infarct involving the midbrain and predominately left pons. Extension into the medial left thalamus and mesial left temporal lobe. Additional abnormal restricted diffusion involves the parasagittal occipital lobes bilaterally, consistent with acute PCA territory infarcts. This is suspected to be related to herniation. Additionally, there is abnormal diffusion abnormality within the periatrial white matter adjacent to the dilated left occipital and temporal horns, also concerning for ischemia, which may in part be related to transependymal flow of CSF/ventricular dilatation. Few smaller patchy infarcts noted within the posterior left MCA territory as well. Tiny right cerebellar infarct noted. Proximal intracranial vascular flow voids are maintained. Distal basilar and PCAs are not visualized. Anterior circulation grossly patent. No mass lesion. Craniocervical junction normal. Visualized upper cervical spine unremarkable. Pituitary gland within normal limits. No acute abnormality about the globes and orbits. Paranasal sinuses are clear. Fluid within nasopharynx. Patient likely intubated. No significant mastoid effusion. Inner ear structures grossly normal. Bone marrow signal intensity within normal limits. No scalp soft tissue abnormality. IMPRESSION: 1. Large confluent ischemic infarct involving nearly the entirety of the right MCA territory with associated petechial hemorrhage throughout the area of infarction. 2. Markedly worsened right to left shift now measuring up to 16 mm with right uncal and parafalcine herniation. Findings suspicious for ventricular trapping involving the left lateral ventricle. 3. Additional ischemic infarcts involving the right ACA territory and bilateral PCA territories, including the midbrain and pons as well as the medial left thalamus,  likely related to herniation. 4. Small volume patchy acute left MCA territory infarcts, with additional tiny right cerebellar infarct. Critical Value/emergent results were called by telephone at the time of interpretation on 04/15/2016 at 4:07 am to Dr. Noel Christmas , who verbally acknowledged these results. Electronically Signed   By: Rise Mu M.D.   On: 04/15/2016 04:18   Dg Chest Port 1 View  04/15/2016  CLINICAL DATA:  Endotracheal tube position EXAM: PORTABLE CHEST 1 VIEW COMPARISON:  03/28/2016 FINDINGS: Endotracheal tube remains in good position. NG tube enters the stomach. Persistent right lower lobe volume loss and opacity. The right heart border is well-defined raising the possibility of the medial base pneumothorax. No pleural reflection identified. Negative for heart failure.  Left lung clear. IMPRESSION: Endotracheal tube remains in good position Persistent right lower lobe opacity most consistent with right lower lobe partial collapse. The right heart border is well outlined by air density raising the possibility of basilar pneumothorax. However, no pleural reflection is identified and this is a semi upright view. This appearance could also be seen with right lower lobe collapse. Continued follow-up is suggested. Electronically Signed   By: Marlan Palau M.D.   On: 04/15/2016 07:13   Dg Chest Port 1 View  03/30/2016  CLINICAL DATA:  Acute respiratory failure EXAM: PORTABLE CHEST 1 VIEW COMPARISON:  03/29/2016 FINDINGS: Cardiomegaly again noted. There is hazy right lower lobe and right infrahilar atelectasis or infiltrate. Probable small right pleural effusion. No convincing pulmonary edema. Endotracheal tube in place with tip 4 cm above the carina. NG tube in place. There is no pneumothorax. IMPRESSION: Hazy right lower lobe and infrahilar atelectasis or infiltrate. No pulmonary edema. Probable small right pleural effusion. Endotracheal and NG tube in place. Electronically Signed    By: Natasha Mead M.D.   On: 03/29/2016 09:54   Dg Chest Port 1 View  04/21/2016  CLINICAL DATA:  Acute onset of shortness of breath. Initial encounter. EXAM: PORTABLE CHEST 1  VIEW COMPARISON:  CTA of the neck performed earlier today at 2:08 a.m., and chest radiograph performed 02/07/2016 FINDINGS: Known small right and trace left pleural effusions are not well seen on this study. Mild right perihilar opacity could reflect pneumonia. No pneumothorax is seen. The cardiomediastinal silhouette is mildly enlarged. No acute osseous abnormalities identified. IMPRESSION: 1. Mild right perihilar opacity raises question for pneumonia. 2. Known small right and trace left pleural effusions are not well seen on this study. 3. Mild cardiomegaly. Electronically Signed   By: Roanna Raider M.D.   On: 04/12/2016 03:12    Labs:  CBC:  Recent Labs  02/07/16 0221 03/29/2016 0143 04/08/2016 0150 04/03/2016 0924 04/15/16 0435  WBC 8.9 8.4  --  10.2 13.9*  HGB 11.6* 13.1 15.3 12.6* 13.9  HCT 35.3* 40.1 45.0 38.4* 43.4  PLT 127* 164  --  149* 144*    COAGS:  Recent Labs  10/13/15 0247 11/03/15 0006 11/03/15 0401 12/09/15 0906 01/26/16 1756 02/06/16 0046 03/30/2016 0143  INR 1.31 1.40 1.36 1.37 1.82* 1.43 1.45  APTT 36 33 33  --   --   --  30    BMP:  Recent Labs  04/07/2016 0924 04/23/2016 2100 04/04/2016 2256 04/15/16 0435  NA 136 147* 151* 164*  K 3.0* 3.9 3.8 3.7  CL 109 118* 124* >130*  CO2 22 24 25 27   GLUCOSE 258* 180* 214* 133*  BUN 12 9 9 10   CALCIUM 7.8* 8.6* 8.7* 9.6  CREATININE 1.04 1.01 1.08 1.11  GFRNONAA >60 >60 >60 >60  GFRAA >60 >60 >60 >60    LIVER FUNCTION TESTS:  Recent Labs  01/05/16 1210 01/26/16 1756 01/28/16 0218 04/21/2016 0143  BILITOT 1.4* 1.7* 0.8 1.4*  AST 23 39 33 32  ALT 65* 76* 55 38  ALKPHOS 197* 242* 171* 198*  PROT 5.6* 6.3* 4.7* 5.9*  ALBUMIN 3.0* 2.8* 2.1* 2.8*    Assessment and Plan:  CVA MRA with worsening infarct and Rt to Lt shift Plan per  Neuro- Dr 03/29/16  Electronically Signed: 04/16/16 A 04/15/2016, 10:52 AM   I spent a total of 15 Minutes at the the patient's bedside AND on the patient's hospital floor or unit, greater than 50% of which was counseling/coordinating care for CVA

## 2016-04-15 NOTE — Progress Notes (Signed)
Notified Dr. Molli Knock of CBG 405; NA 167; CL >130 @1540 

## 2016-04-15 NOTE — Progress Notes (Signed)
PULMONARY / CRITICAL CARE MEDICINE   Name: Sean Hinton MRN: [id number] DOB: May 22, 1961    ADMISSION DATE:  04/19/2016 CONSULTATION DATE:  03/30/2016  REFERRING MD:  Dr. Blinda Leatherwood  CHIEF COMPLAINT:  Weakness  HISTORY OF PRESENT ILLNESS:   55 yo male smoker had syncopal episode at home witness by his wife.  He had Rt sided headache.  He was noted to have Rt sided gaze, slurred speech, and Lt side flaccid.  His blood sugar was > 600.  His significant other was not sure if he has been taking his medications.  Code stroke activated, and he was seen by neurology.  His UDS was positive for cocaine.  He had episode of vomiting blood in the ER.  He was started on insulin gtt.  Planning to go to IR for potential revascularization. PCCM consulted to assist with medical management in ICU.  SUBJECTIVE:  DI overnight, completely unresponsive.  VITAL SIGNS: BP 111/81 mmHg  Pulse 108  Temp(Src) 98 F (36.7 C) (Oral)  Resp 14  Ht 5\' 9"  (1.753 m)  Wt 74.4 kg (164 lb 0.4 oz)  BMI 24.21 kg/m2  SpO2 100%  HEMODYNAMICS:    VENTILATOR SETTINGS: Vent Mode:  [-] PRVC FiO2 (%):  [40 %-50 %] 40 % Set Rate:  [14 bmp] 14 bmp Vt Set:  [610 mL] 610 mL PEEP:  [5 cmH20] 5 cmH20 Plateau Pressure:  [18 cmH20-22 cmH20] 22 cmH20  INTAKE / OUTPUT: I/O last 3 completed shifts: In: 5088.5 [I.V.:4081.9; NG/GT:506.7; IV Piggyback:500] Out: 12-24-1983 [Urine:11850; Emesis/NG output:20; Blood:50]  PHYSICAL EXAMINATION: General:  Male of normal body habitus in NAD Neuro:  Alert, oriented, unable to move L side.  HEENT:  [place]/AT, no appreciable JVD Cardiovascular:  Tachy, regular, no MRG Lungs:  Clear bilateral breath sounds Abdomen:  Soft, non-tender, non-distended Musculoskeletal:  No acute deformity Skin:  Grossly intact  LABS:  BMET  Recent Labs Lab 04/10/2016 2100 04/17/2016 2256 04/15/16 0435  NA 147* 151* 164*  K 3.9 3.8 3.7  CL 118* 124* >130*  CO2 24 25 27   BUN 9 9 10   CREATININE 1.01 1.08 1.11   GLUCOSE 180* 214* 133*   Electrolytes  Recent Labs Lab 04/04/2016 2100 04/21/2016 2256 04/15/16 0435  CALCIUM 8.6* 8.7* 9.6  MG  --   --  2.3  PHOS  --   --  <1.0*   CBC  Recent Labs Lab 04/17/2016 0143 04/13/2016 0150 04/13/2016 0924 04/15/16 0435  WBC 8.4  --  10.2 13.9*  HGB 13.1 15.3 12.6* 13.9  HCT 40.1 45.0 38.4* 43.4  PLT 164  --  149* 144*   Coag's  Recent Labs Lab 04/04/2016 0143  APTT 30  INR 1.45    Sepsis Markers No results for input(s): LATICACIDVEN, PROCALCITON, O2SATVEN in the last 168 hours.  ABG  Recent Labs Lab 04/20/2016 1000 04/15/16 0350  PHART 7.312* 7.400  PCO2ART 45.3* 44.9  PO2ART 233* 91.5   Liver Enzymes  Recent Labs Lab 04/08/2016 0143  AST 32  ALT 38  ALKPHOS 198*  BILITOT 1.4*  ALBUMIN 2.8*   Cardiac Enzymes No results for input(s): TROPONINI, PROBNP in the last 168 hours.  Glucose  Recent Labs Lab 04/07/2016 1516 03/30/2016 1626 04/01/2016 1721 04/21/2016 1824 04/03/2016 1956 04/15/16 0345  GLUCAP 106* 112* 140* 209* 172* 102*    Imaging Mr Brain Wo Contrast  04/15/2016  CLINICAL DATA:  Initial evaluation for acute stroke. EXAM: MRI HEAD WITHOUT CONTRAST TECHNIQUE: Multiplanar, multiecho pulse sequences of the  brain and surrounding structures were obtained without intravenous contrast. COMPARISON:  Prior CT from 03/28/2016 as well as earlier studies. FINDINGS: Extensive abnormal confluent restricted diffusion seen throughout almost the entirety of the right MCA territory, consistent with acute right MCA territory infarct. Associated cytotoxic edema with gyral swelling and sulcal effacement is present. There is extensive petechial hemorrhage throughout the area of infarction, involving both the cortical and underlying deep gray matter. There is involvement of the right ACA territory with involvement of the corpus callosum. Right to left midline shift as markedly worsened now measuring up to 16 mm (previously 3 mm) with right uncal and  parafalcine herniation. The right lateral ventricle is largely effaced. There is asymmetric dilatation of the left lateral ventricle, particularly at the occipital and temporal horns. T2/FLAIR hyperintensity within the periventricular white matter concerning for possible transependymal flow of CSF, suggesting ventricular trapping. Effacement of the basilar cisterns with compression of the midbrain and upper pons. There is associated infarct involving the midbrain and predominately left pons. Extension into the medial left thalamus and mesial left temporal lobe. Additional abnormal restricted diffusion involves the parasagittal occipital lobes bilaterally, consistent with acute PCA territory infarcts. This is suspected to be related to herniation. Additionally, there is abnormal diffusion abnormality within the periatrial white matter adjacent to the dilated left occipital and temporal horns, also concerning for ischemia, which may in part be related to transependymal flow of CSF/ventricular dilatation. Few smaller patchy infarcts noted within the posterior left MCA territory as well. Tiny right cerebellar infarct noted. Proximal intracranial vascular flow voids are maintained. Distal basilar and PCAs are not visualized. Anterior circulation grossly patent. No mass lesion. Craniocervical junction normal. Visualized upper cervical spine unremarkable. Pituitary gland within normal limits. No acute abnormality about the globes and orbits. Paranasal sinuses are clear. Fluid within nasopharynx. Patient likely intubated. No significant mastoid effusion. Inner ear structures grossly normal. Bone marrow signal intensity within normal limits. No scalp soft tissue abnormality. IMPRESSION: 1. Large confluent ischemic infarct involving nearly the entirety of the right MCA territory with associated petechial hemorrhage throughout the area of infarction. 2. Markedly worsened right to left shift now measuring up to 16 mm with right  uncal and parafalcine herniation. Findings suspicious for ventricular trapping involving the left lateral ventricle. 3. Additional ischemic infarcts involving the right ACA territory and bilateral PCA territories, including the midbrain and pons as well as the medial left thalamus, likely related to herniation. 4. Small volume patchy acute left MCA territory infarcts, with additional tiny right cerebellar infarct. Critical Value/emergent results were called by telephone at the time of interpretation on 04/15/2016 at 4:07 am to Dr. Noel Christmas , who verbally acknowledged these results. Electronically Signed   By: Rise Mu M.D.   On: 04/15/2016 04:18   Dg Chest Port 1 View  04/15/2016  CLINICAL DATA:  Endotracheal tube position EXAM: PORTABLE CHEST 1 VIEW COMPARISON:  04/17/2016 FINDINGS: Endotracheal tube remains in good position. NG tube enters the stomach. Persistent right lower lobe volume loss and opacity. The right heart border is well-defined raising the possibility of the medial base pneumothorax. No pleural reflection identified. Negative for heart failure.  Left lung clear. IMPRESSION: Endotracheal tube remains in good position Persistent right lower lobe opacity most consistent with right lower lobe partial collapse. The right heart border is well outlined by air density raising the possibility of basilar pneumothorax. However, no pleural reflection is identified and this is a semi upright view. This appearance could also be  seen with right lower lobe collapse. Continued follow-up is suggested. Electronically Signed   By: Marlan Palau M.D.   On: 04/15/2016 07:13   STUDIES:  6/21 CT head >> high attenuation M1 of Rt MCA, old Rt occipital infarct 6/21 CT angio head/neck >> Rt internal carotid artery occlusion to level of carotid terminus  CULTURES: 6/21 Urine >>   ANTIBIOTICS: None  SIGNIFICANT EVENTS: 6/21 Admit  LINES/TUBES: ETT 6/21>>>  DISCUSSION: 55 yo male smoker  with slurred speech, Rt gaze, Lt sided weakness from Rt MCA CVA.  UDS positive for cocaine, hyperglycemia with CBG > 600, hematemesis.  PCCM asked to assist with medical management in ICU.  He has hx of CVA, CAD, ischemic CM (EF 40%), A fib on xarelto, mod MR, HTN, DM, GERD, OSA.  ASSESSMENT / PLAN:  NEUROLOGIC A:   Acute Rt MCA CVA with hx of prior CVA. L hemiplegia Cocaine abuse. DI  P:   Per neurology, and neuro-IR Cervical collar in place, no true fall, low threshold to DC if able to clear. Needs full comfort care at this point, little to offer here.  PULMONARY A: Hx of OSA, tobacco abuse.  P:   Full vent support. Monitor oxygenation. Prn BDs. Bronchial hygiene. Titrate O2 for sat of 88-92%.  CARDIOVASCULAR A:  HTN urgency Hx of CAD, chronic systolic CHF, HTN, A fib, mod MR, HLD.  P:  Resume amiodarone, lipitor, digoxin, diltiazem, lasix, imdur, lisinopril when able to swallow. Nicardipine to keep SBP < 170, DBP < 105. PRN hydralazine. No beta-blocker in setting cocaine. Hold outpt xarelto and plavix with concern for GI bleed. SBP 120-140 after neuro IR intervention.  RENAL A:   CKD 2. Likely DI overnight  P:   Monitor renal fx, urine outpt BMET in AM Replace electrolytes as indicated. Vasopressin. Match I/O with 1/2 NS.  GASTROINTESTINAL A:   Hematemesis episode in ER 6/21. Hx of GERD. Dysphagia.  P:   TF as per nutrition. Pepcid Monitor for further hematemesis, xarelto and plavix on hold. BP improved.   HEMATOLOGIC A:   Chronic anticoagulation in setting AF (on xarelto)  P:  F/u CBC. SCDs. Anticoagulation per neurology.  INFECTIOUS A:   No evidence for infection.  P:   Monitor clinically Hold abx.  ENDOCRINE A:   Hyperglycemia with hx of DM2.    P:   Beta-hydroxybutyric acid 0.10 (nl). KVO IVF. Insulin gtt.  FAMILY  - Updates: Large family bedside being updated by neurology.  - Inter-disciplinary family meet or  Palliative Care meeting due by:  6/28  The patient is critically ill with multiple organ systems failure and requires high complexity decision making for assessment and support, frequent evaluation and titration of therapies, application of advanced monitoring technologies and extensive interpretation of multiple databases.   Critical Care Time devoted to patient care services described in this note is  35  Minutes. This time reflects time of care of this signee Dr Koren Bound. This critical care time does not reflect procedure time, or teaching time or supervisory time of PA/NP/Med student/Med Resident etc but could involve care discussion time.  Alyson Reedy, M.D. [facility] Pulmonary/Critical Care Medicine. Pager: [phone]. After hours pager: [phone].   04/15/2016 10:22 AM

## 2016-04-15 NOTE — Progress Notes (Signed)
PT Cancellation Note  Patient Details Name: Sean Hinton MRN: [id number] DOB: 07-Jul-1961   Cancelled Treatment:    Reason Eval/Treat Not Completed: Patient not medically ready, bedrest   Fabio Asa 04/15/2016, 7:47 AM

## 2016-04-15 NOTE — Progress Notes (Signed)
eLink Physician-Brief Progress Note Patient Name: Sean Hinton DOB: [birth year] MRN: [id number]   Date of Service  04/15/2016  HPI/Events of Note  MRI head results discussed with brother via telephone.    eICU Interventions  Brother Cletis [place]) will visit patient around 8am today to discuss clinical condition and code status.  Currently FULL CODE.      Intervention Category Major Interventions: Intracranial hypertension - evaluation and management  Darel Ricketts 04/15/2016, 6:43 AM

## 2016-04-15 NOTE — Progress Notes (Signed)
eLink Physician-Brief Progress Note Patient Name: Sean Hinton DOB: December 23, 1960 MRN: [id number]   Date of Service  04/15/2016  HPI/Events of Note  Inc UOP (about 772cc/hr since 7p), sOSM=320, uOSM=62, Na [phone]  eICU Interventions  Most likely central DI Inc NS to 100cc/hr Desmopressin intra nasal BID     Intervention Category Major Interventions: Other:  Takeesha Isley 04/15/2016, 12:38 AM

## 2016-04-15 NOTE — Progress Notes (Signed)
OT Cancellation Note  Patient Details Name: Sean Hinton MRN: [id number] DOB: 11-May-1961   Cancelled Treatment:    Reason Eval/Treat Not Completed: Patient not medically ready Pt on bedrest.  [facility] Tillmon Kisling, OTR/L  [phone] 04/15/2016 04/15/2016, 7:48 AM

## 2016-04-15 NOTE — Progress Notes (Signed)
SLP Cancellation Note  Patient Details Name: Jeorge Reister MRN: [id number] DOB: 1961/09/03   Cancelled treatment:       Reason Eval/Treat Not Completed: Medical issues which prohibited therapy. Will sign off at this time   Claudine Mouton 04/15/2016, 7:53 AM

## 2016-04-15 NOTE — Progress Notes (Signed)
CRITICAL VALUE ALERT  Critical value received:  Chloride >130     Phosphorus < 1.0      Sodium 164  Date of notification:  04/15/16  Time of notification:  0512  Critical value read back:Yes.    Nurse who received alert:  Telford Nab RN  MD notified (1st page):  Dr. Dema Severin  Time of first page:  0512  MD notified (2nd page):  Time of second page:  Responding MD:  Dr. Dema Severin  Time MD responded:  [phone]

## 2016-04-15 NOTE — Progress Notes (Signed)
   04/15/16 1100  Clinical Encounter Type  Visited With Patient;Patient and family together;Health care provider  Visit Type Follow-up;Spiritual support;Social support  Referral From Family;Nurse  Consult/Referral To Chaplain  Spiritual Encounters  Spiritual Needs Prayer;Emotional;Grief support  Stress Factors  Patient Stress Factors Exhausted;Family relationships;Health changes;Loss of control;Major life changes  Family Stress Factors Exhausted;Family relationships;Health changes;Lack of knowledge;Loss of control;Major life changes   Chaplain attended family meeting with physician and healthcare staff.  Physician told family that pt had sustained heavy brain damage due to stroke. Family decided to wait to make any major decisions.  Chaplain offered ministry of presence to family and offered prayer to family and pt.  Rosezella [place] Gregory Barrick 04/15/2016 11:13 AM

## 2016-04-15 NOTE — Progress Notes (Signed)
eLink Physician-Brief Progress Note Patient Name: Sean Hinton DOB: 10-20-1961 MRN: [id number]   Date of Service  04/15/2016  HPI/Events of Note  Inc serum Na, inc UOP, low phos  eICU Interventions  Phos replaced Changed IVFs to D5W, Na=164 MRI Head with 59mm R to L shift and right uncal and parafalcine - d/w Dr. Roseanne Reno, no further interventions at this time, medical management.     Intervention Category Major Interventions: Intracranial hypertension - evaluation and management  Tavonte Seybold 04/15/2016, 5:24 AM

## 2016-04-15 NOTE — Progress Notes (Addendum)
STROKE TEAM PROGRESS NOTE   SUBJECTIVE (INTERVAL HISTORY) Patient with neuro worsening over night. Imaging with large confluent R brain MCA, ACA and bilateral PCA infarcts along with punctate left MCA infarcts, hemorrhagic transformation and significant midline shift. Now in DI with increased UOP and high Na. No pupillary or corenals. Has cough. Entire family present to speak with Dr. Roda Shutters related to dx, treatment and prognosis.   Dr. Roda Shutters had extended conversation with family. They do not want to escalate care. They do not want surgical intervention. They are agreeable to DNR. Some family members are ready with withdraw care, others are not. Fiance, who is POA, wants to observe pt progress over the next few days before making her decision.   OBJECTIVE Temp:  [95.4 F (35.2 C)-99.9 F (37.7 C)] 98 F (36.7 C) (06/22 0842) Pulse Rate:  [87-108] 108 (06/22 0755) Cardiac Rhythm:  [-] Sinus tachycardia (06/22 0600) Resp:  [12-21] 14 (06/22 0800) BP: (104-131)/(68-104) 111/81 mmHg (06/22 0800) SpO2:  [99 %-100 %] 100 % (06/22 0755) FiO2 (%):  [40 %-50 %] 40 % (06/22 0755) Weight:  [74.4 kg (164 lb 0.4 oz)] 74.4 kg (164 lb 0.4 oz) (06/22 0405)  CBC:   Recent Labs Lab 04/21/2016 0924 04/15/16 0435  WBC 10.2 13.9*  NEUTROABS 7.5 10.3*  HGB 12.6* 13.9  HCT 38.4* 43.4  MCV 82.6 84.6  PLT 149* 144*    Basic Metabolic Panel:   Recent Labs Lab 04/22/2016 2256 04/15/16 0435  NA 151* 164*  K 3.8 3.7  CL 124* >130*  CO2 25 27  GLUCOSE 214* 133*  BUN 9 10  CREATININE 1.08 1.11  CALCIUM 8.7* 9.6  MG  --  2.3  PHOS  --  <1.0*    Lipid Panel:     Component Value Date/Time   CHOL 176 04/15/2016 0634   TRIG 68 04/15/2016 0634   HDL 47 04/15/2016 0634   CHOLHDL 3.7 04/15/2016 0634   VLDL 14 04/15/2016 0634   LDLCALC 115* 04/15/2016 0634   HgbA1c:  Lab Results  Component Value Date   HGBA1C 13.4* 01/26/2016   Urine Drug Screen:     Component Value Date/Time   LABOPIA NONE  DETECTED 04/16/2016 0249   COCAINSCRNUR POSITIVE* 04/07/2016 0249   LABBENZ NONE DETECTED 04/22/2016 0249   AMPHETMU NONE DETECTED 04/05/2016 0249   THCU NONE DETECTED 03/28/2016 0249   LABBARB NONE DETECTED 04/12/2016 0249      IMAGING  Ct Head Wo Contrast 04/01/2016  1. Widespread new abnormal right MCA territory gyral edema with dense post intervention hyperenhancement +/- parenchymal hemorrhage. 2. Associated new intracranial mass effect with mild effacement of the right lateral ventricle and 2-3 mm of leftward midline shift.  04/03/2016   No acute intracranial hemorrhage. Focal high attenuation in the M1 segment of the right MCA with possible mild edema in the right centrum semiovale. Findings concerning for right MCA thrombus and possible right MCA territory acute ischemia. Right occipital old infarct and encephalomalacia.   Ct Angio Head & Neck W Or Wo Contrast 04/10/2016  Extremely limited assessment due to technical difficulties. Non angiographic phase. In addition, streak artifact from bulky calcified stylohyoid ligaments limits evaluation of the carotid bifurcations . RIGHT internal artery occlusion to the level of the carotid terminus with minimal contrast within RIGHT anterior and middle cerebral artery. Bilateral pleural effusions.   Ct Cervical Spine Wo Contrast 04/02/2016    No acute/traumatic cervical spine pathology.   Cerebral angiogram 04/06/2016 Bilateral common carotid  arteriograms and RT vert artery angiogram followed by endovascular complete revascularizationof occluded RT ICA prox and distally at the terminus ,the RT MCA and RT ACA with x 1pass with Solitaire FR 4 mmx 40 mm Retrieval device and 50m,g of IA integrelin intracranially..Complete revascularization of RT ICA RT MCA TICI 2b revascularization Also noted scattered smooth areas of narrowing of MCA and ACA branches ?vasospasm chemical related  MRI brain 04/15/2016 1. Large confluent ischemic infarct involving  nearly the entirety of the right MCA territory with associated petechial hemorrhage throughout the area of infarction. 2. Markedly worsened right to left shift now measuring up to 16 mm with right uncal and parafalcine herniation. Findings suspicious for ventricular trapping involving the left lateral ventricle. 3. Additional ischemic infarcts involving the right ACA territory and bilateral PCA territories, including the midbrain and pons as well as the medial left thalamus, likely related to herniation.  4. Small volume patchy acute left MCA territory infarcts, with additional tiny right cerebellar infarct.  Dg Chest Port 1 View 04/15/2016 Endotracheal tube remains in good position. Persistent right lower lobe opacity most consistent with right lower lobe partial collapse. The right heart border is well outlined by air density raising the possibility of basilar pneumothorax. However, no pleural reflection is identified and this is a semi upright view. This appearance could also be seen with right lower lobe collapse. Continued follow-up is suggested. May 04, 2016   1. Mild right perihilar opacity raises question for pneumonia. 2. Known small right and trace left pleural effusions are not well seen on this study. 3. Mild cardiomegaly.   TTE  - Left ventricle: Inferior wall hypokineiss apical and septal akinesis The cavity size was moderately dilated. Wall thickness was normal. Systolic function was moderately to severely reduced. The estimated ejection fraction was in the range of 30% to 35%. Left ventricular diastolic function parameters were normal. - Mitral valve: Eccentric ischemic MR. There was severe regurgitation. - Left atrium: The atrium was moderately to severely dilated. - Right ventricle: The cavity size was mildly dilated. - Right atrium: The atrium was mildly dilated. - Atrial septum: No defect or patent foramen ovale was identified. - Pulmonary arteries: PA peak pressure: 45 mm Hg  (S).   PHYSICAL EXAM General - Well nourished, well developed, intubated and on sedation.  Ophthalmologic - Fundi not visualized due to ET positioning.  Cardiovascular - Regular rate and rhythm.  Neuro - intubated and on sedation, not open eyes or following any commands. Eyes middle position, no doll's eyes, pupils 3 mm, not responsive to light, no corneal, but positive gag or cough and intermittently breathing over the vent. On pain stimulation, no withdraw in all 4 extremities. No babinski and DTR diminished. Sensation, coordination or gait not tested.   ASSESSMENT/PLAN Mr. Sean Hinton is a 55 y.o. male with history of diabetes mellitus, hypertension, hyperlipidemia, atrial fibrillation on Xaralto, coronary artery disease, previous stroke and tobacco and cocaine abuse presenting with left sided weakness, numbness and neglect.  He did not receive IV t-PA due to being on xarelto. He was taken to IR where he had complete revascularization of occluded R ICA terminus and, TICI2b revascularization of R MCA.    Stroke:  Non-dominant right ICA infarct (entire R MCA, ACA), bilateral PCAs and punctuate L MCA infarcts, embolic due to hx atrial fibrillation not compliance with xarelto vs cardiomyopathy d/t cocaine use vs. Right ICA stenosis/occlusion. S/p revascularization of occluded R ICA terminus, and TICI2b revascularization of R MCA.  Developed hemorrhagic transformation  and mass effect (cerebral edema) with 1mm R shift with uncal and parafalcine herniation, likely contributing to additional infarcts.  CTA H&N right ICA occlusion with minimal contrast right ACA and MCA.   Post IR CT with widespread right MCA gyral edema with likely dense contrast staining (vs hemorrage). New mass effect with 3 mm left shift.   MRI  right ICA infarct (entire R MCA, ACA and some PCA) and L MCA and PCA infarcts. Petechial hemorrhagic transformation of R. 71mm R shift with uncal and parafalcine herniation,  ventricular traping.   2D Echo  EF 30-35%  LDL 115  HgbA1c pending, 13.4 in April   UDS cocaine positive  SCDs for VTE prophylaxis Diet NPO time specified  clopidogrel 75 mg daily and Xarelto (rivaroxaban) daily prior to admission, now on No antithrombotic due to hemorrhagic transformation on MRI.   DNR  Fiance considering comfort care based on progress over the next few days.   Respiratory failure  Intubated   Off propofol  CCM managing  Intermittent breath trigger vent  Diabetes insipidus  Increased UOP over night  Na 164, sOSM 320, uOSM 62  On desmopressin  CCM managing  D5 infusion and fluid resuscitation.   Atrial Fibrillation  Home anticoagulation:  Xarelto (rivaroxaban) daily   Likely noncompliance  Currently off AC due to hemorrhagic transformation  Cardiomyopathy   EF 30-35%  Follows with cardiology for CHF  Poor prognosis due to continued cocaine use as per cardiology notes  On Xarelto at home but likely noncompliance  CAD s/p CABG and stent  MI, CABG 02/2015, stent 10/2015  Follows with cardiology  On plavix  plavix on hold due to hemorrhagic transformation  Diabetes, poorly controlled  Glucose > 600 in ED  Off insulin gtt now  HgbA1c pending, goal < 7.0  On lantus  SSI  CCM on board   Hypertensive emergency hypotension  BP 149/124 on arrival in setting of neurologic symptoms  Started on cardene for BP control, now off  SBP goal 120-140 post IR   BP low and put on neo  Hyperlipidemia  Home meds:  lipitor 80  LDL pending, goal < 70  Resumed li[pitor 80  Continue statin at discharge  Other Stroke Risk Factors  Cigarette smoker  Chronic Cocaine abuse, UDS positive this admission  Family hx stroke (brother)  PVD  obstructive sleep apnea    Other Active Problems  Chronic kidney disease stage II 1.04  hematemesis in the ER on pepcid  hypokalemia  Hypernatremia 164  hyperchloridemia    Hypophosphatemia  Hospital day # 1  [facility] See Amion for Pager information 04/15/2016 10:54 AM   This patient is critically ill due to right ICA infarct s/p IR, afib on Xarelto, CAD/MI on plavix s/p CABG and stent, hyperglycemia on insulin drip and at significant risk of neurological worsening, death form hemorrhagic transformation, recurrent stroke, heart failure, DKA, brain herniation. This patient's care requires constant monitoring of vital signs, hemodynamics, respiratory and cardiac monitoring, review of multiple databases, neurological assessment, discussion with family, other specialists and medical decision making of high complexity. I spent 60 minutes of neurocritical care time in the care of this patient.  Pt neuro worsening. Less responsive today. Developed DI. MRI showed extensive infarcts bilaterally and brain herniation. Very poor prognosis. Had long discussion and family meeting this am, they understood the extent of neuro damage and poor prognosis. However, POA not decided on comfort care yet. But agree on DNR.  POA like to continue current treatment without escalation for a few days before making decision.   Marvel Plan, MD PhD Stroke Neurology 04/15/2016 5:25 PM     To contact Stroke Continuity provider, please refer to [email or web address]. After hours, contact General Neurology

## 2016-04-16 ENCOUNTER — Inpatient Hospital Stay (HOSPITAL_COMMUNITY): Payer: Medicaid Other

## 2016-04-16 DIAGNOSIS — I426 Alcoholic cardiomyopathy: Secondary | ICD-10-CM

## 2016-04-16 DIAGNOSIS — I63131 Cerebral infarction due to embolism of right carotid artery: Secondary | ICD-10-CM

## 2016-04-16 DIAGNOSIS — G931 Anoxic brain damage, not elsewhere classified: Secondary | ICD-10-CM

## 2016-04-16 DIAGNOSIS — I638 Other cerebral infarction: Secondary | ICD-10-CM

## 2016-04-16 LAB — BASIC METABOLIC PANEL
ANION GAP: 3 — AB (ref 5–15)
BUN: 10 mg/dL (ref 6–20)
CALCIUM: 8.5 mg/dL — AB (ref 8.9–10.3)
CO2: 25 mmol/L (ref 22–32)
CREATININE: 1.53 mg/dL — AB (ref 0.61–1.24)
Chloride: 128 mmol/L — ABNORMAL HIGH (ref 101–111)
GFR calc Af Amer: 59 mL/min — ABNORMAL LOW (ref >60)
GFR, EST NON AFRICAN AMERICAN: 51 mL/min — AB (ref >60)
GLUCOSE: 117 mg/dL — AB (ref 65–99)
Potassium: 2.9 mmol/L — ABNORMAL LOW (ref 3.5–5.1)
Sodium: 156 mmol/L — ABNORMAL HIGH (ref 135–145)

## 2016-04-16 LAB — BLOOD GAS, ARTERIAL
ACID-BASE EXCESS: 0.5 mmol/L (ref 0.0–2.0)
Bicarbonate: 25.4 mEq/L — ABNORMAL HIGH (ref 20.0–24.0)
DRAWN BY: 437071
FIO2: 0.4
LHR: 14 {breaths}/min
O2 SAT: 99.9 %
PATIENT TEMPERATURE: 98.6
PCO2 ART: 46.5 mmHg — AB (ref 35.0–45.0)
PEEP/CPAP: 5 cmH2O
PH ART: 7.356 (ref 7.350–7.450)
TCO2: 26.8 mmol/L (ref 0–100)
VT: 610 mL
pO2, Arterial: 283 mmHg — ABNORMAL HIGH (ref 80.0–100.0)

## 2016-04-16 LAB — GLUCOSE, CAPILLARY
GLUCOSE-CAPILLARY: 107 mg/dL — AB (ref 65–99)
GLUCOSE-CAPILLARY: 84 mg/dL (ref 65–99)
GLUCOSE-CAPILLARY: 123 mg/dL — AB (ref 65–99)
Glucose-Capillary: 181 mg/dL — ABNORMAL HIGH (ref 65–99)
Glucose-Capillary: 222 mg/dL — ABNORMAL HIGH (ref 65–99)

## 2016-04-16 LAB — CBC
HCT: 42.8 % (ref 39.0–52.0)
Hemoglobin: 13.3 g/dL (ref 13.0–17.0)
MCH: 27.2 pg (ref 26.0–34.0)
MCHC: 31.1 g/dL (ref 30.0–36.0)
MCV: 87.5 fL (ref 78.0–100.0)
PLATELETS: 94 10*3/uL — AB (ref 150–400)
RBC: 4.89 MIL/uL (ref 4.22–5.81)
RDW: 16.6 % — AB (ref 11.5–15.5)
WBC: 15.2 10*3/uL — AB (ref 4.0–10.5)

## 2016-04-16 LAB — HEMOGLOBIN A1C

## 2016-04-16 LAB — PHOSPHORUS: Phosphorus: 4.4 mg/dL (ref 2.5–4.6)

## 2016-04-16 LAB — MAGNESIUM: Magnesium: 1.8 mg/dL (ref 1.7–2.4)

## 2016-04-16 MED ORDER — TECHNETIUM TC 99M EXAMETAZIME IV KIT
21.7000 | PACK | Freq: Once | INTRAVENOUS | Status: AC | PRN
  Administered 2016-04-16: 22 via INTRAVENOUS

## 2016-04-16 MED ORDER — POTASSIUM CHLORIDE 10 MEQ/100ML IV SOLN
10.0000 meq | INTRAVENOUS | Status: AC
  Administered 2016-04-16 (×6): 10 meq via INTRAVENOUS
  Filled 2016-04-16 (×6): qty 100

## 2016-04-16 NOTE — Progress Notes (Signed)
OT Cancellation Note  Patient Details Name: Sean Hinton MRN: [id number] DOB: 17-Jun-1961   Cancelled Treatment:    Reason Eval/Treat Not Completed: Other (comment) Pt not medically appropriate. Will sign off at this time. Please reorder if needed. Thanks. [facility] Maxi Rodas, OTR/L  [phone] 04/16/2016 04/16/2016, 7:44 AM

## 2016-04-16 NOTE — Progress Notes (Signed)
PULMONARY / CRITICAL CARE MEDICINE   Name: Sean Hinton MRN: [id number] DOB: [birth year]    ADMISSION DATE:  April 22, 2016 CONSULTATION DATE:  Apr 22, 2016  REFERRING MD:  Dr. Blinda Leatherwood  CHIEF COMPLAINT:  Weakness  HISTORY OF PRESENT ILLNESS:   55 yo male smoker had syncopal episode at home witness by his wife.  He had Rt sided headache.  He was noted to have Rt sided gaze, slurred speech, and Lt side flaccid.  His blood sugar was > 600.  His significant other was not sure if he has been taking his medications.  Code stroke activated, and he was seen by neurology.  His UDS was positive for cocaine.  He had episode of vomiting blood in the ER.  He was started on insulin gtt.  Planning to go to IR for potential revascularization. PCCM consulted to assist with medical management in ICU.  SUBJECTIVE:  Completely unresponsive, does not withdraw and no respiratory drive on exam.  VITAL SIGNS: BP 83/60 mmHg  Pulse 92  Temp(Src) 97.7 F (36.5 C) (Core (Comment))  Resp 14  Ht 5\' 9"  (1.753 m)  Wt 77.8 kg (171 lb 8.3 oz)  BMI 25.32 kg/m2  SpO2 98%  HEMODYNAMICS:    VENTILATOR SETTINGS: Vent Mode:  [-] PRVC FiO2 (%):  [40 %] 40 % Set Rate:  [14 bmp] 14 bmp Vt Set:  [610 mL] 610 mL PEEP:  [5 cmH20] 5 cmH20 Plateau Pressure:  [18 cmH20-21 cmH20] 19 cmH20  INTAKE / OUTPUT: I/O last 3 completed shifts: In: 12455.3 [I.V.:10798.6; NG/GT:1306.7; IV Piggyback:350] Out: [Urine:12775]  PHYSICAL EXAMINATION: General:  Male of normal body habitus in NAD Neuro:  Unresponsive, negative neurologic exam and no respiratory drive. HEENT:  [place]/AT, no appreciable JVD Cardiovascular:  Tachy, regular, no MRG Lungs:  Clear bilateral breath sounds Abdomen:  Soft, non-tender, non-distended Musculoskeletal:  No acute deformity Skin:  Grossly intact  LABS:  BMET  Recent Labs Lab 04/15/16 1215 04/15/16 1450 04/16/16 0420  NA 175* 167* 156*  K 3.8 3.5 2.9*  CL >130* >130* 128*  CO2 31 28 25    BUN 9 8 10   CREATININE 1.32* 1.39* 1.53*  GLUCOSE 187* 406* 117*   Electrolytes  Recent Labs Lab 04/15/16 0435 04/15/16 1215 04/15/16 1450 04/16/16 0420  CALCIUM 9.6 10.0 9.3 8.5*  MG 2.3  --   --  1.8  PHOS <1.0*  --   --  4.4   CBC  Recent Labs Lab 04-22-2016 0924 04/15/16 0435 04/16/16 0420  WBC 10.2 13.9* 15.2*  HGB 12.6* 13.9 13.3  HCT 38.4* 43.4 42.8  PLT 149* 144* 94*   Coag's  Recent Labs Lab 04-22-16 0143  APTT 30  INR 1.45   Sepsis Markers No results for input(s): LATICACIDVEN, PROCALCITON, O2SATVEN in the last 168 hours.  ABG  Recent Labs Lab 2016-04-22 1000 04/15/16 0350 04/16/16 0320  PHART 7.312* 7.400 7.356  PCO2ART 45.3* 44.9 46.5*  PO2ART 233* 91.5 283*   Liver Enzymes  Recent Labs Lab 2016-04-22 0143  AST 32  ALT 38  ALKPHOS 198*  BILITOT 1.4*  ALBUMIN 2.8*   Cardiac Enzymes No results for input(s): TROPONINI, PROBNP in the last 168 hours.  Glucose  Recent Labs Lab 04/15/16 1132 04/15/16 1543 04/15/16 1948 04/15/16 2358 04/16/16 0326 04/16/16 0825  GLUCAP 137* 405* 322* 181* 107* 84   Imaging Dg Chest Port 1 View  04/16/2016  CLINICAL DATA:  Intubated patient, acute CVA with respiratory failure, history of coronary artery disease, cardiomyopathy, mitral  regurgitation, pulmonary hypertension, current smoker. EXAM: PORTABLE CHEST 1 VIEW COMPARISON:  Portable chest x-ray of April 15, 2016 FINDINGS: The lungs are well-expanded. No definite pneumothorax is observed today. There is no significant pleural effusion. There remains increased density in the right infrahilar region without obscuration of the heart border suggesting partial right lower lobe atelectasis. The heart is normal in size. The pulmonary vascularity is not engorged. The endotracheal tube tip lies 4.9 cm above the carina. The esophagogastric tube tip projects below the inferior margin of the image. IMPRESSION: Some improvement in the appearance of the right lung  base suggesting resolving atelectasis. No pneumothorax or significant pleural effusion is observed. No CHF. The support tubes are in reasonable position. Electronically Signed   By: David  [place] M.D.   On: 04/16/2016 07:44   STUDIES:  6/21 CT head >> high attenuation M1 of Rt MCA, old Rt occipital infarct 6/21 CT angio head/neck >> Rt internal carotid artery occlusion to level of carotid terminus  CULTURES: 6/21 Urine >>   ANTIBIOTICS: None  SIGNIFICANT EVENTS: 6/21 Admit  LINES/TUBES: ETT 6/21>>>  DISCUSSION: 55 yo male smoker with slurred speech, Rt gaze, Lt sided weakness from Rt MCA CVA.  UDS positive for cocaine, hyperglycemia with CBG > 600, hematemesis.  PCCM asked to assist with medical management in ICU.  He has hx of CVA, CAD, ischemic CM (EF 40%), A fib on xarelto, mod MR, HTN, DM, GERD, OSA.  ASSESSMENT / PLAN:  NEUROLOGIC A:   Acute Rt MCA CVA with hx of prior CVA. L hemiplegia Cocaine abuse. DI  P:   Per neurology, and neuro-IR. Perfusion study this afternoon, unable to perform apnea test. Needs full comfort care at this point, little to offer here.  PULMONARY A: Hx of OSA, tobacco abuse.  P:   Full vent support. Monitor oxygenation. Prn BDs. Bronchial hygiene. Titrate O2 for sat of 88-92%.  CARDIOVASCULAR A:  HTN urgency Hx of CAD, chronic systolic CHF, HTN, A fib, mod MR, HLD. Neurogenic shock.  P:  Neo at 200 mcg, likely brain dead and in shock at this point. PRN hydralazine. No beta-blocker in setting cocaine. Hold outpt xarelto and plavix with concern for GI bleed. SBP target of 90 mmHg.  RENAL A:   CKD 2. Likely DI overnight  P:   Monitor renal fx, urine outpt BMET in AM Replace electrolytes as indicated. Match I/O with 1/2 NS.  GASTROINTESTINAL A:   Hematemesis episode in ER 6/21. Hx of GERD. Dysphagia.  P:   TF as per nutrition. Pepcid. Monitor for further hematemesis, xarelto and plavix on hold. BP improved.    HEMATOLOGIC A:   Chronic anticoagulation in setting AF (on xarelto)  P:  F/u CBC. SCDs. Anticoagulation per neurology.  INFECTIOUS A:   No evidence for infection.  P:   Monitor clinically Hold abx.  ENDOCRINE A:   Hyperglycemia with hx of DM2.    P:   Beta-hydroxybutyric acid 0.10 (nl). KVO IVF. Insulin gtt.  FAMILY  - Updates: Large family bedside being updated by neurology.  Discussed with neuro, will perform perfusion study today, will not order AM labs.  - Inter-disciplinary family meet or Palliative Care meeting due by:  6/28  The patient is critically ill with multiple organ systems failure and requires high complexity decision making for assessment and support, frequent evaluation and titration of therapies, application of advanced monitoring technologies and extensive interpretation of multiple databases.   Critical Care Time devoted to patient care services  described in this note is  35  Minutes. This time reflects time of care of this signee Dr Koren Bound. This critical care time does not reflect procedure time, or teaching time or supervisory time of PA/NP/Med student/Med Resident etc but could involve care discussion time.  Alyson Reedy, M.D. [facility] Pulmonary/Critical Care Medicine. Pager: [phone]. After hours pager: [phone].  04/16/2016 10:14 AM

## 2016-04-16 NOTE — Progress Notes (Signed)
PT Cancellation Note  Patient Details Name: Sean Hinton MRN: [id number] DOB: 11-23-1960   Cancelled Treatment:    Reason Eval/Treat Not Completed: Medical issues which prohibited therapy (Sign off at this time.  Please reorder if status changes. )Thanks.    Tawni Millers F 04/16/2016, 8:57 AM  [facility] [phone] (pager)

## 2016-04-16 NOTE — Progress Notes (Signed)
   04/16/16 1500  Clinical Encounter Type  Visited With Patient;Patient and family together  Visit Type Follow-up;Spiritual support;Patient actively dying  Referral From Family  Consult/Referral To Chaplain  Spiritual Encounters  Spiritual Needs Prayer;Emotional;Grief support  Stress Factors  Patient Stress Factors Exhausted;Health changes;Loss of control;Major life changes  Family Stress Factors Exhausted;Family relationships;Health changes;Loss of control;Major life changes   Chaplain visited with pt and family.  Chaplain offered ministry of hospitality to family.  Chaplain prayed for pt and family.  Sean Hinton 04/16/2016 3:50 PM

## 2016-04-16 NOTE — Progress Notes (Signed)
eLink Physician-Brief Progress Note Patient Name: Sean Hinton DOB: [birth year] MRN: [id number]   Date of Service  04/16/2016  HPI/Events of Note  Contacted by nurse regarding hypokalemia. Serum sodium has rapidly corrected as well. Currently on KVO IV fluid. Creatinine 1.5. Peripheral IV access.   eICU Interventions  1. KCl 10 mEq IV 6 runs      Intervention Category Intermediate Interventions: Electrolyte abnormality - evaluation and management  Lawanda Cousins 04/16/2016, 5:47 AM

## 2016-04-16 NOTE — Progress Notes (Signed)
Had second neuro exam today at 2:45pm. No response to voice or pain. Pupil left 4.73mm and right 3.92mm, no pupillary response. No doll's eyes. No corneal or gag or cough. Not breathing over the went. Had cold caloric testing no nystagmus seen.   Pt has been off propofol more than 24 hours with normal to slightly elevated Cre. Normal temp and MBP > . Clinically. Na 156. He is clinically brain dead.   Will do perfusion study to confirm the brain death.   Marvel Plan, MD PhD Stroke Neurology 04/16/2016 3:06 PM

## 2016-04-16 NOTE — Progress Notes (Signed)
STROKE TEAM PROGRESS NOTE   SUBJECTIVE (INTERVAL HISTORY) Fiance at bedside. Patient neuro worsening. No more gag or cough, not breathing over the vent. Not moving extremities, but LLE had flicker knee movement on strong pain stimulation. Near brain death, will need another neuro exam in the afternoon. CCM not able to perform apnea test due to high CO2 level. Will do perfusion study if indicated. BP at 98/67 this am. Stable on Neo. UOP decreased and Na 156.    OBJECTIVE Temp:  [97.7 F (36.5 C)-101.1 F (38.4 C)] 97.7 F (36.5 C) (06/23 0730) Pulse Rate:  [92-131] 92 (06/23 0747) Cardiac Rhythm:  [-] Normal sinus rhythm (06/23 0730) Resp:  [14-16] 14 (06/23 0747) BP: (82-127)/(48-98) 83/60 mmHg (06/23 0747) SpO2:  [96 %-100 %] 98 % (06/23 0747) FiO2 (%):  [40 %] 40 % (06/23 0747) Weight:  [77.8 kg (171 lb 8.3 oz)] 77.8 kg (171 lb 8.3 oz) (06/23 0424)  CBC:   Recent Labs Lab April 17, 2016 0924 04/15/16 0435 04/16/16 0420  WBC 10.2 13.9* 15.2*  NEUTROABS 7.5 10.3*  --   HGB 12.6* 13.9 13.3  HCT 38.4* 43.4 42.8  MCV 82.6 84.6 87.5  PLT 149* 144* 94*    Basic Metabolic Panel:   Recent Labs Lab 04/15/16 0435  04/15/16 1450 04/16/16 0420  NA 164*  < > 167* 156*  K 3.7  < > 3.5 2.9*  CL >130*  < > >130* 128*  CO2 27  < > 28 25  GLUCOSE 133*  < > 406* 117*  BUN 10  < > 8 10  CREATININE 1.11  < > 1.39* 1.53*  CALCIUM 9.6  < > 9.3 8.5*  MG 2.3  --   --  1.8  PHOS <1.0*  --   --  4.4  < > = values in this interval not displayed.  Lipid Panel:     Component Value Date/Time   CHOL 176 04/15/2016 0634   TRIG 68 04/15/2016 0634   HDL 47 04/15/2016 0634   CHOLHDL 3.7 04/15/2016 0634   VLDL 14 04/15/2016 0634   LDLCALC 115* 04/15/2016 0634   HgbA1c:  Lab Results  Component Value Date   HGBA1C 13.4* 01/26/2016   Urine Drug Screen:     Component Value Date/Time   LABOPIA NONE DETECTED 04-17-16 0249   COCAINSCRNUR POSITIVE* April 17, 2016 0249   LABBENZ NONE DETECTED  April 17, 2016 0249   AMPHETMU NONE DETECTED Apr 17, 2016 0249   THCU NONE DETECTED 04/17/16 0249   LABBARB NONE DETECTED 04/17/16 0249      IMAGING  Ct Head Wo Contrast 04-17-16  1. Widespread new abnormal right MCA territory gyral edema with dense post intervention hyperenhancement +/- parenchymal hemorrhage. 2. Associated new intracranial mass effect with mild effacement of the right lateral ventricle and 2-3 mm of leftward midline shift.  April 17, 2016   No acute intracranial hemorrhage. Focal high attenuation in the M1 segment of the right MCA with possible mild edema in the right centrum semiovale. Findings concerning for right MCA thrombus and possible right MCA territory acute ischemia. Right occipital old infarct and encephalomalacia.   Ct Angio Head & Neck W Or Wo Contrast 2016-04-17  Extremely limited assessment due to technical difficulties. Non angiographic phase. In addition, streak artifact from bulky calcified stylohyoid ligaments limits evaluation of the carotid bifurcations . RIGHT internal artery occlusion to the level of the carotid terminus with minimal contrast within RIGHT anterior and middle cerebral artery. Bilateral pleural effusions.   Ct Cervical Spine Wo  Contrast 05-03-16    No acute/traumatic cervical spine pathology.   Cerebral angiogram May 03, 2016 Bilateral common carotid arteriograms and RT vert artery angiogram followed by endovascular complete revascularizationof occluded RT ICA prox and distally at the terminus ,the RT MCA and RT ACA with x 1pass with Solitaire FR 4 mmx 40 mm Retrieval device and 32m,g of IA integrelin intracranially..Complete revascularization of RT ICA RT MCA TICI 2b revascularization Also noted scattered smooth areas of narrowing of MCA and ACA branches ?vasospasm chemical related  MRI brain 04/15/2016 1. Large confluent ischemic infarct involving nearly the entirety of the right MCA territory with associated petechial hemorrhage  throughout the area of infarction. 2. Markedly worsened right to left shift now measuring up to 16 mm with right uncal and parafalcine herniation. Findings suspicious for ventricular trapping involving the left lateral ventricle. 3. Additional ischemic infarcts involving the right ACA territory and bilateral PCA territories, including the midbrain and pons as well as the medial left thalamus, likely related to herniation.  4. Small volume patchy acute left MCA territory infarcts, with additional tiny right cerebellar infarct.  Dg Chest Port 1 View 04/15/2016 Endotracheal tube remains in good position. Persistent right lower lobe opacity most consistent with right lower lobe partial collapse. The right heart border is well outlined by air density raising the possibility of basilar pneumothorax. However, no pleural reflection is identified and this is a semi upright view. This appearance could also be seen with right lower lobe collapse. Continued follow-up is suggested. 03-May-2016   1. Mild right perihilar opacity raises question for pneumonia. 2. Known small right and trace left pleural effusions are not well seen on this study. 3. Mild cardiomegaly.   TTE  - Left ventricle: Inferior wall hypokineiss apical and septal akinesis The cavity size was moderately dilated. Wall thickness was normal. Systolic function was moderately to severely reduced. The estimated ejection fraction was in the range of 30% to 35%. Left ventricular diastolic function parameters were normal. - Mitral valve: Eccentric ischemic MR. There was severe regurgitation. - Left atrium: The atrium was moderately to severely dilated. - Right ventricle: The cavity size was mildly dilated. - Right atrium: The atrium was mildly dilated. - Atrial septum: No defect or patent foramen ovale was identified. - Pulmonary arteries: PA peak pressure: 45 mm Hg (S).   PHYSICAL EXAM General - Well nourished, well developed, intubated and on  sedation.  Ophthalmologic - Fundi not visualized due to ET positioning.  Cardiovascular - Regular rate and rhythm.  Neuro - intubated and off sedation since yesterday, not open eyes or following any commands. Eyes middle position, no doll's eyes, pupils 3 mm on the right and 3.83mm on the left, not responsive to light, no corneal, no gag or cough and not breathing over the vent. On pain stimulation, no withdraw in all 4 extremities except flicker movement of left knee. No babinski and DTR diminished. Sensation, coordination or gait not tested.   ASSESSMENT/PLAN Mr. Conrado Lown is a 55 y.o. male with history of diabetes mellitus, hypertension, hyperlipidemia, atrial fibrillation on Xaralto, coronary artery disease, previous stroke and tobacco and cocaine abuse presenting with left sided weakness, numbness and neglect.  He did not receive IV t-PA due to being on xarelto. He was taken to IR where he had complete revascularization of occluded R ICA terminus and, TICI2b revascularization of R MCA.    Stroke:  Non-dominant right ICA infarct (entire R MCA, ACA), bilateral PCAs and punctuate L MCA infarcts, embolic due to  hx atrial fibrillation not compliance with xarelto vs cardiomyopathy d/t cocaine use vs. Right ICA stenosis/occlusion. S/p revascularization of occluded R ICA terminus, and TICI2b revascularization of R MCA.  Developed hemorrhagic transformation and mass effect (cerebral edema) with 40mm R shift with uncal and parafalcine herniation, likely contributing to additional infarcts.  CTA H&N right ICA occlusion with minimal contrast right ACA and MCA.   Post IR CT with widespread right MCA gyral edema with likely dense contrast staining (vs hemorrage). New mass effect with 3 mm left shift.   MRI  right ICA infarct (entire R MCA, ACA and some PCA) and L MCA and PCA infarcts. hemorrhagic transformation. 51mm R shift with uncal and parafalcine herniation, ventricular traping.   2D Echo  EF  30-35%  LDL 115  HgbA1c pending, 13.4 in April   UDS cocaine positive  SCDs for VTE prophylaxis Diet NPO time specified  clopidogrel 75 mg daily and Xarelto (rivaroxaban) daily prior to admission, now on No antithrombotic due to hemorrhagic transformation on MRI.   DNR  Fiance considering comfort care based on progress over the next few days.   Brain herniation  Left right MCA and ACA, b/l PCA infarcts  Significant RTL midline shift  B/l PCA infarct likely due to uncal herniation  Unequal pupil and lack of brainstem reflexes  Near brain death  Will need second neuro exam this pm and consider perfusion study if indicated.  Respiratory failure  Intubated   Off propofol for 24 hours now  No breathing over the vent  Diabetes insipidus  UOP improved over night  Na 164 -> 156  On desmopressin  CCM managing  D5 infusion and fluid resuscitation  Atrial Fibrillation  Home anticoagulation:  Xarelto (rivaroxaban) daily   Likely noncompliance  Currently off AC due to hemorrhagic transformation  Cardiomyopathy   EF 30-35%  Follows with cardiology for CHF  Poor prognosis due to continued cocaine use as per cardiology notes  On Xarelto at home but likely noncompliance  CAD s/p CABG and stent  MI, CABG 02/2015, stent 10/2015  Follows with cardiology  On plavix  plavix on hold due to hemorrhagic transformation  Diabetes, poorly controlled  Glucose > 600 in ED  Off insulin gtt now  HgbA1c pending, goal < 7.0  On lantus  SSI  CCM on board   Hypertensive emergency hypotension  BP 149/124 on arrival in setting of neurologic symptoms  Started on cardene for BP control, now off  SBP goal 120-140 post IR   BP low and put on neo  Hyperlipidemia  Home meds:  lipitor 80  LDL 115, goal < 70  Resumed lipitor 80  Other Stroke Risk Factors  Cigarette smoker  Chronic Cocaine abuse, UDS positive this admission  Family hx stroke  (brother)  PVD  obstructive sleep apnea    Other Active Problems  Chronic kidney disease stage II 1.04  hematemesis in the ER on pepcid  hypokalemia  Hypernatremia 164  hyperchloridemia   Hypophosphatemia  Hospital day # 2  This patient is critically ill due to right ICA infarct s/p IR, afib on Xarelto, CAD/MI on plavix s/p CABG and stent, hyperglycemia on insulin drip and at significant risk of neurological worsening, death form hemorrhagic transformation, recurrent stroke, heart failure, DKA, brain herniation. This patient's care requires constant monitoring of vital signs, hemodynamics, respiratory and cardiac monitoring, review of multiple databases, neurological assessment, discussion with family, other specialists and medical decision making of high complexity. I spent 45 minutes  of neurocritical care time in the care of this patient.  Had discussion with fiance at bedside. Pt near brain death and will need repeat neuro exam this pm and possible perfusion study. Currently I still see a flicker knee movement at left on pain stimulation. Fiance wants to continue everything even this is very weak sign of life. However, as per fiance, pt would not want to live in any condition of vegetative state or worse. I asked fiance to make decision by considering what pt really wants instead of what she wants, she got upset with me and stated that she is the POA and she has the final words. I laughed at her statement that she has final say regardless what pt wishes. However, she accused me of laughing at a dying pt. I told her that I will come back to repeat my neuro exam and will do further confirmatory test if indicated. She expressed understanding.  Marvel Plan, MD PhD Stroke Neurology 04/16/2016 12:36 PM    To contact Stroke Continuity provider, please refer to [email or web address]. After hours, contact General Neurology

## 2016-04-17 DIAGNOSIS — I639 Cerebral infarction, unspecified: Secondary | ICD-10-CM

## 2016-04-17 DIAGNOSIS — I63231 Cerebral infarction due to unspecified occlusion or stenosis of right carotid arteries: Secondary | ICD-10-CM | POA: Insufficient documentation

## 2016-04-17 DIAGNOSIS — J9601 Acute respiratory failure with hypoxia: Secondary | ICD-10-CM

## 2016-04-17 DIAGNOSIS — G9382 Brain death: Secondary | ICD-10-CM | POA: Insufficient documentation

## 2016-04-17 LAB — GLUCOSE, CAPILLARY
GLUCOSE-CAPILLARY: 220 mg/dL — AB (ref 65–99)
GLUCOSE-CAPILLARY: 221 mg/dL — AB (ref 65–99)
GLUCOSE-CAPILLARY: 222 mg/dL — AB (ref 65–99)
Glucose-Capillary: 176 mg/dL — ABNORMAL HIGH (ref 65–99)
Glucose-Capillary: 227 mg/dL — ABNORMAL HIGH (ref 65–99)
Glucose-Capillary: 281 mg/dL — ABNORMAL HIGH (ref 65–99)
Glucose-Capillary: 350 mg/dL — ABNORMAL HIGH (ref 65–99)

## 2016-04-17 MED ORDER — INSULIN ASPART 100 UNIT/ML ~~LOC~~ SOLN
0.0000 [IU] | SUBCUTANEOUS | Status: DC
  Administered 2016-04-17: 15 [IU] via SUBCUTANEOUS
  Administered 2016-04-18: 7 [IU] via SUBCUTANEOUS

## 2016-04-17 NOTE — Progress Notes (Signed)
STROKE TEAM PROGRESS NOTE    HISTORY ON ADMISSION Sean Hinton is an 55 y.o. male history diabetes mellitus, hypertension, hyperlipidemia, atrial fibrillation on Xaralto, coronary artery disease, previous stroke and tobacco and cocaine abuse, brought to the emergency room in code stroke status following acute onset of weakness involving his left side, as well as numbness and neglect. Patient began complaining of numbness involving his left arm then loss consciousness briefly. When he woke up he was noted to have left facial droop, garbled speech and inability to move his left extremities. He was last known well at 12:45 AM today. CT scan of his head showed old right PCA territory stroke. There were no acute changes, including no intracranial hemorrhage. NIH stroke score was 21. Patient was not deemed a candidate for TPA because he takes [place]. His INR was 1.45. Patient has also been on Plavix 75 mg per day. CT angiogram was somewhat inconclusive for intracranial large vessel occlusion but indicated right MCA and ACA flow abnormalities, in addition to right ICA occlusion demonstrated on cervical CTA. Patient's blood sugar was markedly elevated at 724. IV insulin drip was started. Blood pressure was also elevated requiring intervention with hydralazine. Urine drug screen was positive for cocaine.   Interventional radiologist was consulted and agreed to perform catheter cerebral angiogram and possible endovascular revascularization procedures, but after blood sugar was under better control. At 4:30 AM patient's blood sugar was 506.  LSN: 12:45 AM on 23-Apr-2016 tPA Given: No: on Xaralto for anticoagulation mRankin:    SUBJECTIVE (INTERVAL HISTORY) Fiance at bedside. In discussion with fianc that radionucleotide angiogram vascular flow showed absent intracranial blood flow consistent with brain death. Terminal extubation to follow tomorrow at 3 PM after other family members have said  goodbye.   OBJECTIVE Temp:  [96.6 F (35.9 C)-97.5 F (36.4 C)] 97.5 F (36.4 C) (06/24 0729) Pulse Rate:  [87-96] 95 (06/24 0729) Cardiac Rhythm:  [-] Normal sinus rhythm (06/24 0301) Resp:  [14-15] 14 (06/24 0729) BP: (88-111)/(65-91) 99/70 mmHg (06/24 0729) SpO2:  [91 %-100 %] 97 % (06/24 0729) FiO2 (%):  [30 %-40 %] 30 % (06/24 0729) Weight:  [78.7 kg (173 lb 8 oz)] 78.7 kg (173 lb 8 oz) (06/24 0500)  CBC:   Recent Labs Lab 2016-04-23 0924 04/15/16 0435 04/16/16 0420  WBC 10.2 13.9* 15.2*  NEUTROABS 7.5 10.3*  --   HGB 12.6* 13.9 13.3  HCT 38.4* 43.4 42.8  MCV 82.6 84.6 87.5  PLT 149* 144* 94*    Basic Metabolic Panel:   Recent Labs Lab 04/15/16 0435  04/15/16 1450 04/16/16 0420  NA 164*  < > 167* 156*  K 3.7  < > 3.5 2.9*  CL >130*  < > >130* 128*  CO2 27  < > 28 25  GLUCOSE 133*  < > 406* 117*  BUN 10  < > 8 10  CREATININE 1.11  < > 1.39* 1.53*  CALCIUM 9.6  < > 9.3 8.5*  MG 2.3  --   --  1.8  PHOS <1.0*  --   --  4.4  < > = values in this interval not displayed.  Lipid Panel:     Component Value Date/Time   CHOL 176 04/15/2016 0634   TRIG 68 04/15/2016 0634   HDL 47 04/15/2016 0634   CHOLHDL 3.7 04/15/2016 0634   VLDL 14 04/15/2016 0634   LDLCALC 115* 04/15/2016 0634   HgbA1c:  Lab Results  Component Value Date   HGBA1C >15.5*  2016/04/29   Urine Drug Screen:     Component Value Date/Time   LABOPIA NONE DETECTED 04/16/2016 0249   COCAINSCRNUR POSITIVE* 04/07/2016 0249   LABBENZ NONE DETECTED 04/20/2016 0249   AMPHETMU NONE DETECTED 04/01/2016 0249   THCU NONE DETECTED 03/31/2016 0249   LABBARB NONE DETECTED 04/13/2016 0249      IMAGING  Ct Head Wo Contrast 04/03/2016   1. Widespread new abnormal right MCA territory gyral edema with dense post intervention hyperenhancement +/- parenchymal hemorrhage.  2. Associated new intracranial mass effect with mild effacement of the right lateral ventricle and 2-3 mm of leftward midline shift.    04/08/2016    No acute intracranial hemorrhage. Focal high attenuation in the M1 segment of the right MCA with possible mild edema in the right centrum semiovale. Findings concerning for right MCA thrombus and possible right MCA territory acute ischemia. Right occipital old infarct and encephalomalacia.   Ct Angio Head & Neck W Or Wo Contrast 04/03/2016  Extremely limited assessment due to technical difficulties. Non angiographic phase. In addition, streak artifact from bulky calcified stylohyoid ligaments limits evaluation of the carotid bifurcations . RIGHT internal artery occlusion to the level of the carotid terminus with minimal contrast within RIGHT anterior and middle cerebral artery. Bilateral pleural effusions.   Ct Cervical Spine Wo Contrast 04/17/2016     No acute/traumatic cervical spine pathology.    Cerebral angiogram 03/28/2016 Bilateral common carotid arteriograms and RT vert artery angiogram followed by endovascular complete revascularizationof occluded RT ICA prox and distally at the terminus ,the RT MCA and RT ACA with x 1pass with Solitaire FR 4 mmx 40 mm Retrieval device and 25m,g of IA integrelin intracranially..Complete revascularization of RT ICA RT MCA TICI 2b revascularization Also noted scattered smooth areas of narrowing of MCA and ACA branches ?vasospasm chemical related   MRI brain Apr 29, 2016 1. Large confluent ischemic infarct involving nearly the entirety of the right MCA territory with associated petechial hemorrhage throughout the area of infarction. 2. Markedly worsened right to left shift now measuring up to 16 mm with right uncal and parafalcine herniation. Findings suspicious for ventricular trapping involving the left lateral ventricle. 3. Additional ischemic infarcts involving the right ACA territory and bilateral PCA territories, including the midbrain and pons as well as the medial left thalamus, likely related to herniation.  4. Small volume patchy  acute left MCA territory infarcts, with additional tiny right cerebellar infarct.   NM BRAIN SCAN WITH FLOW - 4+ VIEW 04/16/2016 Absent intracranial blood flow and absent delayed localization of Ceretec consistent with brain death.   Dg Chest Port 1 View 04-29-16 Endotracheal tube remains in good position. Persistent right lower lobe opacity most consistent with right lower lobe partial collapse. The right heart border is well outlined by air density raising the possibility of basilar pneumothorax. However, no pleural reflection is identified and this is a semi upright view. This appearance could also be seen with right lower lobe collapse. Continued follow-up is suggested. 04/02/2016   1. Mild right perihilar opacity raises question for pneumonia. 2. Known small right and trace left pleural effusions are not well seen on this study. 3. Mild cardiomegaly.   TTE  - Left ventricle: Inferior wall hypokineiss apical and septal akinesis The cavity size was moderately dilated. Wall thickness was normal. Systolic function was moderately to severely reduced. The estimated ejection fraction was in the range of 30% to 35%. Left ventricular diastolic function parameters were normal. - Mitral valve: Eccentric ischemic MR.  There was severe regurgitation. - Left atrium: The atrium was moderately to severely dilated. - Right ventricle: The cavity size was mildly dilated. - Right atrium: The atrium was mildly dilated. - Atrial septum: No defect or patent foramen ovale was identified. - Pulmonary arteries: PA peak pressure: 45 mm Hg (S).   PHYSICAL EXAM: Exam is stable today, consistent with brain death. General - Well nourished, well developed, intubated and on sedation.  Ophthalmologic - Fundi not visualized due to ET positioning.  Cardiovascular - Regular rate and rhythm.  Neuro - intubated and off sedation since yesterday, not open eyes or following any commands. Eyes middle position, no doll's  eyes, pupils 3 mm on the right and 3.64mm on the left, not responsive to light, no corneal, no gag or cough and not breathing over the vent. On pain stimulation, no withdraw in all 4 extremities. No babinski and DTR diminished. Sensation, coordination or gait not tested.    ASSESSMENT/PLAN Mr. Sean Hinton is a 55 y.o. male with history of diabetes mellitus, hypertension, hyperlipidemia, atrial fibrillation on Xaralto, coronary artery disease, previous stroke and tobacco and cocaine abuse presenting with left sided weakness, numbness and neglect.  He did not receive IV t-PA due to being on xarelto. He was taken to IR where he had complete revascularization of occluded R ICA terminus and, TICI2b revascularization of R MCA.     Stroke:  Non-dominant right ICA infarct (entire R MCA, ACA), bilateral PCAs and punctuate L MCA infarcts, embolic due to hx atrial fibrillation not compliance with xarelto vs cardiomyopathy d/t cocaine use vs. Right ICA stenosis/occlusion. S/p revascularization of occluded R ICA terminus, and TICI2b revascularization of R MCA.  Developed hemorrhagic transformation and mass effect (cerebral edema) with 22mm R shift with uncal and parafalcine herniation, likely contributing to additional infarcts.  CTA H&N right ICA occlusion with minimal contrast right ACA and MCA.   Post IR CT with widespread right MCA gyral edema with likely dense contrast staining (vs hemorrage). New mass effect with 3 mm left shift.   MRI  right ICA infarct (entire R MCA, ACA and some PCA) and L MCA and PCA infarcts. hemorrhagic transformation. 66mm R shift with uncal and parafalcine herniation, ventricular traping.   2D Echo  EF 30-35%  LDL 115  HgbA1c - >15.5  UDS cocaine positive  SCDs for VTE prophylaxis Diet NPO time specified  clopidogrel 75 mg daily and Xarelto (rivaroxaban) daily prior to admission, now on No antithrombotic due to hemorrhagic transformation on MRI.   DNR  Fiance  considering comfort care based on progress over the next few days.   Brain herniation  Left right MCA and ACA, b/l PCA infarcts  Significant RTL midline shift  B/l PCA infarct likely due to uncal herniation  Unequal pupil and lack of brainstem reflexes  NM BRAIN SCAN WITH FLOW - 4+ VIEW - 04/16/2016  Absent intracranial blood flow and absent delayed localization of Ceretec consistent with brain death.  Respiratory failure  Intubated   Off propofol for 24 hours now  No breathing over the vent  Diabetes insipidus  UOP improved over night  Na 164 -> 156  On desmopressin  CCM managing  D5 infusion and fluid resuscitation  Atrial Fibrillation  Home anticoagulation:  Xarelto (rivaroxaban) daily   Likely noncompliance  Currently off AC due to hemorrhagic transformation  Cardiomyopathy   EF 30-35%  Follows with cardiology for CHF  Poor prognosis due to continued cocaine use as per cardiology notes  On Xarelto at home but likely noncompliance  CAD s/p CABG and stent  MI, CABG 02/2015, stent 10/2015  Follows with cardiology  On plavix  plavix on hold due to hemorrhagic transformation  Diabetes, poorly controlled  Glucose > 600 in ED  Off insulin gtt now  HgbA1c > 15.5, goal < 7.0  On lantus  SSI  CCM on board   Hypertensive emergency hypotension  BP 149/124 on arrival in setting of neurologic symptoms  Started on cardene for BP control, now off  SBP goal 120-140 post IR   BP low and put on neo  Hyperlipidemia  Home meds:  lipitor 80  LDL 115, goal < 70  Resumed lipitor 80  Other Stroke Risk Factors  Cigarette smoker  Chronic Cocaine abuse, UDS positive this admission  Family hx stroke (brother)  PVD  obstructive sleep apnea    Other Active Problems  Chronic kidney disease stage II 1.04  hematemesis in the ER on pepcid  hypokalemia  Hypernatremia 164  hyperchloridemia   Hypophosphatemia  Hospital day #  3  Had discussion with fiance at bedside. Plan for terminal extubation tomorrow at 3 PM.   Personally examined patient and images, and have participated in and made any corrections needed to history, physical, neuro exam,assessment and plan as stated above.  I have personally obtained the history, evaluated lab date, reviewed imaging studies and agree with radiology interpretations.    Naomie Dean, MD Stroke Neurology [phone] [facility]       To contact Stroke Continuity provider, please refer to [email or web address]. After hours, contact General Neurology

## 2016-04-17 NOTE — Progress Notes (Signed)
eLink Physician-Brief Progress Note Patient Name: Sean Hinton DOB: 21-Jun-1961 MRN: [id number]   Date of Service  04/17/2016  HPI/Events of Note  Liquid stool. Request for Flexiseal.   eICU Interventions  Will order Flexiseal.      Intervention Category Minor Interventions: Routine modifications to care plan (e.g. PRN medications for pain, fever)  Bandy Honaker [place] 04/17/2016, 8:59 PM

## 2016-04-17 NOTE — Progress Notes (Signed)
eLink Physician-Brief Progress Note Patient Name: Dakin Madani DOB: 1961-02-04 MRN: [id number]   Date of Service  04/17/2016  HPI/Events of Note  Blood glucose = 350.   eICU Interventions  Will change to Q 4 hour resistant Novolog SSI.      Intervention Category Intermediate Interventions: Hyperglycemia - evaluation and treatment  Jacqualine Weichel Dennard Nip 04/17/2016, 7:46 PM

## 2016-04-17 NOTE — Progress Notes (Signed)
PULMONARY / CRITICAL CARE MEDICINE   Name: Sean Hinton MRN: [id number] DOB: [birth year]    ADMISSION DATE:  03/26/2016 CONSULTATION DATE:  04/20/2016  REFERRING MD:  Dr. Blinda Leatherwood  CHIEF COMPLAINT:  Weakness  HISTORY OF PRESENT ILLNESS:   55 yo male smoker had syncopal episode at home witness by his wife.  He had Rt sided headache.  He was noted to have Rt sided gaze, slurred speech, and Lt side flaccid.  His blood sugar was > 600.  His significant other was not sure if he has been taking his medications.  Code stroke activated, and he was seen by neurology.  His UDS was positive for cocaine.  He had episode of vomiting blood in the ER.  He was started on insulin gtt.  Planning to go to IR for potential revascularization. PCCM consulted to assist with medical management in ICU.  SUBJECTIVE:  RN reports perfusion study shows no cerebral flow.  No acute events overnight.   VITAL SIGNS: BP 79/65 mmHg  Pulse 92  Temp(Src) 97.7 F (36.5 C) (Core (Comment))  Resp 14  Ht 5\' 9"  (1.753 m)  Wt 173 lb 8 oz (78.7 kg)  BMI 25.61 kg/m2  SpO2 93%  HEMODYNAMICS:    VENTILATOR SETTINGS: Vent Mode:  [-] PRVC FiO2 (%):  [30 %-40 %] 30 % Set Rate:  [14 bmp] 14 bmp Vt Set:  [610 mL] 610 mL PEEP:  [5 cmH20] 5 cmH20 Plateau Pressure:  [19 cmH20-23 cmH20] 21 cmH20  INTAKE / OUTPUT: I/O last 3 completed shifts: In: 11917.8 [I.V.:10157.8; Other:10; NG/GT:1350; IV Piggyback:400] Out: 305 [Urine:305]  PHYSICAL EXAMINATION: General:  Male of normal body habitus in NAD Neuro:  Unresponsive, negative neurologic exam and no respiratory drive. HEENT:  [place]/AT, no appreciable JVD Cardiovascular:  Tachy, regular, no MRG Lungs:  Clear bilateral breath sounds Abdomen:  Soft, non-tender, non-distended Musculoskeletal:  No acute deformity Skin:  Grossly intact  LABS:  BMET  Recent Labs Lab 04/15/16 1215 04/15/16 1450 01-May-2016 0420  NA 175* 167* 156*  K 3.8 3.5 2.9*  CL >130* >130* 128*  CO2  31 28 25   BUN 9 8 10   CREATININE 1.32* 1.39* 1.53*  GLUCOSE 187* 406* 117*   Electrolytes  Recent Labs Lab 04/15/16 0435 04/15/16 1215 04/15/16 1450 05-01-16 0420  CALCIUM 9.6 10.0 9.3 8.5*  MG 2.3  --   --  1.8  PHOS <1.0*  --   --  4.4   CBC  Recent Labs Lab 04/08/2016 0924 04/15/16 0435 01-May-2016 0420  WBC 10.2 13.9* 15.2*  HGB 12.6* 13.9 13.3  HCT 38.4* 43.4 42.8  PLT 149* 144* 94*   Coag's  Recent Labs Lab 04/07/2016 0143  APTT 30  INR 1.45   Sepsis Markers No results for input(s): LATICACIDVEN, PROCALCITON, O2SATVEN in the last 168 hours.  ABG  Recent Labs Lab 04/09/2016 1000 04/15/16 0350 05/01/16 0320  PHART 7.312* 7.400 7.356  PCO2ART 45.3* 44.9 46.5*  PO2ART 233* 91.5 283*   Liver Enzymes  Recent Labs Lab 04/04/2016 0143  AST 32  ALT 38  ALKPHOS 198*  BILITOT 1.4*  ALBUMIN 2.8*   Cardiac Enzymes No results for input(s): TROPONINI, PROBNP in the last 168 hours.  Glucose  Recent Labs Lab 05/01/16 1136 May 01, 2016 1611 05-01-16 1941 May 01, 2016 2352 04/17/16 0335 04/17/16 0828  GLUCAP 123* 176* 222* 221* 220* 222*   Imaging Nm Brain W Vasc Flow Min 4v  05-01-2016  CLINICAL DATA:  Brain death EXAM: NM BRAIN SCAN WITH  FLOW - 4+ VIEW TECHNIQUE: Radionuclide angiogram and static images of the brain were obtained after intravenous injection of radiopharmaceutical. RADIOPHARMACEUTICALS:  21.7 millicuries Tc-14m HMPAO IV COMPARISON:  None FINDINGS: Blood flow images demonstrate expected blood flow to the scalp. No intracranial blood flow is identified. Delayed static images demonstrate no intra cerebral localization of HMPAO. Above findings are consistent with those described with brain death. IMPRESSION: Absent intracranial blood flow and absent delayed localization of Ceretec consistent with brain death. Electronically Signed   By: Ulyses Southward M.D.   On: 04/16/2016 20:49   STUDIES:  6/21  CT head >> high attenuation M1 of Rt MCA, old Rt occipital  infarct 6/21  CT angio head/neck >> Rt internal carotid artery occlusion to level of carotid terminus 6/23  Cerebral Flow NM >> absent intracranial blood flow and absent delayed localization  CULTURES: 6/21 Urine >> neg  ANTIBIOTICS: None  SIGNIFICANT EVENTS: 6/21  Admit 6/23  Cerebral perfusion study demonstrates no blood flow  LINES/TUBES: ETT 6/21>>>  DISCUSSION: 55 yo male smoker with slurred speech, Rt gaze, Lt sided weakness from Rt MCA CVA.  UDS positive for cocaine, hyperglycemia with CBG > 600, hematemesis.  PCCM asked to assist with medical management in ICU.  He has hx of CVA, CAD, ischemic CM (EF 40%), A fib on xarelto, mod MR, HTN, DM, GERD, OSA.  ASSESSMENT / PLAN:  NEUROLOGIC A:   Absent Cerebral Blood Flow - noted on perfusion study 6/23 Acute Rt MCA CVA with hx of prior CVA. L hemiplegia Cocaine abuse. DI  P:   Per neurology, and neuro-IR. Clinically consistent with brain death, NM perfusion confirms - see above Will discuss withdrawal of care with family when they arrive > understanding from RN is that family wants to wait to w/d care until 6/25 at 3PM (not sure why this specific time or if this is to allow others arrive).   PULMONARY A: Hx of OSA, tobacco abuse.  P:   Vent support, PRVC Monitor oxygenation. Prn BDs. Bronchial hygiene. Titrate O2 for sat of 88-92%.  CARDIOVASCULAR A:  HTN urgency Hx of CAD, chronic systolic CHF, HTN, A fib, mod MR, HLD. Neurogenic shock.  P:  Neo for MAP > 65  No further addition of vasopressors PRN hydralazine. No beta-blocker in setting cocaine. Hold outpt xarelto and plavix with concern for GI bleed. SBP target of 90 mmHg.  RENAL A:   CKD 2. Likely DI overnight  P:   Monitor renal fx, urine outpt Replace electrolytes as indicated. Match I/O with D51/2 NS. No further labs  GASTROINTESTINAL A:   Hematemesis episode in ER 6/21. Hx of GERD. Dysphagia.  P:   TF as per  nutrition. Pepcid. Monitor for further hematemesis, xarelto and plavix on hold. BP improved.   HEMATOLOGIC A:   Chronic anticoagulation in setting AF (on xarelto)  P:  SCDs. Anticoagulation per neurology.  INFECTIOUS A:   No evidence for infection.  P:   Monitor clinically Hold abx.  ENDOCRINE A:   Hyperglycemia with hx of DM2.    P:   KVO IVF. Insulin gtt.  FAMILY  - Updates: No family available am 6/24.  Will update on arrival.    - Inter-disciplinary family meet or Palliative Care meeting due by:  6/28   Canary Brim, NP-C [facility] Pgr: [phone] or if no answer [phone] 04/17/2016, 10:40 AM   Attending Note:  I have examined patient, reviewed labs, studies and notes. I have  discussed the case with B Ollis, and I agree with the data and plans as amended above. 55 yo man with hx cocaine and MMP, suffered embolic CVA c/b DI. Unfortunately has progressively worsened. No change ion neuro fxn. Perfusion scan shows no cerebral blood flow so he is clinically brain dead. We will undertake discussions with his girlfriend and brother regarding next steps in his care. Would like to arrange for withdrawal from MV at a time that accomodates the family. Independent critical care time is 31 minutes.   Levy Pupa, MD, PhD 04/17/2016, 12:39 PM [facility] [phone] or if no answer [phone]

## 2016-04-18 LAB — GLUCOSE, CAPILLARY: GLUCOSE-CAPILLARY: 222 mg/dL — AB (ref 65–99)

## 2016-04-18 MED ORDER — ARTIFICIAL TEARS OP OINT: TOPICAL_OINTMENT | Freq: Three times a day (TID) | OPHTHALMIC | Status: DC

## 2016-04-22 ENCOUNTER — Telehealth: Payer: Self-pay

## 2016-04-22 NOTE — Telephone Encounter (Signed)
DEATH CERTIFICATE RECEIVE FROM [facility].

## 2016-04-24 NOTE — Progress Notes (Signed)
  04/10/2016 4:32 AM    Patient is a DNR. He is asystole on the monitor. No heart tones auscultated. No palpable pulses. Pupils fixed and dilated. Death pronouned by Carlyon Prows RN and [facility] RN.  Dr Thornell Mule  Made aware. Chaplin made aware of patients death and possible need for family comfort. CDS notified. Patient is suitable for eyes and tissue. Eye prep done. Patient has no family at bedside. Unable to contact POA Baird Kay. Telephone numbers provided [phone] called multiple times. Patient has a second HCPOA  John Shimer (brother)  was made aware of patient's death. John Villena is also unable to get in contact with Ms Daphine Deutscher.    Baird Kay [phone]  Zannie Cove (brother ) [phone]; [phone]       Carlyon Prows RN

## 2016-04-24 NOTE — Progress Notes (Signed)
7:00 AM    04/22/2016   Sean Hinton has been notified of patients death.  Patient is not an ME/ Autopsy  case per Dr Jamison Neighbor. [name] declined an autopsy.  CDS made aware that patient's next of kin is aware of patients death.      Carlyon Prows RN

## 2016-04-24 NOTE — Discharge Summary (Signed)
Stroke Discharge Summary  Patient ID: Jaivien Merlos   MRN: [id number]      DOB: Feb 17, 1961  Date of Admission: 05-01-2016 Date of Discharge: 04/03/2016  Attending Physician:  No att. providers found, Stroke MD Consulting Physician(s):   Treatment Team:  Md Stroke, MD pulmonary/intensive care and Interventional Neuro Radiology Dr Molli Knock and Dr Corliss Skains Patient's PCP:  Lora Paula, MD  DISCHARGE DIAGNOSIS: Large Right MCA territory stroke Active Problems:   CVA (cerebral infarction)   Acute respiratory failure with hypoxia (HCC)   Acute encephalopathy   Brain death   Cerebrovascular accident (CVA) due to occlusion of right carotid artery (HCC)  BMI: Body mass index is 25.61 kg/(m^2).  Past Medical History  Diagnosis Date  . Diabetes mellitus with complication (HCC)   . Hypertension   . Syncope     a. 02/2015 - felt 2/2 rapid AF/AFL.  Marland Kitchen Atrial fibrillation and flutter (HCC)     a. Dx 02/2015 - placed on Xarelto; b. 09/2015 recurrence in setting of PNA.  . Ischemic cardiomyopathy     a. 09/2015 Echo: EF 35-40%.  Marland Kitchen CAD (coronary artery disease)     a. Dx 02/2015 - 2V CAD with CTO LAD, diffuse dz in PDA, OM - med rx.  b. cath 01/2016 no change in anatomy, medical therapy  . Hypertensive heart disease   . Hyperlipidemia   . Mitral regurgitation     a. Mod by echo 02/2015,  . Tobacco abuse   . Chronic systolic CHF (congestive heart failure) (HCC)     a. 09/2015 Echo: EF 35-40%, sev apical/antlat, apical HK, mild MR, mildly dil LA.  Marland Kitchen ARF (acute respiratory failure) (HCC)   . Pneumonia 09/2015  . GERD (gastroesophageal reflux disease)   . Headache   . Arthritis     ra  . NSTEMI (non-ST elevated myocardial infarction) (HCC) 10/2015  . Excessive daytime sleepiness 03/06/2016   Past Surgical History  Procedure Laterality Date  . Right thumb surgery     . Cardiac catheterization N/A 02/24/2015    Procedure: Left Heart Cath and Coronary Angiography;  Surgeon: Marykay Lex, MD;  Location: [facility] INVASIVE CV LAB CUPID;  Service: Cardiovascular;  Laterality: N/A;  . Cardiac catheterization  02/24/2015    Procedure: Coronary/Bypass Graft CTO Intervention;  Surgeon: Marykay Lex, MD;  Location: [facility] INVASIVE CV LAB CUPID;  Service: Cardiovascular;;  . Cardiac catheterization N/A 11/03/2015    Procedure: Left Heart Cath and Coronary Angiography;  Surgeon: Tonny Bollman, MD;  Location: [facility] INVASIVE CV LAB;  Service: Cardiovascular;  Laterality: N/A;  . Cardiac catheterization N/A 11/03/2015    Procedure: Coronary Stent Intervention;  Surgeon: Tonny Bollman, MD;  Location: [facility] INVASIVE CV LAB;  Service: Cardiovascular;  Laterality: N/A;  RAMUS  . Cardiac catheterization N/A 02/06/2016    Procedure: Left Heart Cath and Coronary Angiography;  Surgeon: Marykay Lex, MD;  Location: [facility] INVASIVE CV LAB;  Service: Cardiovascular;  Laterality: N/A;  . Radiology with anesthesia N/A 05/01/2016    Procedure: RADIOLOGY WITH ANESTHESIA;  Surgeon: Julieanne Cotton, MD;  Location: [facility] OR;  Service: Radiology;  Laterality: N/A;      Medication List    ASK your doctor about these medications        albuterol 108 (90 Base) MCG/ACT inhaler  Commonly known as:  PROVENTIL HFA;VENTOLIN HFA  Inhale 2 puffs into the lungs every 6 (six) hours as needed for wheezing or shortness of breath.  amiodarone 200 MG tablet  Commonly known as:  PACERONE  Take 1 tablet (200 mg total) by mouth daily.     atorvastatin 80 MG tablet  Commonly known as:  LIPITOR  Take 1 tablet (80 mg total) by mouth every evening.     clopidogrel 75 MG tablet  Commonly known as:  PLAVIX  Take 1 tablet (75 mg total) by mouth daily with breakfast.     Dexlansoprazole 30 MG capsule  Commonly known as:  DEXILANT  Take 1 capsule (30 mg total) by mouth daily.     digoxin 0.125 MG tablet  Commonly known as:  LANOXIN  Take 0.125 mg by mouth daily. Reported on 04-23-2016     diltiazem 240 MG 24 hr capsule   Commonly known as:  DILACOR XR  Take 240 mg by mouth daily. Reported on 2016/04/23     furosemide 80 MG tablet  Commonly known as:  LASIX  Take 1 tablet by mouth 2 (two) times daily.     glipiZIDE 10 MG tablet  Commonly known as:  GLUCOTROL  Take 10 mg by mouth 2 (two) times daily before a meal.     glucose blood test strip  Commonly known as:  TRUE METRIX BLOOD GLUCOSE TEST  Use as instructed     hydrALAZINE 25 MG tablet  Commonly known as:  APRESOLINE  Take 25 mg by mouth 3 (three) times daily.     insulin aspart 100 UNIT/ML FlexPen  Commonly known as:  NOVOLOG FLEXPEN  Before each meal 3 times a day, 140-199 - 2 units, 200-250 - 4 units, 251-299 - 6 units,  300-349 - 8 units,  350 or above 10 units. Insulin PEN if approved, provide syringes and needles if needed.     Insulin Glargine 100 UNIT/ML Solostar Pen  Commonly known as:  LANTUS SOLOSTAR  Inject 20 Units into the skin daily at 10 pm.     ipratropium-albuterol 0.5-2.5 (3) MG/3ML Soln  Commonly known as:  DUONEB  Take 3 mLs by nebulization 2 (two) times daily.     isosorbide mononitrate 30 MG 24 hr tablet  Commonly known as:  IMDUR  Take 1 tablet (30 mg total) by mouth daily.     lisinopril 10 MG tablet  Commonly known as:  PRINIVIL,ZESTRIL  Take 1 tablet (10 mg total) by mouth daily.     Medical Compression Stockings Misc  20 mm Hg pressure     nitroGLYCERIN 0.4 MG SL tablet  Commonly known as:  NITROSTAT  Place 1 tablet (0.4 mg total) under the tongue every 5 (five) minutes as needed for chest pain (up to 3 doses).     potassium chloride SA 20 MEQ tablet  Commonly known as:  K-DUR,KLOR-CON  Take 1 tablet (20 mEq total) by mouth 2 (two) times daily.     rivaroxaban 20 MG Tabs tablet  Commonly known as:  XARELTO  Take 1 tablet (20 mg total) by mouth daily with supper.     spironolactone 25 MG tablet  Commonly known as:  ALDACTONE  Take 25 mg by mouth 2 (two) times daily.     TRUE METRIX METER Devi   1 Device by Does not apply route 4 (four) times daily -  before meals and at bedtime.     TRUEPLUS LANCETS 28G Misc  1 each by Does not apply route 4 (four) times daily -  before meals and at bedtime.        LABORATORY STUDIES  CBC    Component Value Date/Time   WBC 15.2* 04/16/2016 0420   RBC 4.89 04/16/2016 0420   HGB 13.3 04/16/2016 0420   HCT 42.8 04/16/2016 0420   PLT 94* 04/16/2016 0420   MCV 87.5 04/16/2016 0420   MCH 27.2 04/16/2016 0420   MCHC 31.1 04/16/2016 0420   RDW 16.6* 04/16/2016 0420   LYMPHSABS 2.0 04/15/2016 0435   MONOABS 1.4* 04/15/2016 0435   EOSABS 0.1 04/15/2016 0435   BASOSABS 0.0 04/15/2016 0435   CMP    Component Value Date/Time   NA 156* 04/16/2016 0420   K 2.9* 04/16/2016 0420   CL 128* 04/16/2016 0420   CO2 25 04/16/2016 0420   GLUCOSE 117* 04/16/2016 0420   BUN 10 04/16/2016 0420   CREATININE 1.53* 04/16/2016 0420   CREATININE 1.14 01/05/2016 1210   CALCIUM 8.5* 04/16/2016 0420   PROT 5.9* 2016/05/13 0143   ALBUMIN 2.8* 05-13-2016 0143   AST 32 May 13, 2016 0143   ALT 38 2016-05-13 0143   ALKPHOS 198* 2016/05/13 0143   BILITOT 1.4* 05-13-2016 0143   GFRNONAA 51* 04/16/2016 0420   GFRNONAA 74 01/05/2016 1210   GFRAA 59* 04/16/2016 0420   GFRAA 85 01/05/2016 1210   COAGS Lab Results  Component Value Date   INR 1.45 May 13, 2016   INR 1.43 02/06/2016   INR 1.82* 01/26/2016   Lipid Panel    Component Value Date/Time   CHOL 176 04/15/2016 0634   TRIG 68 04/15/2016 0634   HDL 47 04/15/2016 0634   CHOLHDL 3.7 04/15/2016 0634   VLDL 14 04/15/2016 0634   LDLCALC 115* 04/15/2016 0634   HgbA1C  Lab Results  Component Value Date   HGBA1C >15.5* 04/15/2016   Cardiac Panel (last 3 results) No results for input(s): CKTOTAL, CKMB, TROPONINI, RELINDX in the last 72 hours. Urinalysis    Component Value Date/Time   COLORURINE STRAW* 05-13-16 0437   APPEARANCEUR CLEAR 2016-05-13 0437   LABSPEC 1.035* May 13, 2016 0437   PHURINE 5.5  May 13, 2016 0437   GLUCOSEU >1000* May 13, 2016 0437   HGBUR NEGATIVE 05/13/2016 0437   BILIRUBINUR NEGATIVE 05-13-2016 0437   BILIRUBINUR negative 01/05/2016 1124   KETONESUR NEGATIVE 05-13-16 0437   PROTEINUR NEGATIVE 05-13-2016 0437   PROTEINUR 100 01/05/2016 1124   UROBILINOGEN 0.2 01/05/2016 1124   UROBILINOGEN 1.0 12/19/2014 1702   NITRITE NEGATIVE 05-13-16 0437   NITRITE negative 01/05/2016 1124   LEUKOCYTESUR NEGATIVE May 13, 2016 0437   Urine Drug Screen     Component Value Date/Time   LABOPIA NONE DETECTED 2016/05/13 0249   COCAINSCRNUR POSITIVE* May 13, 2016 0249   LABBENZ NONE DETECTED 05/13/2016 0249   AMPHETMU NONE DETECTED 05/13/2016 0249   THCU NONE DETECTED 05/13/2016 0249   LABBARB NONE DETECTED May 13, 2016 0249    Alcohol Level No results found for: [facility]   SIGNIFICANT DIAGNOSTIC STUDIES   Ct Head Wo Contrast May 13, 2016  1. Widespread new abnormal right MCA territory gyral edema with dense post intervention hyperenhancement +/- parenchymal hemorrhage.  2. Associated new intracranial mass effect with mild effacement of the right lateral ventricle and 2-3 mm of leftward midline shift.   May 13, 2016  No acute intracranial hemorrhage. Focal high attenuation in the M1 segment of the right MCA with possible mild edema in the right centrum semiovale. Findings concerning for right MCA thrombus and possible right MCA territory acute ischemia. Right occipital old infarct and encephalomalacia.   Ct Angio Head & Neck W Or Wo Contrast 05/13/2016 Extremely limited assessment due to technical difficulties. Non angiographic  phase. In addition, streak artifact from bulky calcified stylohyoid ligaments limits evaluation of the carotid bifurcations . RIGHT internal artery occlusion to the level of the carotid terminus with minimal contrast within RIGHT anterior and middle cerebral artery. Bilateral pleural effusions.   Ct Cervical Spine Wo Contrast 2016-04-25  No  acute/traumatic cervical spine pathology.    Cerebral angiogram 2016/04/25 Bilateral common carotid arteriograms and RT vert artery angiogram followed by endovascular complete revascularizationof occluded RT ICA prox and distally at the terminus ,the RT MCA and RT ACA with x 1pass with Solitaire FR 4 mmx 40 mm Retrieval device and 16m,g of IA integrelin intracranially..Complete revascularization of RT ICA RT MCA TICI 2b revascularization Also noted scattered smooth areas of narrowing of MCA and ACA branches ?vasospasm chemical related   MRI brain 04-26-16 1. Large confluent ischemic infarct involving nearly the entirety of the right MCA territory with associated petechial hemorrhage throughout the area of infarction. 2. Markedly worsened right to left shift now measuring up to 16 mm with right uncal and parafalcine herniation. Findings suspicious for ventricular trapping involving the left lateral ventricle. 3. Additional ischemic infarcts involving the right ACA territory and bilateral PCA territories, including the midbrain and pons as well as the medial left thalamus, likely related to herniation.  4. Small volume patchy acute left MCA territory infarcts, with additional tiny right cerebellar infarct.   NM BRAIN SCAN WITH FLOW - 4+ VIEW 04/16/2016 Absent intracranial blood flow and absent delayed localization of Ceretec consistent with brain death.   Dg Chest Port 1 View 04-26-2016 Endotracheal tube remains in good position. Persistent right lower lobe opacity most consistent with right lower lobe partial collapse. The right heart border is well outlined by air density raising the possibility of basilar pneumothorax. However, no pleural reflection is identified and this is a semi upright view. This appearance could also be seen with right lower lobe collapse. Continued follow-up is suggested. 04/25/2016 1. Mild right perihilar opacity raises question for pneumonia. 2. Known small  right and trace left pleural effusions are not well seen on this study. 3. Mild cardiomegaly.    TTE  - Left ventricle: Inferior wall hypokineiss apical and septal akinesis The cavity size was moderately dilated. Wall thickness was normal. Systolic function was moderately to severely reduced. The estimated ejection fraction was in the range of 30% to 35%. Left ventricular diastolic function parameters were normal. - Mitral valve: Eccentric ischemic MR. There was severe regurgitation. - Left atrium: The atrium was moderately to severely dilated. - Right ventricle: The cavity size was mildly dilated. - Right atrium: The atrium was mildly dilated. - Atrial septum: No defect or patent foramen ovale was identified. - Pulmonary arteries: PA peak pressure: 45 mm Hg (S).      HISTORY OF PRESENT ILLNESS AT TIME OF ADMISSION Kyo Cocuzza is a 55 y.o.male with a history of diabetes mellitus, hypertension, hyperlipidemia, atrial fibrillation on Xaralto, coronary artery disease, previous stroke, with tobacco and cocaine abuse, brought to the emergency room in code stroke status following acute onset of weakness involving his left side, as well as numbness and neglect. The patient began complaining of numbness involving his left arm then loss consciousness briefly. When he woke up he was noted to have a left facial droop, garbled speech and inability to move his left extremities. He was last known well at 12:45 AM on the day of admission. A CT scan of his head showed an old right PCA territory stroke. There were no  acute changes, including no intracranial hemorrhage. NIH stroke score was 21. The patient was not deemed a candidate for TPA because he takes [place]. His INR was 1.45. The patient had also been on Plavix 75 mg per day. A CT angiogram was somewhat inconclusive for intracranial large vessel occlusion but indicated right MCA and ACA flow abnormalities, in addition to right ICA occlusion demonstrated on  cervical CTA. The patient's blood sugar was markedly elevated at 724. An V insulin drip was started. Blood pressure was elevated requiring intervention with hydralazine. Urine drug screen was positive for cocaine.   Interventional radiology was consulted and agreed to perform a catheter cerebral angiogram and possible endovascular revascularization procedures, but after blood sugar was under better control. At 4:30 AM patient's blood sugar was 506.  HOSPITAL COURSE  Mr. Kenton Snowdon was a 55 y.o. male with history of diabetes mellitus, hypertension, hyperlipidemia, atrial fibrillation on Xaralto, coronary artery disease, previous stroke and tobacco and cocaine abuse presenting with left sided weakness, numbness and neglect. He did not receive IV t-PA as he was on xarelto. He was taken to the interventional radiology suite where he had complete revascularization of an occluded R ICA terminus and, TICI2b revascularization of the R MCA.  Following intervention the patient was admitted to the neuro intensive care unit for close monitoring.   Stroke: Non-dominant right ICA infarct (entire R MCA, ACA), bilateral PCAs and punctuate L MCA infarcts, embolic due to hx atrial fibrillation non compliance with xarelto vs cardiomyopathy d/t cocaine use vs. Right ICA stenosis/occlusion. S/p revascularization of occluded R ICA terminus, and TICI2b revascularization of R MCA. Developed hemorrhagic transformation and mass effect (cerebral edema) with 4mm R shift with uncal and parafalcine herniation, likely contributing to additional infarcts.   CTA of Head and Neck -  right ICA occlusion with minimal contrast right ACA and MCA.   Post IR CT with widespread right MCA gyral edema with likely dense contrast staining (vs hemorrage). New mass effect with 3 mm left shift.   MRI right ICA infarct (entire R MCA, ACA and some PCA) and L MCA and PCA infarcts. hemorrhagic transformation. 82mm R shift with uncal and  parafalcine herniation, ventricular traping.   2D Echo EF 30-35%  LDL 115  HgbA1c - >15.5  UDS cocaine positive  SCDs for VTE prophylaxis  Diet NPO time specified  clopidogrel 75 mg daily and Xarelto (rivaroxaban) daily prior to admission, now on No antithrombotic due to hemorrhagic transformation on MRI.   DNR  Fiance considering comfort care based on progress over the next few days.  Brain herniation  Left right MCA and ACA, b/l PCA infarcts  Significant RTL midline shift  B/l PCA infarct likely due to uncal herniation  Unequal pupil and lack of brainstem reflexes  NM BRAIN SCAN WITH FLOW - 4+ VIEW - 04/16/2016 Absent intracranial blood flow and absent delayed localization of Ceretec consistent with brain death.  Respiratory failure  Intubated   Off propofol for 24 hours now  No breathing over the vent  Diabetes insipidus  UOP improved over night  Na 164 -> 156  On desmopressin  CCM managing  D5 infusion and fluid resuscitation  Atrial Fibrillation  Home anticoagulation: Xarelto (rivaroxaban) daily   Likely noncompliance  Currently off AC due to hemorrhagic transformation  Cardiomyopathy   EF 30-35%  Follows with cardiology for CHF  Poor prognosis due to continued cocaine use as per cardiology notes  On Xarelto at home but likely noncompliance  CAD  s/p CABG and stent  MI, CABG 02/2015, stent 10/2015  Follows with cardiology  On plavix  plavix on hold due to hemorrhagic transformation  Diabetes, poorly controlled  Glucose > 600 in ED  Off insulin gtt now  HgbA1c > 15.5, goal < 7.0  On lantus  SSI  CCM on board  Hypertensive emergency hypotension  BP 149/124 on arrival in setting of neurologic symptoms  Started on cardene for BP control, now off  SBP goal 120-140 post IR   BP low and put on neo  Hyperlipidemia  Home meds: lipitor 80  LDL 115, goal < 70  Resumed lipitor 80  Other  Stroke Risk Factors  Cigarette smoker  Chronic Cocaine abuse, UDS positive this admission  Family hx stroke (brother)  PVD  Obstructive sleep apnea  Other Active Problems  Chronic kidney disease stage II 1.04  Hematemesis in the ER on pepcid  Hypokalemia  Hypernatremia 164  Hyperchloridemia   Hypophosphatemia  Neurogenic shock   DISCHARGE EXAM (04/17/2016) Blood pressure 86/53, pulse 105, temperature 99 F (37.2 C), temperature source Core (Comment), resp. rate 14, height 5\' 9"  (1.753 m), weight 78.7 kg (173 lb 8 oz), SpO2 97 %.   PHYSICAL EXAM: Exam is stable today, consistent with brain death. General - Well nourished, well developed, intubated and on sedation.  Ophthalmologic - Fundi not visualized due to ET positioning.  Cardiovascular - Regular rate and rhythm.  Neuro - intubated and off sedation since yesterday, not opening eyes or following any commands. Eyes middline position, no doll's eyes, pupils 3 mm on the right and 3.92mm on the left, not responsive to light, no corneal, no gag or cough and not breathing over the vent. On pain stimulation, no withdraw in all 4 extremities. No babinski and DTR diminished. Sensation, coordination or gait not tested.    A Palliative Care meeting had been scheduled with the family for June 28th. A discussion was held with his fiance at the bedside. Terminal extubation was planned for 3 PM on 04/09/2016.  On 04/22/2016 at approximately 4:30 AM the patient developed asystole on the cardiac monitor. Following further examination he was subsequently pronounced dead. The chaplain was notified and attempts were made to contact the family.    Discharge Disposition:  Deceased   30 minutes were spent preparing discharge.  04/20/2016 PA-C [facility] Pager [phone] 04/13/2016, 4:01 PM

## 2016-04-24 DEATH — deceased

## 2016-04-26 ENCOUNTER — Telehealth: Payer: Self-pay | Admitting: *Deleted

## 2016-04-26 NOTE — Telephone Encounter (Signed)
I was following up with patient for 43-month phone follow-up for REDS@Discharge  Study.. I realized patient had expired on 03/29/2016 with stroke and also patient had nonSTEMI on 02/06/16 after reviewing patient's medical record for study.

## 2016-04-30 NOTE — Telephone Encounter (Signed)
Rn call [facility]. Rn stated Death certificate was ready for pick up. Staff stated they will send someone there to pick up.

## 2016-11-19 IMAGING — CT CT ANGIO CHEST
2 of 6 series · 19 of 36 positions shown · IV contrast (OMNIPAQUE 350)
Comparison: 11/13/2015

CLINICAL DATA: Shortness of breath

EXAM:
CT ANGIOGRAPHY CHEST WITH CONTRAST
TECHNIQUE: Multidetector CT imaging of the chest was performed using the
standard protocol during bolus administration of intravenous
contrast. Multiplanar CT image reconstructions and MIPs were
obtained to evaluate the vascular anatomy.
CONTRAST:  100mL OMNIPAQUE IOHEXOL 350 MG/ML SOLN

[Series 6: thins for pacs · axial · 0.74mm/px · z∈[-353,-80]mm · 18 of 305 slices shown]
[im 16/305  lung]
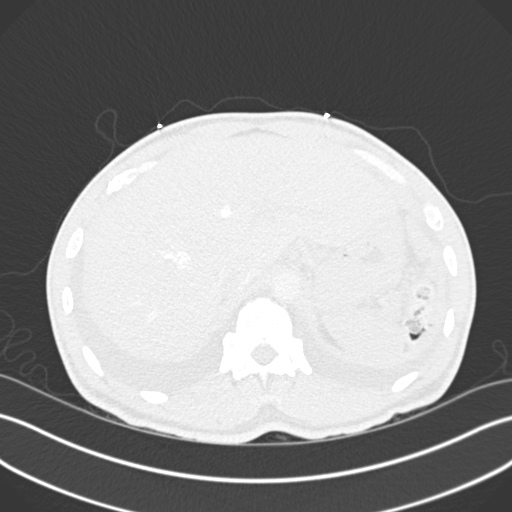
[im 31/305  mediastinal]
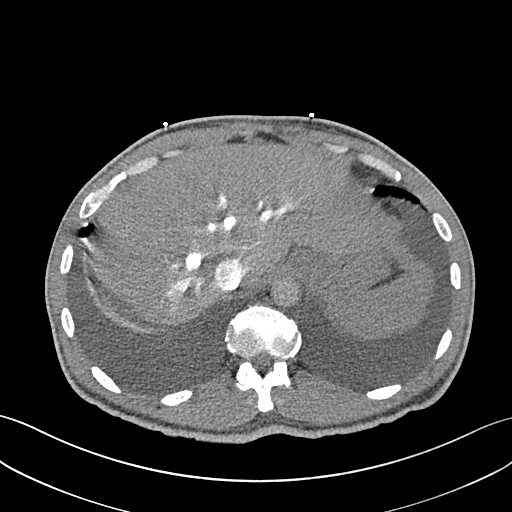
[im 46/305  lung]
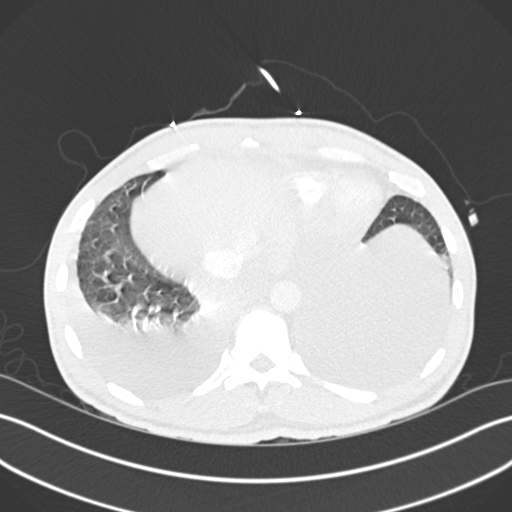
[im 61/305  mediastinal]
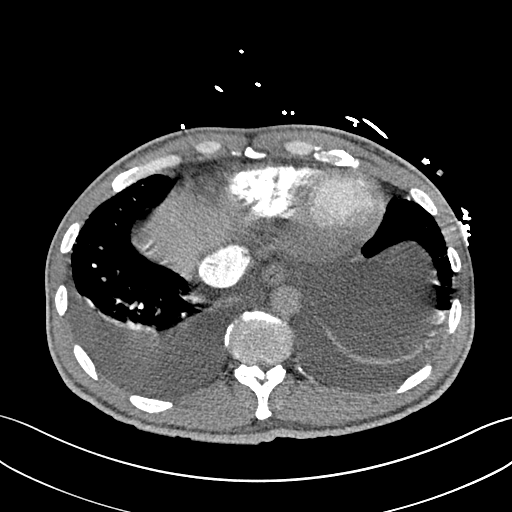
[im 77/305  lung]
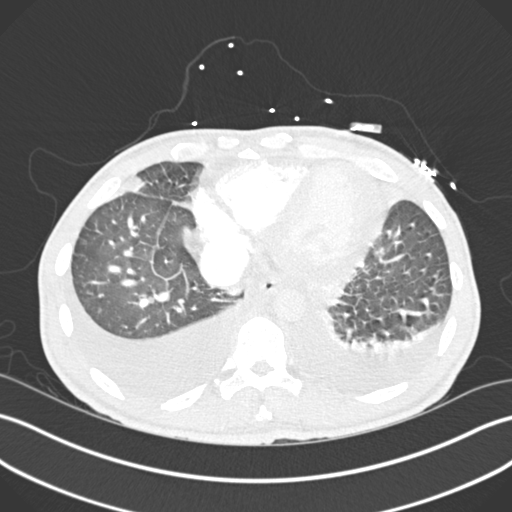
[im 92/305  mediastinal]
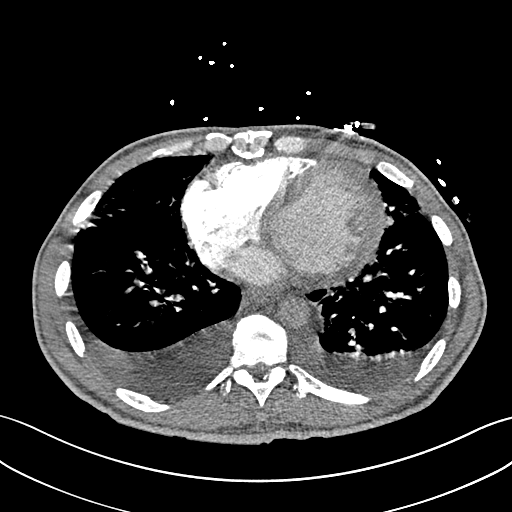
[im 107/305  lung]
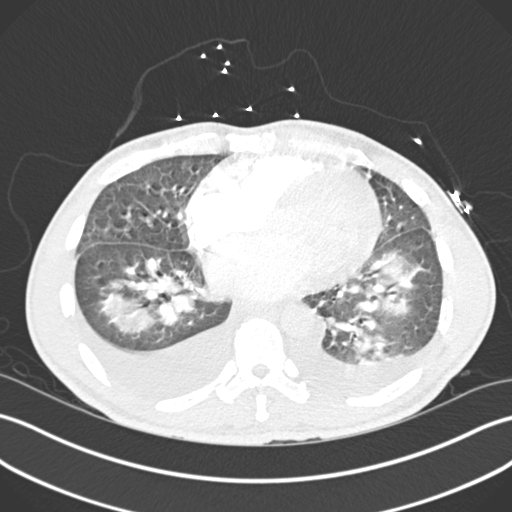
[im 122/305  mediastinal]
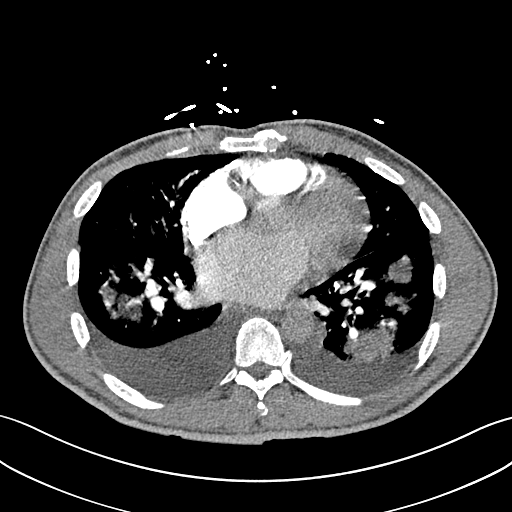
[im 137/305  lung]
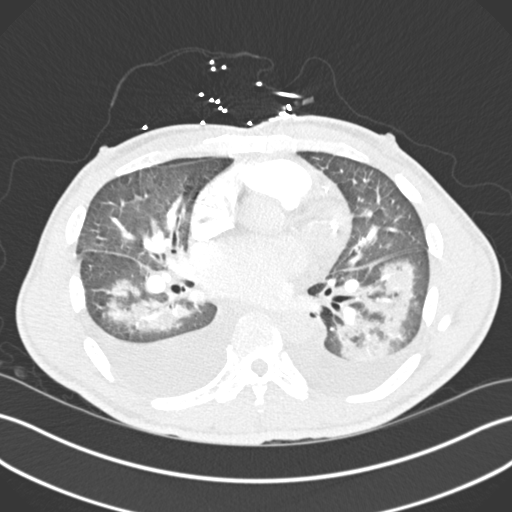
[im 168/305  mediastinal]
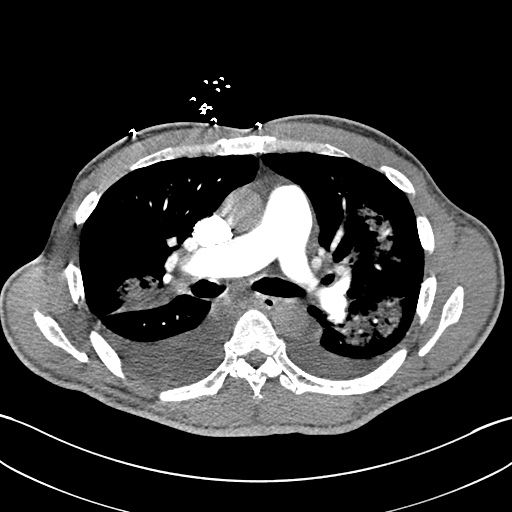
[im 183/305  lung]
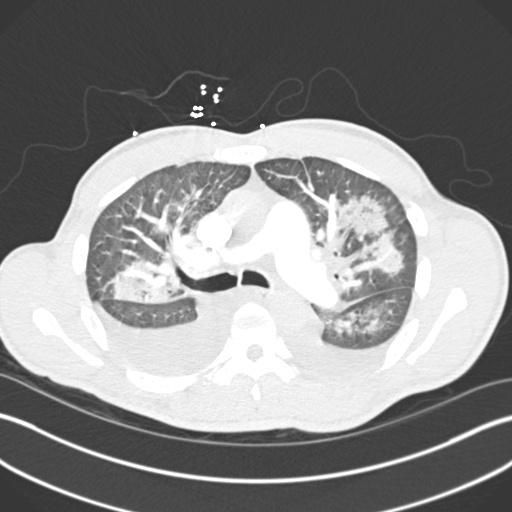
[im 198/305  mediastinal]
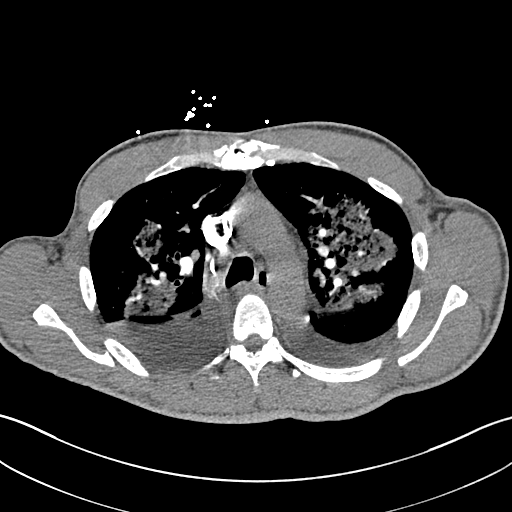
[im 213/305  lung]
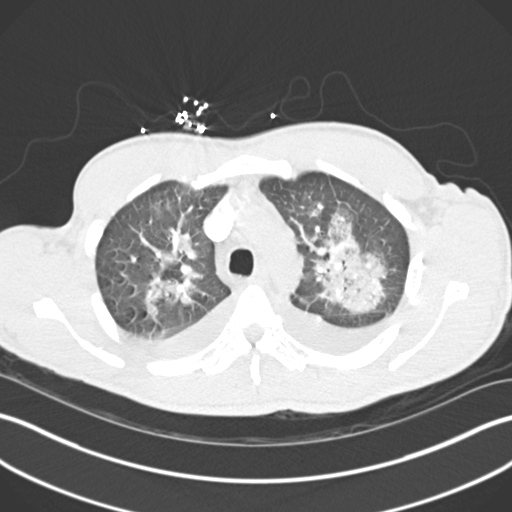
[im 229/305  mediastinal]
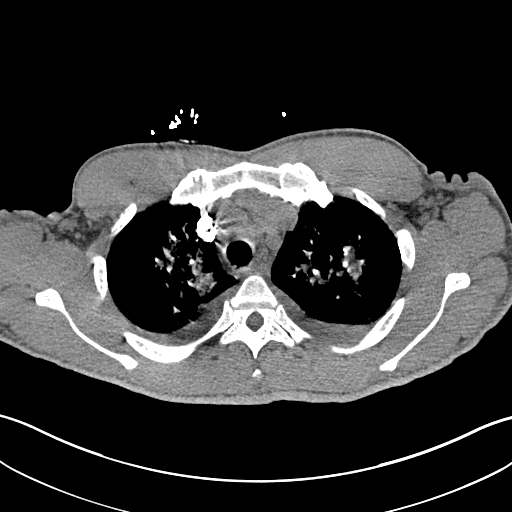
[im 244/305  lung]
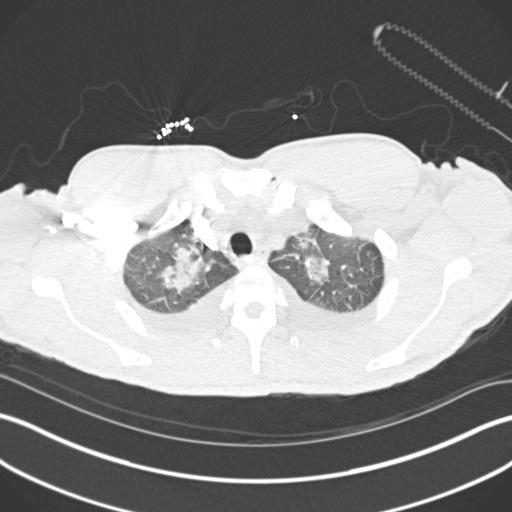
[im 259/305  mediastinal]
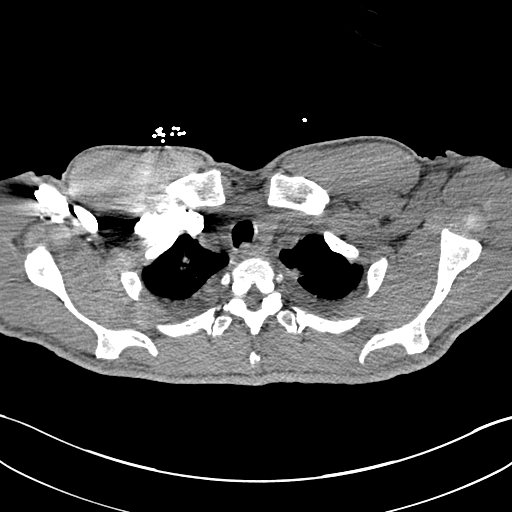
[im 274/305  lung]
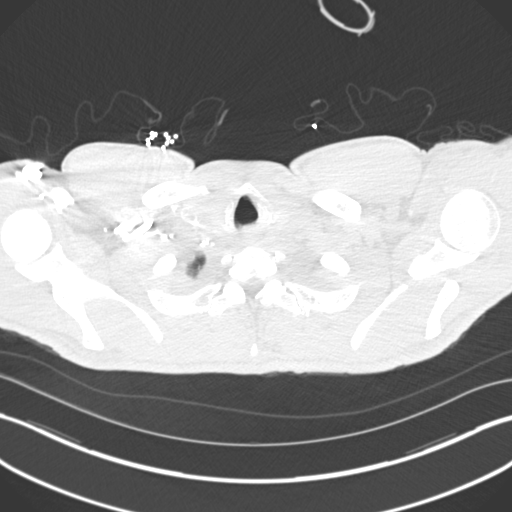
[im 289/305  mediastinal]
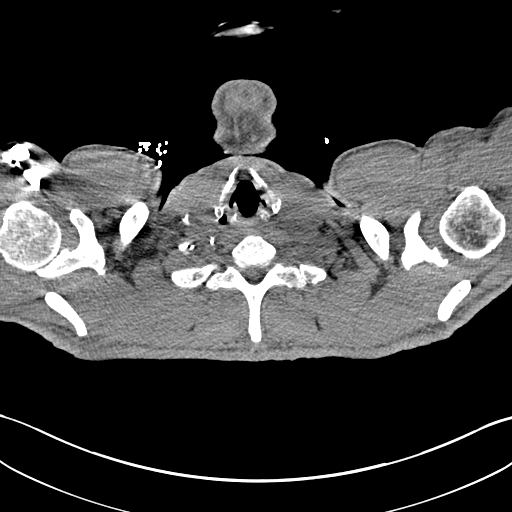

[Series 8: coronal mpr · coronal · 0.64mm/px · 1 of 121 slices shown]
[im 61/121  mediastinal]
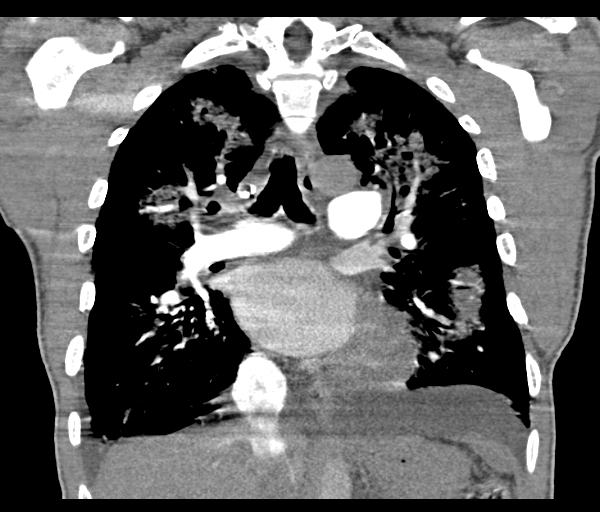

[19 of 36 positions shown; findings below may reference images not displayed]

FINDINGS: THORACIC INLET/BODY WALL:

No acute abnormality.

MEDIASTINUM:

Cardiomegaly with dilated left ventricle. Atherosclerosis, including
the coronary arteries with previous left circulation stenting. No
systemic arterial enhancement; no evidence of acute aortic syndrome.

CTA of the pulmonary arteries is limited by respiratory motion, best
obtainable given patient's clinical circumstances (on BiPAP). When
allowing for distortion by motion, there is no visualized pulmonary
embolism.

LUNG WINDOWS:

There is symmetric bilateral perihilar airspace opacity with diffuse
interlobular septal thickening and moderate layering pleural
effusions. The pattern is consistent with pulmonary edema. In light
of recent diagnosis of pneumonia, there could be superimposed
airspace infection. No history of hemoptysis. Clear central airways.

UPPER ABDOMEN:

No acute findings.

OSSEOUS:

No acute fracture.  No suspicious lytic or blastic lesions.

Review of the MIP images confirms the above findings.
IMPRESSION: 1. CHF pattern, including moderate pleural effusions.
2. Negative for pulmonary embolism.
3. Especially given recent admission for pneumonia, there may be
superimposed airway infection.
